# Patient Record
Sex: Female | Born: 1980 | Race: Black or African American | Hispanic: No | Marital: Married | Smoking: Never smoker
Health system: Southern US, Community
[De-identification: ages and names within clinical notes are randomized; demographics above are authoritative.]

## PROBLEM LIST (undated history)

## (undated) DIAGNOSIS — D649 Anemia, unspecified: Secondary | ICD-10-CM

## (undated) DIAGNOSIS — A6000 Herpesviral infection of urogenital system, unspecified: Secondary | ICD-10-CM

## (undated) DIAGNOSIS — E669 Obesity, unspecified: Secondary | ICD-10-CM

## (undated) DIAGNOSIS — O24419 Gestational diabetes mellitus in pregnancy, unspecified control: Secondary | ICD-10-CM

## (undated) DIAGNOSIS — R7303 Prediabetes: Secondary | ICD-10-CM

## (undated) DIAGNOSIS — E079 Disorder of thyroid, unspecified: Secondary | ICD-10-CM

## (undated) HISTORY — DX: Obesity, unspecified: E66.9

## (undated) HISTORY — PX: SALIVARY GLAND SURGERY: SHX768

## (undated) HISTORY — DX: Herpesviral infection of urogenital system, unspecified: A60.00

## (undated) HISTORY — DX: Anemia, unspecified: D64.9

## (undated) HISTORY — DX: Disorder of thyroid, unspecified: E07.9

---
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
## 2000-07-09 ENCOUNTER — Emergency Department (HOSPITAL_COMMUNITY): Admission: EM | Admit: 2000-07-09 | Discharge: 2000-07-09 | Payer: Self-pay | Admitting: Emergency Medicine

## 2001-02-28 ENCOUNTER — Other Ambulatory Visit: Admission: RE | Admit: 2001-02-28 | Discharge: 2001-02-28 | Payer: Self-pay | Admitting: Obstetrics and Gynecology

## 2002-12-13 ENCOUNTER — Emergency Department (HOSPITAL_COMMUNITY): Admission: EM | Admit: 2002-12-13 | Discharge: 2002-12-13 | Payer: Self-pay | Admitting: *Deleted

## 2003-02-05 ENCOUNTER — Other Ambulatory Visit: Admission: RE | Admit: 2003-02-05 | Discharge: 2003-02-05 | Payer: Self-pay | Admitting: Internal Medicine

## 2003-02-05 ENCOUNTER — Other Ambulatory Visit: Admission: RE | Admit: 2003-02-05 | Discharge: 2003-02-05 | Payer: Self-pay | Admitting: Obstetrics and Gynecology

## 2003-02-19 ENCOUNTER — Other Ambulatory Visit: Admission: RE | Admit: 2003-02-19 | Discharge: 2003-02-19 | Payer: Self-pay | Admitting: *Deleted

## 2003-04-24 ENCOUNTER — Ambulatory Visit (HOSPITAL_COMMUNITY): Admission: RE | Admit: 2003-04-24 | Discharge: 2003-04-25 | Payer: Self-pay | Admitting: Otolaryngology

## 2003-04-24 ENCOUNTER — Encounter (INDEPENDENT_AMBULATORY_CARE_PROVIDER_SITE_OTHER): Payer: Self-pay | Admitting: *Deleted

## 2004-10-19 ENCOUNTER — Other Ambulatory Visit: Admission: RE | Admit: 2004-10-19 | Discharge: 2004-10-19 | Payer: Self-pay | Admitting: Obstetrics and Gynecology

## 2005-04-10 ENCOUNTER — Ambulatory Visit: Payer: Self-pay | Admitting: Family Medicine

## 2005-08-02 ENCOUNTER — Ambulatory Visit: Payer: Self-pay | Admitting: Family Medicine

## 2005-08-07 ENCOUNTER — Ambulatory Visit: Payer: Self-pay | Admitting: Family Medicine

## 2005-09-12 ENCOUNTER — Ambulatory Visit: Payer: Self-pay | Admitting: Internal Medicine

## 2005-10-16 ENCOUNTER — Ambulatory Visit: Payer: Self-pay | Admitting: Family Medicine

## 2006-05-01 ENCOUNTER — Ambulatory Visit: Payer: Self-pay | Admitting: Family Medicine

## 2006-05-16 ENCOUNTER — Ambulatory Visit: Payer: Self-pay | Admitting: Family Medicine

## 2006-05-30 ENCOUNTER — Ambulatory Visit: Payer: Self-pay | Admitting: Family Medicine

## 2006-06-13 ENCOUNTER — Ambulatory Visit: Payer: Self-pay | Admitting: Family Medicine

## 2006-07-10 ENCOUNTER — Ambulatory Visit: Payer: Self-pay | Admitting: Family Medicine

## 2006-07-17 ENCOUNTER — Encounter: Admission: RE | Admit: 2006-07-17 | Discharge: 2006-07-17 | Payer: Self-pay | Admitting: Family Medicine

## 2006-08-06 ENCOUNTER — Ambulatory Visit: Payer: Self-pay | Admitting: Family Medicine

## 2006-09-18 HISTORY — PX: KELOID EXCISION: SHX1856

## 2006-11-05 DIAGNOSIS — D509 Iron deficiency anemia, unspecified: Secondary | ICD-10-CM | POA: Insufficient documentation

## 2006-11-05 DIAGNOSIS — J45909 Unspecified asthma, uncomplicated: Secondary | ICD-10-CM | POA: Insufficient documentation

## 2006-11-05 DIAGNOSIS — K119 Disease of salivary gland, unspecified: Secondary | ICD-10-CM | POA: Insufficient documentation

## 2006-11-15 ENCOUNTER — Ambulatory Visit: Payer: Self-pay | Admitting: Family Medicine

## 2007-03-11 ENCOUNTER — Ambulatory Visit: Payer: Self-pay | Admitting: Family Medicine

## 2007-03-11 DIAGNOSIS — Z6841 Body Mass Index (BMI) 40.0 and over, adult: Secondary | ICD-10-CM

## 2007-10-03 ENCOUNTER — Telehealth (INDEPENDENT_AMBULATORY_CARE_PROVIDER_SITE_OTHER): Payer: Self-pay | Admitting: Family Medicine

## 2008-02-24 ENCOUNTER — Ambulatory Visit: Payer: Self-pay | Admitting: Family Medicine

## 2008-02-24 DIAGNOSIS — N390 Urinary tract infection, site not specified: Secondary | ICD-10-CM | POA: Insufficient documentation

## 2008-02-24 LAB — CONVERTED CEMR LAB
Beta hcg, urine, semiquantitative: NEGATIVE
Bilirubin Urine: NEGATIVE
Blood in Urine, dipstick: NEGATIVE
Glucose, Urine, Semiquant: NEGATIVE
Ketones, urine, test strip: NEGATIVE
Nitrite: NEGATIVE
Protein, U semiquant: 30
Specific Gravity, Urine: 1.01
Urobilinogen, UA: NEGATIVE
WBC Urine, dipstick: NEGATIVE
pH: 5

## 2008-02-25 ENCOUNTER — Encounter: Payer: Self-pay | Admitting: Family Medicine

## 2008-02-26 ENCOUNTER — Encounter (INDEPENDENT_AMBULATORY_CARE_PROVIDER_SITE_OTHER): Payer: Self-pay | Admitting: *Deleted

## 2008-02-27 ENCOUNTER — Telehealth: Payer: Self-pay | Admitting: Family Medicine

## 2008-07-02 ENCOUNTER — Other Ambulatory Visit: Admission: RE | Admit: 2008-07-02 | Discharge: 2008-07-02 | Payer: Self-pay | Admitting: Family Medicine

## 2008-07-02 ENCOUNTER — Ambulatory Visit: Payer: Self-pay | Admitting: Family Medicine

## 2008-07-02 ENCOUNTER — Encounter: Payer: Self-pay | Admitting: Family Medicine

## 2008-07-02 LAB — CONVERTED CEMR LAB
Beta hcg, urine, semiquantitative: NEGATIVE
Bilirubin Urine: NEGATIVE
Blood in Urine, dipstick: NEGATIVE
Glucose, Urine, Semiquant: NEGATIVE
Ketones, urine, test strip: NEGATIVE
Nitrite: NEGATIVE
Protein, U semiquant: NEGATIVE
Specific Gravity, Urine: 1.02
Urobilinogen, UA: NEGATIVE
WBC Urine, dipstick: NEGATIVE
pH: 6.5

## 2008-07-03 ENCOUNTER — Encounter (INDEPENDENT_AMBULATORY_CARE_PROVIDER_SITE_OTHER): Payer: Self-pay | Admitting: *Deleted

## 2008-07-03 LAB — CONVERTED CEMR LAB

## 2008-07-06 LAB — CONVERTED CEMR LAB
ALT: 10 units/L (ref 0–35)
AST: 10 units/L (ref 0–37)
Albumin: 3.3 g/dL — ABNORMAL LOW (ref 3.5–5.2)
Alkaline Phosphatase: 122 units/L — ABNORMAL HIGH (ref 39–117)
BUN: 3 mg/dL — ABNORMAL LOW (ref 6–23)
Basophils Absolute: 0.1 10*3/uL (ref 0.0–0.1)
Basophils Relative: 0.6 % (ref 0.0–3.0)
Bilirubin, Direct: 0.1 mg/dL (ref 0.0–0.3)
CO2: 29 meq/L (ref 19–32)
Calcium: 8.9 mg/dL (ref 8.4–10.5)
Chloride: 105 meq/L (ref 96–112)
Cholesterol: 139 mg/dL (ref 0–200)
Creatinine, Ser: 0.7 mg/dL (ref 0.4–1.2)
Eosinophils Absolute: 0.1 10*3/uL (ref 0.0–0.7)
Eosinophils Relative: 0.9 % (ref 0.0–5.0)
GFR calc Af Amer: 129 mL/min
GFR calc non Af Amer: 107 mL/min
Glucose, Bld: 88 mg/dL (ref 70–99)
HCT: 38.3 % (ref 36.0–46.0)
HDL: 54.4 mg/dL (ref >39.0)
Hemoglobin: 12.3 g/dL (ref 12.0–15.0)
Iron: 96 ug/dL (ref 42–145)
LDL Cholesterol: 71 mg/dL (ref 0–99)
Lymphocytes Relative: 18.4 % (ref 12.0–46.0)
MCHC: 32.1 g/dL (ref 30.0–36.0)
MCV: 70.3 fL — ABNORMAL LOW (ref 78.0–100.0)
Monocytes Absolute: 0.7 10*3/uL (ref 0.1–1.0)
Monocytes Relative: 5.7 % (ref 3.0–12.0)
Neutro Abs: 9.6 10*3/uL — ABNORMAL HIGH (ref 1.4–7.7)
Neutrophils Relative %: 74.4 % (ref 43.0–77.0)
Platelets: 268 10*3/uL (ref 150–400)
Potassium: 3.4 meq/L — ABNORMAL LOW (ref 3.5–5.1)
RBC: 5.45 M/uL — ABNORMAL HIGH (ref 3.87–5.11)
RDW: 14.3 % (ref 11.5–14.6)
Saturation Ratios: 24.3 % (ref 20.0–50.0)
Sodium: 141 meq/L (ref 135–145)
TSH: 2.12 microintl units/mL (ref 0.35–5.50)
Total Bilirubin: 0.7 mg/dL (ref 0.3–1.2)
Total CHOL/HDL Ratio: 2.6
Total Protein: 7.4 g/dL (ref 6.0–8.3)
Transferrin: 282.6 mg/dL (ref >=212.0)
Triglycerides: 69 mg/dL (ref 0–149)
VLDL: 14 mg/dL (ref 0–40)
WBC: 12.9 10*3/uL — ABNORMAL HIGH (ref 4.5–10.5)

## 2008-07-07 ENCOUNTER — Telehealth (INDEPENDENT_AMBULATORY_CARE_PROVIDER_SITE_OTHER): Payer: Self-pay | Admitting: *Deleted

## 2008-07-14 ENCOUNTER — Ambulatory Visit: Payer: Self-pay | Admitting: Family Medicine

## 2008-07-17 ENCOUNTER — Telehealth (INDEPENDENT_AMBULATORY_CARE_PROVIDER_SITE_OTHER): Payer: Self-pay | Admitting: *Deleted

## 2008-07-17 LAB — CONVERTED CEMR LAB
Basophils Absolute: 0 10*3/uL (ref 0.0–0.1)
Basophils Relative: 0.1 % (ref 0.0–3.0)
Eosinophils Absolute: 0.1 10*3/uL (ref 0.0–0.7)
Eosinophils Relative: 1 % (ref 0.0–5.0)
HCT: 36.1 % (ref 36.0–46.0)
Hemoglobin: 11.8 g/dL — ABNORMAL LOW (ref 12.0–15.0)
Lymphocytes Relative: 23.1 % (ref 12.0–46.0)
MCHC: 32.7 g/dL (ref 30.0–36.0)
MCV: 69.9 fL — ABNORMAL LOW (ref 78.0–100.0)
Monocytes Absolute: 0.8 10*3/uL (ref 0.1–1.0)
Monocytes Relative: 5.8 % (ref 3.0–12.0)
Neutro Abs: 9.3 10*3/uL — ABNORMAL HIGH (ref 1.4–7.7)
Neutrophils Relative %: 70 % (ref 43.0–77.0)
Platelets: 274 10*3/uL (ref 150–400)
RBC: 5.16 M/uL — ABNORMAL HIGH (ref 3.87–5.11)
RDW: 13.9 % (ref 11.5–14.6)
WBC: 13.3 10*3/uL — ABNORMAL HIGH (ref 4.5–10.5)

## 2008-08-04 ENCOUNTER — Ambulatory Visit: Payer: Self-pay | Admitting: Family Medicine

## 2008-08-27 ENCOUNTER — Telehealth: Payer: Self-pay | Admitting: Family Medicine

## 2008-08-27 DIAGNOSIS — D72829 Elevated white blood cell count, unspecified: Secondary | ICD-10-CM | POA: Insufficient documentation

## 2008-08-27 LAB — CONVERTED CEMR LAB
Basophils Absolute: 0 10*3/uL (ref 0.0–0.1)
Basophils Relative: 0.1 % (ref 0.0–3.0)
Eosinophils Absolute: 0.2 10*3/uL (ref 0.0–0.7)
Eosinophils Relative: 1.3 % (ref 0.0–5.0)
HCT: 38.6 % (ref 36.0–46.0)
Hemoglobin: 12.5 g/dL (ref 12.0–15.0)
Lymphocytes Relative: 22.5 % (ref 12.0–46.0)
MCHC: 32.4 g/dL (ref 30.0–36.0)
MCV: 69.9 fL — ABNORMAL LOW (ref 78.0–100.0)
Monocytes Absolute: 0.1 10*3/uL (ref 0.1–1.0)
Monocytes Relative: 0.9 % — ABNORMAL LOW (ref 3.0–12.0)
Neutro Abs: 10.3 10*3/uL — ABNORMAL HIGH (ref 1.4–7.7)
Neutrophils Relative %: 75.2 % (ref 43.0–77.0)
Platelets: 289 10*3/uL (ref 150–400)
RBC: 5.53 M/uL — ABNORMAL HIGH (ref 3.87–5.11)
RDW: 13.7 % (ref 11.5–14.6)
WBC: 13.7 10*3/uL — ABNORMAL HIGH (ref 4.5–10.5)

## 2008-08-31 ENCOUNTER — Ambulatory Visit: Payer: Self-pay | Admitting: Hematology and Oncology

## 2008-08-31 ENCOUNTER — Encounter: Payer: Self-pay | Admitting: Family Medicine

## 2008-09-04 ENCOUNTER — Encounter: Payer: Self-pay | Admitting: Family Medicine

## 2008-09-21 ENCOUNTER — Ambulatory Visit: Payer: Self-pay | Admitting: Family Medicine

## 2008-09-21 DIAGNOSIS — R030 Elevated blood-pressure reading, without diagnosis of hypertension: Secondary | ICD-10-CM | POA: Insufficient documentation

## 2008-09-24 ENCOUNTER — Ambulatory Visit (HOSPITAL_COMMUNITY): Admission: RE | Admit: 2008-09-24 | Discharge: 2008-09-24 | Payer: Self-pay | Admitting: Hematology and Oncology

## 2008-10-02 ENCOUNTER — Encounter: Payer: Self-pay | Admitting: Family Medicine

## 2008-10-09 ENCOUNTER — Ambulatory Visit (HOSPITAL_COMMUNITY): Admission: RE | Admit: 2008-10-09 | Discharge: 2008-10-09 | Payer: Self-pay | Admitting: Hematology and Oncology

## 2009-06-03 ENCOUNTER — Ambulatory Visit: Payer: Self-pay | Admitting: Hematology & Oncology

## 2009-07-13 ENCOUNTER — Ambulatory Visit: Payer: Self-pay | Admitting: Family Medicine

## 2009-07-13 DIAGNOSIS — I517 Cardiomegaly: Secondary | ICD-10-CM | POA: Insufficient documentation

## 2009-07-22 ENCOUNTER — Ambulatory Visit (HOSPITAL_COMMUNITY): Admission: RE | Admit: 2009-07-22 | Discharge: 2009-07-22 | Payer: Self-pay | Admitting: Family Medicine

## 2009-07-22 ENCOUNTER — Ambulatory Visit: Payer: Self-pay

## 2009-07-22 ENCOUNTER — Ambulatory Visit: Payer: Self-pay | Admitting: Hematology & Oncology

## 2009-07-22 ENCOUNTER — Ambulatory Visit: Payer: Self-pay | Admitting: Internal Medicine

## 2009-07-22 ENCOUNTER — Encounter: Payer: Self-pay | Admitting: Family Medicine

## 2009-07-23 ENCOUNTER — Encounter: Payer: Self-pay | Admitting: Family Medicine

## 2009-07-23 ENCOUNTER — Ambulatory Visit: Payer: Self-pay | Admitting: Cardiovascular Disease

## 2009-07-23 DIAGNOSIS — E049 Nontoxic goiter, unspecified: Secondary | ICD-10-CM | POA: Insufficient documentation

## 2009-07-23 LAB — CBC WITH DIFFERENTIAL (CANCER CENTER ONLY)
BASO#: 0.1 10*3/uL (ref 0.0–0.2)
BASO%: 0.6 % (ref 0.0–2.0)
EOS%: 2.7 % (ref 0.0–7.0)
Eosinophils Absolute: 0.3 10*3/uL (ref 0.0–0.5)
HCT: 34.7 % — ABNORMAL LOW (ref 34.8–46.6)
HGB: 11.8 g/dL (ref 11.6–15.9)
LYMPH#: 3 10*3/uL (ref 0.9–3.3)
LYMPH%: 24.1 % (ref 14.0–48.0)
MCH: 22.9 pg — ABNORMAL LOW (ref 26.0–34.0)
MCHC: 34 g/dL (ref 32.0–36.0)
MCV: 68 fL — ABNORMAL LOW (ref 81–101)
MONO#: 0.6 10*3/uL (ref 0.1–0.9)
MONO%: 4.8 % (ref 0.0–13.0)
NEUT#: 8.4 10*3/uL — ABNORMAL HIGH (ref 1.5–6.5)
NEUT%: 67.8 % (ref 39.6–80.0)
Platelets: 321 10*3/uL (ref 145–400)
RBC: 5.14 10*6/uL (ref 3.70–5.32)
RDW: 15.5 % — ABNORMAL HIGH (ref 10.5–14.6)
WBC: 12.4 10*3/uL — ABNORMAL HIGH (ref 3.9–10.0)

## 2009-07-23 LAB — BASIC METABOLIC PANEL
BUN: 7 mg/dL (ref 6–23)
CO2: 26 mEq/L (ref 19–32)
Calcium: 8.6 mg/dL (ref 8.4–10.5)
Chloride: 107 mEq/L (ref 96–112)
Creatinine, Ser: 0.71 mg/dL (ref 0.40–1.20)
Glucose, Bld: 89 mg/dL (ref 70–99)
Potassium: 3.7 mEq/L (ref 3.5–5.3)
Sodium: 142 mEq/L (ref 135–145)

## 2009-10-12 ENCOUNTER — Ambulatory Visit: Payer: Self-pay | Admitting: Family Medicine

## 2009-10-12 DIAGNOSIS — R300 Dysuria: Secondary | ICD-10-CM | POA: Insufficient documentation

## 2009-10-12 DIAGNOSIS — M545 Low back pain, unspecified: Secondary | ICD-10-CM | POA: Insufficient documentation

## 2009-10-12 LAB — CONVERTED CEMR LAB
Bilirubin Urine: NEGATIVE
Blood in Urine, dipstick: NEGATIVE
Glucose, Urine, Semiquant: NEGATIVE
Ketones, urine, test strip: NEGATIVE
Nitrite: NEGATIVE
Protein, U semiquant: NEGATIVE
Specific Gravity, Urine: 1.015
Urobilinogen, UA: 0.2
WBC Urine, dipstick: NEGATIVE
pH: 6

## 2009-10-13 ENCOUNTER — Encounter: Payer: Self-pay | Admitting: Family Medicine

## 2009-10-14 LAB — CONVERTED CEMR LAB
Bacteria, UA: NONE SEEN
Casts: NONE SEEN /lpf
Crystals: NONE SEEN
RBC / HPF: NONE SEEN (ref <3)
WBC, UA: NONE SEEN cells/hpf (ref <3)

## 2009-12-09 ENCOUNTER — Telehealth (INDEPENDENT_AMBULATORY_CARE_PROVIDER_SITE_OTHER): Payer: Self-pay | Admitting: *Deleted

## 2009-12-14 ENCOUNTER — Ambulatory Visit: Payer: Self-pay | Admitting: Family Medicine

## 2009-12-14 DIAGNOSIS — M25539 Pain in unspecified wrist: Secondary | ICD-10-CM | POA: Insufficient documentation

## 2009-12-17 LAB — CONVERTED CEMR LAB
Free T4: 0.8 ng/dL (ref 0.6–1.6)
T3, Free: 2.8 pg/mL (ref 2.3–4.2)
TSH: 2.19 microintl units/mL (ref 0.35–5.50)

## 2009-12-21 ENCOUNTER — Encounter: Admission: RE | Admit: 2009-12-21 | Discharge: 2009-12-21 | Payer: Self-pay | Admitting: Family Medicine

## 2009-12-21 DIAGNOSIS — E041 Nontoxic single thyroid nodule: Secondary | ICD-10-CM | POA: Insufficient documentation

## 2009-12-28 ENCOUNTER — Ambulatory Visit: Payer: Self-pay | Admitting: Family Medicine

## 2009-12-28 LAB — CONVERTED CEMR LAB
Bilirubin Urine: NEGATIVE
Glucose, Urine, Semiquant: NEGATIVE
Ketones, urine, test strip: NEGATIVE
Nitrite: POSITIVE
Protein, U semiquant: NEGATIVE
Specific Gravity, Urine: 1.02
Urobilinogen, UA: 0.2
WBC Urine, dipstick: NEGATIVE
pH: 6.5

## 2009-12-29 ENCOUNTER — Encounter: Payer: Self-pay | Admitting: Family Medicine

## 2009-12-30 ENCOUNTER — Ambulatory Visit: Payer: Self-pay | Admitting: Hematology & Oncology

## 2009-12-31 ENCOUNTER — Telehealth (INDEPENDENT_AMBULATORY_CARE_PROVIDER_SITE_OTHER): Payer: Self-pay | Admitting: *Deleted

## 2010-01-04 ENCOUNTER — Telehealth: Payer: Self-pay | Admitting: Family Medicine

## 2010-01-10 ENCOUNTER — Encounter: Payer: Self-pay | Admitting: Family Medicine

## 2010-06-27 ENCOUNTER — Encounter: Admission: RE | Admit: 2010-06-27 | Discharge: 2010-06-27 | Payer: Self-pay | Admitting: Otolaryngology

## 2010-10-07 ENCOUNTER — Ambulatory Visit
Admission: RE | Admit: 2010-10-07 | Discharge: 2010-10-07 | Payer: Self-pay | Source: Home / Self Care | Attending: Family Medicine | Admitting: Family Medicine

## 2010-10-07 ENCOUNTER — Encounter: Payer: Self-pay | Admitting: Family Medicine

## 2010-10-09 ENCOUNTER — Encounter: Payer: Self-pay | Admitting: Hematology & Oncology

## 2010-10-18 NOTE — Assessment & Plan Note (Signed)
Summary: Phentermine refill/drb   Vital Signs:  Patient profile:   30 year old female Weight:      264 pounds Pulse rate:   87 / minute Pulse rhythm:   regular BP sitting:   122 / 80  (left arm) Cuff size:   large  Vitals Entered By: Army Fossa CMA (December 14, 2009 3:56 PM) CC: Pt here to start Phentermine again.   History of Present Illness: Pt here to start med for weight loss again.  Pt is exercising again but is not eating right or drinking water.  Pt also c/o L wrist pain after FOOSH last weekend.  Pt went to UC and they told her it was not broken but she still has a lot of pain.  Current Medications (verified): 1)  Diethylpropion Hcl Cr 75 Mg Xr24h-Tab (Diethylpropion Hcl) .[name].. 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 ASTHMA (ICD-493.90) ANEMIA-IRON DEFICIENCY (ICD-280.9)  Past Surgical History: Last updated: 07/02/2008 keloid removed 2008 salivary gland removed secondary to cancer---Dr Molli Barrows  Family History: Last updated: 07/02/2008 Family History of CAD Female 1st degree relative <50 Family History Diabetes 1st degree relative Family History High cholesterol Family History Hypertension Family History of Prostate CA 1st degree relative <50  Social History: Last updated: 07/02/2008 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 Family History of CAD Female 1st degree relative <50 Family History Diabetes 1st degree relative Family History High cholesterol Family History Hypertension Family History of Prostate CA 1st degree relative <50  Social History: Reviewed history from 07/02/2008 and no changes required. 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 Lungs are clear to auscultation, no crackles or wheezes. Heart:  normal rate and no murmur.   Msk:  L wrist swollen medially-- tenderness with palpation Extremities:  No clubbing, cyanosis, edema, or deformity noted with normal full range of motion of all joints.   Psych:  Cognition and judgment appear intact. Alert and cooperative with normal attention span and concentration. No apparent delusions, illusions, hallucinations   Impression & Recommendations:  Problem # 1:  WRIST PAIN, LEFT (ICD-719.43)  Orders: T-Wrist Comp Left Min 3 Views (73110TC) con't to wear splint to ortho if no improvement   Problem # 2:  THYROMEGALY (ICD-240.9)  Orders: Venipuncture (09811) TLB-TSH (Thyroid Stimulating Hormone) (84443-TSH) TLB-T3, Free (Triiodothyronine) (84481-T3FREE) TLB-T4 (Thyrox), Free [phone]) Radiology Referral (Radiology)  Problem # 3:  OBESITY NOS (ICD-278.00) diet and exercise discussed with patient tenuate qam  rto 1 month Orders: Venipuncture (21308) TLB-TSH (Thyroid  Stimulating Hormone) (84443-TSH) TLB-T3, Free (Triiodothyronine) (84481-T3FREE) TLB-T4 (Thyrox), Free (84439-FT4R)  Ht: 66.5 (07/23/2009)  Wt: 264 (12/14/2009)   BMI: 40.77 (07/13/2009)  Complete Medication List: 1)  Diethylpropion Hcl Cr 75 Mg Xr24h-tab (Diethylpropion hcl) .[name].. 1 by mouth qam Prescriptions: DIETHYLPROPION HCL CR 75 MG XR24H-TAB (DIETHYLPROPION HCL) 1 by mouth qam  #30 x 0   Entered and Authorized by:   Loreen Freud DO   Signed by:   Loreen Freud DO on 12/14/2009   Method used:   Print then Give to Patient   RxID:   [id number]

## 2010-10-18 NOTE — Letter (Signed)
[facility]   Imported By: Lanelle Bal 09/15/2009 12:14:59  _____________________________________________________________________  External Attachment:    Type:   Image     Comment:   External Document

## 2010-10-18 NOTE — Progress Notes (Signed)
Summary: heme referral  Phone Note Outgoing Call   Summary of Call: left message to call  office  WBC still elevated-- does pt f/u with Heme/onc anywhere?  If no pt needs referral to Hematology Initial call taken by: [facility] CMA,  August 27, 2008 10:55 AM  Follow-up for Phone Call        pt aware, pt does not have appt with heme. pt would like to have referral done. Follow-up by: Jeremy Johann CMA,  August 28, 2008 8:49 AM  Additional Follow-up for Phone Call Additional follow up Details #1::        dr Laury Axon pls advise on referral Additional Follow-up by: [facility] CMA,  August 28, 2008 8:50 AM  New Problems: LEUKOCYTOSIS UNSPECIFIED (ICD-288.60)   Additional Follow-up for Phone Call Additional follow up Details #2::    done Follow-up by: Loreen Freud DO,  August 28, 2008 10:12 AM  Additional Follow-up for Phone Call Additional follow up Details #3:: Details for Additional Follow-up Action Taken: pt aware  Additional Follow-up by: Jeremy Johann CMA,  August 28, 2008 12:31 PM  New Problems: LEUKOCYTOSIS UNSPECIFIED (ICD-288.60)

## 2010-10-18 NOTE — Progress Notes (Signed)
Summary: rx - dr Laury Axon  Phone Note Call from Patient Call back at [phone]   Caller: Patient Summary of Call: patient was told to try monistat but it isnt helping - wants rx for diflucan- [facility] [address] Initial call taken by: Okey Regal [place],  February 27, 2008 11:41 AM  Follow-up for Phone Call        DR.LOWNE PLEASE ADVISE./Chrae [facility]  February 27, 2008 12:26 PM   Additional Follow-up for Phone Call Additional follow up Details #1::        diflucan 150 mg #2 1 by mouth x1 may repeat in 1 week as needed    Additional Follow-up by: Loreen Freud DO,  February 27, 2008 12:29 PM    Additional Follow-up for Phone Call Additional follow up Details #2::    PATIENT AWARE RX SENT./Chrae Malloy  February 27, 2008 12:50 PM   New/Updated Medications: DIFLUCAN 150 MG  TABS (FLUCONAZOLE) 1 by mouth X ONCE ONLY, MAY REPEAT IN 1 WEEK IF NEEDED   Prescriptions: DIFLUCAN 150 MG  TABS (FLUCONAZOLE) 1 by mouth X ONCE ONLY, MAY REPEAT IN 1 WEEK IF NEEDED  #2 x 0   Entered by:   Shonna Chock   Authorized by:   Loreen Freud DO   Signed by:   Shonna Chock on 02/27/2008   Method used:   Electronically [facility] to ...       [facility] [phone][facility]       [address]       [place], [place]  [address]       Ph: [phone]       Fax: [phone]   RxID:   [id number]

## 2010-10-18 NOTE — Progress Notes (Signed)
Summary: yeast infection   Phone Note Call from Patient Call back at Home Phone [phone]   Caller: Patient Summary of Call: patient taking antibiotic for uti she a has yeast infection - itching & burning - wants rx [facility] diflucan [facility] [address] Initial call taken by: Okey Regal [place],  January 04, 2010 2:45 PM  Follow-up for Phone Call        pls advise............Marland KitchenFelecia Deloach CMA  January 04, 2010 2:46 PM   Additional Follow-up for Phone Call Additional follow up Details #1::        diflucan 150  #2 1 by mouth once daily x1,  may repeat in 3 days as needed  Additional Follow-up by: Loreen Freud DO,  January 04, 2010 4:25 PM    Additional Follow-up for Phone Call Additional follow up Details #2::    pt aware rx sent to pharmacy..................Marland KitchenFelecia Deloach CMA  January 04, 2010 4:46 PM   New/Updated Medications: DIFLUCAN 150 MG TABS (FLUCONAZOLE) Take 1 by mouth once daily x1,  may repeat in 3 days as needed Prescriptions: DIFLUCAN 150 MG TABS (FLUCONAZOLE) Take 1 by mouth once daily x1,  may repeat in 3 days as needed  #2 x 0   Entered by:   Jeremy Johann CMA   Authorized by:   Loreen Freud DO   Signed by:   Jeremy Johann CMA on 01/04/2010   Method used:   Faxed to ...       [facility] [phone]* (retail)       [address]       [place], [place]  [address]       Ph: [phone]       Fax: [phone]   RxID:   [id number]

## 2010-10-18 NOTE — Letter (Signed)
Summary: Results Follow-up Letter  [place] [facility]  [address] [place], [place] [address]   Phone: [phone]  Fax: [phone]    07/03/2008        [name] [address] [place], [place]  [address]  Dear Ms. Todt,   The following are the results of your recent test(s):  Test     Result     Pap Smear    Normal_______  Not Normal_____       Comments: _________________________________________________________ Cholesterol LDL(Bad cholesterol):          Your goal is less than:         HDL (Good cholesterol):        Your goal is more than: _________________________________________________________ Other Tests:   _________________________________________________________  Please call for an appointment Or ___PLEASE SEE ATTACHED LABWORK_____________________________________________________ _________________________________________________________ _________________________________________________________  Sincerely,  Ardyth Man [place] [facility]

## 2010-10-18 NOTE — Letter (Signed)
Summary: Consult Form/[facility]  Consult Form/[facility]   Imported By: Lanelle Bal 09/03/2008 10:00:49  _____________________________________________________________________  External Attachment:    Type:   Image     Comment:   External Document

## 2010-10-18 NOTE — Progress Notes (Signed)
Summary: Urine Results   Phone Note Outgoing Call   Call placed by: Army Fossa CMA,  December 31, 2009 10:16 AM Summary of Call: Regarding urine culture results, LMTCB:  culture negative---- col < 100,000 if still symptomatic--- recheck UA C&S Signed by Loreen Freud DO on 12/31/2009 at 10:14 AM  Follow-up for Phone Call        Pt is aware. she is doing better.Army Fossa CMA  December 31, 2009 11:22 AM

## 2010-10-18 NOTE — Consult Note (Signed)
Summary: [facility]  [facility]   Imported By: Lanelle Bal 01/17/2010 10:41:51  _____________________________________________________________________  External Attachment:    Type:   Image     Comment:   External Document

## 2010-10-18 NOTE — Assessment & Plan Note (Signed)
Summary: np6/cardiomegaly/jml   Visit Type:  np 6mos Primary Provider:  Laury Axon  CC:  no complaits.  History of Present Illness: [name] is seen today at the request of Dr. Laury Axon for cardiomegaly.  This was suggested by CXR.  the patient denies a history of cardiac problems.  Family history is positive for premature CAD but not DCM, HOCM, or congenital heart disease I reviewed the echo she had yesterday.  It was normal with no cardiomegaly, no LVH, no valvular heart disease or effusion.  She is asymptomatic.  I reassured her about her heart and the CXR technque likely magnified her heart size particularly given her body hapitus.  Problems Prior to Update: 1)  Cardiomegaly, Mild  (ICD-429.3) 2)  Preventive Health Care  (ICD-V70.0) 3)  Elevated Bp Reading Without Dx Hypertension  (ICD-796.2) 4)  Leukocytosis Unspecified  (ICD-288.60) 5)  Preventive Health Care  (ICD-V70.0) 6)  Family History Diabetes 1st Degree Relative  (ICD-V18.0) 7)  Family History of Cad Female 1st Degree Relative <50  (ICD-V17.3) 8)  Uti  (ICD-599.0) 9)  Obesity Nos  (ICD-278.00) 10)  Disease, Salivary Gland Nos  (ICD-527.9) 11)  Asthma  (ICD-493.90) 12)  Anemia-iron Deficiency  (ICD-280.9)  Current Problems (verified): 1)  Cardiomegaly, Mild  (ICD-429.3) 2)  Preventive Health Care  (ICD-V70.0) 3)  Elevated Bp Reading Without Dx Hypertension  (ICD-796.2) 4)  Leukocytosis Unspecified  (ICD-288.60) 5)  Preventive Health Care  (ICD-V70.0) 6)  Family History Diabetes 1st Degree Relative  (ICD-V18.0) 7)  Family History of Cad Female 1st Degree Relative <50  (ICD-V17.3) 8)  Uti  (ICD-599.0) 9)  Obesity Nos  (ICD-278.00) 10)  Disease, Salivary Gland Nos  (ICD-527.9) 11)  Asthma  (ICD-493.90) 12)  Anemia-iron Deficiency  (ICD-280.9)  Current Medications (verified): 1)  Kariva 0.15-0.02/0.01 Mg (21/5) Tabs (Desogestrel-Ethinyl Estradiol) .... Hold 2)  Proair Hfa 108 (90 Base) Mcg/act Aers (Albuterol Sulfate) .... 2  Puffs Qid As Needed 3)  Advair Diskus 250-50 Mcg/dose Aepb (Fluticasone-Salmeterol) .[name].. 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 ASTHMA (ICD-493.90) ANEMIA-IRON DEFICIENCY (ICD-280.9)  Past Surgical History: Last updated: 07/02/2008 keloid removed 2008 salivary gland removed secondary to cancer---Dr Molli Barrows  Family History: Last updated: 07/02/2008 Family History of CAD Female 1st degree relative <50 Family History Diabetes 1st degree relative Family History High cholesterol Family History Hypertension Family History of Prostate CA 1st degree relative <50  Social History: Last updated: 07/02/2008 Occupation:  Social research officer, 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 All other systems reviewed and negative  Vital Signs:  Patient profile:   30 year old female Height:      66.5 inches Weight:      250 pounds Pulse rate:   84 / minute BP sitting:   114 / 71  (left arm) Cuff size:   large  Vitals Entered By: Oswald Hillock (July 23, 2009 3:12 PM)  Physical Exam  General:  Affect appropriate Healthy:  appears stated age HEENT: normal Neck supple with no adenopathy JVP normal no bruits no thyromegaly Lungs clear with no wheezing and good diaphragmatic  motion Heart:  S1/S2 no murmur,rub, gallop or click PMI normal Abdomen: benighn, BS positve, no tenderness, no AAA no bruit.  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  ECG shows LVH.  Told patient to monitor BP 2-3x/month No need for further w/u  Problem # 2:  THYROMEGALY (ICD-240.9) F/U Dr. Laury Axon make sure TSH and T4 checked recently   EKG Report  Procedure date:  07/23/2009  Findings:      NSR 84 Voltage criteria for LVH Abnormal ECG

## 2010-10-18 NOTE — Assessment & Plan Note (Signed)
Summary: FOR ASTNMA PROBLEM//PH   Vital Signs:  Patient profile:   30 year old female Height:      66.5 inches Weight:      255.50 pounds BMI:     40.77 Temp:     98.0 degrees F oral Pulse rate:   82 / minute Pulse rhythm:   regular BP sitting:   126 / 88  (left arm) Cuff size:   large  Vitals Entered By: Army Fossa CMA (July 13, 2009 2:10 PM) CC: Has had a cough for 2 weeks. Cough is steady through out the day.    History of Present Illness:  Cough      This is a 30 year old woman who presents with Cough.  The symptoms began 2 weeks ago.  The patient reports non-productive cough and wheezing, but denies productive cough, pleuritic chest pain, shortness of breath, exertional dyspnea, fever, hemoptysis, and malaise.  The patient denies the following symptoms: cold/URI symptoms, sore throat, nasal congestion, chronic rhinitis, weight loss, acid reflux symptoms, and peripheral edema.  The cough is worse with activity.  Ineffective prior treatments have included albuterol inhaler.  Risk factors include history of asthma.    Current Medications (verified): 1)  Kariva 0.15-0.02/0.01 Mg (21/5) Tabs (Desogestrel-Ethinyl Estradiol) .... As Directed 2)  Proair Hfa 108 (90 Base) Mcg/act Aers (Albuterol Sulfate) .... 2 Puffs Qid As Needed 3)  Advair Diskus 250-50 Mcg/dose Aepb (Fluticasone-Salmeterol) .[name].. 1 Inh Two Times A Day  Allergies (verified): No Known Drug Allergies  Past History:  Past medical, surgical, family and social histories (including risk factors) reviewed for relevance to current acute and chronic problems.  Past Medical History: Reviewed history from 07/02/2008 and no changes required. 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  keloid removed 2008 salivary gland removed secondary to cancer---Dr Molli Barrows  Family History: Reviewed history from 07/02/2008 and no changes required. Family History of CAD Female 1st degree relative <50 Family History Diabetes 1st degree relative Family History High cholesterol Family History Hypertension Family History of Prostate CA 1st degree relative <50  Social History: Reviewed history from 07/02/2008 and no changes required. 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  Otoscopic examination reveals clear canals, tympanic membranes are intact bilaterally without bulging, retraction, inflammation or discharge. Hearing is grossly normal bilaterally. Nose:  External nasal examination shows no deformity or inflammation. Nasal mucosa are pink and moist without lesions or exudates. Mouth:  Oral mucosa and oropharynx without lesions or exudates.  Teeth in good repair. Neck:  No deformities, masses, or tenderness noted. Lungs:  R decreased breath sounds and L decreased breath sounds.  Improved BS after neb Heart:  normal rate and no murmur.   Psych:  Oriented X3 and normally interactive.     Impression & Recommendations:  Problem # 1:  ASTHMA (ICD-493.90)  Her updated medication list for this problem includes:    Proair Hfa 108 (90 Base) Mcg/act Aers (Albuterol sulfate) .[name]... 2 puffs qid as needed    Advair Diskus 250-50 Mcg/dose Aepb (Fluticasone-salmeterol) .[name]... 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 As directed 2)  Proair Hfa 108 (90 Base) Mcg/act Aers (Albuterol sulfate) .... 2 puffs qid as needed 3)  Advair Diskus 250-50 Mcg/dose Aepb (Fluticasone-salmeterol) .[name].. 1 inh two times a day  Other Orders: Admin 1st Vaccine (16010) Flu Vaccine 76yrs + (93235) Prescriptions: ADVAIR DISKUS 250-50 MCG/DOSE AEPB (FLUTICASONE-SALMETEROL) 1 inh two times a day  #1 x 5   Entered and Authorized by:   Loreen Freud DO   Signed by:   Loreen Freud DO on 07/13/2009   Method used:   [facility]        [facility] [phone]* (retail)       [address]       [place], [place]  [address]       Ph: [phone]       Fax: [phone]   RxID:   [id number] PROAIR HFA 108 (90 BASE) MCG/ACT AERS (ALBUTEROL SULFATE) 2 puffs qid as needed  #1 x 2   Entered and Authorized by:   Loreen Freud DO   Signed by:   Loreen Freud DO on 07/13/2009   Method used:   Electronically to        [facility] [phone]* (retail)       [address]       [place], [place]  [address]       Ph: [phone]       Fax: [phone]   RxID:   [id number]  Flu Vaccine Consent Questions     Do you have a history of severe allergic reactions to this vaccine? no    Any prior history of allergic reactions to egg and/or gelatin? no    Do you have a sensitivity to the preservative Thimersol? no    Do you have a past history of Guillan-Barre Syndrome? no    Do you currently have an acute febrile illness? no    Have you ever had a severe reaction to latex? no    Vaccine information given and explained to patient? yes    Are you currently pregnant? no    Lot Number:[id number]   Exp Date:03/17/2010   Site Given  Right Deltoid IM   RxID:   [id number]   .lbflu

## 2010-10-18 NOTE — Assessment & Plan Note (Signed)
Summary: f/u//tl   Vital Signs:  Patient Profile:   30 Years Old Female Weight:      244.38 pounds Temp:     98.5 degrees F oral Pulse rate:   74 / minute Resp:     18 per minute BP sitting:   110 / 70  (right arm)  Pt. in pain?   no  Vitals Entered By: Ardyth Man (March 11, 2007 2:04 PM)               Prescriptions: PHENTERMINE HCL 37.5 MG  CAPS (PHENTERMINE HCL) Take one tablet daily  #30 x 0   Entered by:   Ardyth Man   Authorized by:   Loreen Freud DO   Signed by:   Ardyth Man on 03/11/2007   Method used:   Print then Give to Patient   RxID:   [id number] PHENTERMINE HCL 37.5 MG  CAPS (PHENTERMINE HCL) Take one tablet daily  #30 x 0   Entered by:   Ardyth Man   Authorized by:   Loreen Freud DO   Signed by:   Ardyth Man on 03/11/2007   Method used:   Print then Give to Patient   RxID:   [id number]    PCP:  Laury Axon  Chief Complaint:  Last Gardicil and med refill.  History of Present Illness: Pt here for 3rd HPV  and refill on Fastin.  NO complaints.  Pt has been stressed lately.  Working on [employer] for school.     Current Allergies: No known allergies      Review of Systems      See HPI   Physical Exam  General:     Well-developed,well-nourished,in no acute distress; alert,appropriate and cooperative throughout examination Lungs:     Normal respiratory effort, chest expands symmetrically. Lungs are clear to auscultation, no crackles or wheezes. Heart:     normal rate, regular rhythm, and no murmur.  normal rate, regular rhythm, and no murmur.      Impression & Recommendations:  Problem # 1:  OBESITY NOS (ICD-278.00) Assessment: Unchanged refill Fastin 37.5 RTO 1 month d/w pt diet and exercise  Medications Added to Medication List This Visit: 1)  Phentermine Hcl 37.5 Mg Caps (Phentermine hcl) .... Take one tablet daily  Other Orders: HPV Vaccine - 3 sched doses - IM (93235) Admin 1st Vaccine (57322)       HPV # 3    Vaccine Type: Gardasil    Site: left deltoid    Mfr: Merck    Dose: 0.5 ml    Route: IM    Given by: Ardyth Man    Exp. Date: 06/23/2009    Lot #: [id number]     VIS given: 10/20/05 version given March 11, 2007.

## 2010-10-18 NOTE — Letter (Signed)
Summary: Results Follow-up Letter  [place] [facility]  [address] [place], [place] [address]   Phone: [phone]  Fax: [phone]    02/26/2008        Shawnta D. Skellenger [address] [place], [place]  [address]  Dear Ms. Dietrick,   The following are the results of your recent test(s):  Test     Result     Pap Smear    Normal_______  Not Normal_____       Comments: _________________________________________________________ Cholesterol LDL(Bad cholesterol):          Your goal is less than:         HDL (Good cholesterol):        Your goal is more than: _________________________________________________________ Other Tests:   _________________________________________________________  Please call for an appointment Or __Please see attached._______________________________________________________ _________________________________________________________ _________________________________________________________  Sincerely,  Ardyth Man [place] at [facility]

## 2010-10-18 NOTE — Assessment & Plan Note (Signed)
Summary: CPX//PH   Vital Signs:  Patient Profile:   30 Years Old Female Height:     66.5 inches Weight:      245.0 pounds BMI:     39.09 Temp:     98.4 degrees F oral Pulse rate:   80 / minute Resp:     18 per minute BP sitting:   120 / 90  (left arm)  Pt. in pain?   no  Vitals Entered By: Jeremy Johann CMA (September 21, 2008 1:11 PM)                  PCP:  Laury Axon  Chief Complaint:  cpx.  History of Present Illness: Pt here for CPE only.  Labs reviewed.  No complaints.  Pt saw Hem/onc for WBC and is in process of being worked up.      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       ASTHMA (ICD-493.90)       ANEMIA-IRON DEFICIENCY (ICD-280.9)         Past Surgical History:    Reviewed history from 07/02/2008 and no changes required:       keloid removed 2008       salivary gland removed secondary to cancer---Dr Molli Barrows   Family History:    Reviewed history from 07/02/2008 and no changes required:       Family History of CAD Female 1st degree relative <50       Family History Diabetes 1st degree relative       Family History High cholesterol       Family History Hypertension       Family History of Prostate CA 1st degree relative <50  Social History:    Reviewed history from 07/02/2008 and no changes required:       Occupation:  Social research officer, [employer], Environmental Biology       Single       Never Smoked       Alcohol use-yes       Drug use-no       Regular exercise-no   Risk Factors:  HIV high-risk behavior:  yes    Comments:  sexually active 1 person--- occas.  condoms    Has patient --       Felt need to cut down:  no       Been annoyed by complaints:  no       Felt guilty about drinking:   no       Needed eye opener in the morning:  no    Counseled to quit/cut down alcohol use:  no  Family History Risk Factors:    Family History of MI in females < 21 years old:  no    Family History of MI in males < 11 years old:  no   Review of Systems      See HPI  General      Denies chills, fatigue, fever, loss of appetite, malaise, sleep disorder, sweats, weakness, and weight loss.  Eyes      Denies blurring, discharge, double vision, eye irritation, eye pain, halos, itching, light sensitivity,  red eye, vision loss-1 eye, and vision loss-both eyes.      [id number]  ENT      Denies decreased hearing, difficulty swallowing, ear discharge, earache, hoarseness, nasal congestion, nosebleeds, postnasal drainage, ringing in ears, sinus pressure, and sore throat.      dentist--due ent-parotid  CV      Denies bluish discoloration of lips or nails, chest pain or discomfort, difficulty breathing at night, difficulty breathing while lying down, fainting, fatigue, leg cramps with exertion, lightheadness, near fainting, palpitations, shortness of breath with exertion, swelling of feet, swelling of hands, and weight gain.  Resp      Denies chest discomfort, chest pain with inspiration, cough, coughing up blood, excessive snoring, hypersomnolence, morning headaches, pleuritic, shortness of breath, sputum productive, and wheezing.  GI      Denies abdominal pain, bloody stools, change in bowel habits, constipation, dark tarry stools, diarrhea, excessive appetite, gas, hemorrhoids, indigestion, loss of appetite, nausea, vomiting, vomiting blood, and yellowish skin color.  GU      Denies abnormal vaginal bleeding, decreased libido, discharge, dysuria, genital sores, hematuria, incontinence, nocturia, urinary frequency, and urinary hesitancy.  MS      Denies joint pain, joint redness, joint swelling, loss of strength, low back pain, mid back pain, muscle aches, muscle , cramps, muscle weakness,  stiffness, and thoracic pain.  Derm      Denies changes in color of skin, changes in nail beds, dryness, excessive perspiration, flushing, hair loss, insect bite(s), itching, lesion(s), poor wound healing, and rash.  Neuro      Denies brief paralysis, difficulty with concentration, disturbances in coordination, falling down, headaches, inability to speak, memory loss, numbness, poor balance, seizures, sensation of room spinning, tingling, tremors, visual disturbances, and weakness.  Psych      Denies alternate hallucination ( auditory/visual), anxiety, depression, easily angered, easily tearful, irritability, mental problems, panic attacks, sense of great danger, suicidal thoughts/plans, thoughts of violence, unusual visions or sounds, and thoughts /plans of harming others.  Endo      Denies cold intolerance, excessive hunger, excessive thirst, excessive urination, heat intolerance, polyuria, and weight change.  Heme      Heme--elevated WBC  Allergy      Denies hives or rash, itching eyes, persistent infections, seasonal allergies, and sneezing.   Physical Exam  General:     Well-developed,well-nourished,in no acute distress; alert,appropriate and cooperative throughout examination Head:     Normocephalic and atraumatic without obvious abnormalities. No apparent alopecia or balding. Eyes:     vision grossly intact, pupils equal, pupils round, and pupils reactive to light.   Ears:     External ear exam shows no significant lesions or deformities.  Otoscopic examination reveals clear canals, tympanic membranes are intact bilaterally without bulging, retraction, inflammation or discharge. Hearing is grossly normal bilaterally. Nose:     External nasal examination shows no deformity or inflammation. Nasal mucosa are pink and moist without lesions or exudates. Mouth:     Oral mucosa and oropharynx without lesions or exudates.  Teeth in good repair. Neck:     No deformities, masses, or  tenderness noted. Lungs:     Normal respiratory effort, chest expands symmetrically. Lungs are clear to auscultation, no crackles or wheezes. Heart:     normal rate, regular rhythm, and no murmur.   Abdomen:     Bowel sounds positive,abdomen soft and non-tender without masses, organomegaly or hernias noted. Msk:     normal ROM, no joint tenderness, no joint swelling, no  joint warmth, no redness over joints, no joint deformities, no joint instability, and no crepitation.   Pulses:     R posterior tibial normal, R dorsalis pedis normal, R carotid normal, L popliteal normal, L posterior tibial normal, and L dorsalis pedis normal.   Extremities:     No clubbing, cyanosis, edema, or deformity noted with normal full range of motion of all joints.   Neurologic:     No cranial nerve deficits noted. Station and gait are normal. Plantar reflexes are down-going bilaterally. DTRs are symmetrical throughout. Sensory, motor and coordinative functions appear intact. Skin:     Intact without suspicious lesions or rashes Cervical Nodes:     No lymphadenopathy noted Psych:     Cognition and judgment appear intact. Alert and cooperative with normal attention span and concentration. No apparent delusions, illusions, hallucinations    Impression & Recommendations:  Problem # 1:  PREVENTIVE HEALTH CARE (ICD-V70.0) labs reviewed with pt  ghm utd rto 1 year  Problem # 2:  LEUKOCYTOSIS UNSPECIFIED (ICD-288.60) per heme  Problem # 3:  ELEVATED BP READING WITHOUT DX HYPERTENSION (ICD-796.2) pt will have nurse at school check bp and call with results. BP today: 120/90 Prior BP: 130/70 (07/02/2008)  Labs Reviewed: Creat: 0.7 (07/02/2008) Chol: 139 (07/02/2008)   HDL: 54.4 (07/02/2008)   LDL: 71 (07/02/2008)   TG: 69 (07/02/2008)  Instructed in low sodium diet (DASH Handout) and behavior modification.    Complete Medication List: 1)  Kariva 0.15-0.02/0.01 Mg (21/5) Tabs (Desogestrel-ethinyl  estradiol) .... As directed    Prescriptions: KARIVA 0.15-0.02/0.01 MG (21/5) TABS (DESOGESTREL-ETHINYL ESTRADIOL) as directed  #3 x 3   Entered and Authorized by:   Loreen Freud DO   Signed by:   Loreen Freud DO on 09/21/2008   Method used:   Print then Give to Patient   RxID:   [id number]  ]

## 2010-10-18 NOTE — Assessment & Plan Note (Signed)
Summary: weight loss--n/s--tl   Vital Signs:  Patient Profile:   30 Years Old Female Weight:      250.50 pounds Temp:     98.4 degrees F oral Pulse rate:   76 / minute Resp:     18 per minute BP sitting:   140 / 90  (right arm)  Pt. in pain?   no  Vitals Entered By: Ardyth Man (February 24, 2008 12:27 PM)                  PCP:  Laury Axon  Chief Complaint:  Patient would like to try Phentermine.  History of Present Illness: Pt here for wt loss.  She was on phenteramine 1x and she would like to try it again.  Pt not really on a diet now.-- trying to follow low carb high proteing diet. Pt would also like to go on BCP again.  She will schedule her cpe.    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  CV      Denies bluish discoloration of lips or nails, chest pain or discomfort, difficulty breathing at night, difficulty breathing while lying down, fainting, fatigue, leg cramps with exertion, lightheadness, near fainting, palpitations, shortness of breath with exertion, swelling of feet, swelling of hands, and weight gain.  Resp      Denies chest discomfort, chest pain with inspiration, cough, coughing up blood, excessive snoring, hypersomnolence, morning headaches, pleuritic, shortness of breath, sputum productive, and wheezing.  GI      Denies abdominal pain, bloody stools, change in bowel habits, constipation, dark tarry stools, diarrhea, excessive appetite, gas, hemorrhoids, indigestion, loss of appetite, nausea,  vomiting, vomiting blood, and yellowish skin color.  GU      Denies abnormal vaginal bleeding, decreased libido, dysuria, genital sores, hematuria, incontinence, nocturia, urinary frequency, and urinary hesitancy.   Physical Exam  General:     Well-developed,well-nourished,in no acute distress; alert,appropriate and cooperative throughout examination Neck:     No deformities, masses, or tenderness noted.no cervical lymphadenopathy.   Lungs:     Normal respiratory effort, chest expands symmetrically. Lungs are clear to auscultation, no crackles or wheezes. Heart:     normal rate, regular rhythm, and no murmur.   Abdomen:     Bowel sounds positive,abdomen soft and non-tender without masses, organomegaly or hernias noted.  obese    Impression & Recommendations:  Problem # 1:  OBESITY NOS (ICD-278.00)  Orders: Nutrition Referral (Nutrition) discussed diet and exercise with pt  Problem # 2:  UTI (ICD-599.0)  Orders: T-Culture, Urine ([id number])  Encouraged to push clear liquids, get enough rest, and take acetaminophen as needed. To be seen in 10 days if no improvement, sooner if worse.   Complete Medication List: 1)  Adipex-p 37.5 Mg Tabs (Phentermine hcl) .[name].. 1 by  mouth qam 2)  Mircette 0.15-0.02/0.01 Mg (21/5) Tabs (Desogestrel-ethinyl estradiol) .... As directed    Prescriptions: MIRCETTE 0.15-0.02/0.01 MG (21/5)  TABS (DESOGESTREL-ETHINYL ESTRADIOL) as directed  #1 x 5   Entered and Authorized by:   Loreen Freud DO   Signed by:   Loreen Freud DO on 02/24/2008   Method used:   Electron[facility] to ...       [facility] [phone]*       [address]       [place], [place]  [address]       Ph: [phone]       Fax: [phone]   RxID:   [id number] ADIPEX-P 37.5 MG  TABS (PHENTERMINE HCL) 1 by mouth qam  #30 x 0   Entered and Authorized by:   Loreen Freud DO   Signed by:   Loreen Freud DO on 02/24/2008   Method used:    Print then Give to Patient   RxID:   [id number]  ] Laboratory Results   Urine Tests  Date/Time Recieved: Ardyth Man  February 24, 2008 12:50 PM  Date/Time Reported: Ardyth Man  February 24, 2008 12:50 PM   Routine Urinalysis   Color: orange Appearance: Cloudy Glucose: negative   (Normal Range: Negative) Bilirubin: negative   (Normal Range: Negative) Ketone: negative   (Normal Range: Negative) Spec. Gravity: 1.010   (Normal Range: 1.003-1.035) Blood: negative   (Normal Range: Negative) pH: 5.0   (Normal Range: 5.0-8.0) Protein: 30   (Normal Range: Negative) Urobilinogen: negative   (Normal Range: 0-1) Nitrite: negative   (Normal Range: Negative) Leukocyte Esterace: negative   (Normal Range: Negative)    Urine HCG: negative

## 2010-10-18 NOTE — Letter (Signed)
Summary: [facility]  [facility]   Imported By: Lanelle Bal 10/19/2008 12:23:42  _____________________________________________________________________  External Attachment:    Type:   Image     Comment:   External Document

## 2010-10-18 NOTE — Miscellaneous (Signed)
Summary: HIV Testing Consent/[facility]  HIV Testing Consent/[facility]   Imported By: Lanelle Bal 07/06/2008 10:03:55  _____________________________________________________________________  External Attachment:    Type:   Image     Comment:   External Document

## 2010-10-18 NOTE — Progress Notes (Signed)
Summary: [facility] 07/07/08  Phone Note Outgoing Call Call back at [facility] Phone [phone]   Call placed by: Ardyth Man,  July 07, 2008 7:35 AM Call placed to: Patient Summary of Call: need to repeat CBCD 288.8--- WBC elevated __________________________________________________________ + fungus---diflucan 150 #2  1 by mouth once daily x1,  may repeat in 1 week as needed  All HIV testing Negative  **Left message for patient to call office ***  Follow-up for Phone Call        Patient aware and lab scheduled for Tuesday Oct. 27, 2009 at 2pm Ardyth Man  July 07, 2008 9:34 AM  Follow-up by: Ardyth Man,  July 07, 2008 9:34 AM

## 2010-10-18 NOTE — Progress Notes (Signed)
Summary: NEEDS REFILL FOR SENTERAMINE  Phone Note Call from Patient Call back at [facility] Phone [phone]   Caller: Patient Summary of Call: NEEDS REFILL FOR SENTERAMINE (WEIGHT LOSS MEDICINE) ----DOES SHE NEED AN APPOINTMENT OR CAN [facility]?  PLEASE CALL HER WITH ANSWER Initial call taken by: Jerolyn Shin,  December 09, 2009 4:01 PM  Follow-up for Phone Call        Pt has appt Monday. Army Fossa CMA  December 09, 2009 4:06 PM

## 2010-10-18 NOTE — Assessment & Plan Note (Signed)
Summary: lower back problem//ph   Vital Signs:  Patient profile:   30 year old female Weight:      261 pounds Temp:     98.6 degrees F oral Pulse rate:   86 / minute Pulse rhythm:   regular BP sitting:   118 / 80  (left arm) Cuff size:   large  Vitals Entered By: Army Fossa CMA (October 12, 2009 2:11 PM) CC: Pt c/o pulled muscle in back lower to middle said hurts for her to walk since saturday. No urinary issues. , Back Pain   History of Present Illness:       This is a 30 year old woman who presents with Back Pain.  The symptoms began 4 days ago.  Pt picked up cooler full of soda Saturday and about later her low back started to hurt.  The patient denies fever, chills, weakness, loss of sensation, fecal incontinence, urinary incontinence, urinary retention, dysuria, rest pain, inability to work, and inability to care for self.  The pain is located in the right low back, left low back, and mid low back.  The pain began at home, suddenly, and after lifting.  The pain is made worse by standing or walking.  The pain is made better by inactivity.    Current Medications (verified): 1)  Ultram 50 Mg Tabs (Tramadol Hcl) .[name].. 1 -2 Every 6 Hours As Needed 2)  Flexeril 10 Mg Tabs (Cyclobenzaprine Hcl) .[name].. 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 ASTHMA (ICD-493.90) ANEMIA-IRON DEFICIENCY (ICD-280.9)  Past Surgical History: Last updated:  07/02/2008 keloid removed 2008 salivary gland removed secondary to cancer---Dr Molli Barrows  Family History: Last updated: 07/02/2008 Family History of CAD Female 1st degree relative <50 Family History Diabetes 1st degree relative Family History High cholesterol Family History Hypertension Family History of Prostate CA 1st degree relative <50  Social History: Last updated: 07/02/2008 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 Family History of CAD Female 1st degree relative <50 Family History Diabetes 1st degree relative Family History High cholesterol Family History Hypertension Family History of Prostate CA 1st degree relative <50  Social History: Reviewed history from 07/02/2008 and no changes required. 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   Extremities:  No clubbing, cyanosis, edema, or deformity noted with normal full range of motion of all joints.   Neurologic:  strength normal in all extremities, gait normal, and DTRs symmetrical and normal.     Impression & Recommendations:  Problem # 1:  LOW BACK PAIN, ACUTE (ICD-724.2)  Her updated  medication list for this problem includes:    Ultram 50 Mg Tabs (Tramadol hcl) .[name]... 1 -2 every 6 hours as needed    Flexeril 10 Mg Tabs (Cyclobenzaprine hcl) .[name]... 1 by  mouth three times a day  as needed  Discussed use of moist heat or ice, modified activities, medications, and stretching/strengthening exercises. Back care instructions given. To be seen in 2 weeks if no improvement; sooner if worsening of symptoms.   Orders: UA Dipstick w/o Micro (manual) ([id number])  Complete Medication List: 1)  Ultram 50 Mg Tabs (Tramadol hcl) .[name].. 1 -2 every 6 hours as needed 2)  Flexeril 10 Mg Tabs (Cyclobenzaprine hcl) .[name].. 1 by mouth three times a day  as needed  Other Orders: T-Urine Culture (Spectrum Order) [id number]) T-Urine Microscopic ([id number]) Prescriptions: FLEXERIL 10 MG TABS (CYCLOBENZAPRINE HCL) 1 by mouth three times a day  as needed  #30 x 0   Entered and Authorized by:   Loreen Freud DO   Signed by:   Loreen Freud DO on 10/12/2009   Method used:   [facility]        [facility] [phone]* (retail)       [address]       [place], [place]  [address]       Ph: [phone]       Fax: [phone]   RxID:   [id number] ULTRAM 50 MG TABS (TRAMADOL HCL) 1 -2 every 6 hours as needed  #60 x 0   Entered and Authorized by:   Loreen Freud DO   Signed by:   Loreen Freud DO on 10/12/2009   Method used:   [facility]ically to        [facility] [phone]* (retail)       [address]       [place], [place]  [address]       Ph: [phone]       Fax: [phone]   RxID:   [id number]   Laboratory Results   Urine Tests    Routine Urinalysis   Color: yellow Appearance: Clear Glucose: negative   (Normal Range: Negative) Bilirubin: negative   (Normal Range: Negative) Ketone: negative   (Normal Range: Negative) Spec. Gravity: 1.015   (Normal Range: 1.003-1.035) Blood: negative   (Normal Range: Negative) pH: 6.0   (Normal Range: 5.0-8.0) Protein: negative   (Normal Range: Negative) Urobilinogen: 0.2   (Normal Range: 0-1) Nitrite: negative   (Normal Range:  Negative) Leukocyte Esterace: negative   (Normal Range: Negative)    Comments: Army Fossa CMA  October 12, 2009 2:48 PM

## 2010-10-18 NOTE — Assessment & Plan Note (Signed)
Summary: UTI??//lch   Vital Signs:  Patient profile:   30 year old female Weight:      257 pounds Pulse rate:   90 / minute Pulse rhythm:   regular BP sitting:   122 / 88  (left arm) Cuff size:   large  Vitals Entered By: Army Fossa CMA (December 28, 2009 2:04 PM) CC: Pt here c/o burning when urinating x 1 week. Denies any discharge. , Dysuria   History of Present Illness:  Dysuria      This is a 30 year old woman who presents with Dysuria.  The symptoms began 1 week ago.  The patient complains of burning with urination and urinary frequency, but denies urgency, hematuria, vaginal discharge, vaginal itching, and vaginal sores.  The patient denies the following associated symptoms: nausea, vomiting, fever, shaking chills, flank pain, abdominal pain, back pain, pelvic pain, and arthralgias.  The patient denies the following risk factors: diabetes, prior antibiotics, immunosuppression, history of GU anomaly, history of pyelonephritis, pregnancy, history of STD, and analgesic abuse.    Allergies (verified): No Known Drug Allergies  Physical Exam  General:  Well-developed,well-nourished,in no acute distress; alert,appropriate and cooperative throughout examination Psych:  Oriented X3 and normally interactive.     Impression & Recommendations:  Problem # 1:  UTI (ICD-599.0)  30 year old female Her updated medication list for this problem includes:    Cipro 500 Mg Tabs (Ciprofloxacin hcl) .[name]... 1 by mouth two times a day    Pyridium 200 Mg Tabs (Phenazopyridine hcl) .[name]... 1 by mouth three times a day  Orders: UA Dipstick w/o Micro (manual) (30865) T-Culture, Urine ([id number])  Encouraged to push clear liquids, get enough rest, and take acetaminophen as needed. To be seen in 10 days if no improvement, sooner if worse.  Complete Medication List: 1)  Diethylpropion Hcl Cr 75 Mg Xr24h-tab (Diethylpropion hcl) .[name].. 1 by mouth qam 2)  Cipro 500 Mg Tabs (Ciprofloxacin hcl) .[name].. 1 by mouth two times  a day 3)  Pyridium 200 Mg Tabs (Phenazopyridine hcl) .[name].. 1 by mouth three times a day  Other Orders: Orthopedic Surgeon Referral (Ortho Surgeon) Prescriptions: PYRIDIUM 200 MG TABS (PHENAZOPYRIDINE HCL) 1 by mouth three times a day  #6 x 0   Entered and Authorized by:   Loreen Freud DO   Signed by:   Loreen Freud DO on 12/28/2009   Method used:   [facility] to        [facility] [phone]* (retail)       [address]       [place], [place]  [address]       Ph: [phone]       Fax: [phone]   RxID:   [id number] CIPRO 500 MG TABS (CIPROFLOXACIN HCL) 1 by mouth two times a day  #10 x 0   Entered and Authorized by:   Loreen Freud DO   Signed by:   Loreen Freud DO on 12/28/2009   Method used:   [facility] to        [facility] [phone]* (retail)       [address]       [place], [place]  [address]       Ph: [phone]       Fax: [phone]   RxID:   [id number]   Laboratory Results   Urine Tests    Routine Urinalysis   Color: yellow  Appearance: Hazy Glucose: negative   (Normal Range: Negative) Bilirubin: negative   (Normal Range: Negative) Ketone: negative   (Normal Range: Negative) Spec. Gravity: 1.020   (Normal Range: 1.003-1.035) Blood: small   (Normal Range: Negative) pH: 6.5   (Normal Range: 5.0-8.0) Protein: negative   (Normal Range: Negative) Urobilinogen: 0.2   (Normal Range: 0-1) Nitrite: positive   (Normal Range: Negative) Leukocyte Esterace: negative   (Normal Range: Negative)    Comments: Army Fossa CMA  December 28, 2009 2:15 PM

## 2010-10-18 NOTE — Assessment & Plan Note (Signed)
Summary: FOR A PAP AND GET HER BIRTH CONTHROL//PH   Vital Signs:  Patient Profile:   30 Years Old Female Height:     67 inches Weight:      241.6 pounds Temp:     98.5 degrees F oral Pulse rate:   76 / minute Resp:     16 per minute BP sitting:   130 / 70  (left arm)  Pt. in pain?   yes  Vitals Entered By: Ardyth Man (July 02, 2008 8:24 AM)                  PCP:  Laury Axon  Chief Complaint:  pap, bcp, and fasting.  History of Present Illness: Pt here for pap and labs.  No complaints.    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       ASTHMA (ICD-493.90)       ANEMIA-IRON DEFICIENCY (ICD-280.9)         Past Surgical History:    Reviewed history and no changes required:       keloid removed 2008       salivary gland removed secondary to cancer---Dr [employer]   Family History:    Reviewed history and no changes required:       Family History of CAD Female 1st degree relative <50       Family History Diabetes 1st degree relative       Family History High cholesterol       Family History Hypertension       Family History of Prostate CA 1st degree relative <50  Social History:    Reviewed history and no changes required:       Occupation:  Social research officer, [employer], Environmental Biology       Single       Never Smoked       Alcohol use-yes       Drug use-no       Regular exercise-no   Risk Factors:  Tobacco use:  never Passive smoke exposure:  no Drug use:  no HIV high-risk behavior:  yes    Comments:  sexually active with 1 partner--no condoms Caffeine use:  2 drinks per day Alcohol use:  yes    Type:  vodka    Drinks per day:  <1    Has patient --       Felt need to cut down:  no       Been  annoyed by complaints:  no       Felt guilty about drinking:  no       Needed eye opener in the morning:  no    Counseled to quit/cut down alcohol use:  no Exercise:  no Seatbelt use:  0 %  Family History Risk Factors:    Family History of MI in males < 71 years old:  yes   Review of Systems      See HPI  General      Denies chills, fatigue, fever, loss of appetite, malaise, sleep disorder, sweats, weakness, and weight loss.  Eyes      Denies blurring, discharge, double vision,  eye irritation, eye pain, halos, itching, light sensitivity, red eye, vision loss-1 eye, and vision loss-both eyes.      optho q2y-- contacts and glasses  ENT      Denies decreased hearing, difficulty swallowing, ear discharge, earache, hoarseness, nasal congestion, nosebleeds, postnasal drainage, ringing in ears, sinus pressure, and sore throat.      dentist--q1y Salivary gland CA--heme/onc [facility]  CV      Denies bluish discoloration of lips or nails, chest pain or discomfort, difficulty breathing at night, difficulty breathing while lying down, fainting, fatigue, leg cramps with exertion, lightheadness, near fainting, palpitations, shortness of breath with exertion, swelling of feet, swelling of hands, and weight gain.  Resp      Denies chest discomfort, chest pain with inspiration, cough, coughing up blood, excessive snoring, hypersomnolence, morning headaches, pleuritic, shortness of breath, sputum productive, and wheezing.  GI      Denies abdominal pain, bloody stools, change in bowel habits, constipation, dark tarry stools, diarrhea, excessive appetite, gas, hemorrhoids, indigestion, loss of appetite, nausea, vomiting, vomiting blood, and yellowish skin color.  GU      Denies abnormal vaginal bleeding, decreased libido, discharge, dysuria, genital sores, hematuria, incontinence, nocturia, urinary frequency, and urinary hesitancy.  MS      Denies joint pain, joint redness, joint swelling, loss of  strength, low back pain, mid back pain, muscle aches, muscle , cramps, muscle weakness, stiffness, and thoracic pain.  Derm      Denies changes in color of skin, changes in nail beds, dryness, excessive perspiration, flushing, hair loss, insect bite(s), itching, lesion(s), poor wound healing, and rash.      [facility]--ENT--Keloid  Neuro      Denies brief paralysis, difficulty with concentration, disturbances in coordination, falling down, headaches, inability to speak, memory loss, numbness, poor balance, seizures, sensation of room spinning, tingling, tremors, visual disturbances, and weakness.  Psych      Denies alternate hallucination ( auditory/visual), anxiety, depression, easily angered, easily tearful, irritability, mental problems, panic attacks, sense of great danger, suicidal thoughts/plans, thoughts of violence, unusual visions or sounds, and thoughts /plans of harming others.  Endo      Denies cold intolerance, excessive hunger, excessive thirst, excessive urination, heat intolerance, polyuria, and weight change.  Heme      Denies abnormal bruising, bleeding, enlarge lymph nodes, fevers, pallor, and skin discoloration.  Allergy      Denies hives or rash, itching eyes, persistent infections, seasonal allergies, and sneezing.   Physical Exam  General:     Well-developed,well-nourished,in no acute distress; alert,appropriate and cooperative throughout examination Head:     Normocephalic and atraumatic without obvious abnormalities. No apparent alopecia or balding. Eyes:     vision grossly intact, pupils equal, pupils round, pupils reactive to light, and no injection.   Ears:     External ear exam shows no significant lesions or deformities.  Otoscopic examination reveals clear canals, tympanic membranes are intact bilaterally without bulging, retraction, inflammation or discharge. Hearing is grossly normal bilaterally. Nose:     External nasal examination shows no deformity or  inflammation. Nasal mucosa are pink and moist without lesions or exudates. Mouth:     Oral mucosa and oropharynx without lesions or exudates.  Teeth in good repair. Neck:     No deformities, masses, or tenderness noted. Chest Wall:     No deformities, masses, or tenderness noted. Breasts:     No mass, nodules, thickening, tenderness, bulging, retraction, inflamation, nipple discharge or skin changes noted.  Lungs:     Normal respiratory effort, chest expands symmetrically. Lungs are clear to auscultation, no crackles or wheezes. Heart:     normal rate, regular rhythm, and no murmur.   Abdomen:     Bowel sounds positive,abdomen soft and non-tender without masses, organomegaly or hernias noted. Genitalia:     Pelvic Exam:        External: normal female genitalia without lesions or masses        Vagina: normal without lesions or masses        Cervix: normal without lesions or masses        Adnexa: normal bimanual exam without masses+ fullness L adenexa         Uterus: normal by palpation        Pap smear: performed Msk:     normal ROM, no joint tenderness, no joint swelling, no joint warmth, no redness over joints, no joint deformities, no joint instability, and no crepitation.   Pulses:     R posterior tibial normal, R dorsalis pedis normal, R carotid normal, L popliteal normal, L posterior tibial normal, L dorsalis pedis normal, and L carotid normal.   Extremities:     No clubbing, cyanosis, edema, or deformity noted with normal full range of motion of all joints.   Neurologic:     No cranial nerve deficits noted. Station and gait are normal. Plantar reflexes are down-going bilaterally. DTRs are symmetrical throughout. Sensory, motor and coordinative functions appear intact. Skin:     Intact without suspicious lesions or rashes Cervical Nodes:     No lymphadenopathy noted Axillary Nodes:     No palpable lymphadenopathy Psych:     Cognition and judgment appear intact. Alert and  cooperative with normal attention span and concentration. No apparent delusions, illusions, hallucinations    Impression & Recommendations:  Problem # 1:  PREVENTIVE HEALTH CARE (ICD-V70.0) pap done ghm utd flu given bcp written Orders: Venipuncture (26948) TLB-IBC Pnl (Iron/FE;Transferrin) (83550-IBC) TLB-Lipid Panel (80061-LIPID) TLB-BMP (Basic Metabolic Panel-BMET) (80048-METABOL) TLB-CBC Platelet - w/Differential (85025-CBCD) TLB-Hepatic/Liver Function Pnl (80076-HEPATIC) TLB-TSH (Thyroid Stimulating Hormone) (84443-TSH) T-HIV Antibody  (Reflex) ([id number]) T-RPR (Syphilis) (93818-29937) T- * Misc. 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 Laboratory test [phone])   Complete Medication List: 1)  Kariva 0.15-0.02/0.01 Mg (21/5) Tabs (Desogestrel-ethinyl estradiol) .... As directed 2)  Adipex-p 37.5 Mg Caps (Phentermine hcl) .[name].. 1 by mouth once daily  Other Orders: Admin 1st Vaccine (61443) Flu Vaccine 76yrs + (15400)    Prescriptions: ADIPEX-P 37.5 MG CAPS (PHENTERMINE HCL) 1 by mouth once daily  #30 x 0   Entered and Authorized by:   Loreen Freud DO   Signed by:   Loreen Freud DO on 07/02/2008   Method used:   Print then Give to Patient   RxID:   [id number] KARIVA 0.15-0.02/0.01 MG (21/5) TABS (DESOGESTREL-ETHINYL ESTRADIOL) as directed  #1 pack x 11   Entered and Authorized by:   Loreen Freud DO   Signed by:   Loreen Freud DO on 07/02/2008    Method used:   [facility] to  [facility] [phone]* (retail)       [address]       [place], [place]  [address]       Ph: [phone]       Fax: [phone]   RxID:   [id number]  Flu Vaccine Consent Questions     Do you have a history of severe allergic reactions to this vaccine? no    Any prior history of allergic reactions to egg and/or gelatin? no    Do you have a sensitivity to the preservative Thimersol? no    Do you have a past history of Guillan-Barre Syndrome? no    Do you currently have an acute febrile illness? no    Have you ever had a severe reaction to latex? no    Vaccine information given and explained to patient? yes    Are you currently pregnant? no    Lot Number:[id number]   Site Given  Left Deltoid [phone]  ]  .opcflu   Laboratory Results   Urine Tests    Routine Urinalysis   Color: yellow Appearance: Clear Glucose: negative   (Normal Range: Negative) Bilirubin: negative   (Normal Range: Negative) Ketone: negative   (Normal Range: Negative) Spec. Gravity: 1.020   (Normal Range: 1.003-1.035) Blood: negative   (Normal Range: Negative) pH: 6.5   (Normal Range: 5.0-8.0) Protein: negative   (Normal Range: Negative) Urobilinogen: negative   (Normal Range: 0-1) Nitrite: negative   (Normal Range: Negative) Leukocyte Esterace: negative   (Normal Range: Negative)    Urine HCG: negative     Appended Document: FOR A PAP AND GET HER BIRTH CONTHROL//PH need to repeat CBCD---288.8---  wbc elevated.

## 2010-10-18 NOTE — Progress Notes (Signed)
Summary: LOWNE--YEAST INFECTION/fyi need ov  Phone Note Call from Patient Call back at [phone]   Caller: Mom Summary of Call: PT CALLING WANTING ABX  FOR A YEAST INFECTION. LAST OV 03/11/07. [facility] [address]. Initial call taken by: Freddy Jaksch,  October 03, 2007 1:01 PM  Follow-up for Phone Call        s/w pt who said just had surgery keloid from r earlobe was on abx for 10 d now have infection informed pt would need ov not seen since june pt said shw will try some otc med 1st or call doc who prescribed abx...................................................................Marland KitchenKandice Hams  October 03, 2007 2:20 PM

## 2010-10-18 NOTE — Progress Notes (Signed)
  Phone Note Outgoing Call Call back at [facility] Phone [phone]   Call placed by: Ardyth Man,  July 17, 2008 2:54 PM Call placed to: Patient Summary of Call: Is pt sick---fever , chills etc?  Hgb also dropped.  Is she taking Iron?  Follow-up for Phone Call        Pt. has not been sick, will start taking slow fe and have labs rechecked in 3 weeks. Ardyth Man  July 17, 2008 2:56 PM  Follow-up by: Ardyth Man,  July 17, 2008 2:56 PM

## 2010-10-18 NOTE — Letter (Signed)
Summary: [facility]  [facility]   Imported By: Lanelle Bal 09/25/2008 12:36:26  _____________________________________________________________________  External Attachment:    Type:   Image     Comment:   External Document

## 2010-10-20 NOTE — Assessment & Plan Note (Signed)
Summary: FOR MED REFILL//PH   Vital Signs:  Patient profile:   30 year old female Height:      66.5 inches Weight:      249.4 pounds BMI:     39.79 Pulse rate:   76 / minute Pulse rhythm:   regular BP sitting:   138 / 90  (left arm) Cuff size:   large  Vitals Entered By: Almeta Monas CMA Duncan Dull) (October 07, 2010 11:06 AM)  Serial Vital Signs/Assessments:  Time      Position  BP       Pulse  Resp  Temp     By 11:44 AM            122/82                         Loreen Freud DO  CC: med refill   History of Present Illness: Pt is here to get refill of med for weight loss.  Pt denies any chest pain , sob.    Pt is exercising 3x a week- for 30 min and is on a 1200 cal diet.     Current Medications (verified): 1)  Diethylpropion Hcl Cr 75 Mg Xr24h-Tab (Diethylpropion Hcl) .[name].. 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 ASTHMA (ICD-493.90) ANEMIA-IRON DEFICIENCY (ICD-280.9)  Past Surgical History: Last updated: 07/02/2008 keloid removed 2008 salivary gland removed secondary to cancer---Dr Molli Barrows  Family History: Last updated: 07/02/2008 Family History of CAD Female 1st degree relative <50 Family History Diabetes 1st degree relative Family History High cholesterol Family History Hypertension Family History of Prostate CA 1st degree relative <50  Social History: Last updated: 07/02/2008 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 Family History of CAD Female 1st degree relative <50 Family History Diabetes 1st degree relative Family History High cholesterol Family History Hypertension Family History of Prostate CA 1st degree relative <50  Social History: Reviewed history from 07/02/2008 and no changes required. 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 Lungs are clear to auscultation, no crackles or wheezes. Heart:  normal rate and no murmur.   Extremities:  No clubbing, cyanosis, edema, or deformity noted with normal full range of motion of all joints.   Psych:  Oriented X3 and normally interactive.     Impression & Recommendations:  Problem # 1:  OBESITY NOS (ICD-278.00) con't with diet and exercise med refill  rto 1 month Ht: 66.5 (10/07/2010)   Wt: 249.4 (10/07/2010)   BMI: 39.79 (10/07/2010)  Problem # 2:  ELEVATED BP READING WITHOUT DX HYPERTENSION (ICD-796.2)  came down with repeat  BP today: 138/90 Prior BP: 122/88 (12/28/2009)  Labs Reviewed: Creat: 0.7 (07/02/2008) Chol: 139 (07/02/2008)   HDL: 54.4 (07/02/2008)   LDL: 71 (07/02/2008)   TG: 69 (07/02/2008)  Instructed in low sodium diet (DASH Handout) and behavior modification.    Complete Medication List: 1)  Diethylpropion Hcl Cr 75 Mg Xr24h-tab (Diethylpropion hcl) .[name].. 1 by mouth qam  Patient  Instructions: 1)  Please schedule a follow-up appointment in 1 month.  Prescriptions: DIETHYLPROPION HCL CR 75 MG XR24H-TAB (DIETHYLPROPION HCL) 1 by mouth qam  #30 x 0   Entered and Authorized by:   Loreen Freud DO   Signed by:   Loreen Freud DO on 10/07/2010   Method used:   Print then Give to Patient   RxID:   [id number]    Orders Added: 1)  Est. Patient Level III [14782]

## 2010-10-28 ENCOUNTER — Encounter: Payer: Self-pay | Admitting: Family Medicine

## 2010-11-10 ENCOUNTER — Other Ambulatory Visit: Payer: Self-pay | Admitting: Family Medicine

## 2010-11-10 ENCOUNTER — Encounter (INDEPENDENT_AMBULATORY_CARE_PROVIDER_SITE_OTHER): Payer: BC Managed Care – PPO | Admitting: Family Medicine

## 2010-11-10 ENCOUNTER — Other Ambulatory Visit (HOSPITAL_COMMUNITY)
Admission: RE | Admit: 2010-11-10 | Discharge: 2010-11-10 | Disposition: A | Discharge status: Home / Self Care | Payer: BC Managed Care – PPO | Source: Ambulatory Visit | Attending: Family Medicine | Admitting: Family Medicine

## 2010-11-10 ENCOUNTER — Encounter: Payer: Self-pay | Admitting: Family Medicine

## 2010-11-10 DIAGNOSIS — Z0000 Encounter for general adult medical examination without abnormal findings: Secondary | ICD-10-CM

## 2010-11-10 DIAGNOSIS — D509 Iron deficiency anemia, unspecified: Secondary | ICD-10-CM

## 2010-11-10 DIAGNOSIS — E041 Nontoxic single thyroid nodule: Secondary | ICD-10-CM

## 2010-11-10 DIAGNOSIS — Z23 Encounter for immunization: Secondary | ICD-10-CM

## 2010-11-10 DIAGNOSIS — Z113 Encounter for screening for infections with a predominantly sexual mode of transmission: Secondary | ICD-10-CM | POA: Insufficient documentation

## 2010-11-10 DIAGNOSIS — Z01419 Encounter for gynecological examination (general) (routine) without abnormal findings: Secondary | ICD-10-CM | POA: Insufficient documentation

## 2010-11-10 DIAGNOSIS — E669 Obesity, unspecified: Secondary | ICD-10-CM

## 2010-11-10 LAB — CONVERTED CEMR LAB
Bilirubin Urine: NEGATIVE
Blood in Urine, dipstick: NEGATIVE
Glucose, Urine, Semiquant: NEGATIVE
Ketones, urine, test strip: NEGATIVE
Nitrite: NEGATIVE
Protein, U semiquant: NEGATIVE
Specific Gravity, Urine: 1.015
Urobilinogen, UA: NEGATIVE
WBC Urine, dipstick: NEGATIVE
pH: 6

## 2010-11-10 LAB — B12 AND FOLATE PANEL
Folate: 7 ng/mL (ref >5.9)
Vitamin B-12: 301 pg/mL (ref 211–911)

## 2010-11-10 LAB — CBC WITH DIFFERENTIAL/PLATELET
Basophils Absolute: 0 10*3/uL (ref 0.0–0.1)
Basophils Relative: 0.4 % (ref 0.0–3.0)
Eosinophils Absolute: 0.2 10*3/uL (ref 0.0–0.7)
Eosinophils Relative: 1.8 % (ref 0.0–5.0)
HCT: 38.6 % (ref 36.0–46.0)
Hemoglobin: 12.3 g/dL (ref 12.0–15.0)
Lymphocytes Relative: 24.1 % (ref 12.0–46.0)
Lymphs Abs: 2.3 10*3/uL (ref 0.7–4.0)
MCHC: 31.9 g/dL (ref 30.0–36.0)
MCV: 71.6 fl — ABNORMAL LOW (ref 78.0–100.0)
Monocytes Absolute: 0.5 10*3/uL (ref 0.1–1.0)
Monocytes Relative: 5.6 % (ref 3.0–12.0)
Neutro Abs: 6.6 10*3/uL (ref 1.4–7.7)
Neutrophils Relative %: 68.1 % (ref 43.0–77.0)
Platelets: 296 10*3/uL (ref 150.0–400.0)
RBC: 5.39 Mil/uL — ABNORMAL HIGH (ref 3.87–5.11)
RDW: 16 % — ABNORMAL HIGH (ref 11.5–14.6)
WBC: 9.7 10*3/uL (ref 4.5–10.5)

## 2010-11-10 LAB — HEPATIC FUNCTION PANEL
ALT: 22 U/L (ref 0–35)
AST: 17 U/L (ref 0–37)
Albumin: 4 g/dL (ref 3.5–5.2)
Alkaline Phosphatase: 126 U/L — ABNORMAL HIGH (ref 39–117)
Bilirubin, Direct: 0.1 mg/dL (ref 0.0–0.3)
Total Bilirubin: 0.6 mg/dL (ref 0.3–1.2)
Total Protein: 7.6 g/dL (ref 6.0–8.3)

## 2010-11-10 LAB — BASIC METABOLIC PANEL
BUN: 7 mg/dL (ref 6–23)
CO2: 28 mEq/L (ref 19–32)
Calcium: 9.4 mg/dL (ref 8.4–10.5)
Chloride: 104 mEq/L (ref 96–112)
Creatinine, Ser: 0.9 mg/dL (ref 0.4–1.2)
GFR: 101.31 mL/min (ref >60.00)
Glucose, Bld: 89 mg/dL (ref 70–99)
Potassium: 4.3 mEq/L (ref 3.5–5.1)
Sodium: 138 mEq/L (ref 135–145)

## 2010-11-10 LAB — HM PAP SMEAR: HM Pap smear: NEGATIVE

## 2010-11-10 LAB — TSH: TSH: 2.07 u[IU]/mL (ref 0.35–5.50)

## 2010-11-10 LAB — LIPID PANEL
Cholesterol: 134 mg/dL (ref 0–200)
HDL: 38.5 mg/dL — ABNORMAL LOW (ref >39.00)
LDL Cholesterol: 87 mg/dL (ref 0–99)
Total CHOL/HDL Ratio: 3
Triglycerides: 41 mg/dL (ref 0.0–149.0)
VLDL: 8.2 mg/dL (ref 0.0–40.0)

## 2010-11-10 LAB — IBC PANEL
Iron: 42 ug/dL (ref 42–145)
Saturation Ratios: 10.8 % — ABNORMAL LOW (ref 20.0–50.0)
Transferrin: 276.5 mg/dL (ref 212.0–360.0)

## 2010-11-11 LAB — CONVERTED CEMR LAB: HIV: NONREACTIVE

## 2010-11-15 NOTE — Assessment & Plan Note (Signed)
Summary: cpx/fasting/kn   Vital Signs:  Patient profile:   30 year old female Menstrual status:  regular LMP:     10/26/2010 Height:      66.5 inches Weight:      243.8 pounds Pulse rate:   72 / minute Pulse rhythm:   regular BP sitting:   120 / 70  (right arm) Cuff size:   large  Vitals Entered By: Almeta Monas CMA Duncan Dull) (November 10, 2010 10:51 AM) CC: CPX/Fasting with pap LMP (date): 10/26/2010     Menstrual Status regular Enter LMP: 10/26/2010 Last PAP Result NEGATIVE FOR INTRAEPITHELIAL LESIONS OR MALIGNANCY.   History of Present Illness: Pt here for cpe.  She doesn't like the meds because they make her "mean".  She would like to try something different.     Preventive Screening-Counseling & Management  Alcohol-Tobacco     Alcohol drinks/day: <1     Alcohol type: vodka     Smoking Status: never     Passive Smoke Exposure: no  Caffeine-Diet-Exercise     Caffeine use/day: 2     Does Patient Exercise: no     Exercise Counseling: to improve exercise regimen  Hep-HIV-STD-Contraception     HIV Risk: yes  Safety-Violence-Falls     Seat Belt Use: 0      Sexual History:  currently monogamous.    Problems Prior to Update: 1)  Uti  (ICD-599.0) 2)  Thyroid Nodule  (ICD-241.0) 3)  Wrist Pain, Left  (ICD-719.43) 4)  Low Back Pain, Acute  (ICD-724.2) 5)  Dysuria  (ICD-788.1) 6)  Thyromegaly  (ICD-240.9) 7)  Cardiomegaly, Mild  (ICD-429.3) 8)  Preventive Health Care  (ICD-V70.0) 9)  Elevated Bp Reading Without Dx Hypertension  (ICD-796.2) 10)  Leukocytosis Unspecified  (ICD-288.60) 11)  Preventive Health Care  (ICD-V70.0) 12)  Family History Diabetes 1st Degree Relative  (ICD-V18.0) 13)  Family History of Cad Female 1st Degree Relative <50  (ICD-V17.3) 14)  Uti  (ICD-599.0) 15)  Obesity Nos  (ICD-278.00) 16)  Disease, 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 1 By Mouth Qam  Current Medications (verified): 1)  Adipex-P 37.5 Mg Caps (Phentermine Hcl) .[name].. 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 ASTHMA (ICD-493.90) ANEMIA-IRON DEFICIENCY (ICD-280.9)  Family History: Last updated: 07/02/2008 Family History of CAD Female 1st degree relative <50 Family History Diabetes 1st degree relative Family History High cholesterol Family History Hypertension Family History of Prostate CA 1st degree relative <50  Social History: Last updated: 07/02/2008 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 Family History of CAD Female 1st degree relative <50 Family History  Diabetes 1st degree relative Family History High cholesterol Family History Hypertension Family History of Prostate CA 1st degree relative <50  Social History: Reviewed history from 07/02/2008 and no changes required. 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 Eyes:  Denies blurring, discharge, double vision, eye irritation, eye pain, halos, itching, light sensitivity, red eye, vision loss-1 eye, and vision loss-both eyes; optho due. ENT:  Denies decreased hearing, difficulty swallowing, ear discharge, earache, hoarseness, nasal congestion, nosebleeds, postnasal drainage, ringing in ears, sinus pressure, and sore throat. CV:  Denies bluish discoloration of lips or nails, chest pain or discomfort, difficulty breathing at night, difficulty breathing while lying down, fainting, fatigue, leg cramps with exertion, lightheadness, near fainting, palpitations, shortness of breath with exertion, swelling of feet, swelling of hands, and weight gain. Resp:  Denies chest discomfort, chest pain with inspiration, cough, coughing up blood, excessive snoring, hypersomnolence, morning headaches, pleuritic, shortness of breath, sputum productive, and wheezing. GI:  Denies abdominal pain, bloody stools, change in bowel habits, constipation, dark tarry stools, diarrhea, excessive appetite, gas, hemorrhoids, indigestion, loss of appetite, nausea, vomiting, vomiting blood, and yellowish skin color. GU:  Denies abnormal vaginal bleeding, decreased libido, discharge, dysuria, genital sores, hematuria, incontinence, nocturia, urinary frequency, and urinary hesitancy. MS:  Denies joint pain, joint redness, joint swelling, loss of strength, low back pain, mid back pain, muscle aches, muscle , cramps, muscle weakness, stiffness, and thoracic pain. Derm:  Denies changes in color of skin, changes in nail beds, dryness, excessive perspiration, flushing, hair loss, insect bite(s), itching,  lesion(s), poor wound healing, and rash; keloids. Neuro:  Denies brief paralysis, difficulty with concentration, disturbances in coordination, falling down, headaches, inability to speak, memory loss, numbness, poor balance, seizures, sensation of room spinning, tingling, tremors, visual disturbances, and weakness. Psych:  Denies alternate hallucination ( auditory/visual), anxiety, depression, easily angered, easily tearful, irritability, mental problems, panic attacks, sense of great danger, suicidal thoughts/plans, thoughts of violence, unusual visions or sounds, and thoughts /plans of harming others. Endo:  Denies cold intolerance, excessive hunger, excessive thirst, excessive urination, heat intolerance, polyuria, and weight change. Heme:  Denies abnormal bruising, bleeding, enlarge lymph nodes, fevers, pallor, and skin discoloration. Allergy:  Denies hives or rash, itching eyes, persistent infections, seasonal allergies, and sneezing.  Physical Exam  General:  Well-developed,well-nourished,in no acute distress; alert,appropriate and cooperative throughout examinationoverweight-appearing.   Head:  Normocephalic and atraumatic without obvious abnormalities. No apparent alopecia or balding. Eyes:  pupils equal, pupils round, pupils reactive to light, and no injection.   Ears:  External ear exam shows no significant lesions or deformities.  Otoscopic examination reveals clear canals, tympanic membranes are intact bilaterally without bulging, retraction, inflammation or discharge. Hearing is grossly normal bilaterally. Nose:  External nasal examination shows no deformity or inflammation. Nasal mucosa are pink and moist without lesions or exudates. Mouth:  Oral mucosa and oropharynx without lesions or exudates.  Teeth in good repair. Neck:  No deformities, masses, or tenderness noted. Chest Wall:  No deformities, masses, or tenderness noted. Breasts:  No mass, nodules, thickening, tenderness,  bulging, retraction, inflamation, nipple discharge or skin changes noted.   Lungs:  Normal respiratory effort, chest expands symmetrically. Lungs are clear to auscultation, no crackles or wheezes. Heart:  normal rate and no murmur.  Abdomen:  Bowel sounds positive,abdomen soft and non-tender without masses, organomegaly or hernias noted. Genitalia:  Pelvic Exam:        External: normal female genitalia without lesions or masses        Vagina: normal without lesions or masses        Cervix: normal without lesions or masses        Adnexa: normal bimanual exam without masses or fullness        Uterus: normal by palpation        Pap smear: performed Msk:  normal ROM, no joint tenderness, no joint swelling, no joint warmth, no redness over joints, no joint deformities, no joint instability, and no crepitation.   Pulses:  R posterior tibial normal, R dorsalis pedis normal, R carotid normal, L posterior tibial normal, L dorsalis pedis normal, and L carotid normal.   Extremities:  No clubbing, cyanosis, edema, or deformity noted with normal full range of motion of all joints.   Neurologic:  No cranial nerve deficits noted. Station and gait are normal. Plantar reflexes are down-going bilaterally. DTRs are symmetrical throughout. Sensory, motor and coordinative functions appear intact. Skin:  Intact without suspicious lesions or rashes Cervical Nodes:  No lymphadenopathy noted Axillary Nodes:  No palpable lymphadenopathy Inguinal Nodes:  No significant adenopathy Psych:  Cognition and judgment appear intact. Alert and cooperative with normal attention span and concentration. No apparent delusions, illusions, hallucinations   Impression & Recommendations:  Problem # 1:  PREVENTIVE HEALTH CARE (ICD-V70.0)  Orders: Venipuncture (16109) TLB-Lipid Panel (80061-LIPID) TLB-BMP (Basic Metabolic Panel-BMET) (80048-METABOL) TLB-CBC Platelet - w/Differential (85025-CBCD) TLB-Hepatic/Liver Function Pnl  (80076-HEPATIC) TLB-TSH (Thyroid Stimulating Hormone) (84443-TSH) TLB-IBC Pnl (Iron/FE;Transferrin) (83550-IBC) TLB-B12 + Folate Pnl (60454_09811-B14/NWG) T-HIV Antibody  (Reflex) [id number]) T-RPR (Syphilis) (78469-62952) T- * Misc. Laboratory test [id number]) Specimen Handling (44010) UA Dipstick W/ Micro (manual) (81000)  Problem # 2:  THYROID NODULE (ICD-241.0)  Problem # 3:  OBESITY NOS (ICD-278.00)  Ht: 66.5 (11/10/2010)   Wt: 243.8 (11/10/2010)   BMI: 39.79 (10/07/2010)  Complete Medication List: 1)  Adipex-p 37.5 Mg Caps (Phentermine hcl) .[name].. 1 by mouth qam  Other Orders: Tdap => 28yrs IM (27253) Admin 1st Vaccine (66440) Prescriptions: ADIPEX-P 37.5 MG CAPS (PHENTERMINE HCL) 1 by mouth qam  #30 x 0   Entered and Authorized by:   Loreen Freud DO   Signed by:   Loreen Freud DO on 11/10/2010   Method used:   Print then Give to Patient   RxID:   [id number]    Orders Added: 1)  Venipuncture [32951] 2)  TLB-Lipid Panel [80061-LIPID] 3)  TLB-BMP (Basic Metabolic Panel-BMET) [80048-METABOL] 4)  TLB-CBC Platelet - w/Differential [85025-CBCD] 5)  TLB-Hepatic/Liver Function Pnl [80076-HEPATIC] 6)  TLB-TSH (Thyroid Stimulating Hormone) [84443-TSH] 7)  TLB-IBC Pnl (Iron/FE;Transferrin) [83550-IBC] 8)  TLB-B12 + Folate Pnl [82746_82607-B12/FOL] 9)  T-HIV Antibody  (Reflex) [[id number]] 10)  T-RPR (Syphilis) [16010-93235] 11)  T- * Misc. Laboratory test [99999] 12)  Tdap => 110yrs IM [90715] 13)  Admin 1st Vaccine [90471] 14)  Specimen Handling [99000] 15)  UA Dipstick W/ Micro (manual) [81000] 16)  Est. Patient 18-39 years [99395]   Immunizations Administered:  Tetanus Vaccine:    Vaccine Type: Tdap    Site: right deltoid    Mfr: Merck    Dose: 0.5 ml    Route: IM    Given by: Almeta Monas CMA (AAMA)    Exp. Date: 08/11/2012    Lot #: [id number]    VIS given: 08/05/08  version given November 10, 2010.   Immunizations Administered:  Tetanus Vaccine:     Vaccine Type: Tdap    Site: right deltoid    Mfr: Merck    Dose: 0.5 ml    Route: IM    Given by: Almeta Monas CMA (AAMA)    Exp. Date: 08/11/2012    Lot #: [id number]    VIS given: 08/05/08 version given November 10, 2010.  Last Flu Vaccine:  Fluvax 3+ (07/13/2009 2:00:14 PM) Flu Vaccine Next Due:  Refused    Laboratory Results   Urine Tests   Date/Time Reported: November 10, 2010 11:52 AM   Routine Urinalysis   Color: yellow Appearance: Clear Glucose: negative   (Normal Range: Negative) Bilirubin: negative   (Normal Range: Negative) Ketone: negative   (Normal Range: Negative) Spec. Gravity: 1.015   (Normal Range: 1.003-1.035) Blood: negative   (Normal Range: Negative) pH: 6.0   (Normal Range: 5.0-8.0) Protein: negative   (Normal Range: Negative) Urobilinogen: negative   (Normal Range: 0-1) Nitrite: negative   (Normal Range: Negative) Leukocyte Esterace: negative   (Normal Range: Negative)    Comments: Floydene Flock  November 10, 2010 11:52 AM

## 2010-11-24 ENCOUNTER — Encounter (INDEPENDENT_AMBULATORY_CARE_PROVIDER_SITE_OTHER): Payer: Self-pay | Admitting: *Deleted

## 2010-11-24 ENCOUNTER — Encounter: Payer: Self-pay | Admitting: Family Medicine

## 2010-11-24 ENCOUNTER — Telehealth: Payer: Self-pay | Admitting: Family Medicine

## 2010-11-24 ENCOUNTER — Other Ambulatory Visit (INDEPENDENT_AMBULATORY_CARE_PROVIDER_SITE_OTHER): Payer: BC Managed Care – PPO

## 2010-11-24 DIAGNOSIS — R7402 Elevation of levels of lactic acid dehydrogenase (LDH): Secondary | ICD-10-CM

## 2010-11-24 DIAGNOSIS — R7401 Elevation of levels of liver transaminase levels: Secondary | ICD-10-CM

## 2010-11-24 LAB — CONVERTED CEMR LAB: Beta hcg, urine, semiquantitative: NEGATIVE

## 2010-11-29 NOTE — Progress Notes (Signed)
Summary: refill  Phone Note Refill Request Call back at Home Phone [phone] P PH     Refills Requested: Medication #1:  Kariva 0.15-0.02/0.01 Mg (21/5) Tabs (Desogestrel-ethinyl estradiol) .... As directed Pt would like to restart birth control. Pt coming in for lab work today ok to add urine pregnancy. Pt uses [facility]. Pls advise.......Marland KitchenFelecia Deloach CMA  November 24, 2010 12:17 PM    Follow-up for Phone Call        yes add urine preg than can call in bcp for 1 year Follow-up by: Loreen Freud DO,  November 24, 2010 12:56 PM  Additional Follow-up for Phone Call Additional follow up Details #1::        Pt would like to know which birth control would you suggest current one or nuvaring. Pls advise.Marland KitchenMarland KitchenFelecia Deloach CMA  November 24, 2010 1:23 PM     Additional Follow-up for Phone Call Additional follow up Details #2::    i was going to call in the one she had  Follow-up by: Loreen Freud DO,  November 24, 2010 2:27 PM  Additional Follow-up for Phone Call Additional follow up Details #3:: Details for Additional Follow-up Action Taken: Pt aware...........Marland KitchenFelecia Deloach CMA  November 24, 2010 4:13 PM   New/Updated Medications: KARIVA 0.15-0.02/0.01 MG (21/5) TABS (DESOGESTREL-ETHINYL ESTRADIOL) As directed Prescriptions: KARIVA 0.15-0.02/0.01 MG (21/5) TABS (DESOGESTREL-ETHINYL ESTRADIOL) As directed  #1 month x 11   Entered by:   Jeremy Johann CMA   Authorized by:   Loreen Freud DO   Signed by:   Jeremy Johann CMA on 11/24/2010   Method used:   [facility]axed to ...       [phone][facility] (retail)       [address]       [place], [place]  [address]       Ph: [phone]       Fax: [phone]   RxID:   [id number]

## 2011-01-06 ENCOUNTER — Encounter: Payer: Self-pay | Admitting: Family Medicine

## 2011-01-11 ENCOUNTER — Encounter: Payer: Self-pay | Admitting: Family Medicine

## 2011-01-11 ENCOUNTER — Ambulatory Visit (INDEPENDENT_AMBULATORY_CARE_PROVIDER_SITE_OTHER): Payer: BC Managed Care – PPO | Admitting: Family Medicine

## 2011-01-11 VITALS — BP 116/72 | HR 80 | Wt 239.6 lb

## 2011-01-11 DIAGNOSIS — F3289 Other specified depressive episodes: Secondary | ICD-10-CM

## 2011-01-11 DIAGNOSIS — F32A Depression, unspecified: Secondary | ICD-10-CM

## 2011-01-11 DIAGNOSIS — E669 Obesity, unspecified: Secondary | ICD-10-CM

## 2011-01-11 DIAGNOSIS — F329 Major depressive disorder, single episode, unspecified: Secondary | ICD-10-CM

## 2011-01-11 MED ORDER — DIETHYLPROPION HCL ER 75 MG PO TB24: ORAL_TABLET | ORAL | Status: DC

## 2011-01-11 MED ORDER — PHENTERMINE HCL 37.5 MG PO CAPS: 37.5000 mg | ORAL_CAPSULE | ORAL | Status: DC

## 2011-01-11 MED ORDER — ALBUTEROL SULFATE HFA 108 (90 BASE) MCG/ACT IN AERS: 2.0000 | INHALATION_SPRAY | Freq: Four times a day (QID) | RESPIRATORY_TRACT | Status: DC | PRN

## 2011-01-11 MED ORDER — FLUOXETINE HCL 10 MG PO CAPS: 10.0000 mg | ORAL_CAPSULE | Freq: Every day | ORAL | Status: DC

## 2011-01-11 NOTE — Patient Instructions (Signed)
Calorie Counting Diet A calorie counting diet requires you to eat the number of calories that are right for you during a day. Calories are the measurement of how much energy you get from the food you eat. Eating the right amount of calories is important for staying at a healthy weight. If you eat too many calories your body will store them as fat and you may gain weight. If you eat too few calories you may lose weight. Counting the number of calories that you eat during a day will help you to know if you're eating the right amount. A Registered Dietitian can determine how many calories you need in a day. The amount of calories you need varies from person to person. If your goal is to lose weight you will need to eat fewer calories. Losing weight can benefit you if you are overweight or have health problems such as heart disease, high blood pressure or diabetes. If your goal is to gain weight, you will need to eat more calories. Gaining weight may be necessary if you have a certain health problem that causes your body to need more energy. TIPS Whether you are increasing or decreasing the number of calories you eat during a day, it may be hard to get used to changing what you eat and drink. The following are tips to help you keep track of the number of calories you are eating.  Measuring foods at home with measuring cups will help you to know the actual amount of food and number of calories you are eating.   Restaurants serve food in all different portion sizes. It is common that restaurants will serve food in amounts worth 2 or more serving sizes. While eating out, it may be helpful to estimate how many servings of a food you are given. For example, a serving of cooked rice is 1/2 cup and that is the size of half of a fist. Knowing serving sizes will help you have a better idea of how much food you are eating at restaurants.   Ask for smaller portion sizes or child-size portions at restaurants.   Plan to  eat half of a meal at a restaurant and take the rest home or share the other half with a friend   Read food labels for calorie content and serving size   Most packaged food has a Nutrition Facts Panel on its side or back. Here you can find out how many servings are in a package, the size of a serving, and the number of calories each serving has.   The serving size and number of servings per container are listed right below the Nutrition Facts heading. Just below the serving information, the number of calories in each serving is listed.   For example, say that a package has three cookies inside. The Nutrition Facts panel says that one serving is one cookie. Below that, it says that there are three servings in the container. The calories section of the Nutrition Facts says there are 90 calories. That means that there are 90 calories in one cookie. If you eat one cookie you have eaten 90 calories. If you eat all three cookies, you have eaten three times that amount, or 270 calories.  The list below tells you how big or small some common portion sizes are.  1 ounce (oz).................4 stacked dice.   3 oz..............................Deck of cards.   1 teaspoon (tsp)...........Tip of little finger.   1 tablespoon (Tbsp)....Tip of thumb.     2 Tbsp..........................Golf ball.    Cup..........................Half of a fist.   1 Cup...........................A fist.  KEEP A FOOD LOG Write down every food item that you eat, how much of the food you eat, and the number of calories in each food that you eat during the day. At the end of the day or throughout the day you can add up the total number of calories you have eaten.  It may help to set up a list like the one below. Find out the calorie information by reading food labels.  Breakfast   Bran Flakes (1 cup, 110 calories).   Fat free milk ( cup, 45 calories).   Snack   Apple (1 medium, 80 calories).   Lunch   Spinach (1  cup, 20 calories).   Tomato ( medium, 20 calories).   Chicken breast strips (3 oz, 165 calories).   Shredded cheddar cheese ( cup, 110 calories).   Light Italian dressing (2 Tbsp, 60 calories).   Whole wheat bread (1 slice, 80 calories).   Tub margarine (1 tsp, 35 calories).   Vegetable soup (1 cup, 160 calories).   Dinner   Pork chop (3 oz, 190 calories).   Brown rice (1 cup, 215 calories).   Steamed broccoli ( cup, 20 calories).   Strawberries (1  cup, 65 calories).   Whipped cream (1 Tbsp, 50 calories).  Daily Calorie Total: 1425 Information from www.eatright.org, Foodwise Nutritional Analysis Database. Document Released: 09/04/2005 Document Re-Released: 09/26/2009 Exit[employer] Patient Information 2011 [employer]. 

## 2011-01-11 NOTE — Assessment & Plan Note (Signed)
Pt would like to go back on first med she tried. rx given con't diet and exercise. rto 1 month

## 2011-01-11 NOTE — Progress Notes (Signed)
  Subjective:    Patient ID: [name], female    DOB: 05-19-1981, 30 y.o.   MRN: [id number]  HPI Pt here for weight check.   Pt is exercising 1x a week--tennis.  Pt is following a 1200 cal diet.   Pt would also like to start back on prozac.  She has been under a lot of stress.    Review of Systems as above   Objective:   Physical Exam  Constitutional: She appears well-developed and well-nourished.  Cardiovascular: Normal rate, regular rhythm and normal heart sounds.   No murmur heard. Pulmonary/Chest: Effort normal and breath sounds normal. No respiratory distress. She has no wheezes. She has no rales. She exhibits no tenderness.  Skin: Skin is warm and dry.  Psychiatric: She has a normal mood and affect. Her behavior is normal. Judgment and thought content normal.          Assessment & Plan:

## 2011-02-03 NOTE — Op Note (Signed)
NAMEJABREA, [name]                       ACCOUNT NO.:  [id number]   MEDICAL RECORD NO.:  [id number]                   PATIENT TYPE:  OIB   LOCATION:  5732                                 FACILITY:  [facility]   PHYSICIAN:  Kinnie Scales. Annalee Genta, M.D.            DATE OF BIRTH:  29-Jul-1981   DATE OF PROCEDURE:  04/24/2003  DATE OF DISCHARGE:                                 OPERATIVE REPORT   PREOPERATIVE DIAGNOSIS:  Right parotid mass.   POSTOPERATIVE DIAGNOSIS:  Right parotid mass.   OPERATIVE PROCEDURE:  Right superficial parotidectomy with facial nerve  dissection.   ANESTHESIA:  General endotracheal anesthesia.   SURGEON:  Kinnie Scales. Annalee Genta, M.D.   ASSISTANT:  Alfonse Flavors, M.D.   COMPLICATIONS:  None.   ESTIMATED BLOOD LOSS:  Less than 50 mL.   DISPOSITION:  The patient was transferred from the operating room to the  recovery room in stable condition.   BRIEF HISTORY:  [name] is a 30 year old black female who was referred for  evaluation of a gradually enlarging mass involving the right periparotid  region.  She had no significant symptoms and no facial nerve weakness.  An  MRI scan was obtained which showed a diffusely enhancing mass in the mid  aspect of the superficial component of the right parotid gland.  Fine needle  aspiration was performed by Dr. Carlena Sax and pathology was  nondiagnostic.  Given the patient's history examination and MRI findings, I  recommended that we consider her for right parotidectomy with facial nerve  dissection.  The risks, benefits, and possible complications of this  surgical procedure were discussed in detail with the patient and her parents  who understood and concurred with our plan for surgery which was scheduled  for April 24, 2003.   DESCRIPTION OF PROCEDURE:  The patient was brought to the operating room on  April 24, 2003, and placed in supine position on the operating table.  General endotracheal anesthesia was  established without difficulty and the  patient was adequately anesthetized.  She was prepped and draped in a  sterile fashion and injected with 3 mL of 1% lidocaine with 1:100,000  epinephrine injected in a subcutaneous fashion in the proposed incision  site.  The nerve integrity monitor system (Xomed NIMS) was placed at the  orbicularis oculi and orbicularis aureus muscles and the system integrity  was checked.  The NIMS monitor was noted throughout the facial nerve  dissection portion of the surgery in order to reduce the risk of injury to  the facial nerve.   The surgical procedure was begun by creating a skin incision extending from  the right preauricular crease, around the right earlobe, and into a superior  skin crease in the right neck.  A subcutaneous flap was elevated with a #15  scalpel blade and the entire skin and muscle flap was elevated from  posterior to anterior.  The periparotid  fascia was identified and dissection  was carried out along the fascia of the parotid gland, lifting skin and SMAS  muscle anteriorly.  This allowed isolation of the right parotid gland.  The  anterior margin of the sternocleidomastoid muscle was identified and fascial  tissue was elevated anteriorly with the gland.  The great auricular nerve  was identified and transected.  The common facial vein was identified,  divided, and suture ligated.  Dissection was carried along the deep aspect  of the incision and the posterior belly of the digastric muscle was  identified.  With the posterior and inferior aspects of the gland isolated,  attention was turned to the preauricular tragus and dissection was carried  along the tragus from superficial to deep and the preauricular sulcus along  the anterior aspect of the right ear canal.  With gentle forward traction on  the parotid gland, the posterior attachment of the parotid gland and  periparotid fascia was identified and divided.  The common trunk of  the  facial nerve was identified at the stylomastoid foramen and followed  distally to the main bifurcation and to superior and inferior branches.  The  mass within the parotid gland was palpable in the mid and superior aspects  of the gland.  The superior division of the facial nerve was then followed  and each of its branches were identified and preserved.  The parotid tumor  and associated gland were then reflected superiorly and anteriorly to allow  dissection of the nerve which was preserved throughout its course.  Attention was turned to the inferior division of the facial nerve and the  branches were identified and followed peripherally, identifying buccal,  marginal, and cervical branches.  The superficial component of the parotid  gland including the  entire tumor was then resected from the surrounding  fascia and sent to pathology for gross microscopic evaluation.  The  patient's wound was then irrigated with saline solution.  Several areas of  point hemorrhage were identified and suture ligated.  Several additional  areas were cauterized with bipolar cautery.  A 7 mm Blake drain was then  placed at the depths of the wound and carried out through a separate stab  incision in the postauricular crease.  This was sutured into position with a  2-0 silk suture.  The patient's incision was then closed in multiple layers  consisting of 4-0 and 5-0 Vicryl sutures to reapproximate the periparotid  fascia and deep subcutaneous tissues.  Final skin closure was achieved with  5-0 Ethilon running locked suture in the inferior aspect of the incision and  6-0 Ethilon running locked suture in the superior aspect of the incision.  The wound was then cleansed and dressed with Bacitracin ointment.  The  patient was then awakened from anesthetic.  Prior to wound closure, the  facial nerve was stimulated at 0.3 milliamps with the NIMS monitor and all branches of the facial nerve were easily  stimulable.  The patient was  awakened from anesthetic, she was extubated and was then transferred from  the operating room to the recovery room in stable condition.  There were no  complications.  Estimated blood loss less than 50 mL.                                               Kinnie Scales. Annalee Genta, M.D.  DLS/MEDQ  D:  16/06/9603  T:  04/24/2003  Job:  [id number]

## 2011-02-03 NOTE — Discharge Summary (Signed)
[name], [name]                       ACCOUNT NO.:  [id number]   MEDICAL RECORD NO.:  [id number]                   PATIENT TYPE:  OIB   LOCATION:  5732                                 FACILITY:  [facility]   PHYSICIAN:  Kinnie Scales. Annalee Genta, M.D.            DATE OF BIRTH:  1981/06/04   DATE OF ADMISSION:  04/24/2003  DATE OF DISCHARGE:  04/25/2003                                 DISCHARGE SUMMARY   SERVICE:  ENT.   ADMISSION DIAGNOSIS:  Right parotid mass.   DISCHARGE DIAGNOSIS:  Right parotid mass.   SURGICAL PROCEDURE THIS ADMISSION:  Right superficial parotidectomy with  facial nerve dissection on April 24, 2003.   DISPOSITION:  The patient is discharged home in stable condition in the  company of her family.   DISCHARGE MEDICATIONS:  1. Vioxx 50 mg p.o. daily for 10 days.  2. Percocet 5/325 mg one to two p.o. q.4-6h. p.r.n.  3. Augmentin 500 mg one tab p.o. b.i.d. for 10 days.   ACTIVITY:  Activities limited, no lifting or straining.   DIET:  Diet is soft.  It can be advanced as tolerated.   WOUND CARE:  Half-strength hydrogen peroxide followed by Bacitracin ointment  on a twice-daily basis.   FOLLOWUP:  The patient is scheduled to follow up in my office on April 30, 2003 at 4 p.m. for initial postoperative recheck.   BRIEF HISTORY:  [name] is a 30 year old black female who presented for  evaluation of a gradually enlarging right parotid mass.  Examination in the  office revealed a fullness in the right periparotid region and MRI scanning  was obtained which showed diffuse enhancement of a 2-cm mass within the mid  and superior aspect of the right parotid gland.  Fine-needle aspiration was  performed and this was considered nondiagnostic.  Given the patient's  history, examination and findings, I recommended to undertake a right  superficial parotidectomy with facial nerve dissection.  The risks, benefits  and possible complications of this procedure were  discussed in detail with  Ms. Tenenbaum and her family and they understood and concurred with my plan for  surgery prior to admission to [facility].   HOSPITAL COURSE:  The patient was admitted to the ENT service under Dr.  Kinnie Scales. Shoemaker's care on April 24, 2003.  She was taken from preop to  the main operating room at [facility] and under general anesthesia,  a right superficial parotidectomy was performed; this was performed with  facial nerve monitoring; there were no complications with the anesthesia or  surgery and she was awakened from her anesthetic and transferred to from the  operating room to the recovery room in stable condition.  From recovery, she  was moved to unit 5700 for postoperative care.  Postoperative course was  uneventful.   On the first postoperative morning, the patient was stable.  She was  tolerating an  oral diet without difficulty, no nausea or vomiting, pain was  well-controlled with the oral Percocet and Vioxx, the incision was dry and  intact without erythema or swelling and the Blake drain output had fallen to  less than 10 mL per shift.  Drain was removed without difficulty and the  patient was discharged to home in stable condition with the above discharge  instructions.  The patient was comfortable with her discharge planning and  will follow up in my office as scheduled above.                                                Kinnie Scales. Annalee Genta, M.D.    DLS/MEDQ  D:  16/06/9603  T:  04/25/2003  Job:  [id number]

## 2011-09-27 ENCOUNTER — Ambulatory Visit (INDEPENDENT_AMBULATORY_CARE_PROVIDER_SITE_OTHER): Payer: BC Managed Care – PPO | Admitting: Family Medicine

## 2011-09-27 ENCOUNTER — Encounter: Payer: Self-pay | Admitting: Family Medicine

## 2011-09-27 VITALS — BP 124/80 | HR 78 | Temp 99.0°F | Wt 263.0 lb

## 2011-09-27 DIAGNOSIS — E669 Obesity, unspecified: Secondary | ICD-10-CM

## 2011-09-27 DIAGNOSIS — I10 Essential (primary) hypertension: Secondary | ICD-10-CM

## 2011-09-27 DIAGNOSIS — R030 Elevated blood-pressure reading, without diagnosis of hypertension: Secondary | ICD-10-CM

## 2011-09-27 MED ORDER — PHENTERMINE HCL 37.5 MG PO TABS: 37.5000 mg | ORAL_TABLET | Freq: Every day | ORAL | Status: DC

## 2011-09-27 NOTE — Progress Notes (Signed)
  Subjective:    Patient ID: [name], female    DOB: [birth year], 31 y.o.   MRN: [id number]  HPI Pt here to discuss weight loss.  No other complaints.  Pt is not exercising and is following low cal diet.     Review of Systems As above    Objective:   Physical Exam  Constitutional: She is oriented to person, place, and time. She appears well-developed and well-nourished.  Cardiovascular: Normal rate and regular rhythm.   No murmur heard. Pulmonary/Chest: Effort normal and breath sounds normal.  Abdominal: Soft. Bowel sounds are normal.  Neurological: She is alert and oriented to person, place, and time.  Psychiatric: She has a normal mood and affect. Her behavior is normal. Judgment and thought content normal.          Assessment & Plan:  Obesity--- adipex daily rto 1 month

## 2011-09-27 NOTE — Patient Instructions (Signed)
Obesity Obesity is defined as having a body mass index (BMI) of 30 or more. To calculate your BMI divide your weight in pounds by your height in inches squared and multiply that product by 703. Major illnesses resulting from long-term obesity include:  Stroke.   Heart disease.   Diabetes.   Many cancers.   Arthritis.  Obesity also complicates recovery from many other medical problems.  CAUSES   A history of obesity in your parents.   Thyroid hormone imbalance.   Environmental factors such as excess calorie intake and physical inactivity.  TREATMENT  A healthy weight loss program includes:  A calorie restricted diet based on individual calorie needs.   Increased physical activity (exercise).  An exercise program is just as important as the right low-calorie diet.  Weight-loss medicines should be used only under the supervision of your physician. These medicines help, but only if they are used with diet and exercise programs. Medicines can have side effects including nervousness, nausea, abdominal pain, diarrhea, headache, drowsiness, and depression.  An unhealthy weight loss program includes:  Fasting.   Fad diets.   Supplements and drugs.  These choices do not succeed in long-term weight control.  HOME CARE INSTRUCTIONS  To help you make the needed dietary changes:   Exercise and perform physical activity as directed by your caregiver.   Keep a daily record of everything you eat. There are many free websites to help you with this. It may be helpful to measure your foods so you can determine if you are eating the correct portion sizes.   Use low-calorie cookbooks or take special cooking classes.   Avoid alcohol. Drink more water and drinks with no calories.   Take vitamins and supplements only as recommended by your caregiver.   Weight loss support groups, Registered Dieticians, counselors, and stress reduction education can also be very helpful.  Document Released:  10/12/2004 Document Revised: 05/17/2011 Document Reviewed: 08/11/2007 Exit[employer] Patient Information 2012 [employer]. 

## 2011-11-29 ENCOUNTER — Telehealth: Payer: Self-pay | Admitting: *Deleted

## 2011-11-29 MED ORDER — FLUCONAZOLE 150 MG PO TABS: 150.0000 mg | ORAL_TABLET | Freq: Once | ORAL | Status: AC

## 2011-11-29 NOTE — Telephone Encounter (Signed)
Ok for Diflucan 150mg x1. 

## 2011-11-29 NOTE — Telephone Encounter (Signed)
[facility] Triage Call Report Triage Record Num: [id number] Operator: Di Kindle Patient Name: [name] Call Date & Time: 11/29/2011 12:36:46PM Patient Phone: [phone] PCP: Lelon Perla Patient Gender: Female PCP Fax : [phone] Patient DOB: 18-May-1981 Practice Name: [facility] Reason for Call: OFFICE PLEASE Caller: Peaches/Patient is calling with a question about RX. States was given ABX by ENT following surgery to remove keloid, LMP 11/24/11, white vaginal discharge onset 11/26/11, afefbrile, states ENT will not write RX for what she thinks is a yeast infection. Guideline: Vaginal discharge, with itching, care advice offered, appt advised, pt it requesting RX to [facility]. Please call with recommendations. OFFICE PLEASE Protocol(s) Used: Vaginal Discharge or Irritation Recommended Outcome per Protocol: See Provider within 24 hours Override Outcome if Used in Protocol: Call Provider Immediately RN Reason for Override Outcome: Office Is Open. Reason for Outcome: Genital itching, burning or redness Care Advice: ~ Call provider if symptoms worsen or new symptoms develop. ~ SYMPTOM / CONDITION MANAGEMENT

## 2011-11-29 NOTE — Telephone Encounter (Signed)
Rx sent, left Pt message Rx sent

## 2011-12-08 ENCOUNTER — Ambulatory Visit (INDEPENDENT_AMBULATORY_CARE_PROVIDER_SITE_OTHER): Payer: BC Managed Care – PPO | Admitting: Family Medicine

## 2011-12-08 ENCOUNTER — Telehealth: Payer: Self-pay | Admitting: Family Medicine

## 2011-12-08 ENCOUNTER — Encounter: Payer: Self-pay | Admitting: Family Medicine

## 2011-12-08 VITALS — BP 128/80 | HR 105 | Temp 98.7°F | Wt 266.8 lb

## 2011-12-08 DIAGNOSIS — R300 Dysuria: Secondary | ICD-10-CM

## 2011-12-08 DIAGNOSIS — B3731 Acute candidiasis of vulva and vagina: Secondary | ICD-10-CM

## 2011-12-08 DIAGNOSIS — B373 Candidiasis of vulva and vagina: Secondary | ICD-10-CM

## 2011-12-08 LAB — POCT URINALYSIS DIPSTICK
Bilirubin, UA: NEGATIVE
Glucose, UA: NEGATIVE
Ketones, UA: NEGATIVE
Nitrite, UA: NEGATIVE
Protein, UA: NEGATIVE
Spec Grav, UA: 1.025
Urobilinogen, UA: 0.2
pH, UA: 6.5

## 2011-12-08 MED ORDER — CIPROFLOXACIN HCL 500 MG PO TABS: 500.0000 mg | ORAL_TABLET | Freq: Two times a day (BID) | ORAL | Status: AC

## 2011-12-08 MED ORDER — FLUCONAZOLE 150 MG PO TABS: ORAL_TABLET | ORAL | Status: DC

## 2011-12-08 NOTE — Progress Notes (Signed)
  Subjective:    [name] is a 31 y.o. female who complains of abnormal smelling urine, burning with urination, dysuria, foul smelling urine, frequency and suprapubic pressure. She has had symptoms for 3 days. Patient also complains of none. Patient denies back pain, congestion, cough, fever, headache, rhinitis, sorethroat, stomach ache and vaginal discharge. Patient does not have a history of recurrent UTI. Patient does not have a history of pyelonephritis.   The following portions of the patient's history were reviewed and updated as appropriate: allergies, current medications, past family history, past medical history, past social history, past surgical history and problem list.  Review of Systems Pertinent items are noted in HPI.    Objective:    BP 128/80  Pulse 105  Temp(Src) 98.7 F (37.1 C) (Oral)  Wt 266 lb 12.8 oz (121.02 kg)  SpO2 97% General appearance: alert, cooperative, appears stated age and no distress Neurologic: Grossly normal  Laboratory:  Urine dipstick: large for hemoglobin and 2+ for leukocyte esterase.   Micro exam: not done.    Assessment:    dysuria     Plan:    Medications: ciprofloxacin. Maintain adequate hydration. Follow up if symptoms not improving, and as needed. culture done

## 2011-12-08 NOTE — Telephone Encounter (Signed)
Patient was seen today at 59.     KP

## 2011-12-08 NOTE — Telephone Encounter (Signed)
Caller: [place]/Patient; PCP: Lelon Perla.; CB#: [phone]; Call regarding Urinary Pain; onset 12/06/11, denies bleeding, pain in lower abd. Guideline: Urinary, with symptoms, appt advised, no appt in EPIC, call to office: Judeth Cornfield, advised UC or be seen at [facility] office 12/09/11.

## 2011-12-08 NOTE — Patient Instructions (Signed)

## 2011-12-08 NOTE — Telephone Encounter (Signed)
We should be able to see her quickly-----

## 2011-12-11 LAB — URINE CULTURE: Colony Count: >=100000

## 2012-02-20 DIAGNOSIS — Z85818 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, oral cavity, and pharynx: Secondary | ICD-10-CM | POA: Insufficient documentation

## 2012-02-20 DIAGNOSIS — L910 Hypertrophic scar: Secondary | ICD-10-CM | POA: Insufficient documentation

## 2012-03-28 ENCOUNTER — Telehealth: Payer: Self-pay | Admitting: Family Medicine

## 2012-03-28 MED ORDER — DESOGESTREL-ETHINYL ESTRADIOL 0.15-0.02/0.01 MG (21/5) PO TABS: 1.0000 | ORAL_TABLET | ORAL | Status: DC

## 2012-03-28 NOTE — Telephone Encounter (Signed)
Please call back when you would not specify why just said she wanted to speak with you before ov tomorrow Call back [phone]

## 2012-03-28 NOTE — Telephone Encounter (Signed)
msg left to call the office     KP 

## 2012-03-29 ENCOUNTER — Telehealth: Payer: Self-pay | Admitting: Family Medicine

## 2012-03-29 ENCOUNTER — Ambulatory Visit: Payer: BC Managed Care – PPO | Admitting: Family Medicine

## 2012-03-29 NOTE — Telephone Encounter (Signed)
Spoke with patient this morning and confirmed CPE 9.6.13, cancelled appt for today

## 2012-03-29 NOTE — Telephone Encounter (Signed)
Error

## 2012-04-09 ENCOUNTER — Encounter: Payer: Self-pay | Admitting: Family Medicine

## 2012-04-09 ENCOUNTER — Ambulatory Visit (INDEPENDENT_AMBULATORY_CARE_PROVIDER_SITE_OTHER): Payer: BC Managed Care – PPO | Admitting: Family Medicine

## 2012-04-09 VITALS — BP 122/84 | HR 86 | Temp 98.9°F | Wt 265.6 lb

## 2012-04-09 DIAGNOSIS — T753XXA Motion sickness, initial encounter: Secondary | ICD-10-CM

## 2012-04-09 DIAGNOSIS — J029 Acute pharyngitis, unspecified: Secondary | ICD-10-CM

## 2012-04-09 LAB — POCT RAPID STREP A (OFFICE): Rapid Strep A Screen: NEGATIVE

## 2012-04-09 MED ORDER — PENICILLIN G BENZATHINE 1200000 UNIT/2ML IM SUSP
1.2000 10*6.[IU] | Freq: Once | INTRAMUSCULAR | Status: AC
  Administered 2012-04-09: 1.2 10*6.[IU] via INTRAMUSCULAR

## 2012-04-09 MED ORDER — SCOPOLAMINE 1 MG/3DAYS TD PT72: 1.0000 | MEDICATED_PATCH | TRANSDERMAL | Status: DC

## 2012-04-09 MED ORDER — PREDNISONE 10 MG PO TABS: ORAL_TABLET | ORAL | Status: DC

## 2012-04-09 NOTE — Patient Instructions (Signed)
Strep Infections  Streptococcal (strep) infections are caused by streptococcal germs (bacteria). Strep infections are very contagious. Strep infections can occur in:   Ears.   The nose.   The throat.   Sinuses.   Skin.   Blood.   Lungs.   Spinal fluid.   Urine.  Strep throat is the most common bacterial infection in children. The symptoms of a Strep infection usually get better in 2 to 3 days after starting medicine that kills germs (antibiotics). Strep is usually not contagious after 36 to 48 hours of antibiotic treatment. Strep infections that are not treated can cause serious complications. These include gland infections, throat abscess, rheumatic fever and kidney disease.  DIAGNOSIS   The diagnosis of strep is made by:   A culture for the strep germ.  TREATMENT   These infections require oral antibiotics for a full 10 days, an antibiotic shot or antibiotics given into the vein (intravenous, IV).  HOME CARE INSTRUCTIONS    Be sure to finish all antibiotics even if feeling better.   Only take over-the-counter medicines for pain, discomfort and or fever, as directed by your caregiver.   Close contacts that have a fever, sore throat or illness symptoms should see their caregiver right away.   You or your child may return to work, school or daycare if the fever and pain are better in 2 to 3 days after starting antibiotics.  SEEK MEDICAL CARE IF:    You or your child has an oral temperature above 102 F (38.9 C).   Your baby is older than 3 months with a rectal temperature of 100.5 F (38.1 C) or higher for more than 1 day.   You or your child is not better in 3 days.  SEEK IMMEDIATE MEDICAL CARE IF:    You or your child has an oral temperature above 102 F (38.9 C), not controlled by medicine.   Your baby is older than 3 months with a rectal temperature of 102 F (38.9 C) or higher.   Your baby is 3 months old or younger with a rectal temperature of 100.4 F (38 C) or higher.   There is a  spreading rash.   There is difficulty swallowing or breathing.   There is increased pain or swelling.  Document Released: 10/12/2004 Document Revised: 08/24/2011 Document Reviewed: 07/21/2009  Exit[employer] Patient Information 2012 [employer].

## 2012-04-09 NOTE — Progress Notes (Signed)
  Subjective:     [name] is a 31 y.o. female who presents for evaluation of sore throat. Associated symptoms include post nasal drip, sore throat and ear pain. Onset of symptoms was 6 days ago, and have been gradually worsening since that time. She is drinking plenty of fluids. She has not had a recent close exposure to someone with proven streptococcal pharyngitis.  The following portions of the patient's history were reviewed and updated as appropriate: allergies, current medications, past family history, past medical history, past social history, past surgical history and problem list.  Review of Systems Pertinent items are noted in HPI.    Objective:    BP 122/84  Pulse 86  Temp 98.9 F (37.2 C) (Oral)  Wt 265 lb 9.6 oz (120.475 kg)  SpO2 98% General appearance: alert, cooperative, appears stated age and no distress Ears: normal TM's and external ear canals both ears Nose: Nares normal. Septum midline. Mucosa normal. No drainage or sinus tenderness. Throat: abnormal findings: exudates present and marked oropharyngeal erythema Neck: mild anterior cervical adenopathy and thyroid not enlarged, symmetric, no tenderness/mass/nodules Lungs: clear to auscultation bilaterally  Laboratory Strep test done. Results:would not develop--culture done.    Assessment:    Acute pharyngitis, likely  Bacterial tonsillitis R/O strep.    Plan:    Patient placed on antibiotics. Use of OTC analgesics recommended as well as salt water gargles. Patient advised of the risk of peritonsillar abscess formation. Follow up as needed.

## 2012-04-11 ENCOUNTER — Encounter: Payer: Self-pay | Admitting: Family Medicine

## 2012-04-11 LAB — CULTURE, GROUP A STREP: Organism ID, Bacteria: NORMAL

## 2012-04-12 ENCOUNTER — Other Ambulatory Visit: Payer: Self-pay | Admitting: Family Medicine

## 2012-04-12 DIAGNOSIS — B373 Candidiasis of vulva and vagina: Secondary | ICD-10-CM

## 2012-04-12 DIAGNOSIS — B3731 Acute candidiasis of vulva and vagina: Secondary | ICD-10-CM

## 2012-04-12 MED ORDER — FLUCONAZOLE 150 MG PO TABS: ORAL_TABLET | ORAL | Status: DC

## 2012-04-12 NOTE — Telephone Encounter (Signed)
The medication for motion sickness is too expensive, would you like to change Rx . Please advise    KP

## 2012-04-15 ENCOUNTER — Encounter: Payer: Self-pay | Admitting: Family Medicine

## 2012-04-15 DIAGNOSIS — F32A Depression, unspecified: Secondary | ICD-10-CM

## 2012-04-15 DIAGNOSIS — F329 Major depressive disorder, single episode, unspecified: Secondary | ICD-10-CM

## 2012-04-16 ENCOUNTER — Encounter: Payer: Self-pay | Admitting: Family Medicine

## 2012-04-16 MED ORDER — FLUOXETINE HCL 10 MG PO CAPS: 10.0000 mg | ORAL_CAPSULE | Freq: Every day | ORAL | Status: DC

## 2012-05-24 ENCOUNTER — Encounter: Payer: BC Managed Care – PPO | Admitting: Family Medicine

## 2012-07-19 ENCOUNTER — Encounter: Payer: Self-pay | Admitting: Family Medicine

## 2012-07-19 ENCOUNTER — Other Ambulatory Visit (HOSPITAL_COMMUNITY)
Admission: RE | Admit: 2012-07-19 | Discharge: 2012-07-19 | Disposition: A | Discharge status: Home / Self Care | Payer: BC Managed Care – PPO | Source: Ambulatory Visit | Attending: Family Medicine | Admitting: Family Medicine

## 2012-07-19 ENCOUNTER — Ambulatory Visit (INDEPENDENT_AMBULATORY_CARE_PROVIDER_SITE_OTHER): Payer: BC Managed Care – PPO | Admitting: Family Medicine

## 2012-07-19 VITALS — BP 124/82 | HR 77 | Temp 98.3°F | Ht 67.0 in | Wt 251.4 lb

## 2012-07-19 DIAGNOSIS — A6000 Herpesviral infection of urogenital system, unspecified: Secondary | ICD-10-CM

## 2012-07-19 DIAGNOSIS — Z113 Encounter for screening for infections with a predominantly sexual mode of transmission: Secondary | ICD-10-CM | POA: Insufficient documentation

## 2012-07-19 DIAGNOSIS — F32A Depression, unspecified: Secondary | ICD-10-CM

## 2012-07-19 DIAGNOSIS — Z124 Encounter for screening for malignant neoplasm of cervix: Secondary | ICD-10-CM

## 2012-07-19 DIAGNOSIS — Z0000 Encounter for general adult medical examination without abnormal findings: Secondary | ICD-10-CM

## 2012-07-19 DIAGNOSIS — Z01419 Encounter for gynecological examination (general) (routine) without abnormal findings: Secondary | ICD-10-CM | POA: Insufficient documentation

## 2012-07-19 DIAGNOSIS — F419 Anxiety disorder, unspecified: Secondary | ICD-10-CM

## 2012-07-19 DIAGNOSIS — N760 Acute vaginitis: Secondary | ICD-10-CM | POA: Insufficient documentation

## 2012-07-19 DIAGNOSIS — F341 Dysthymic disorder: Secondary | ICD-10-CM

## 2012-07-19 LAB — POCT URINALYSIS DIPSTICK
Bilirubin, UA: NEGATIVE
Blood, UA: NEGATIVE
Glucose, UA: NEGATIVE
Ketones, UA: NEGATIVE
Leukocytes, UA: NEGATIVE
Nitrite, UA: NEGATIVE
Protein, UA: NEGATIVE
Spec Grav, UA: 1.01
Urobilinogen, UA: 0.2
pH, UA: 6

## 2012-07-19 MED ORDER — FLUOXETINE HCL 20 MG PO TABS: 20.0000 mg | ORAL_TABLET | Freq: Every day | ORAL | Status: DC

## 2012-07-19 MED ORDER — VALACYCLOVIR HCL 1 G PO TABS: 1000.0000 mg | ORAL_TABLET | Freq: Every day | ORAL | Status: DC

## 2012-07-19 MED ORDER — ALPRAZOLAM 0.25 MG PO TABS: 0.2500 mg | ORAL_TABLET | Freq: Three times a day (TID) | ORAL | Status: DC | PRN

## 2012-07-19 NOTE — Assessment & Plan Note (Signed)
Refill valtrex 

## 2012-07-19 NOTE — Patient Instructions (Addendum)
Genital Herpes Genital herpes is a sexually transmitted disease. This means that it is a disease passed by having sex with an infected person. There is no cure for genital herpes. The time between attacks can be months to years. The virus may live in a person but produce no problems (symptoms). This infection can be passed to a baby as it travels down the birth canal (vagina). In a newborn, this can cause central nervous system damage, eye damage, or even death. The virus that causes genital herpes is usually HSV-2 virus. The virus that causes oral herpes is usually HSV-1. The diagnosis (learning what is wrong) is made through culture results. SYMPTOMS  Usually symptoms of pain and itching begin a few days to a week after contact. It first appears as small blisters that progress to small painful ulcers which then scab over and heal after several days. It affects the outer genitalia, birth canal, cervix, penis, anal area, buttocks, and thighs. HOME CARE INSTRUCTIONS   Keep ulcerated areas dry and clean.  Take medications as directed. Antiviral medications can speed up healing. They will not prevent recurrences or cure this infection. These medications can also be taken for suppression if there are frequent recurrences.  While the infection is active, it is contagious. Avoid all sexual contact during active infections.  Condoms may help prevent spread of the herpes virus.  Practice safe sex.  Wash your hands thoroughly after touching the genital area.  Avoid touching your eyes after touching your genital area.  Inform your caregiver if you have had genital herpes and become pregnant. It is your responsibility to insure a safe outcome for your baby in this pregnancy.  Only take over-the-counter or prescription medicines for pain, discomfort, or fever as directed by your caregiver. SEEK MEDICAL CARE IF:   You have a recurrence of this infection.  You do not respond to medications and are not  improving.  You have new sources of pain or discharge which have changed from the original infection.  You have an oral temperature above 102 F (38.9 C).  You develop abdominal pain.  You develop eye pain or signs of eye infection. Document Released: 09/01/2000 Document Revised: 11/27/2011 Document Reviewed: 09/22/2009 [facility] Patient Information 2013 [place], [place].  Preventive Care for Adults, Female A healthy lifestyle and preventive care can promote health and wellness. Preventive health guidelines for women include the following key practices.  A routine yearly physical is a good way to check with your caregiver about your health and preventive screening. It is a chance to share any concerns and updates on your health, and to receive a thorough exam.  Visit your dentist for a routine exam and preventive care every 6 months. Brush your teeth twice a day and floss once a day. Good oral hygiene prevents tooth decay and gum disease.  The frequency of eye exams is based on your age, health, family medical history, use of contact lenses, and other factors. Follow your caregiver's recommendations for frequency of eye exams.  Eat a healthy diet. Foods like vegetables, fruits, whole grains, low-fat dairy products, and lean protein foods contain the nutrients you need without too many calories. Decrease your intake of foods high in solid fats, added sugars, and salt. Eat the right amount of calories for you.Get information about a proper diet from your caregiver, if necessary.  Regular physical exercise is one of the most important things you can do for your health. Most adults should get at least 150 minutes  of moderate-intensity exercise (any activity that increases your heart rate and causes you to sweat) each week. In addition, most adults need muscle-strengthening exercises on 2 or more days a week.  Maintain a healthy weight. The body mass index (BMI) is a screening tool to identify  possible weight problems. It provides an estimate of body fat based on height and weight. Your caregiver can help determine your BMI, and can help you achieve or maintain a healthy weight.For adults 20 years and older:  A BMI below 18.5 is considered underweight.  A BMI of 18.5 to 24.9 is normal.  A BMI of 25 to 29.9 is considered overweight.  A BMI of 30 and above is considered obese.  Maintain normal blood lipids and cholesterol levels by exercising and minimizing your intake of saturated fat. Eat a balanced diet with plenty of fruit and vegetables. Blood tests for lipids and cholesterol should begin at age 25 and be repeated every 5 years. If your lipid or cholesterol levels are high, you are over 50, or you are at high risk for heart disease, you may need your cholesterol levels checked more frequently.Ongoing high lipid and cholesterol levels should be treated with medicines if diet and exercise are not effective.  If you smoke, find out from your caregiver how to quit. If you do not use tobacco, do not start.  If you are pregnant, do not drink alcohol. If you are breastfeeding, be very cautious about drinking alcohol. If you are not pregnant and choose to drink alcohol, do not exceed 1 drink per day. One drink is considered to be 12 ounces (355 mL) of beer, 5 ounces (148 mL) of wine, or 1.5 ounces (44 mL) of liquor.  Avoid use of street drugs. Do not share needles with anyone. Ask for help if you need support or instructions about stopping the use of drugs.  High blood pressure causes heart disease and increases the risk of stroke. Your blood pressure should be checked at least every 1 to 2 years. Ongoing high blood pressure should be treated with medicines if weight loss and exercise are not effective.  If you are 85 to 31 years old, ask your caregiver if you should take aspirin to prevent strokes.  Diabetes screening involves taking a blood sample to check your fasting blood sugar  level. This should be done once every 3 years, after age 18, if you are within normal weight and without risk factors for diabetes. Testing should be considered at a younger age or be carried out more frequently if you are overweight and have at least 1 risk factor for diabetes.  Breast cancer screening is essential preventive care for women. You should practice "breast self-awareness." This means understanding the normal appearance and feel of your breasts and may include breast self-examination. Any changes detected, no matter how small, should be reported to a caregiver. Women in their 69s and 30s should have a clinical breast exam (CBE) by a caregiver as part of a regular health exam every 1 to 3 years. After age 88, women should have a CBE every year. Starting at age 39, women should consider having a mammography (breast X-ray test) every year. Women who have a family history of breast cancer should talk to their caregiver about genetic screening. Women at a high risk of breast cancer should talk to their caregivers about having magnetic resonance imaging (MRI) and a mammography every year.  The Pap test is a screening test for cervical cancer.  A Pap test can show cell changes on the cervix that might become cervical cancer if left untreated. A Pap test is a procedure in which cells are obtained and examined from the lower end of the uterus (cervix).  Women should have a Pap test starting at age 29.  Between ages 44 and 39, Pap tests should be repeated every 2 years.  Beginning at age 26, you should have a Pap test every 3 years as long as the past 3 Pap tests have been normal.  Some women have medical problems that increase the chance of getting cervical cancer. Talk to your caregiver about these problems. It is especially important to talk to your caregiver if a new problem develops soon after your last Pap test. In these cases, your caregiver may recommend more frequent screening and Pap  tests.  The above recommendations are the same for women who have or have not gotten the vaccine for human papillomavirus (HPV).  If you had a hysterectomy for a problem that was not cancer or a condition that could lead to cancer, then you no longer need Pap tests. Even if you no longer need a Pap test, a regular exam is a good idea to make sure no other problems are starting.  If you are between ages 24 and 8, and you have had normal Pap tests going back 10 years, you no longer need Pap tests. Even if you no longer need a Pap test, a regular exam is a good idea to make sure no other problems are starting.  If you have had past treatment for cervical cancer or a condition that could lead to cancer, you need Pap tests and screening for cancer for at least 20 years after your treatment.  If Pap tests have been discontinued, risk factors (such as a new sexual partner) need to be reassessed to determine if screening should be resumed.  The HPV test is an additional test that may be used for cervical cancer screening. The HPV test looks for the virus that can cause the cell changes on the cervix. The cells collected during the Pap test can be tested for HPV. The HPV test could be used to screen women aged 28 years and older, and should be used in women of any age who have unclear Pap test results. After the age of 54, women should have HPV testing at the same frequency as a Pap test.  Colorectal cancer can be detected and often prevented. Most routine colorectal cancer screening begins at the age of 54 and continues through age 77. However, your caregiver may recommend screening at an earlier age if you have risk factors for colon cancer. On a yearly basis, your caregiver may provide home test kits to check for hidden blood in the stool. Use of a small camera at the end of a tube, to directly examine the colon (sigmoidoscopy or colonoscopy), can detect the earliest forms of colorectal cancer. Talk to your  caregiver about this at age 42, when routine screening begins. Direct examination of the colon should be repeated every 5 to 10 years through age 10, unless early forms of pre-cancerous polyps or small growths are found.  Hepatitis C blood testing is recommended for all people born from [birth year] through 1965 and any individual with known risks for hepatitis C.  Practice safe sex. Use condoms and avoid high-risk sexual practices to reduce the spread of sexually transmitted infections (STIs). STIs include gonorrhea, chlamydia, syphilis, trichomonas, herpes, HPV,  and human immunodeficiency virus (HIV). Herpes, HIV, and HPV are viral illnesses that have no cure. They can result in disability, cancer, and death. Sexually active women aged 8 and younger should be checked for chlamydia. Older women with new or multiple partners should also be tested for chlamydia. Testing for other STIs is recommended if you are sexually active and at increased risk.  Osteoporosis is a disease in which the bones lose minerals and strength with aging. This can result in serious bone fractures. The risk of osteoporosis can be identified using a bone density scan. Women ages 32 and over and women at risk for fractures or osteoporosis should discuss screening with their caregivers. Ask your caregiver whether you should take a calcium supplement or vitamin D to reduce the rate of osteoporosis.  Menopause can be associated with physical symptoms and risks. Hormone replacement therapy is available to decrease symptoms and risks. You should talk to your caregiver about whether hormone replacement therapy is right for you.  Use sunscreen with sun protection factor (SPF) of 30 or more. Apply sunscreen liberally and repeatedly throughout the day. You should seek shade when your shadow is shorter than you. Protect yourself by wearing long sleeves, pants, a wide-brimmed hat, and sunglasses year round, whenever you are outdoors.  Once a month,  do a whole body skin exam, using a mirror to look at the skin on your back. Notify your caregiver of new moles, moles that have irregular borders, moles that are larger than a pencil eraser, or moles that have changed in shape or color.  Stay current with required immunizations.  Influenza. You need a dose every fall (or winter). The composition of the flu vaccine changes each year, so being vaccinated once is not enough.  Pneumococcal polysaccharide. You need 1 to 2 doses if you smoke cigarettes or if you have certain chronic medical conditions. You need 1 dose at age 58 (or older) if you have never been vaccinated.  Tetanus, diphtheria, pertussis (Tdap, Td). Get 1 dose of Tdap vaccine if you are younger than age 46, are over 59 and have contact with an infant, are a Research scientist (physical sciences), are pregnant, or simply want to be protected from whooping cough. After that, you need a Td booster dose every 10 years. Consult your caregiver if you have not had at least 3 tetanus and diphtheria-containing shots sometime in your life or have a deep or dirty wound.  HPV. You need this vaccine if you are a woman age 26 or younger. The vaccine is given in 3 doses over 6 months.  Measles, mumps, rubella (MMR). You need at least 1 dose of MMR if you were born in 1957 or later. You may also need a second dose.  Meningococcal. If you are age 31 to 48 and a first-year college student living in a residence hall, or have one of several medical conditions, you need to get vaccinated against meningococcal disease. You may also need additional booster doses.  Zoster (shingles). If you are age 50 or older, you should get this vaccine.  Varicella (chickenpox). If you have never had chickenpox or you were vaccinated but received only 1 dose, talk to your caregiver to find out if you need this vaccine.  Hepatitis A. You need this vaccine if you have a specific risk factor for hepatitis A virus infection or you simply wish to be  protected from this disease. The vaccine is usually given as 2 doses, 6 to 18 months apart.  Hepatitis B. You need this vaccine if you have a specific risk factor for hepatitis B virus infection or you simply wish to be protected from this disease. The vaccine is given in 3 doses, usually over 6 months. Preventive Services / Frequency Ages 40 to 7  Blood pressure check.** / Every 1 to 2 years.  Lipid and cholesterol check.** / Every 5 years beginning at age 72.  Clinical breast exam.** / Every 3 years for women in their 86s and 30s.  Pap test.** / Every 2 years from ages 41 through 77. Every 3 years starting at age 38 through age 45 or 8 with a history of 3 consecutive normal Pap tests.  HPV screening.** / Every 3 years from ages 32 through ages 74 to 70 with a history of 3 consecutive normal Pap tests.  Hepatitis C blood test.** / For any individual with known risks for hepatitis C.  Skin self-exam. / Monthly.  Influenza immunization.** / Every year.  Pneumococcal polysaccharide immunization.** / 1 to 2 doses if you smoke cigarettes or if you have certain chronic medical conditions.  Tetanus, diphtheria, pertussis (Tdap, Td) immunization. / A one-time dose of Tdap vaccine. After that, you need a Td booster dose every 10 years.  HPV immunization. / 3 doses over 6 months, if you are 19 and younger.  Measles, mumps, rubella (MMR) immunization. / You need at least 1 dose of MMR if you were born in 1957 or later. You may also need a second dose.  Meningococcal immunization. / 1 dose if you are age 29 to 40 and a first-year college student living in a residence hall, or have one of several medical conditions, you need to get vaccinated against meningococcal disease. You may also need additional booster doses.  Varicella immunization.** / Consult your caregiver.  Hepatitis A immunization.** / Consult your caregiver. 2 doses, 6 to 18 months apart.  Hepatitis B immunization.** / Consult  your caregiver. 3 doses usually over 6 months. Ages 71 to 78  Blood pressure check.** / Every 1 to 2 years.  Lipid and cholesterol check.** / Every 5 years beginning at age 39.  Clinical breast exam.** / Every year after age 26.  Mammogram.** / Every year beginning at age 77 and continuing for as long as you are in good health. Consult with your caregiver.  Pap test.** / Every 3 years starting at age 10 through age 23 or 14 with a history of 3 consecutive normal Pap tests.  HPV screening.** / Every 3 years from ages 78 through ages 15 to 11 with a history of 3 consecutive normal Pap tests.  Fecal occult blood test (FOBT) of stool. / Every year beginning at age 21 and continuing until age 55. You may not need to do this test if you get a colonoscopy every 10 years.  Flexible sigmoidoscopy or colonoscopy.** / Every 5 years for a flexible sigmoidoscopy or every 10 years for a colonoscopy beginning at age 28 and continuing until age 4.  Hepatitis C blood test.** / For all people born from [birth year] through 1965 and any individual with known risks for hepatitis C.  Skin self-exam. / Monthly.  Influenza immunization.** / Every year.  Pneumococcal polysaccharide immunization.** / 1 to 2 doses if you smoke cigarettes or if you have certain chronic medical conditions.  Tetanus, diphtheria, pertussis (Tdap, Td) immunization.** / A one-time dose of Tdap vaccine. After that, you need a Td booster dose every 10 years.  Measles, mumps, rubella (  MMR) immunization. / You need at least 1 dose of MMR if you were born in 1957 or later. You may also need a second dose.  Varicella immunization.** / Consult your caregiver.  Meningococcal immunization.** / Consult your caregiver.  Hepatitis A immunization.** / Consult your caregiver. 2 doses, 6 to 18 months apart.  Hepatitis B immunization.** / Consult your caregiver. 3 doses, usually over 6 months. Ages 75 and over  Blood pressure check.** / Every 1 to  2 years.  Lipid and cholesterol check.** / Every 5 years beginning at age 78.  Clinical breast exam.** / Every year after age 42.  Mammogram.** / Every year beginning at age 55 and continuing for as long as you are in good health. Consult with your caregiver.  Pap test.** / Every 3 years starting at age 33 through age 20 or 64 with a 3 consecutive normal Pap tests. Testing can be stopped between 65 and 70 with 3 consecutive normal Pap tests and no abnormal Pap or HPV tests in the past 10 years.  HPV screening.** / Every 3 years from ages 64 through ages 83 or 28 with a history of 3 consecutive normal Pap tests. Testing can be stopped between 65 and 70 with 3 consecutive normal Pap tests and no abnormal Pap or HPV tests in the past 10 years.  Fecal occult blood test (FOBT) of stool. / Every year beginning at age 47 and continuing until age 17. You may not need to do this test if you get a colonoscopy every 10 years.  Flexible sigmoidoscopy or colonoscopy.** / Every 5 years for a flexible sigmoidoscopy or every 10 years for a colonoscopy beginning at age 30 and continuing until age 38.  Hepatitis C blood test.** / For all people born from [birth year] through 1965 and any individual with known risks for hepatitis C.  Osteoporosis screening.** / A one-time screening for women ages 65 and over and women at risk for fractures or osteoporosis.  Skin self-exam. / Monthly.  Influenza immunization.** / Every year.  Pneumococcal polysaccharide immunization.** / 1 dose at age 54 (or older) if you have never been vaccinated.  Tetanus, diphtheria, pertussis (Tdap, Td) immunization. / A one-time dose of Tdap vaccine if you are over 65 and have contact with an infant, are a Research scientist (physical sciences), or simply want to be protected from whooping cough. After that, you need a Td booster dose every 10 years.  Varicella immunization.** / Consult your caregiver.  Meningococcal immunization.** / Consult your  caregiver.  Hepatitis A immunization.** / Consult your caregiver. 2 doses, 6 to 18 months apart.  Hepatitis B immunization.** / Check with your caregiver. 3 doses, usually over 6 months. ** Family history and personal history of risk and conditions may change your caregiver's recommendations. Document Released: 10/31/2001 Document Revised: 11/27/2011 Document Reviewed: 01/30/2011 [facility] Patient Information 2013 [place], [place].

## 2012-07-19 NOTE — Progress Notes (Signed)
Subjective:     [name] is a 31 y.o. female and is here for a comprehensive physical exam. 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  History   Social History  . Marital Status: Single    Spouse Name: N/A    Number of Children: N/A  . Years of Education: N/A   Occupational History  . [employer]-- teach biology    Social History Main Topics  . Smoking status: Never Smoker   . Smokeless tobacco: Never Used  . Alcohol Use: Yes  . Drug Use: No  . Sexually Active: Not Currently -- Female partner(s)   Other Topics Concern  . Not on file   Social History Narrative  . No narrative on file   Health Maintenance  Topic Date Due  . Influenza Vaccine  05/19/2012  . Pap Smear  11/10/2013  . Tetanus/tdap  11/10/2020    The following portions of the patient's history were reviewed and updated as appropriate:  She  has a past medical history of Anemia; Thyroid disease; Asthma; Obesity; and Genital herpes. She  does not have any pertinent problems on file. She  has past surgical history that includes Keloid excision (2008) and Salivary gland surgery. Her family history includes Cancer in her maternal grandfather and paternal grandfather; Cancer (age of onset:50) in her father; Coronary artery disease in an unspecified family member; Depression in her father; Heart disease (age of onset:50) in her father; Hyperlipidemia in her father and mother; Hypertension in her father and mother; and Prostate cancer in an unspecified family member. She  reports that she has never smoked. She has never used smokeless tobacco. She reports that she drinks alcohol. She reports that she does not use illicit drugs. She has a current medication list which includes the following prescription(s): albuterol, alprazolam, fluoxetine, and valacyclovir. Current Outpatient Prescriptions on File Prior to Visit  Medication Sig Dispense Refill  . albuterol (PROAIR HFA) 108 (90 BASE) MCG/ACT  inhaler Inhale 2 puffs into the lungs every 6 (six) hours as needed.  6.7 g  1   She  has no known allergies..  Review of Systems Review of Systems  Constitutional: Negative for activity change, appetite change and fatigue.  HENT: Negative for hearing loss, congestion, tinnitus and ear discharge.  dentist --last year Eyes: Negative for visual disturbance (see optho q1y -- vision corrected to 20/20 with glasses).  Respiratory: Negative for cough, chest tightness and shortness of breath.   Cardiovascular: Negative for chest pain, palpitations and leg swelling.  Gastrointestinal: Negative for abdominal pain, diarrhea, constipation and abdominal distention.  Genitourinary: Negative for urgency, frequency, decreased urine volume and difficulty urinating.  Musculoskeletal: Negative for back pain, arthralgias and gait problem.  Skin: Negative for color change, pallor and rash.  Neurological: Negative for dizziness, light-headedness, numbness and headaches.  Hematological: Negative for adenopathy. Does not bruise/bleed easily.  Psychiatric/Behavioral: Negative for suicidal ideas, confusion, sleep disturbance, self-injury, dysphoric mood, decreased concentration and agitation.      Objective:    BP 124/82  Pulse 77  Temp 98.3 F (36.8 C) (Oral)  Ht 5\' 7"  (1.702 m)  Wt 251 lb 6.4 oz (114.034 kg)  BMI 39.37 kg/m2  SpO2 96%  LMP 07/08/2012 General appearance: alert, cooperative, appears stated age and no distress Head: Normocephalic, without obvious abnormality, atraumatic Eyes: conjunctivae/corneas clear. PERRL, EOM&#39;s intact. Fundi benign. Ears: normal TM's and external ear canals both ears Nose: Nares normal. Septum midline. Mucosa normal.  No drainage or sinus tenderness. Throat: lips, mucosa, and tongue normal; teeth and gums normal Neck: no adenopathy, no carotid bruit, no JVD, supple, symmetrical, trachea midline and thyroid not enlarged, symmetric, no  tenderness/mass/nodules Back: symmetric, no curvature. ROM normal. No CVA tenderness. Lungs: clear to auscultation bilaterally Breasts: normal appearance, no masses or tenderness Heart: regular rate and rhythm, S1, S2 normal, no murmur, click, rub or gallop Abdomen: soft, non-tender; bowel sounds normal; no masses,  no organomegaly Pelvic: cervix normal in appearance, external genitalia normal, no adnexal masses or tenderness, no cervical motion tenderness, rectovaginal septum normal, uterus normal size, shape, and consistency and vagina normal without discharge Extremities: extremities normal, atraumatic, no cyanosis or edema Pulses: 2+ and symmetric Skin: Skin color, texture, turgor normal. No rashes or lesions Lymph nodes: Cervical, supraclavicular, and axillary nodes normal. Neurologic: Alert and oriented X 3, normal strength and tone. Normal symmetric reflexes. Normal coordination and gait psych-- pt crying because of recent dx HSVII but is not suicidal or homicidal    Assessment:    Healthy female exam.      Plan:    check fasting labs ghm utd See After Visit Summary for Counseling Recommendations

## 2012-07-26 ENCOUNTER — Other Ambulatory Visit (INDEPENDENT_AMBULATORY_CARE_PROVIDER_SITE_OTHER): Payer: BC Managed Care – PPO

## 2012-07-26 DIAGNOSIS — Z0000 Encounter for general adult medical examination without abnormal findings: Secondary | ICD-10-CM

## 2012-07-26 LAB — HEPATIC FUNCTION PANEL
ALT: 12 U/L (ref 0–35)
AST: 15 U/L (ref 0–37)
Albumin: 3.3 g/dL — ABNORMAL LOW (ref 3.5–5.2)
Alkaline Phosphatase: 127 U/L — ABNORMAL HIGH (ref 39–117)
Bilirubin, Direct: 0.1 mg/dL (ref 0.0–0.3)
Total Bilirubin: 0.4 mg/dL (ref 0.3–1.2)
Total Protein: 7.3 g/dL (ref 6.0–8.3)

## 2012-07-26 LAB — BASIC METABOLIC PANEL
BUN: 6 mg/dL (ref 6–23)
CO2: 27 mEq/L (ref 19–32)
Calcium: 8.9 mg/dL (ref 8.4–10.5)
Chloride: 105 mEq/L (ref 96–112)
Creatinine, Ser: 0.8 mg/dL (ref 0.4–1.2)
GFR: 115.73 mL/min (ref >60.00)
Glucose, Bld: 81 mg/dL (ref 70–99)
Potassium: 3.4 mEq/L — ABNORMAL LOW (ref 3.5–5.1)
Sodium: 140 mEq/L (ref 135–145)

## 2012-07-26 LAB — LIPID PANEL
Cholesterol: 142 mg/dL (ref 0–200)
HDL: 44.4 mg/dL (ref >39.00)
LDL Cholesterol: 88 mg/dL (ref 0–99)
Total CHOL/HDL Ratio: 3
Triglycerides: 47 mg/dL (ref 0.0–149.0)
VLDL: 9.4 mg/dL (ref 0.0–40.0)

## 2012-07-26 LAB — TSH: TSH: 1.66 u[IU]/mL (ref 0.35–5.50)

## 2012-09-17 ENCOUNTER — Other Ambulatory Visit: Payer: Self-pay | Admitting: Obstetrics and Gynecology

## 2012-09-17 DIAGNOSIS — N6320 Unspecified lump in the left breast, unspecified quadrant: Secondary | ICD-10-CM

## 2012-09-24 ENCOUNTER — Ambulatory Visit
Admission: RE | Admit: 2012-09-24 | Discharge: 2012-09-24 | Disposition: A | Discharge status: Home / Self Care | Payer: BC Managed Care – PPO | Source: Ambulatory Visit | Attending: Obstetrics and Gynecology | Admitting: Obstetrics and Gynecology

## 2012-09-24 DIAGNOSIS — N6320 Unspecified lump in the left breast, unspecified quadrant: Secondary | ICD-10-CM

## 2012-11-02 ENCOUNTER — Other Ambulatory Visit: Payer: Self-pay

## 2012-11-28 ENCOUNTER — Other Ambulatory Visit: Payer: Self-pay | Admitting: Family Medicine

## 2012-12-16 ENCOUNTER — Ambulatory Visit (INDEPENDENT_AMBULATORY_CARE_PROVIDER_SITE_OTHER): Payer: BC Managed Care – PPO | Admitting: Family Medicine

## 2012-12-16 ENCOUNTER — Encounter: Payer: Self-pay | Admitting: Family Medicine

## 2012-12-16 VITALS — BP 120/74 | HR 84 | Temp 99.1°F | Wt 257.0 lb

## 2012-12-16 DIAGNOSIS — J019 Acute sinusitis, unspecified: Secondary | ICD-10-CM

## 2012-12-16 DIAGNOSIS — M549 Dorsalgia, unspecified: Secondary | ICD-10-CM

## 2012-12-16 MED ORDER — CEFUROXIME AXETIL 500 MG PO TABS: 500.0000 mg | ORAL_TABLET | Freq: Two times a day (BID) | ORAL | Status: AC

## 2012-12-16 MED ORDER — CYCLOBENZAPRINE HCL 10 MG PO TABS: 10.0000 mg | ORAL_TABLET | Freq: Three times a day (TID) | ORAL | Status: DC | PRN

## 2012-12-16 MED ORDER — TRAMADOL HCL 50 MG PO TABS: 50.0000 mg | ORAL_TABLET | Freq: Three times a day (TID) | ORAL | Status: DC | PRN

## 2012-12-16 NOTE — Progress Notes (Signed)
  Subjective:     [name] is a 32 y.o. female who presents for evaluation of sinus pain. Symptoms include: congestion, facial pain, fevers, nasal congestion and sinus pressure. Onset of symptoms was 2 weeks ago. Symptoms have been gradually worsening since that time. Past history is significant for no history of pneumonia or bronchitis. Patient is a non-smoker.  Pt also hurt her back while shoveling snow a few weeks ago.  She thought it was improving but then pain worsened again a few days agao.  No radiation of pain.    The following portions of the patient's history were reviewed and updated as appropriate: allergies, current medications, past family history, past medical history, past social history, past surgical history and problem list.  Review of Systems Pertinent items are noted in HPI.   Objective:    BP 120/74  Pulse 84  Temp(Src) 99.1 F (37.3 C) (Oral)  Wt 257 lb (116.574 kg)  BMI 40.24 kg/m2  SpO2 99% General appearance: alert and cooperative Ears: normal TM's and external ear canals both ears Nose: green discharge, moderate congestion, turbinates red, swollen, sinus tenderness bilateral Throat: lips, mucosa, and tongue normal; teeth and gums normal Neck: moderate anterior cervical adenopathy, supple, symmetrical, trachea midline and thyroid not enlarged, symmetric, no tenderness/mass/nodules Back: symmetric, no curvature. ROM normal. No CVA tenderness. Lungs: clear to auscultation bilaterally Extremities: extremities normal, atraumatic, no cyanosis or edema Neurologic: Alert and oriented X 3, normal strength and tone. Normal symmetric reflexes. Normal coordination and gait    Assessment:    Acute bacterial sinusitis Back pain with no radiation-- flexeril and ultram,  Rest , warm compresses                                                 rto prn.    Plan:    Nasal steroids per medication orders. Antihistamines per medication orders. Ceftin per medication  orders.

## 2012-12-16 NOTE — Patient Instructions (Signed)

## 2013-02-26 ENCOUNTER — Other Ambulatory Visit: Payer: Self-pay | Admitting: General Practice

## 2013-02-26 MED ORDER — FLUOXETINE HCL 20 MG PO CAPS: ORAL_CAPSULE | ORAL | Status: DC

## 2013-04-16 ENCOUNTER — Encounter: Payer: Self-pay | Admitting: Family Medicine

## 2013-04-16 ENCOUNTER — Other Ambulatory Visit: Payer: Self-pay | Admitting: Family Medicine

## 2013-04-16 DIAGNOSIS — F419 Anxiety disorder, unspecified: Secondary | ICD-10-CM

## 2013-04-16 DIAGNOSIS — F32A Depression, unspecified: Secondary | ICD-10-CM

## 2013-04-16 DIAGNOSIS — F329 Major depressive disorder, single episode, unspecified: Secondary | ICD-10-CM

## 2013-04-17 NOTE — Telephone Encounter (Signed)
Please advise 

## 2013-04-21 MED ORDER — ALPRAZOLAM 0.25 MG PO TABS: 0.2500 mg | ORAL_TABLET | Freq: Three times a day (TID) | ORAL | Status: DC | PRN

## 2013-04-21 NOTE — Telephone Encounter (Signed)
Last seen 12/16/12 and filled 07/19/12 #30. Please advise      KP

## 2013-05-12 ENCOUNTER — Encounter: Payer: Self-pay | Admitting: Lab

## 2013-05-13 ENCOUNTER — Ambulatory Visit (INDEPENDENT_AMBULATORY_CARE_PROVIDER_SITE_OTHER): Payer: BC Managed Care – PPO | Admitting: Family Medicine

## 2013-05-13 ENCOUNTER — Encounter: Payer: Self-pay | Admitting: Family Medicine

## 2013-05-13 DIAGNOSIS — E669 Obesity, unspecified: Secondary | ICD-10-CM

## 2013-05-13 MED ORDER — PHENTERMINE HCL 37.5 MG PO TABS: 37.5000 mg | ORAL_TABLET | Freq: Every day | ORAL | Status: DC

## 2013-05-13 NOTE — Assessment & Plan Note (Signed)
adipex for 1 month rto 1 month Discussed diet and exercise

## 2013-05-13 NOTE — Patient Instructions (Signed)
Obesity Obesity is defined as having too much total body fat and a body mass index (BMI) of 30 or more. BMI is an estimate of body fat and is calculated from your height and weight. Obesity happens when you consume more calories than you can burn by exercising or performing daily physical tasks. Prolonged obesity can cause major illnesses or emergencies, such as:   A stroke.  Heart disease.  Diabetes.  Cancer.  Arthritis.  High blood pressure (hypertension).  High cholesterol.  Sleep apnea.  Erectile dysfunction.  Infertility problems. CAUSES   Regularly eating unhealthy foods.  Physical inactivity.  Certain disorders, such as an underactive thyroid (hypothyroidism), Cushing's syndrome, and polycystic ovarian syndrome.  Certain medicines, such as steroids, some depression medicines, and antipsychotics.  Genetics.  Lack of sleep. DIAGNOSIS  A caregiver can diagnose obesity after calculating your BMI. Obesity will be diagnosed if your BMI is 30 or higher.  There are other methods of measuring obesity levels. Some other methods include measuring your skin fold thickness, your waist circumference, and comparing your hip circumference to your waist circumference. TREATMENT  A healthy treatment program includes some or all of the following:  Long-term dietary changes.  Exercise and physical activity.  Behavioral and lifestyle changes.  Medicine only under the supervision of your caregiver. Medicines may help, but only if they are used with diet and exercise programs. An unhealthy treatment program includes:  Fasting.  Fad diets.  Supplements and drugs. These choices do not succeed in long-term weight control.  HOME CARE INSTRUCTIONS   Exercise and perform physical activity as directed by your caregiver. To increase physical activity, try the following:  Use stairs instead of elevators.  Park farther away from store entrances.  Garden, bike, or walk instead of  watching television or using the computer.  Eat healthy, low-calorie foods and drinks on a regular basis. Eat more fruits and vegetables. Use low-calorie cookbooks or take healthy cooking classes.  Limit fast food, sweets, and processed snack foods.  Eat smaller portions.  Keep a daily journal of everything you eat. There are many free websites to help you with this. It may be helpful to measure your foods so you can determine if you are eating the correct portion sizes.  Avoid drinking alcohol. Drink more water and drinks without calories.  Take vitamins and supplements only as recommended by your caregiver.  Weight-loss support groups, Registered Dieticians, counselors, and stress reduction education can also be very helpful. SEEK IMMEDIATE MEDICAL CARE IF:  You have chest pain or tightness.  You have trouble breathing or feel short of breath.  You have weakness or leg numbness.  You feel confused or have trouble talking.  You have sudden changes in your vision. MAKE SURE YOU:  Understand these instructions.  Will watch your condition.  Will get help right away if you are not doing well or get worse. Document Released: 10/12/2004 Document Revised: 03/05/2012 Document Reviewed: 10/11/2011 ExitCare Patient Information 2014 [employer].  

## 2013-05-13 NOTE — Progress Notes (Signed)
  Subjective:    Patient ID: [name], female    DOB: 1981/05/30, 32 y.o.   MRN: [id number]  HPI Pt here to discuss weight loss. Pt has been on adipex before and was successful but then gained it all back.  No other complaints She is exercising but not following any specific diet.  No other complaints  2   Review of Systems As above    Objective:   Physical Exam  BP 118/80  Pulse 72  Temp(Src) 98.6 F (37 C) (Oral)  Wt 261 lb 9.6 oz (118.661 kg)  BMI 40.96 kg/m2  SpO2 99% General appearance: alert, cooperative, appears stated age and no distress Lungs: clear to auscultation bilaterally Heart: S1, S2 normal Abdomen: soft, non-tender; bowel sounds normal; no masses,  no organomegaly Extremities: extremities normal, atraumatic, no cyanosis or edema      Assessment & Plan:

## 2013-05-28 ENCOUNTER — Other Ambulatory Visit: Payer: Self-pay | Admitting: Family Medicine

## 2013-07-17 ENCOUNTER — Other Ambulatory Visit: Payer: Self-pay | Admitting: Family Medicine

## 2013-07-24 ENCOUNTER — Other Ambulatory Visit: Payer: Self-pay

## 2013-08-28 ENCOUNTER — Encounter: Payer: Self-pay | Admitting: Family Medicine

## 2013-08-28 ENCOUNTER — Ambulatory Visit (INDEPENDENT_AMBULATORY_CARE_PROVIDER_SITE_OTHER): Payer: BC Managed Care – PPO | Admitting: Family Medicine

## 2013-08-28 VITALS — BP 139/95 | HR 70 | Temp 98.8°F | Wt 259.0 lb

## 2013-08-28 DIAGNOSIS — N911 Secondary amenorrhea: Secondary | ICD-10-CM | POA: Insufficient documentation

## 2013-08-28 DIAGNOSIS — N912 Amenorrhea, unspecified: Secondary | ICD-10-CM

## 2013-08-28 LAB — BASIC METABOLIC PANEL
BUN: 8 mg/dL (ref 6–23)
CO2: 26 mEq/L (ref 19–32)
Calcium: 8.8 mg/dL (ref 8.4–10.5)
Chloride: 104 mEq/L (ref 96–112)
Creatinine, Ser: 0.8 mg/dL (ref 0.4–1.2)
GFR: 103.68 mL/min (ref >60.00)
Glucose, Bld: 89 mg/dL (ref 70–99)
Potassium: 3.7 mEq/L (ref 3.5–5.1)
Sodium: 137 mEq/L (ref 135–145)

## 2013-08-28 LAB — CBC WITH DIFFERENTIAL/PLATELET
Basophils Absolute: 0.1 10*3/uL (ref 0.0–0.1)
Basophils Relative: 0.5 % (ref 0.0–3.0)
Eosinophils Absolute: 0.3 10*3/uL (ref 0.0–0.7)
Eosinophils Relative: 2.2 % (ref 0.0–5.0)
HCT: 36.3 % (ref 36.0–46.0)
Hemoglobin: 11.4 g/dL — ABNORMAL LOW (ref 12.0–15.0)
Lymphocytes Relative: 28.2 % (ref 12.0–46.0)
Lymphs Abs: 3.8 10*3/uL (ref 0.7–4.0)
MCHC: 31.5 g/dL (ref 30.0–36.0)
MCV: 70.7 fl — ABNORMAL LOW (ref 78.0–100.0)
Monocytes Absolute: 0.8 10*3/uL (ref 0.1–1.0)
Monocytes Relative: 6.1 % (ref 3.0–12.0)
Neutro Abs: 8.4 10*3/uL — ABNORMAL HIGH (ref 1.4–7.7)
Neutrophils Relative %: 63 % (ref 43.0–77.0)
Platelets: 296 10*3/uL (ref 150.0–400.0)
RBC: 5.13 Mil/uL — ABNORMAL HIGH (ref 3.87–5.11)
RDW: 16.1 % — ABNORMAL HIGH (ref 11.5–14.6)
WBC: 13.3 10*3/uL — ABNORMAL HIGH (ref 4.5–10.5)

## 2013-08-28 LAB — POCT URINALYSIS DIPSTICK
Bilirubin, UA: NEGATIVE
Glucose, UA: NEGATIVE
Ketones, UA: NEGATIVE
Leukocytes, UA: NEGATIVE
Nitrite, UA: NEGATIVE
Protein, UA: NEGATIVE
Spec Grav, UA: 1.01
Urobilinogen, UA: 0.2
pH, UA: 6.5

## 2013-08-28 LAB — HCG, QUANTITATIVE, PREGNANCY: hCG, Beta Chain, Quant, S: 0.19 m[IU]/mL

## 2013-08-28 LAB — POCT URINE PREGNANCY: Preg Test, Ur: NEGATIVE

## 2013-08-28 LAB — TSH: TSH: 2.25 u[IU]/mL (ref 0.35–5.50)

## 2013-08-28 NOTE — Progress Notes (Signed)
   Subjective:    Patient ID: [name], female    DOB: [birth year], 32 y.o.   MRN: [id number]  HPI Pt is here c/o being late with period=== almost 2 weeks.  No other symptos   Review of Systems As above    Objective:   Physical Exam BP 139/95  Pulse 70  Temp(Src) 98.8 F (37.1 C) (Oral)  Wt 259 lb (117.482 kg)  SpO2 99%  LMP 07/11/2013 General appearance: alert, cooperative, appears stated age and no distress Heart: regular rate and rhythm, S1, S2 normal, no murmur, click, rub or gallop Abdomen: soft, non-tender; bowel sounds normal; no masses,  no organomegaly Extremities: extremities normal, atraumatic, no cyanosis or edema       Assessment & Plan:

## 2013-08-28 NOTE — Assessment & Plan Note (Signed)
US pelvis Check labs

## 2013-08-28 NOTE — Progress Notes (Signed)
Pre visit review using our clinic review tool, if applicable. No additional management support is needed unless otherwise documented below in the visit note. 

## 2013-08-28 NOTE — Patient Instructions (Signed)
Secondary Amenorrhea   Secondary amenorrhea is the stopping of menstrual flow for 3 6 months in a female who has previously had periods. There are many possible causes. Most of these causes are not serious. Usually, treating the underlying problem causing the loss of menses will return your periods to normal.  CAUSES   Some common and uncommon causes of not menstruating include:   Malnutrition.   Low blood sugar (hypoglycemia).   Polycystic ovary disease.   Stress or fear.   Breastfeeding.   Hormone imbalance.   Ovarian failure.   Medicines.   Extreme obesity.   Cystic fibrosis.   Low body weight or drastic weight reduction from any cause.   Early menopause.   Removal of ovaries or uterus.   Contraceptives.   Illness.   Long-term (chronic) illnesses.   Cushing syndrome.   Thyroid problems.   Birth control pills, patches, or vaginal rings for birth control.  RISK FACTORS  You may be at greater risk of secondary amenorrhea if:   You have a family history of this condition.   You have an eating disorder.   You do athletic training.  DIAGNOSIS   A diagnosis is made by your health care provider taking a medical history and doing a physical exam. This will include a pelvic exam to check for problems with your reproductive organs. Pregnancy must be ruled out. Often, numerous blood tests are done to measure different hormones in the body. Urine testing may be done. Specialized exams (ultrasound, CT scan, MRI, or hysteroscopy) may have to be done as well as measuring the body mass index (BMI).  TREATMENT   Treatment depends on the cause of the amenorrhea. If an eating disorder is present, this can be treated with an adequate diet and therapy. Chronic illnesses may improve with treatment of the illness. Amenorrhea may be corrected with medicines, lifestyle changes, or surgery. If the amenorrhea cannot be corrected, it is sometimes possible to create a false menstruation with medicines.  HOME CARE  INSTRUCTIONS   Maintain a healthy diet.   Manage weight problems.   Exercise regularly but not excessively.   Get adequate sleep.   Manage stress.   Be aware of changes in your menstrual cycle. Keep a record of when your periods occur. Note the date your period starts, how long it lasts, and any problems.  SEEK MEDICAL CARE IF:  Your symptoms do not get better with treatment.  Document Released: 10/16/2006 Document Revised: 05/07/2013 Document Reviewed: 02/20/2013  Exit[employer] Patient Information 2014 [employer].

## 2013-09-02 ENCOUNTER — Ambulatory Visit
Admission: RE | Admit: 2013-09-02 | Discharge: 2013-09-02 | Disposition: A | Discharge status: Home / Self Care | Payer: BC Managed Care – PPO | Source: Ambulatory Visit | Attending: Family Medicine | Admitting: Family Medicine

## 2013-09-02 ENCOUNTER — Other Ambulatory Visit: Payer: Self-pay | Admitting: Family Medicine

## 2013-09-02 DIAGNOSIS — N912 Amenorrhea, unspecified: Secondary | ICD-10-CM

## 2013-09-02 DIAGNOSIS — N83299 Other ovarian cyst, unspecified side: Secondary | ICD-10-CM

## 2013-09-03 ENCOUNTER — Other Ambulatory Visit: Payer: Self-pay

## 2013-09-03 DIAGNOSIS — N83209 Unspecified ovarian cyst, unspecified side: Secondary | ICD-10-CM

## 2013-09-12 ENCOUNTER — Telehealth: Payer: Self-pay | Admitting: *Deleted

## 2013-09-12 MED ORDER — OSELTAMIVIR PHOSPHATE 75 MG PO CAPS: 75.0000 mg | ORAL_CAPSULE | Freq: Every day | ORAL | Status: DC

## 2013-09-12 NOTE — Telephone Encounter (Signed)
Med filled.  

## 2013-09-12 NOTE — Telephone Encounter (Signed)
Patient called and stated that she has been around her aunt who was just diagnosed the flu and pneumonia. Patient would like to know if something can be called into the pharmacy for her she has been exposed. Please advise. SW

## 2013-09-12 NOTE — Telephone Encounter (Signed)
Can have Tamiflu 75mg  daily x10 days for flu exposure

## 2013-10-09 ENCOUNTER — Encounter: Payer: Self-pay | Admitting: Family Medicine

## 2013-10-09 MED ORDER — ALBUTEROL SULFATE HFA 108 (90 BASE) MCG/ACT IN AERS: 2.0000 | INHALATION_SPRAY | Freq: Four times a day (QID) | RESPIRATORY_TRACT | Status: DC | PRN

## 2014-01-19 ENCOUNTER — Encounter: Payer: Self-pay | Admitting: Family Medicine

## 2014-01-19 ENCOUNTER — Other Ambulatory Visit: Payer: Self-pay | Admitting: Family Medicine

## 2014-01-19 DIAGNOSIS — F419 Anxiety disorder, unspecified: Secondary | ICD-10-CM

## 2014-01-19 DIAGNOSIS — F32A Depression, unspecified: Secondary | ICD-10-CM

## 2014-01-19 DIAGNOSIS — F329 Major depressive disorder, single episode, unspecified: Secondary | ICD-10-CM

## 2014-01-19 MED ORDER — ALPRAZOLAM 0.25 MG PO TABS: 0.2500 mg | ORAL_TABLET | Freq: Three times a day (TID) | ORAL | Status: DC | PRN

## 2014-01-19 NOTE — Telephone Encounter (Signed)
Last seen 08/28/13 and filled 04/21/13 #30. Please advise      KP

## 2014-01-20 ENCOUNTER — Ambulatory Visit: Payer: BC Managed Care – PPO | Admitting: Family Medicine

## 2014-01-20 DIAGNOSIS — Z0289 Encounter for other administrative examinations: Secondary | ICD-10-CM

## 2014-01-22 ENCOUNTER — Ambulatory Visit: Payer: BC Managed Care – PPO | Admitting: Family Medicine

## 2014-04-17 ENCOUNTER — Telehealth: Payer: Self-pay | Admitting: Family Medicine

## 2014-04-17 NOTE — Telephone Encounter (Signed)
Caller name: Angeleen Relation to pt: Call back number:[phone] Pharmacy:  Reason for call: pt was charged $50 for 01/20/14 appointment. She states she did not know about the appointment and would like the $50 reversed. Please call pt and advise.

## 2014-04-17 NOTE — Telephone Encounter (Signed)
Disregard. Sending staff message to [facility]

## 2014-07-03 ENCOUNTER — Other Ambulatory Visit: Payer: Self-pay

## 2014-07-12 ENCOUNTER — Other Ambulatory Visit: Payer: Self-pay | Admitting: Family Medicine

## 2014-07-15 ENCOUNTER — Other Ambulatory Visit: Payer: Self-pay | Admitting: Family Medicine

## 2014-07-15 DIAGNOSIS — F419 Anxiety disorder, unspecified: Secondary | ICD-10-CM

## 2014-07-15 DIAGNOSIS — F329 Major depressive disorder, single episode, unspecified: Secondary | ICD-10-CM

## 2014-07-15 DIAGNOSIS — M549 Dorsalgia, unspecified: Secondary | ICD-10-CM

## 2014-07-15 DIAGNOSIS — F32A Depression, unspecified: Secondary | ICD-10-CM

## 2014-07-16 MED ORDER — CYCLOBENZAPRINE HCL 10 MG PO TABS
10.0000 mg | ORAL_TABLET | Freq: Three times a day (TID) | ORAL | Status: DC | PRN
Start: 2014-07-16 — End: 2014-10-30

## 2014-07-16 MED ORDER — TRAMADOL HCL 50 MG PO TABS: 50.0000 mg | ORAL_TABLET | Freq: Three times a day (TID) | ORAL | Status: DC | PRN

## 2014-07-16 MED ORDER — ALPRAZOLAM 0.25 MG PO TABS: 0.2500 mg | ORAL_TABLET | Freq: Three times a day (TID) | ORAL | Status: DC | PRN

## 2014-07-16 MED ORDER — ALBUTEROL SULFATE HFA 108 (90 BASE) MCG/ACT IN AERS: 2.0000 | INHALATION_SPRAY | Freq: Four times a day (QID) | RESPIRATORY_TRACT | Status: DC | PRN

## 2014-07-16 MED ORDER — FLUOXETINE HCL 20 MG PO CAPS: ORAL_CAPSULE | ORAL | Status: DC

## 2014-07-16 NOTE — Telephone Encounter (Signed)
Last seen 08/28/13  Tramadol filled 12/16/12 #30 Xanax 01/19/14 #30  No UDS   Please adivse      KP

## 2014-08-10 ENCOUNTER — Other Ambulatory Visit: Payer: Self-pay | Admitting: Family Medicine

## 2014-08-11 ENCOUNTER — Other Ambulatory Visit: Payer: Self-pay

## 2014-08-11 MED ORDER — VALACYCLOVIR HCL 1 G PO TABS: 1000.0000 mg | ORAL_TABLET | Freq: Every day | ORAL | Status: DC

## 2014-09-02 ENCOUNTER — Encounter: Payer: Self-pay | Admitting: Family Medicine

## 2014-09-03 ENCOUNTER — Ambulatory Visit (INDEPENDENT_AMBULATORY_CARE_PROVIDER_SITE_OTHER): Payer: BC Managed Care – PPO | Admitting: Medical

## 2014-09-03 ENCOUNTER — Encounter: Payer: Self-pay | Admitting: Medical

## 2014-09-03 VITALS — BP 121/85 | HR 96 | Temp 98.0°F | Ht 67.0 in | Wt 270.4 lb

## 2014-09-03 DIAGNOSIS — N390 Urinary tract infection, site not specified: Secondary | ICD-10-CM

## 2014-09-03 DIAGNOSIS — R82998 Other abnormal findings in urine: Secondary | ICD-10-CM

## 2014-09-03 DIAGNOSIS — R350 Frequency of micturition: Secondary | ICD-10-CM

## 2014-09-03 LAB — POCT URINALYSIS DIPSTICK
Bilirubin, UA: NEGATIVE
Glucose, UA: NEGATIVE
Ketones, UA: NEGATIVE
Nitrite, UA: NEGATIVE
Spec Grav, UA: 1.01
Urobilinogen, UA: 0.2
pH, UA: 6.5

## 2014-09-03 MED ORDER — NITROFURANTOIN MONOHYD MACRO 100 MG PO CAPS: 100.0000 mg | ORAL_CAPSULE | Freq: Two times a day (BID) | ORAL | Status: DC

## 2014-09-03 MED ORDER — PHENAZOPYRIDINE HCL 200 MG PO TABS: 200.0000 mg | ORAL_TABLET | Freq: Three times a day (TID) | ORAL | Status: DC | PRN

## 2014-09-03 MED ORDER — FLUCONAZOLE 150 MG PO TABS: 150.0000 mg | ORAL_TABLET | Freq: Once | ORAL | Status: DC

## 2014-09-03 NOTE — Patient Instructions (Signed)
Your appear to have a urinary tract infection. I am prescribing  macrobid antibiotic for the probable infection. And pyridium for urinary pain.  Hydrate well. I am sending out a urine culture. During the interim if your signs and symptoms worsen rather than improving please notify us. We will notify your when the culture results are back.  Rx diflucan for yeast infection if occurs.  Follow up in 7 days or as needed.

## 2014-09-03 NOTE — Progress Notes (Signed)
Pre visit review using our clinic review tool, if applicable. No additional management support is needed unless otherwise documented below in the visit note. 

## 2014-09-03 NOTE — Assessment & Plan Note (Signed)
Your appear to have a urinary tract infection. I am prescribing  macrobid antibiotic for the probable infection. And pyridium for urinary pain.  Hydrate well. I am sending out a urine culture. During the interim if your signs and symptoms worsen rather than improving please notify us. We will notify your when the culture results are back.

## 2014-09-03 NOTE — Progress Notes (Signed)
Subjective:    Patient ID: [name], female    DOB: 24-Jan-1981, 33 y.o.   MRN: [id number]  HPI   Pt in today reporting urinary symptoms. X 5 days. Trying to get rid with cranberry juice. Hx of uti about 5 times a year. No hx of preventative meds. Never seen by urologist.   Dysuria- yes Frequent urination-yes Hesitancy-no Suprapubic pressure-yes Fever-no chills-no Nausea-no Vomiting-no CVA pain-no History of UTI-yes  LMP- Decemeber 5th. Gross hematuria-none.  Past Medical History  Diagnosis Date  . Anemia   . Thyroid disease   . Asthma   . Obesity   . Genital herpes     History   Social History  . Marital Status: Single    Spouse Name: N/A    Number of Children: N/A  . Years of Education: N/A   Occupational History  . [employer]-- teach biology    Social History Main Topics  . Smoking status: Never Smoker   . Smokeless tobacco: Never Used  . Alcohol Use: Yes  . Drug Use: No  . Sexual Activity:    Partners: Male   Other Topics Concern  . Not on file   Social History Narrative    Past Surgical History  Procedure Laterality Date  . Keloid excision  2008    removed @[facility] ----- 10/2011  . Salivary gland surgery      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    Family History  Problem Relation Age of Onset  . Coronary artery disease    . Prostate cancer    . Hyperlipidemia Mother   . Hypertension Mother   . Depression Father   . Hyperlipidemia Father   . Heart disease Father [id number]    MI  . Hypertension Father   . Cancer Father [id number]    prostate  . Cancer Maternal Grandfather     prostate  . Cancer Paternal Grandfather     prostate    No Known Allergies  Current Outpatient Prescriptions on File Prior to Visit  Medication Sig Dispense Refill  . albuterol (PROAIR HFA) 108 (90 BASE) MCG/ACT inhaler Inhale 2 puffs into the lungs every 6 (six) hours as needed. 6.7 g 1  . ALPRAZolam (XANAX) 0.25 MG tablet Take 1 tablet (0.25 mg total) by mouth 3  (three) times daily as needed for sleep. 30 tablet 0  . cyclobenzaprine (FLEXERIL) 10 MG tablet Take 1 tablet (10 mg total) by mouth 3 (three) times daily as needed for muscle spasms. 30 tablet 0  . FLUoxetine (PROZAC) 20 MG capsule Take 1 capsule (20 mg total) by mouth daily. Office visit due now for more refills. 30 capsule 0  . oseltamivir (TAMIFLU) 75 MG capsule Take 1 capsule (75 mg total) by mouth daily. 10 capsule 0  . phentermine (ADIPEX-P) 37.5 MG tablet Take 1 tablet (37.5 mg total) by mouth daily before breakfast. 30 tablet 0  . traMADol (ULTRAM) 50 MG tablet Take 1 tablet (50 mg total) by mouth every 8 (eight) hours as needed. 30 tablet 0  . valACYclovir (VALTREX) 1000 MG tablet Take 1 tablet (1,000 mg total) by mouth daily. 90 tablet 3   No current facility-administered medications on file prior to visit.    BP 121/85 mmHg  Pulse 96  Temp(Src) 98 F (36.7 C) (Oral)  Ht 5\' 7"  (1.702 m)  Wt 270 lb 6.4 oz (122.653 kg)  BMI 42.34 kg/m2  SpO2 99%  LMP 08/19/2014      Review of Systems  Constitutional: Negative for fever, chills and fatigue.  Respiratory: Negative for cough, chest tightness, shortness of breath and wheezing.   Cardiovascular: Negative for chest pain and palpitations.  Gastrointestinal: Negative.   Genitourinary: Positive for dysuria, urgency and frequency. Negative for hematuria, flank pain and difficulty urinating.  Musculoskeletal:       No cva pain. Points to both si areas.  Neurological: Negative for headaches.  Hematological: Negative for adenopathy. Does not bruise/bleed easily.       Objective:   Physical Exam   General  Mental Status- Alert. Orientation- Orientation x 4.   Skin General:- Normal. Moisture- Dry. Temperature- Warm.  HEENT Head- normal.  Neck Neck- Supple.  Heart Ausculation-RRR  Lungs Ausculation- Clear, even, unlabored bilaterlly.    Abdomen Palpation/Percussion: Palpation and Percussion of the abdomen  reveal- mild suprapubic Tender, No Rebound tenderness, No Rigidity(guarding), No Palpable abdominal masses and No jar tenderness. No suprapubic tenderness. Liver:-Normal. Spleen:- Normal. Other Characteristics- No Costovertebral angle tenderness- Left or Costovertebral angle tenderness- Right.  Auscultation: Auscultation of the abdomen reveals- Bowel Sounds normal.        Assessment & Plan:

## 2014-09-06 LAB — URINE CULTURE

## 2014-09-20 ENCOUNTER — Other Ambulatory Visit: Payer: Self-pay | Admitting: Family Medicine

## 2014-10-23 ENCOUNTER — Other Ambulatory Visit: Payer: Self-pay | Admitting: Family Medicine

## 2014-10-29 ENCOUNTER — Telehealth: Payer: Self-pay | Admitting: *Deleted

## 2014-10-29 NOTE — Telephone Encounter (Signed)
Pre-Visit Call:   Pap- 07/19/12 with Dr. Laury Axon, normal  Mmg- 09/24/12 for lump patient felt in breast, Cain Saupe, MD at [facility]- BI-RADS CATEGORY 2: benign (US revealed benign cyst)   Flu-  Td- 11/10/10    Concerns: Patient not available at time of call, message left on voicemail to return call.

## 2014-10-30 ENCOUNTER — Ambulatory Visit (INDEPENDENT_AMBULATORY_CARE_PROVIDER_SITE_OTHER): Payer: BLUE CROSS/BLUE SHIELD | Admitting: Family Medicine

## 2014-10-30 ENCOUNTER — Encounter: Payer: Self-pay | Admitting: Family Medicine

## 2014-10-30 VITALS — BP 138/82 | HR 85 | Temp 97.6°F | Ht 67.0 in | Wt 267.6 lb

## 2014-10-30 DIAGNOSIS — F419 Anxiety disorder, unspecified: Secondary | ICD-10-CM

## 2014-10-30 DIAGNOSIS — Z0000 Encounter for general adult medical examination without abnormal findings: Secondary | ICD-10-CM

## 2014-10-30 DIAGNOSIS — J4521 Mild intermittent asthma with (acute) exacerbation: Secondary | ICD-10-CM

## 2014-10-30 DIAGNOSIS — F32A Depression, unspecified: Secondary | ICD-10-CM

## 2014-10-30 DIAGNOSIS — Z202 Contact with and (suspected) exposure to infections with a predominantly sexual mode of transmission: Secondary | ICD-10-CM

## 2014-10-30 DIAGNOSIS — R748 Abnormal levels of other serum enzymes: Secondary | ICD-10-CM

## 2014-10-30 DIAGNOSIS — F329 Major depressive disorder, single episode, unspecified: Secondary | ICD-10-CM

## 2014-10-30 DIAGNOSIS — M549 Dorsalgia, unspecified: Secondary | ICD-10-CM

## 2014-10-30 DIAGNOSIS — A6000 Herpesviral infection of urogenital system, unspecified: Secondary | ICD-10-CM

## 2014-10-30 DIAGNOSIS — R5383 Other fatigue: Secondary | ICD-10-CM

## 2014-10-30 LAB — CBC WITH DIFFERENTIAL/PLATELET
Basophils Absolute: 0 10*3/uL (ref 0.0–0.1)
Basophils Relative: 0.3 % (ref 0.0–3.0)
Eosinophils Absolute: 0.1 10*3/uL (ref 0.0–0.7)
Eosinophils Relative: 1.1 % (ref 0.0–5.0)
HCT: 38.4 % (ref 36.0–46.0)
Hemoglobin: 12.2 g/dL (ref 12.0–15.0)
Lymphocytes Relative: 21.3 % (ref 12.0–46.0)
Lymphs Abs: 2.2 10*3/uL (ref 0.7–4.0)
MCHC: 31.8 g/dL (ref 30.0–36.0)
MCV: 71.9 fl — ABNORMAL LOW (ref 78.0–100.0)
Monocytes Absolute: 0.8 10*3/uL (ref 0.1–1.0)
Monocytes Relative: 7.6 % (ref 3.0–12.0)
Neutro Abs: 7.3 10*3/uL (ref 1.4–7.7)
Neutrophils Relative %: 69.7 % (ref 43.0–77.0)
Platelets: 328 10*3/uL (ref 150.0–400.0)
RBC: 5.35 Mil/uL — ABNORMAL HIGH (ref 3.87–5.11)
RDW: 16.4 % — ABNORMAL HIGH (ref 11.5–15.5)
WBC: 10.5 10*3/uL (ref 4.0–10.5)

## 2014-10-30 LAB — BASIC METABOLIC PANEL
BUN: 4 mg/dL — ABNORMAL LOW (ref 6–23)
CO2: 30 mEq/L (ref 19–32)
Calcium: 9 mg/dL (ref 8.4–10.5)
Chloride: 103 mEq/L (ref 96–112)
Creatinine, Ser: 0.73 mg/dL (ref 0.40–1.20)
GFR: 117.71 mL/min (ref >60.00)
Glucose, Bld: 89 mg/dL (ref 70–99)
Potassium: 3.4 mEq/L — ABNORMAL LOW (ref 3.5–5.1)
Sodium: 137 mEq/L (ref 135–145)

## 2014-10-30 LAB — LIPID PANEL
Cholesterol: 112 mg/dL (ref 0–200)
HDL: 35.1 mg/dL — ABNORMAL LOW (ref >39.00)
LDL Cholesterol: 65 mg/dL (ref 0–99)
NonHDL: 76.9
Total CHOL/HDL Ratio: 3
Triglycerides: 61 mg/dL (ref 0.0–149.0)
VLDL: 12.2 mg/dL (ref 0.0–40.0)

## 2014-10-30 LAB — VITAMIN B12: Vitamin B-12: 208 pg/mL — ABNORMAL LOW (ref 211–911)

## 2014-10-30 LAB — T4, FREE: Free T4: 0.76 ng/dL (ref 0.60–1.60)

## 2014-10-30 LAB — RPR

## 2014-10-30 LAB — TSH: TSH: 2.69 u[IU]/mL (ref 0.35–4.50)

## 2014-10-30 LAB — HEPATIC FUNCTION PANEL
ALT: 19 U/L (ref 0–35)
AST: 22 U/L (ref 0–37)
Albumin: 3.7 g/dL (ref 3.5–5.2)
Alkaline Phosphatase: 141 U/L — ABNORMAL HIGH (ref 39–117)
Bilirubin, Direct: 0.2 mg/dL (ref 0.0–0.3)
Total Bilirubin: 0.4 mg/dL (ref 0.2–1.2)
Total Protein: 8 g/dL (ref 6.0–8.3)

## 2014-10-30 LAB — T3, FREE: T3, Free: 2.6 pg/mL (ref 2.3–4.2)

## 2014-10-30 LAB — HIV ANTIBODY (ROUTINE TESTING W REFLEX): HIV 1&2 Ab, 4th Generation: NONREACTIVE

## 2014-10-30 MED ORDER — ALBUTEROL SULFATE HFA 108 (90 BASE) MCG/ACT IN AERS: 2.0000 | INHALATION_SPRAY | Freq: Four times a day (QID) | RESPIRATORY_TRACT | Status: DC | PRN

## 2014-10-30 MED ORDER — VALACYCLOVIR HCL 1 G PO TABS: 1000.0000 mg | ORAL_TABLET | Freq: Every day | ORAL | Status: DC

## 2014-10-30 MED ORDER — CYCLOBENZAPRINE HCL 10 MG PO TABS: 10.0000 mg | ORAL_TABLET | Freq: Three times a day (TID) | ORAL | Status: DC | PRN

## 2014-10-30 MED ORDER — TRAMADOL HCL 50 MG PO TABS: 50.0000 mg | ORAL_TABLET | Freq: Three times a day (TID) | ORAL | Status: DC | PRN

## 2014-10-30 MED ORDER — ALPRAZOLAM 0.25 MG PO TABS: 0.2500 mg | ORAL_TABLET | Freq: Three times a day (TID) | ORAL | Status: DC | PRN

## 2014-10-30 NOTE — Progress Notes (Signed)
Subjective:     [name] is a 34 y.o. female and is here for a comprehensive physical exam. The patient reports problems - extreme fatigue for 2-3 months.  no other symptoms.  pt denies feeling depressed or stressed.  things at work have actually gotten better.[name]  History   Social History  . Marital Status: Single    Spouse Name: N/A  . Number of Children: N/A  . Years of Education: N/A   Occupational History  . [employer]-- teach biology    Social History Main Topics  . Smoking status: Never Smoker   . Smokeless tobacco: Never Used  . Alcohol Use: Yes  . Drug Use: No  . Sexual Activity:    Partners: Male   Other Topics Concern  . Not on file   Social History Narrative   Health Maintenance  Topic Date Due  . INFLUENZA VACCINE  10/31/2015 (Originally 04/18/2014)  . PAP SMEAR  07/20/2015  . TETANUS/TDAP  11/10/2020    The following portions of the patient's history were reviewed and updated as appropriate:  She  has a past medical history of Anemia; Thyroid disease; Asthma; Obesity; and Genital herpes. She  does not have any pertinent problems on file. She  has past surgical history that includes Keloid excision (2008) and Salivary gland surgery. Her family history includes Cancer in her maternal grandfather and paternal grandfather; Cancer (age of onset: 14) in her father; Coronary artery disease in an other family member; Depression in her father; Heart disease (age of onset: 36) in her father; Hyperlipidemia in her father and mother; Hypertension in her father and mother; Prostate cancer in an other family member. She  reports that she has never smoked. She has never used smokeless tobacco. She reports that she drinks alcohol. She reports that she does not use illicit drugs. She has a current medication list which includes the following prescription(s): albuterol, alprazolam, cyclobenzaprine, fluoxetine, tramadol, and valacyclovir. Current Outpatient Prescriptions on File  Prior to Visit  Medication Sig Dispense Refill  . albuterol (PROAIR HFA) 108 (90 BASE) MCG/ACT inhaler Inhale 2 puffs into the lungs every 6 (six) hours as needed. 6.7 g 1  . ALPRAZolam (XANAX) 0.25 MG tablet Take 1 tablet (0.25 mg total) by mouth 3 (three) times daily as needed for sleep. 30 tablet 0  . cyclobenzaprine (FLEXERIL) 10 MG tablet Take 1 tablet (10 mg total) by mouth 3 (three) times daily as needed for muscle spasms. 30 tablet 0  . FLUoxetine (PROZAC) 20 MG capsule TAKE 1 CAPSULE (20 MG TOTAL) BY MOUTH DAILY. OFFICE VISIT DUE NOW 30 capsule 0  . traMADol (ULTRAM) 50 MG tablet Take 1 tablet (50 mg total) by mouth every 8 (eight) hours as needed. 30 tablet 0  . valACYclovir (VALTREX) 1000 MG tablet Take 1 tablet (1,000 mg total) by mouth daily. 90 tablet 3   No current facility-administered medications on file prior to visit.   She has No Known Allergies..  Review of Systems Review of Systems  Constitutional: Negative for activity change, appetite change---+ fatigue.  HENT: Negative for hearing loss, congestion, tinnitus and ear discharge.  dentist q22m Eyes: Negative for visual disturbance (see optho q2y -- vision corrected to 20/20 with glasses).  Respiratory: Negative for cough, chest tightness and shortness of breath.   Cardiovascular: Negative for chest pain, palpitations and leg swelling.  Gastrointestinal: Negative for abdominal pain, diarrhea, constipation and abdominal distention.  Genitourinary: Negative for urgency, frequency, decreased urine volume and difficulty urinating.  Musculoskeletal: Negative for back pain, arthralgias and gait problem.  Skin: Negative for color change, pallor and rash.  Neurological: Negative for dizziness, light-headedness, numbness and headaches.  Hematological: Negative for adenopathy. Does not bruise/bleed easily.  Psychiatric/Behavioral: Negative for suicidal ideas, confusion, sleep disturbance, self-injury, dysphoric mood, decreased  concentration and agitation.       Objective:    BP 138/82 mmHg  Pulse 85  Temp(Src) 97.6 F (36.4 C) (Oral)  Ht 5\' 7"  (1.702 m)  Wt 267 lb 9.6 oz (121.383 kg)  BMI 41.90 kg/m2  SpO2 98% General appearance: alert, cooperative, appears stated age and no distress Head: Normocephalic, without obvious abnormality, atraumatic Eyes: conjunctivae/corneas clear. PERRL, EOM's intact. Fundi benign. Ears: normal TM's and external ear canals both ears Nose: Nares normal. Septum midline. Mucosa normal. No drainage or sinus tenderness. Throat: lips, mucosa, and tongue normal; teeth and gums normal Neck: no adenopathy, no carotid bruit, no JVD, supple, symmetrical, trachea midline and thyroid not enlarged, symmetric, no tenderness/mass/nodules Back: symmetric, no curvature. ROM normal. No CVA tenderness. Lungs: clear to auscultation bilaterally Breasts: gyn Heart: regular rate and rhythm, S1, S2 normal, no murmur, click, rub or gallop Abdomen: soft, non-tender; bowel sounds normal; no masses,  no organomegaly Pelvic: deferred ---gyn Extremities: extremities normal, atraumatic, no cyanosis or edema Pulses: 2+ and symmetric Skin: Skin color, texture, turgor normal. No rashes or lesions Lymph nodes: Cervical, supraclavicular, and axillary nodes normal. Neurologic: Alert and oriented X 3, normal strength and tone. Normal symmetric reflexes. Normal coordination and gait Psych-- no depression , no anxiety      Assessment:    Healthy female exam.     Plan:     ghm utd Check labs See After Visit Summary for Counseling Recommendations   1. Preventative health care  - Basic metabolic panel - CBC with Differential/Platelet - Hepatic function panel - Lipid panel - POCT urinalysis dipstick - Vitamin B12  2. Other fatigue Check labs,  Take MVI and b complex - TSH - Vitamin B12 - T3, free - T4, free - Epstein-Barr virus VCA antibody panel

## 2014-10-30 NOTE — Patient Instructions (Signed)
Preventive Care for Adults A healthy lifestyle and preventive care can promote health and wellness. Preventive health guidelines for women include the following key practices.  A routine yearly physical is a good way to check with your health care provider about your health and preventive screening. It is a chance to share any concerns and updates on your health and to receive a thorough exam.  Visit your dentist for a routine exam and preventive care every 6 months. Brush your teeth twice a day and floss once a day. Good oral hygiene prevents tooth decay and gum disease.  The frequency of eye exams is based on your age, health, family medical history, use of contact lenses, and other factors. Follow your health care provider's recommendations for frequency of eye exams.  Eat a healthy diet. Foods like vegetables, fruits, whole grains, low-fat dairy products, and lean protein foods contain the nutrients you need without too many calories. Decrease your intake of foods high in solid fats, added sugars, and salt. Eat the right amount of calories for you.Get information about a proper diet from your health care provider, if necessary.  Regular physical exercise is one of the most important things you can do for your health. Most adults should get at least 150 minutes of moderate-intensity exercise (any activity that increases your heart rate and causes you to sweat) each week. In addition, most adults need muscle-strengthening exercises on 2 or more days a week.  Maintain a healthy weight. The body mass index (BMI) is a screening tool to identify possible weight problems. It provides an estimate of body fat based on height and weight. Your health care provider can find your BMI and can help you achieve or maintain a healthy weight.For adults 20 years and older:  A BMI below 18.5 is considered underweight.  A BMI of 18.5 to 24.9 is normal.  A BMI of 25 to 29.9 is considered overweight.  A BMI of  30 and above is considered obese.  Maintain normal blood lipids and cholesterol levels by exercising and minimizing your intake of saturated fat. Eat a balanced diet with plenty of fruit and vegetables. Blood tests for lipids and cholesterol should begin at age 76 and be repeated every 5 years. If your lipid or cholesterol levels are high, you are over 50, or you are at high risk for heart disease, you may need your cholesterol levels checked more frequently.Ongoing high lipid and cholesterol levels should be treated with medicines if diet and exercise are not working.  If you smoke, find out from your health care provider how to quit. If you do not use tobacco, do not start.  Lung cancer screening is recommended for adults aged 22-80 years who are at high risk for developing lung cancer because of a history of smoking. A yearly low-dose CT scan of the lungs is recommended for people who have at least a 30-pack-year history of smoking and are a current smoker or have quit within the past 15 years. A pack year of smoking is smoking an average of 1 pack of cigarettes a day for 1 year (for example: 1 pack a day for 30 years or 2 packs a day for 15 years). Yearly screening should continue until the smoker has stopped smoking for at least 15 years. Yearly screening should be stopped for people who develop a health problem that would prevent them from having lung cancer treatment.  If you are pregnant, do not drink alcohol. If you are breastfeeding,  be very cautious about drinking alcohol. If you are not pregnant and choose to drink alcohol, do not have more than 1 drink per day. One drink is considered to be 12 ounces (355 mL) of beer, 5 ounces (148 mL) of wine, or 1.5 ounces (44 mL) of liquor.  Avoid use of street drugs. Do not share needles with anyone. Ask for help if you need support or instructions about stopping the use of drugs.  High blood pressure causes heart disease and increases the risk of  stroke. Your blood pressure should be checked at least every 1 to 2 years. Ongoing high blood pressure should be treated with medicines if weight loss and exercise do not work.  If you are 75-52 years old, ask your health care provider if you should take aspirin to prevent strokes.  Diabetes screening involves taking a blood sample to check your fasting blood sugar level. This should be done once every 3 years, after age 15, if you are within normal weight and without risk factors for diabetes. Testing should be considered at a younger age or be carried out more frequently if you are overweight and have at least 1 risk factor for diabetes.  Breast cancer screening is essential preventive care for women. You should practice "breast self-awareness." This means understanding the normal appearance and feel of your breasts and may include breast self-examination. Any changes detected, no matter how small, should be reported to a health care provider. Women in their 58s and 30s should have a clinical breast exam (CBE) by a health care provider as part of a regular health exam every 1 to 3 years. After age 16, women should have a CBE every year. Starting at age 53, women should consider having a mammogram (breast X-ray test) every year. Women who have a family history of breast cancer should talk to their health care provider about genetic screening. Women at a high risk of breast cancer should talk to their health care providers about having an MRI and a mammogram every year.  Breast cancer gene (BRCA)-related cancer risk assessment is recommended for women who have family members with BRCA-related cancers. BRCA-related cancers include breast, ovarian, tubal, and peritoneal cancers. Having family members with these cancers may be associated with an increased risk for harmful changes (mutations) in the breast cancer genes BRCA1 and BRCA2. Results of the assessment will determine the need for genetic counseling and  BRCA1 and BRCA2 testing.  Routine pelvic exams to screen for cancer are no longer recommended for nonpregnant women who are considered low risk for cancer of the pelvic organs (ovaries, uterus, and vagina) and who do not have symptoms. Ask your health care provider if a screening pelvic exam is right for you.  If you have had past treatment for cervical cancer or a condition that could lead to cancer, you need Pap tests and screening for cancer for at least 20 years after your treatment. If Pap tests have been discontinued, your risk factors (such as having a new sexual partner) need to be reassessed to determine if screening should be resumed. Some women have medical problems that increase the chance of getting cervical cancer. In these cases, your health care provider may recommend more frequent screening and Pap tests.  The HPV test is an additional test that may be used for cervical cancer screening. The HPV test looks for the virus that can cause the cell changes on the cervix. The cells collected during the Pap test can be  tested for HPV. The HPV test could be used to screen women aged 30 years and older, and should be used in women of any age who have unclear Pap test results. After the age of 30, women should have HPV testing at the same frequency as a Pap test.  Colorectal cancer can be detected and often prevented. Most routine colorectal cancer screening begins at the age of 50 years and continues through age 75 years. However, your health care provider may recommend screening at an earlier age if you have risk factors for colon cancer. On a yearly basis, your health care provider may provide home test kits to check for hidden blood in the stool. Use of a small camera at the end of a tube, to directly examine the colon (sigmoidoscopy or colonoscopy), can detect the earliest forms of colorectal cancer. Talk to your health care provider about this at age 50, when routine screening begins. Direct  exam of the colon should be repeated every 5-10 years through age 75 years, unless early forms of pre-cancerous polyps or small growths are found.  People who are at an increased risk for hepatitis B should be screened for this virus. You are considered at high risk for hepatitis B if:  You were born in a country where hepatitis B occurs often. Talk with your health care provider about which countries are considered high risk.  Your parents were born in a high-risk country and you have not received a shot to protect against hepatitis B (hepatitis B vaccine).  You have HIV or AIDS.  You use needles to inject street drugs.  You live with, or have sex with, someone who has hepatitis B.  You get hemodialysis treatment.  You take certain medicines for conditions like cancer, organ transplantation, and autoimmune conditions.  Hepatitis C blood testing is recommended for all people born from 1945 through 1965 and any individual with known risks for hepatitis C.  Practice safe sex. Use condoms and avoid high-risk sexual practices to reduce the spread of sexually transmitted infections (STIs). STIs include gonorrhea, chlamydia, syphilis, trichomonas, herpes, HPV, and human immunodeficiency virus (HIV). Herpes, HIV, and HPV are viral illnesses that have no cure. They can result in disability, cancer, and death.  You should be screened for sexually transmitted illnesses (STIs) including gonorrhea and chlamydia if:  You are sexually active and are younger than 24 years.  You are older than 24 years and your health care provider tells you that you are at risk for this type of infection.  Your sexual activity has changed since you were last screened and you are at an increased risk for chlamydia or gonorrhea. Ask your health care provider if you are at risk.  If you are at risk of being infected with HIV, it is recommended that you take a prescription medicine daily to prevent HIV infection. This is  called preexposure prophylaxis (PrEP). You are considered at risk if:  You are a heterosexual woman, are sexually active, and are at increased risk for HIV infection.  You take drugs by injection.  You are sexually active with a partner who has HIV.  Talk with your health care provider about whether you are at high risk of being infected with HIV. If you choose to begin PrEP, you should first be tested for HIV. You should then be tested every 3 months for as long as you are taking PrEP.  Osteoporosis is a disease in which the bones lose minerals and strength   with aging. This can result in serious bone fractures or breaks. The risk of osteoporosis can be identified using a bone density scan. Women ages 65 years and over and women at risk for fractures or osteoporosis should discuss screening with their health care providers. Ask your health care provider whether you should take a calcium supplement or vitamin D to reduce the rate of osteoporosis.  Menopause can be associated with physical symptoms and risks. Hormone replacement therapy is available to decrease symptoms and risks. You should talk to your health care provider about whether hormone replacement therapy is right for you.  Use sunscreen. Apply sunscreen liberally and repeatedly throughout the day. You should seek shade when your shadow is shorter than you. Protect yourself by wearing long sleeves, pants, a wide-brimmed hat, and sunglasses year round, whenever you are outdoors.  Once a month, do a whole body skin exam, using a mirror to look at the skin on your back. Tell your health care provider of new moles, moles that have irregular borders, moles that are larger than a pencil eraser, or moles that have changed in shape or color.  Stay current with required vaccines (immunizations).  Influenza vaccine. All adults should be immunized every year.  Tetanus, diphtheria, and acellular pertussis (Td, Tdap) vaccine. Pregnant women should  receive 1 dose of Tdap vaccine during each pregnancy. The dose should be obtained regardless of the length of time since the last dose. Immunization is preferred during the 27th-36th week of gestation. An adult who has not previously received Tdap or who does not know her vaccine status should receive 1 dose of Tdap. This initial dose should be followed by tetanus and diphtheria toxoids (Td) booster doses every 10 years. Adults with an unknown or incomplete history of completing a 3-dose immunization series with Td-containing vaccines should begin or complete a primary immunization series including a Tdap dose. Adults should receive a Td booster every 10 years.  Varicella vaccine. An adult without evidence of immunity to varicella should receive 2 doses or a second dose if she has previously received 1 dose. Pregnant females who do not have evidence of immunity should receive the first dose after pregnancy. This first dose should be obtained before leaving the health care facility. The second dose should be obtained 4-8 weeks after the first dose.  Human papillomavirus (HPV) vaccine. Females aged 13-26 years who have not received the vaccine previously should obtain the 3-dose series. The vaccine is not recommended for use in pregnant females. However, pregnancy testing is not needed before receiving a dose. If a female is found to be pregnant after receiving a dose, no treatment is needed. In that case, the remaining doses should be delayed until after the pregnancy. Immunization is recommended for any person with an immunocompromised condition through the age of 26 years if she did not get any or all doses earlier. During the 3-dose series, the second dose should be obtained 4-8 weeks after the first dose. The third dose should be obtained 24 weeks after the first dose and 16 weeks after the second dose.  Zoster vaccine. One dose is recommended for adults aged 60 years or older unless certain conditions are  present.  Measles, mumps, and rubella (MMR) vaccine. Adults born before 1957 generally are considered immune to measles and mumps. Adults born in 1957 or later should have 1 or more doses of MMR vaccine unless there is a contraindication to the vaccine or there is laboratory evidence of immunity to   each of the three diseases. A routine second dose of MMR vaccine should be obtained at least 28 days after the first dose for students attending postsecondary schools, health care workers, or international travelers. People who received inactivated measles vaccine or an unknown type of measles vaccine during 1963-1967 should receive 2 doses of MMR vaccine. People who received inactivated mumps vaccine or an unknown type of mumps vaccine before 1979 and are at high risk for mumps infection should consider immunization with 2 doses of MMR vaccine. For females of childbearing age, rubella immunity should be determined. If there is no evidence of immunity, females who are not pregnant should be vaccinated. If there is no evidence of immunity, females who are pregnant should delay immunization until after pregnancy. Unvaccinated health care workers born before 1957 who lack laboratory evidence of measles, mumps, or rubella immunity or laboratory confirmation of disease should consider measles and mumps immunization with 2 doses of MMR vaccine or rubella immunization with 1 dose of MMR vaccine.  Pneumococcal 13-valent conjugate (PCV13) vaccine. When indicated, a person who is uncertain of her immunization history and has no record of immunization should receive the PCV13 vaccine. An adult aged 19 years or older who has certain medical conditions and has not been previously immunized should receive 1 dose of PCV13 vaccine. This PCV13 should be followed with a dose of pneumococcal polysaccharide (PPSV23) vaccine. The PPSV23 vaccine dose should be obtained at least 8 weeks after the dose of PCV13 vaccine. An adult aged 19  years or older who has certain medical conditions and previously received 1 or more doses of PPSV23 vaccine should receive 1 dose of PCV13. The PCV13 vaccine dose should be obtained 1 or more years after the last PPSV23 vaccine dose.  Pneumococcal polysaccharide (PPSV23) vaccine. When PCV13 is also indicated, PCV13 should be obtained first. All adults aged 65 years and older should be immunized. An adult younger than age 65 years who has certain medical conditions should be immunized. Any person who resides in a nursing home or long-term care facility should be immunized. An adult smoker should be immunized. People with an immunocompromised condition and certain other conditions should receive both PCV13 and PPSV23 vaccines. People with human immunodeficiency virus (HIV) infection should be immunized as soon as possible after diagnosis. Immunization during chemotherapy or radiation therapy should be avoided. Routine use of PPSV23 vaccine is not recommended for American Indians, [place] Natives, or people younger than 65 years unless there are medical conditions that require PPSV23 vaccine. When indicated, people who have unknown immunization and have no record of immunization should receive PPSV23 vaccine. One-time revaccination 5 years after the first dose of PPSV23 is recommended for people aged 19-64 years who have chronic kidney failure, nephrotic syndrome, asplenia, or immunocompromised conditions. People who received 1-2 doses of PPSV23 before age 65 years should receive another dose of PPSV23 vaccine at age 65 years or later if at least 5 years have passed since the previous dose. Doses of PPSV23 are not needed for people immunized with PPSV23 at or after age 65 years.  Meningococcal vaccine. Adults with asplenia or persistent complement component deficiencies should receive 2 doses of quadrivalent meningococcal conjugate (MenACWY-D) vaccine. The doses should be obtained at least 2 months apart.  Microbiologists working with certain meningococcal bacteria, military recruits, people at risk during an outbreak, and people who travel to or live in countries with a high rate of meningitis should be immunized. A first-year college student up through age   21 years who is living in a residence hall should receive a dose if she did not receive a dose on or after her 16th birthday. Adults who have certain high-risk conditions should receive one or more doses of vaccine.  Hepatitis A vaccine. Adults who wish to be protected from this disease, have certain high-risk conditions, work with hepatitis A-infected animals, work in hepatitis A research labs, or travel to or work in countries with a high rate of hepatitis A should be immunized. Adults who were previously unvaccinated and who anticipate close contact with an international adoptee during the first 60 days after arrival in the [place] from a country with a high rate of hepatitis A should be immunized.  Hepatitis B vaccine. Adults who wish to be protected from this disease, have certain high-risk conditions, may be exposed to blood or other infectious body fluids, are household contacts or sex partners of hepatitis B positive people, are clients or workers in certain care facilities, or travel to or work in countries with a high rate of hepatitis B should be immunized.  Haemophilus influenzae type b (Hib) vaccine. A previously unvaccinated person with asplenia or sickle cell disease or having a scheduled splenectomy should receive 1 dose of Hib vaccine. Regardless of previous immunization, a recipient of a hematopoietic stem cell transplant should receive a 3-dose series 6-12 months after her successful transplant. Hib vaccine is not recommended for adults with HIV infection. Preventive Services / Frequency Ages 64 to 68 years  Blood pressure check.** / Every 1 to 2 years.  Lipid and cholesterol check.** / Every 5 years beginning at age  22.  Clinical breast exam.** / Every 3 years for women in their 88s and 53s.  BRCA-related cancer risk assessment.** / For women who have family members with a BRCA-related cancer (breast, ovarian, tubal, or peritoneal cancers).  Pap test.** / Every 2 years from ages 90 through 51. Every 3 years starting at age 21 through age 56 or 3 with a history of 3 consecutive normal Pap tests.  HPV screening.** / Every 3 years from ages 24 through ages 1 to 46 with a history of 3 consecutive normal Pap tests.  Hepatitis C blood test.** / For any individual with known risks for hepatitis C.  Skin self-exam. / Monthly.  Influenza vaccine. / Every year.  Tetanus, diphtheria, and acellular pertussis (Tdap, Td) vaccine.** / Consult your health care provider. Pregnant women should receive 1 dose of Tdap vaccine during each pregnancy. 1 dose of Td every 10 years.  Varicella vaccine.** / Consult your health care provider. Pregnant females who do not have evidence of immunity should receive the first dose after pregnancy.  HPV vaccine. / 3 doses over 6 months, if 72 and younger. The vaccine is not recommended for use in pregnant females. However, pregnancy testing is not needed before receiving a dose.  Measles, mumps, rubella (MMR) vaccine.** / You need at least 1 dose of MMR if you were born in 1957 or later. You may also need a 2nd dose. For females of childbearing age, rubella immunity should be determined. If there is no evidence of immunity, females who are not pregnant should be vaccinated. If there is no evidence of immunity, females who are pregnant should delay immunization until after pregnancy.  Pneumococcal 13-valent conjugate (PCV13) vaccine.** / Consult your health care provider.  Pneumococcal polysaccharide (PPSV23) vaccine.** / 1 to 2 doses if you smoke cigarettes or if you have certain conditions.  Meningococcal vaccine.** /  1 dose if you are age 19 to 21 years and a first-year college  student living in a residence hall, or have one of several medical conditions, you need to get vaccinated against meningococcal disease. You may also need additional booster doses.  Hepatitis A vaccine.** / Consult your health care provider.  Hepatitis B vaccine.** / Consult your health care provider.  Haemophilus influenzae type b (Hib) vaccine.** / Consult your health care provider. Ages 40 to 64 years  Blood pressure check.** / Every 1 to 2 years.  Lipid and cholesterol check.** / Every 5 years beginning at age 20 years.  Lung cancer screening. / Every year if you are aged 55-80 years and have a 30-pack-year history of smoking and currently smoke or have quit within the past 15 years. Yearly screening is stopped once you have quit smoking for at least 15 years or develop a health problem that would prevent you from having lung cancer treatment.  Clinical breast exam.** / Every year after age 40 years.  BRCA-related cancer risk assessment.** / For women who have family members with a BRCA-related cancer (breast, ovarian, tubal, or peritoneal cancers).  Mammogram.** / Every year beginning at age 40 years and continuing for as long as you are in good health. Consult with your health care provider.  Pap test.** / Every 3 years starting at age 30 years through age 65 or 70 years with a history of 3 consecutive normal Pap tests.  HPV screening.** / Every 3 years from ages 30 years through ages 65 to 70 years with a history of 3 consecutive normal Pap tests.  Fecal occult blood test (FOBT) of stool. / Every year beginning at age 50 years and continuing until age 75 years. You may not need to do this test if you get a colonoscopy every 10 years.  Flexible sigmoidoscopy or colonoscopy.** / Every 5 years for a flexible sigmoidoscopy or every 10 years for a colonoscopy beginning at age 50 years and continuing until age 75 years.  Hepatitis C blood test.** / For all people born from 1945 through  1965 and any individual with known risks for hepatitis C.  Skin self-exam. / Monthly.  Influenza vaccine. / Every year.  Tetanus, diphtheria, and acellular pertussis (Tdap/Td) vaccine.** / Consult your health care provider. Pregnant women should receive 1 dose of Tdap vaccine during each pregnancy. 1 dose of Td every 10 years.  Varicella vaccine.** / Consult your health care provider. Pregnant females who do not have evidence of immunity should receive the first dose after pregnancy.  Zoster vaccine.** / 1 dose for adults aged 60 years or older.  Measles, mumps, rubella (MMR) vaccine.** / You need at least 1 dose of MMR if you were born in 1957 or later. You may also need a 2nd dose. For females of childbearing age, rubella immunity should be determined. If there is no evidence of immunity, females who are not pregnant should be vaccinated. If there is no evidence of immunity, females who are pregnant should delay immunization until after pregnancy.  Pneumococcal 13-valent conjugate (PCV13) vaccine.** / Consult your health care provider.  Pneumococcal polysaccharide (PPSV23) vaccine.** / 1 to 2 doses if you smoke cigarettes or if you have certain conditions.  Meningococcal vaccine.** / Consult your health care provider.  Hepatitis A vaccine.** / Consult your health care provider.  Hepatitis B vaccine.** / Consult your health care provider.  Haemophilus influenzae type b (Hib) vaccine.** / Consult your health care provider. Ages 65   years and over  Blood pressure check.** / Every 1 to 2 years.  Lipid and cholesterol check.** / Every 5 years beginning at age 22 years.  Lung cancer screening. / Every year if you are aged 73-80 years and have a 30-pack-year history of smoking and currently smoke or have quit within the past 15 years. Yearly screening is stopped once you have quit smoking for at least 15 years or develop a health problem that would prevent you from having lung cancer  treatment.  Clinical breast exam.** / Every year after age 4 years.  BRCA-related cancer risk assessment.** / For women who have family members with a BRCA-related cancer (breast, ovarian, tubal, or peritoneal cancers).  Mammogram.** / Every year beginning at age 40 years and continuing for as long as you are in good health. Consult with your health care provider.  Pap test.** / Every 3 years starting at age 9 years through age 34 or 91 years with 3 consecutive normal Pap tests. Testing can be stopped between 65 and 70 years with 3 consecutive normal Pap tests and no abnormal Pap or HPV tests in the past 10 years.  HPV screening.** / Every 3 years from ages 57 years through ages 64 or 45 years with a history of 3 consecutive normal Pap tests. Testing can be stopped between 65 and 70 years with 3 consecutive normal Pap tests and no abnormal Pap or HPV tests in the past 10 years.  Fecal occult blood test (FOBT) of stool. / Every year beginning at age 15 years and continuing until age 17 years. You may not need to do this test if you get a colonoscopy every 10 years.  Flexible sigmoidoscopy or colonoscopy.** / Every 5 years for a flexible sigmoidoscopy or every 10 years for a colonoscopy beginning at age 86 years and continuing until age 71 years.  Hepatitis C blood test.** / For all people born from [birth year] through 1965 and any individual with known risks for hepatitis C.  Osteoporosis screening.** / A one-time screening for women ages 83 years and over and women at risk for fractures or osteoporosis.  Skin self-exam. / Monthly.  Influenza vaccine. / Every year.  Tetanus, diphtheria, and acellular pertussis (Tdap/Td) vaccine.** / 1 dose of Td every 10 years.  Varicella vaccine.** / Consult your health care provider.  Zoster vaccine.** / 1 dose for adults aged 61 years or older.  Pneumococcal 13-valent conjugate (PCV13) vaccine.** / Consult your health care provider.  Pneumococcal  polysaccharide (PPSV23) vaccine.** / 1 dose for all adults aged 28 years and older.  Meningococcal vaccine.** / Consult your health care provider.  Hepatitis A vaccine.** / Consult your health care provider.  Hepatitis B vaccine.** / Consult your health care provider.  Haemophilus influenzae type b (Hib) vaccine.** / Consult your health care provider. ** Family history and personal history of risk and conditions may change your health care provider's recommendations. Document Released: 10/31/2001 Document Revised: 01/19/2014 Document Reviewed: 01/30/2011 [facility] Patient Information 2015 [place], [place]. This information is not intended to replace advice given to you by your health care provider. Make sure you discuss any questions you have with your health care provider.

## 2014-10-30 NOTE — Progress Notes (Signed)
Pre visit review using our clinic review tool, if applicable. No additional management support is needed unless otherwise documented below in the visit note. 

## 2014-11-02 LAB — EPSTEIN-BARR VIRUS VCA ANTIBODY PANEL
EBV EA IgG: <5 U/mL (ref <9.0)
EBV NA IgG: >600 U/mL — ABNORMAL HIGH (ref <18.0)
EBV VCA IgG: 111 U/mL — ABNORMAL HIGH (ref <18.0)
EBV VCA IgM: 10.1 U/mL (ref <36.0)

## 2014-11-03 ENCOUNTER — Encounter: Payer: Self-pay | Admitting: Family Medicine

## 2014-11-03 LAB — HSV 2 ANTIBODY, IGG: HSV 2 Glycoprotein G Ab, IgG: 7.56 IV — ABNORMAL HIGH

## 2014-11-04 LAB — VITAMIN D 25 HYDROXY (VIT D DEFICIENCY, FRACTURES): VITD: 7.94 ng/mL — ABNORMAL LOW (ref 30.00–100.00)

## 2014-11-04 NOTE — Addendum Note (Signed)
Addended by: Eustace Quail on: 11/04/2014 08:10 AM   Modules accepted: Orders

## 2014-11-09 ENCOUNTER — Other Ambulatory Visit: Payer: Self-pay

## 2014-11-09 MED ORDER — VITAMIN D (ERGOCALCIFEROL) 1.25 MG (50000 UNIT) PO CAPS: 50000.0000 [IU] | ORAL_CAPSULE | ORAL | Status: DC

## 2014-11-25 ENCOUNTER — Other Ambulatory Visit: Payer: Self-pay | Admitting: Family Medicine

## 2014-12-03 NOTE — Telephone Encounter (Signed)
Please review my-chart messages, and advise    KP

## 2014-12-17 MED ORDER — TRAMADOL HCL 50 MG PO TABS: 50.0000 mg | ORAL_TABLET | Freq: Three times a day (TID) | ORAL | Status: DC | PRN

## 2014-12-17 NOTE — Telephone Encounter (Signed)
Rx faxed.    KP 

## 2014-12-17 NOTE — Addendum Note (Signed)
Addended by: Arnette Norris on: 12/17/2014 05:01 PM   Modules accepted: Orders

## 2014-12-17 NOTE — Telephone Encounter (Signed)
Ultram 50 mg 1 po q6h prn #30  --- if it does not improve come in

## 2014-12-22 ENCOUNTER — Ambulatory Visit (INDEPENDENT_AMBULATORY_CARE_PROVIDER_SITE_OTHER): Payer: BLUE CROSS/BLUE SHIELD | Admitting: Family Medicine

## 2014-12-22 ENCOUNTER — Encounter: Payer: Self-pay | Admitting: Family Medicine

## 2014-12-22 VITALS — BP 120/92 | HR 89 | Temp 98.9°F | Resp 18 | Ht 68.0 in | Wt 273.0 lb

## 2014-12-22 DIAGNOSIS — N390 Urinary tract infection, site not specified: Secondary | ICD-10-CM

## 2014-12-22 DIAGNOSIS — N760 Acute vaginitis: Secondary | ICD-10-CM

## 2014-12-22 DIAGNOSIS — E538 Deficiency of other specified B group vitamins: Secondary | ICD-10-CM

## 2014-12-22 DIAGNOSIS — R300 Dysuria: Secondary | ICD-10-CM

## 2014-12-22 LAB — POCT URINALYSIS DIPSTICK
Glucose, UA: NEGATIVE
Ketones, UA: NEGATIVE
Nitrite, UA: POSITIVE
Spec Grav, UA: >=1.03
Urobilinogen, UA: 2
pH, UA: 6

## 2014-12-22 MED ORDER — CIPROFLOXACIN HCL 250 MG PO TABS: 250.0000 mg | ORAL_TABLET | Freq: Two times a day (BID) | ORAL | Status: DC

## 2014-12-22 MED ORDER — CYANOCOBALAMIN 1000 MCG/ML IJ SOLN
1000.0000 ug | Freq: Once | INTRAMUSCULAR | Status: AC
  Administered 2014-12-22: 1000 ug via INTRAMUSCULAR

## 2014-12-22 MED ORDER — NITROFURANTOIN MONOHYD MACRO 100 MG PO CAPS: ORAL_CAPSULE | ORAL | Status: DC

## 2014-12-22 MED ORDER — FLUCONAZOLE 150 MG PO TABS: ORAL_TABLET | ORAL | Status: DC

## 2014-12-22 NOTE — Patient Instructions (Signed)

## 2014-12-22 NOTE — Progress Notes (Signed)
TZG:YFVCBS Lowne, DO Chief Complaint  Patient presents with  . Dysuria    Also having frequency for 3 days    Current Issues:  Presents with few days of dysuria, urinary urgency and urinary frequency Associated symptoms include:  dysuria, urinary frequency and urinary urgency  There is no history of of similar symptoms. Sexually active:  Yes with female.   No concern for STI.  Prior to Admission medications   Medication Sig Start Date End Date Taking? Authorizing Provider  albuterol (PROAIR HFA) 108 (90 BASE) MCG/ACT inhaler Inhale 2 puffs into the lungs every 6 (six) hours as needed. 10/30/14  Yes Lelon Perla, DO  ALPRAZolam (XANAX) 0.25 MG tablet Take 1 tablet (0.25 mg total) by mouth 3 (three) times daily as needed for sleep. 10/30/14  Yes Lelon Perla, DO  cyclobenzaprine (FLEXERIL) 10 MG tablet Take 1 tablet (10 mg total) by mouth 3 (three) times daily as needed for muscle spasms. 10/30/14  Yes Linzy Darling R Lowne, DO  FLUoxetine (PROZAC) 20 MG capsule Take 1 capsule (20 mg total) by mouth daily. 11/25/14  Yes Shaquoia Miers R Lowne, DO  traMADol (ULTRAM) 50 MG tablet Take 1 tablet (50 mg total) by mouth every 8 (eight) hours as needed. 12/17/14  Yes Grayling Congress Lowne, DO  valACYclovir (VALTREX) 1000 MG tablet Take 1 tablet (1,000 mg total) by mouth daily. 10/30/14  Yes Lelon Perla, DO  Vitamin D, Ergocalciferol, (DRISDOL) 50000 UNITS CAPS capsule Take 1 capsule (50,000 Units total) by mouth every 7 (seven) days. 11/09/14  Yes Grayling Congress Lowne, DO  ciprofloxacin (CIPRO) 250 MG tablet Take 1 tablet (250 mg total) by mouth 2 (two) times daily. 12/22/14   Lelon Perla, DO  fluconazole (DIFLUCAN) 150 MG tablet 1 po x1, may repeat in 3 days prn 12/22/14   Lelon Perla, DO  nitrofurantoin, macrocrystal-monohydrate, (MACROBID) 100 MG capsule 1 po x1 after intercourse 12/22/14   Lelon Perla, DO    Review of Systems:as above  PE:  BP 120/92 mmHg  Pulse 89  Temp(Src) 98.9 F (37.2 C) (Oral)  Resp 18  Ht  5\' 8"  (1.727 m)  Wt 273 lb (123.832 kg)  BMI 41.52 kg/m2  SpO2 99%  LMP 12/10/2014 Constitutional--no fever, no chills Heart---s1s2  Lungs== ctab/l  Back--- no flank pain Abdomen-- soft NT  Pelvic---- not done  Results for orders placed or performed in visit on 12/22/14  POCT Urinalysis Dipstick  Result Value Ref Range   Color, UA Orange    Clarity, UA Clear    Glucose, UA Neg    Bilirubin, UA Small    Ketones, UA Neg    Spec Grav, UA >=1.030    Blood, UA Small    pH, UA 6.0    Protein, UA Small    Urobilinogen, UA 2.0    Nitrite, UA Positive    Leukocytes, UA moderate (2+)     Assessment and Plan:  1. Dysuria cipro  Per orders - POCT Urinalysis Dipstick - Urine Culture  2. Urinary tract infection without hematuria, site unspecified  - ciprofloxacin (CIPRO) 250 MG tablet; Take 1 tablet (250 mg total) by mouth 2 (two) times daily.  Dispense: 10 tablet; Refill: 0 - nitrofurantoin, macrocrystal-monohydrate, (MACROBID) 100 MG capsule; 1 po x1 after intercourse  Dispense: 30 capsule; Refill: 1  3. Vaginitis and vulvovaginitis  - fluconazole (DIFLUCAN) 150 MG tablet; 1 po x1, may repeat in 3 days prn  Dispense: 2 tablet; Refill: 3  4. B12 deficiency  - cyanocobalamin ((VITAMIN B-12)) injection 1,000 mcg; Inject 1 mL (1,000 mcg total) into the muscle once.

## 2014-12-22 NOTE — Progress Notes (Signed)
Pre visit review using our clinic review tool, if applicable. No additional management support is needed unless otherwise documented below in the visit note. 

## 2014-12-23 LAB — URINE CULTURE: Colony Count: 30000

## 2014-12-25 ENCOUNTER — Encounter: Payer: Self-pay | Admitting: Family Medicine

## 2014-12-25 ENCOUNTER — Other Ambulatory Visit: Payer: Self-pay | Admitting: Family Medicine

## 2014-12-25 DIAGNOSIS — A6000 Herpesviral infection of urogenital system, unspecified: Secondary | ICD-10-CM

## 2014-12-25 MED ORDER — VALACYCLOVIR HCL 1 G PO TABS: 1000.0000 mg | ORAL_TABLET | Freq: Three times a day (TID) | ORAL | Status: DC

## 2014-12-29 ENCOUNTER — Telehealth: Payer: Self-pay | Admitting: *Deleted

## 2014-12-29 ENCOUNTER — Ambulatory Visit (INDEPENDENT_AMBULATORY_CARE_PROVIDER_SITE_OTHER): Payer: BLUE CROSS/BLUE SHIELD | Admitting: *Deleted

## 2014-12-29 ENCOUNTER — Ambulatory Visit: Payer: BLUE CROSS/BLUE SHIELD

## 2014-12-29 DIAGNOSIS — E538 Deficiency of other specified B group vitamins: Secondary | ICD-10-CM

## 2014-12-29 MED ORDER — CYANOCOBALAMIN 1000 MCG/ML IJ SOLN
1000.0000 ug | Freq: Once | INTRAMUSCULAR | Status: AC
  Administered 2014-12-29: 1000 ug via INTRAMUSCULAR

## 2014-12-29 NOTE — Progress Notes (Signed)
Pre visit review using our clinic review tool, if applicable. No additional management support is needed unless otherwise documented below in the visit note.  Verbal order from Dr. Laury Axon that patient is to get weekly B12 injections x4 then monthly.   Patient tolerated injection well.  Next injection scheduled 01/05/15.

## 2014-12-29 NOTE — Telephone Encounter (Signed)
Patient stating that she has to pay a $30 copay every B12 injection visit, so she is wondering if she can go ahead and be taught to inject them so she can do injections at home.   Please advise.

## 2014-12-29 NOTE — Telephone Encounter (Signed)
Yes-- they would need to be SQ

## 2015-01-04 MED ORDER — "SYRINGE/NEEDLE (DISP) 25G X 5/8"" 3 ML MISC": 1.0000 | Status: DC

## 2015-01-04 MED ORDER — CYANOCOBALAMIN 1000 MCG/ML IJ SOLN: 1000.0000 ug | INTRAMUSCULAR | Status: DC

## 2015-01-04 NOTE — Telephone Encounter (Signed)
Rx sent, future B12 lab ordered, patient notified.

## 2015-01-04 NOTE — Telephone Encounter (Signed)
See labs from 2 months ago---- we were supposed to recheck in 1 month  And yes she was to get weekly shots x 4 then 1x a month

## 2015-01-04 NOTE — Telephone Encounter (Signed)
Please verify because I can't find it written in the notes- patient states she was to get B12 injections x4 weekly and then monthly for 3 months, then recheck b12.

## 2015-01-05 ENCOUNTER — Encounter: Payer: Self-pay | Admitting: Family Medicine

## 2015-01-05 ENCOUNTER — Ambulatory Visit: Payer: BLUE CROSS/BLUE SHIELD

## 2015-01-05 ENCOUNTER — Other Ambulatory Visit: Payer: Self-pay | Admitting: Family Medicine

## 2015-01-05 DIAGNOSIS — F419 Anxiety disorder, unspecified: Secondary | ICD-10-CM

## 2015-01-05 DIAGNOSIS — A6000 Herpesviral infection of urogenital system, unspecified: Secondary | ICD-10-CM

## 2015-01-05 DIAGNOSIS — F329 Major depressive disorder, single episode, unspecified: Secondary | ICD-10-CM

## 2015-01-05 DIAGNOSIS — F32A Depression, unspecified: Secondary | ICD-10-CM

## 2015-01-05 MED ORDER — FLUOXETINE HCL 20 MG PO CAPS: 20.0000 mg | ORAL_CAPSULE | Freq: Every day | ORAL | Status: DC

## 2015-01-05 MED ORDER — VITAMIN D (ERGOCALCIFEROL) 1.25 MG (50000 UNIT) PO CAPS: 50000.0000 [IU] | ORAL_CAPSULE | ORAL | Status: DC

## 2015-01-05 MED ORDER — TRAMADOL HCL 50 MG PO TABS
50.0000 mg | ORAL_TABLET | Freq: Three times a day (TID) | ORAL | Status: DC | PRN
Start: 2015-01-05 — End: 2015-07-08

## 2015-01-05 MED ORDER — ALPRAZOLAM 0.25 MG PO TABS: 0.2500 mg | ORAL_TABLET | Freq: Three times a day (TID) | ORAL | Status: DC | PRN

## 2015-01-05 MED ORDER — VALACYCLOVIR HCL 1 G PO TABS: 1000.0000 mg | ORAL_TABLET | Freq: Three times a day (TID) | ORAL | Status: DC

## 2015-01-05 NOTE — Telephone Encounter (Signed)
Last seen 12/22/14 and filled   Tramadol 12/17/14 #30 Xanax 10/1214 #30   Please advise     KP

## 2015-01-05 NOTE — Addendum Note (Signed)
Addended by: Arnette Norris on: 01/05/2015 10:03 AM   Modules accepted: Orders

## 2015-03-25 ENCOUNTER — Telehealth: Payer: Self-pay

## 2015-03-25 DIAGNOSIS — F32A Depression, unspecified: Secondary | ICD-10-CM

## 2015-03-25 DIAGNOSIS — F419 Anxiety disorder, unspecified: Secondary | ICD-10-CM

## 2015-03-25 DIAGNOSIS — F329 Major depressive disorder, single episode, unspecified: Secondary | ICD-10-CM

## 2015-03-25 MED ORDER — ALPRAZOLAM 0.25 MG PO TABS
0.2500 mg | ORAL_TABLET | Freq: Three times a day (TID) | ORAL | Status: DC | PRN
Start: 2015-03-25 — End: 2015-07-08

## 2015-03-25 NOTE — Telephone Encounter (Signed)
Refill x1 

## 2015-03-25 NOTE — Telephone Encounter (Signed)
  Xanax refill requested Last seen 12/22/14 and filled 01/05/15 #30   Please advise     KP

## 2015-03-25 NOTE — Telephone Encounter (Signed)
Rx faxed.    KP 

## 2015-05-25 ENCOUNTER — Encounter: Payer: Self-pay | Admitting: Family Medicine

## 2015-05-28 ENCOUNTER — Other Ambulatory Visit: Payer: Self-pay

## 2015-05-28 ENCOUNTER — Encounter: Payer: Self-pay | Admitting: Family Medicine

## 2015-05-28 ENCOUNTER — Ambulatory Visit (INDEPENDENT_AMBULATORY_CARE_PROVIDER_SITE_OTHER): Payer: BLUE CROSS/BLUE SHIELD | Admitting: Family Medicine

## 2015-05-28 VITALS — BP 122/80 | HR 95 | Temp 98.5°F | Wt 281.2 lb

## 2015-05-28 DIAGNOSIS — E042 Nontoxic multinodular goiter: Secondary | ICD-10-CM | POA: Diagnosis not present

## 2015-05-28 DIAGNOSIS — E559 Vitamin D deficiency, unspecified: Secondary | ICD-10-CM | POA: Diagnosis not present

## 2015-05-28 DIAGNOSIS — E669 Obesity, unspecified: Secondary | ICD-10-CM

## 2015-05-28 DIAGNOSIS — E049 Nontoxic goiter, unspecified: Secondary | ICD-10-CM | POA: Diagnosis not present

## 2015-05-28 DIAGNOSIS — E538 Deficiency of other specified B group vitamins: Secondary | ICD-10-CM | POA: Diagnosis not present

## 2015-05-28 LAB — THYROID PANEL WITH TSH
Free Thyroxine Index: 1.9 (ref 1.4–3.8)
T3 Uptake: 29 % (ref 22–35)
T4, Total: 6.5 ug/dL (ref 4.5–12.0)
TSH: 1.996 u[IU]/mL (ref 0.350–4.500)

## 2015-05-28 LAB — VITAMIN B12: Vitamin B-12: 325 pg/mL (ref 211–911)

## 2015-05-28 LAB — VITAMIN D 25 HYDROXY (VIT D DEFICIENCY, FRACTURES): VITD: 10.2 ng/mL — ABNORMAL LOW (ref 30.00–100.00)

## 2015-05-28 MED ORDER — VITAMIN D (ERGOCALCIFEROL) 1.25 MG (50000 UNIT) PO CAPS: 50000.0000 [IU] | ORAL_CAPSULE | ORAL | Status: DC

## 2015-05-28 NOTE — Assessment & Plan Note (Signed)
Pt states she has been working out and watching what she eats and she has gained 10 lbs since last ov She is frustrated Will check labs---if all is normal we will bring her back in to discuss diet and options

## 2015-05-28 NOTE — Progress Notes (Signed)
Pre visit review using our clinic review tool, if applicable. No additional management support is needed unless otherwise documented below in the visit note. 

## 2015-05-28 NOTE — Assessment & Plan Note (Signed)
Check labs 

## 2015-05-28 NOTE — Assessment & Plan Note (Signed)
?   If it has inc in size Check US thyroid Check labs today

## 2015-05-28 NOTE — Patient Instructions (Signed)
Goiter Goiter is an enlarged thyroid gland. The thyroid gland sits at the base of the front of the neck. The gland produces hormones that regulate mood, body temperature, pulse rate, and digestion. Most goiters are painless and are not a cause for serious concern. Goiters and conditions that cause goiters can be treated if necessary.  CAUSES  Common causes of goiter include:  Graves disease (causes too much hormone to be produced [hyperthyroidism]).  Hashimoto disease (causes too little hormone to be produced [hypothyroidism]).  Thyroiditis (inflammation of the thyroid sometimes caused by virus or pregnancy).  Nodular goiter (small bumps form; sometimes called toxic nodular goiter).  Pregnancy.  Thyroid cancer (very few goiters with nodules are cancerous).  Certain medications.  Radiation exposure.  Iodine deficiency (more common in developing countries in inland populations). RISK FACTORS Risk factors for goiter include:  A family history of goiter.  Female gender.  Inadequate iodine in the diet.  Age older than 40 years. SYMPTOMS  Many goiters do not cause symptoms. When symptoms do occur, they may include:  Swelling in the lower part of the neck. This swelling can range from a very small bump to a large lump.  A tight feeling in the throat.  A hoarse voice. Less commonly, a goiter may result in:  Coughing.  Wheezing.  Difficulty swallowing.  Difficulty breathing.  Bulging neck veins.  Dizziness. When a goiter is the result of hyperthyroidism, symptoms may include:  Rapid or irregular heartbeat.  Sickness in your stomach (nausea).  Vomiting.  Diarrhea.  Shaking.  Irritable feeling.  Bulging eyes.  Weight loss.  Heat sensitivity.  Anxiety. When a goiter is the result of hypothyroidism, symptoms may include:  Tiredness.  Dry skin.  Constipation.  Weight gain.  Irregular menstrual cycle.  Depressed mood.  Sensitivity to  cold. DIAGNOSIS  Tests used to diagnose goiter include:  A physical exam.  Blood tests, including thyroid hormone levels and antibody testing.  Ultrasonography, computerized X-ray scan (computed tomography, CT) or computerized magnetic scan (magnetic resonance imaging, MRI).  Thyroid scan (imaging along with safe radioactive injection).  Tissue sample taken (biopsy) of nodules. This is sometimes done to confirm that the nodules are not cancerous. TREATMENT  Treatment will depend on the cause of the goiter. Treatment may include:  Monitoring. In some cases, no treatment is necessary, and your doctor will monitor your condition at regular checkups.  Medications and supplements. Thyroid medication (thyroid hormone replacement) is available for hyperthyroidism and hypothyroidism.  If inflammation is the cause, over-the-counter medication or steroid medication may be recommended.  Goiters caused by iodine deficiency can be treated with iodine supplements or changes in diet.  Radioactive iodine treatment. Radioactive iodine is injected into the blood. It travels to the thyroid gland, kills thyroid cells, and reduces the size of the gland. This is only used when the thyroid gland is overactive. Lifelong thyroid hormone medication is often necessary after this treatment.  Surgery. A procedure to remove all or part of the gland may be recommended in severe cases or when cancer is the cause. Hormones can be taken to replace the hormones normally produced by the thyroid. HOME CARE INSTRUCTIONS   Take medications as directed.  Follow your caregiver's recommendations for any dietary changes.  Follow up with your caregiver for further examination and testing, as directed. PREVENTION   If you have a family history of goiter, discuss screening with your doctor.  Make sure you are getting enough iodine in your diet.  Use   of iodized table salt can help prevent iodine deficiency. Document  Released: 02/22/2010 Document Revised: 01/19/2014 Document Reviewed: 02/22/2010 [employer] Patient Information 2015 [employer]. This information is not intended to replace advice given to you by your health care provider. Make sure you discuss any questions you have with your health care provider.  

## 2015-05-28 NOTE — Progress Notes (Signed)
Patient ID: [name], female    DOB: 01/30/1981  Age: 34 y.o. MRN: [id number]    Subjective:  Subjective HPI [name] presents for evaluation for fatigue and recheck b12 and vita D.   She has been gaining weight and feeling more tired.  She is exercising and watching her diet.    Review of Systems  Constitutional: Positive for fatigue. Negative for diaphoresis, appetite change and unexpected weight change.  Eyes: Negative for pain, redness and visual disturbance.  Respiratory: Negative for cough, chest tightness, shortness of breath and wheezing.   Cardiovascular: Negative for chest pain, palpitations and leg swelling.  Endocrine: Negative for cold intolerance, heat intolerance, polydipsia, polyphagia and polyuria.  Genitourinary: Negative for dysuria, frequency and difficulty urinating.  Neurological: Negative for dizziness, light-headedness, numbness and headaches.    History Past Medical History  Diagnosis Date  . Anemia   . Thyroid disease   . Asthma   . Obesity   . Genital herpes     She has past surgical history that includes Keloid excision (2008) and Salivary gland surgery.   Her family history includes Cancer in her maternal grandfather and paternal grandfather; Cancer (age of onset: 63) in her father; Coronary artery disease in an other family member; Depression in her father; Heart disease (age of onset: 30) in her father; Hyperlipidemia in her father and mother; Hypertension in her father and mother; Prostate cancer in an other family member.She reports that she has never smoked. She has never used smokeless tobacco. She reports that she drinks alcohol. She reports that she does not use illicit drugs.  Current Outpatient Prescriptions on File Prior to Visit  Medication Sig Dispense Refill  . albuterol (PROAIR HFA) 108 (90 BASE) MCG/ACT inhaler Inhale 2 puffs into the lungs every 6 (six) hours as needed. 6.7 g 1  . ALPRAZolam (XANAX) 0.25 MG tablet Take  1 tablet (0.25 mg total) by mouth 3 (three) times daily as needed for sleep. 30 tablet 0  . cyanocobalamin (,VITAMIN B-12,) 1000 MCG/ML injection Inject 1 mL (1,000 mcg total) into the skin once a week. Inject weekly for 1 month, then inject monthly for 3 months. 10 mL 0  . cyclobenzaprine (FLEXERIL) 10 MG tablet Take 1 tablet (10 mg total) by mouth 3 (three) times daily as needed for muscle spasms. 30 tablet 0  . FLUoxetine (PROZAC) 20 MG capsule Take 1 capsule (20 mg total) by mouth daily. 30 capsule 5  . SYRINGE-NEEDLE, DISP, 3 ML (B-D SYRINGE/NEEDLE 3CC/25GX5/8) 25G X 5/8" 3 ML MISC Inject 1 Device into the skin once a week. Use weekly for one month and then monthly for 3 months. 5 each 0  . traMADol (ULTRAM) 50 MG tablet Take 1 tablet (50 mg total) by mouth every 8 (eight) hours as needed. 30 tablet 0  . valACYclovir (VALTREX) 1000 MG tablet Take 1 tablet (1,000 mg total) by mouth daily. 90 tablet 3  . Vitamin D, Ergocalciferol, (DRISDOL) 50000 UNITS CAPS capsule Take 1 capsule (50,000 Units total) by mouth every 7 (seven) days. 4 capsule 3   No current facility-administered medications on file prior to visit.     Objective:  Objective Physical Exam  Constitutional: She is oriented to person, place, and time. She appears well-developed and well-nourished.  HENT:  Head: Normocephalic and atraumatic.  Eyes: Conjunctivae and EOM are normal.  Neck: Normal range of motion. Neck supple. No JVD present. Carotid bruit is not present. Thyromegaly present.  Cardiovascular: Normal rate,  regular rhythm and normal heart sounds.   No murmur heard. Pulmonary/Chest: Effort normal and breath sounds normal. No respiratory distress. She has no wheezes. She has no rales. She exhibits no tenderness.  Musculoskeletal: She exhibits no edema.  Neurological: She is alert and oriented to person, place, and time.  Psychiatric: She has a normal mood and affect. Her behavior is normal. Judgment and thought content  normal.  Nursing note and vitals reviewed.  BP 122/80 mmHg  Pulse 95  Temp(Src) 98.5 F (36.9 C) (Oral)  Wt 281 lb 3.2 oz (127.551 kg)  SpO2 98%  LMP 05/03/2015 Wt Readings from Last 3 Encounters:  05/28/15 281 lb 3.2 oz (127.551 kg)  12/22/14 273 lb (123.832 kg)  10/30/14 267 lb 9.6 oz (121.383 kg)     Lab Results  Component Value Date   WBC 10.5 10/30/2014   HGB 12.2 10/30/2014   HCT 38.4 10/30/2014   PLT 328.0 10/30/2014   GLUCOSE 89 10/30/2014   CHOL 112 10/30/2014   TRIG 61.0 10/30/2014   HDL 35.10* 10/30/2014   LDLCALC 65 10/30/2014   ALT 19 10/30/2014   AST 22 10/30/2014   NA 137 10/30/2014   K 3.4* 10/30/2014   CL 103 10/30/2014   CREATININE 0.73 10/30/2014   BUN 4* 10/30/2014   CO2 30 10/30/2014   TSH 2.69 10/30/2014    US Transvaginal Non-ob  09/02/2013   CLINICAL DATA:  Amenorrhea.  EXAM: TRANSVAGINAL ULTRASOUND OF PELVIS  TECHNIQUE: Transvaginal ultrasound examination of the pelvis was performed including evaluation of the uterus, ovaries, adnexal regions, and pelvic cul-de-sac.  COMPARISON:  None.  FINDINGS: Uterus  Measurements: 9.3 x 5.1 x 4.6 cm. No fibroids or other mass visualized.  Endometrium  Thickness: Thickened at 21 mm. Calcifications in the endometrium in the lower uterine segment/cervix region.  Right ovary  Measurements: 5.2 x 2.6 x 3.1 cm small follicles present. There is a 2.8 cm simple appearing cyst. Normal appearance/no adnexal mass.  Left ovary  Measurements: 5.3 x 3.7 x 4.9 cm. 3.5 cm complex cyst with internal echoes, likely hemorrhagic cyst. Multiple follicles.  Other findings:  Trace free fluid.  IMPRESSION: Small bilateral ovarian cysts, with a complex/hemorrhagic cyst on the left ovary. Multiple bilateral ovarian follicles.  Thickened endometrium.  Recommend repeat ultrasound in 2-3 cycles.   Electronically Signed   By: Charlett Nose M.D.   On: 09/02/2013 15:36   US Pelvis Complete  09/02/2013   CLINICAL DATA:  Amenorrhea  EXAM:  TRANSABDOMINAL ULTRASOUND OF PELVIS  TECHNIQUE: Transabdominal ultrasound examination of the pelvis was performed including evaluation of the uterus, ovaries, adnexal regions, and pelvic cul-de-sac.  COMPARISON:  None.  FINDINGS: Uterus  Measurements: 9.5 x 5.1 x 4.6 cm. No fibroids or other mass visualized.  Endometrium  Thickness: 21.2 mm. Area of heterogeneity with small punctate foci of increased echogenicity within the lower uterine segment. These findings likely reflect sequela of mixed chronic and acute blood. The endometrium is otherwise within normal limits for a premenopausal female.  Right ovary  Measurements: 5.2 x 2.6 x 3.1 cm. A 2 x 1.6 x 1.9 cm solid nodules identified within the right ovary. Differential considerations are a complex hemorrhagic cyst versus possibly endometrioma. Surveillance evaluation 6-12 weeks is recommended. A dominant benign-appearing cyst is also identified within the ovary measuring 2.3 x 1.2 x 2 cm. Follicles are also appreciated.  Left ovary  Measurements: 5.3 x 3.7 x 4.9 cm. Follicles and a complex appearing cysts is identified within the  left ovary measuring 3.5 x 2.6 x 3.4 cm. This is likely a hemorrhagic cyst and can be further evaluated in 6-12 weeks upon revaluation of the right ovary.  Other findings:  Trace amount of adnexal fluid.  IMPRESSION: 1. The likely mixed chronic and acute blood products within the lower uterine segment. 2. Solid nodule within the left ovary surveillance evaluation in 6-12 weeks is recommended 3. No further sonographic abnormalities.   Electronically Signed   By: Salome Holmes M.D.   On: 09/02/2013 16:01     Assessment & Plan:  Plan I have discontinued Ms. Abaya's ciprofloxacin, fluconazole, and nitrofurantoin (macrocrystal-monohydrate). I am also having her maintain her valACYclovir, cyclobenzaprine, albuterol, cyanocobalamin, SYRINGE-NEEDLE (DISP) 3 ML, traMADol, Vitamin D (Ergocalciferol), FLUoxetine, and ALPRAZolam.  No orders of  the defined types were placed in this encounter.    Problem List Items Addressed This Visit      Unprioritized   Vitamin D deficiency    Check labs      Obesity    Pt states she has been working out and watching what she eats and she has gained 10 lbs since last ov She is frustrated Will check labs---if all is normal we will bring her back in to discuss diet and options      Goiter    ? If it has inc in size Check US thyroid Check labs today      B12 deficiency    Check labs      Relevant Orders   Thyroid Panel With TSH   B12   Vitamin D (25 hydroxy)    Other Visit Diagnoses    Multinodular goiter    -  Primary    Relevant Orders    US Soft Tissue Head/Neck    Thyroid Panel With TSH       Follow-up: Return if symptoms worsen or fail to improve.  Loreen Freud, DO

## 2015-05-31 ENCOUNTER — Encounter: Payer: Self-pay | Admitting: Family Medicine

## 2015-06-03 ENCOUNTER — Telehealth: Payer: Self-pay | Admitting: Family Medicine

## 2015-06-03 MED ORDER — "SYRINGE/NEEDLE (DISP) 25G X 5/8"" 3 ML MISC": 1.0000 | Status: DC

## 2015-06-03 NOTE — Telephone Encounter (Signed)
Rx faxed.    KP 

## 2015-06-03 NOTE — Telephone Encounter (Signed)
Caller name: [facility]   Reason for call:  Pharmacy requesting a refill SYRINGE-NEEDLE, DISP, 3 ML (B-D SYRINGE/NEEDLE 3CC/25GX5/8) 25G X 5/8" 3 ML MISC

## 2015-06-07 ENCOUNTER — Other Ambulatory Visit (HOSPITAL_BASED_OUTPATIENT_CLINIC_OR_DEPARTMENT_OTHER): Payer: BLUE CROSS/BLUE SHIELD

## 2015-06-14 ENCOUNTER — Ambulatory Visit (HOSPITAL_BASED_OUTPATIENT_CLINIC_OR_DEPARTMENT_OTHER)
Admission: RE | Admit: 2015-06-14 | Discharge: 2015-06-14 | Disposition: A | Discharge status: Home / Self Care | Payer: BLUE CROSS/BLUE SHIELD | Source: Ambulatory Visit | Attending: Family Medicine | Admitting: Family Medicine

## 2015-06-14 DIAGNOSIS — E042 Nontoxic multinodular goiter: Secondary | ICD-10-CM | POA: Diagnosis present

## 2015-07-08 ENCOUNTER — Encounter: Payer: Self-pay | Admitting: Family Medicine

## 2015-07-08 ENCOUNTER — Other Ambulatory Visit: Payer: Self-pay | Admitting: Family Medicine

## 2015-07-08 DIAGNOSIS — F329 Major depressive disorder, single episode, unspecified: Secondary | ICD-10-CM

## 2015-07-08 DIAGNOSIS — F32A Depression, unspecified: Secondary | ICD-10-CM

## 2015-07-08 DIAGNOSIS — M549 Dorsalgia, unspecified: Secondary | ICD-10-CM

## 2015-07-08 DIAGNOSIS — F419 Anxiety disorder, unspecified: Secondary | ICD-10-CM

## 2015-07-08 DIAGNOSIS — A6000 Herpesviral infection of urogenital system, unspecified: Secondary | ICD-10-CM

## 2015-07-08 MED ORDER — ALPRAZOLAM 0.25 MG PO TABS: 0.2500 mg | ORAL_TABLET | Freq: Three times a day (TID) | ORAL | Status: DC | PRN

## 2015-07-08 MED ORDER — CYANOCOBALAMIN 1000 MCG/ML IJ SOLN: 1000.0000 ug | INTRAMUSCULAR | Status: DC

## 2015-07-08 MED ORDER — CYCLOBENZAPRINE HCL 10 MG PO TABS
10.0000 mg | ORAL_TABLET | Freq: Three times a day (TID) | ORAL | Status: DC | PRN
Start: 2015-07-08 — End: 2015-10-11

## 2015-07-08 MED ORDER — TRAMADOL HCL 50 MG PO TABS: 50.0000 mg | ORAL_TABLET | Freq: Three times a day (TID) | ORAL | Status: DC | PRN

## 2015-07-08 MED ORDER — VALACYCLOVIR HCL 1 G PO TABS: 1000.0000 mg | ORAL_TABLET | Freq: Three times a day (TID) | ORAL | Status: DC

## 2015-07-08 NOTE — Telephone Encounter (Signed)
Last seen 05/28/15 and filled   Xanax 03/25/15 #30 Tramadol 01/05/15 #30   Please advise      KP

## 2015-09-16 ENCOUNTER — Telehealth: Payer: Self-pay | Admitting: Family Medicine

## 2015-09-21 ENCOUNTER — Ambulatory Visit: Payer: BLUE CROSS/BLUE SHIELD | Admitting: Family Medicine

## 2015-09-23 ENCOUNTER — Ambulatory Visit: Payer: BLUE CROSS/BLUE SHIELD | Admitting: Family Medicine

## 2015-09-24 ENCOUNTER — Telehealth: Payer: Self-pay | Admitting: Family Medicine

## 2015-09-24 NOTE — Telephone Encounter (Signed)
charge 

## 2015-09-24 NOTE — Telephone Encounter (Signed)
Patient states she forgot but [facility] to 10/11/2015 and states her My Chart was inactive, I activated patient

## 2015-09-24 NOTE — Telephone Encounter (Signed)
Please check why

## 2015-09-24 NOTE — Telephone Encounter (Signed)
Patient no show 09/23/15 charge or no charge ?  °

## 2015-10-08 NOTE — Telephone Encounter (Signed)
Error

## 2015-10-11 ENCOUNTER — Ambulatory Visit (INDEPENDENT_AMBULATORY_CARE_PROVIDER_SITE_OTHER): Payer: BLUE CROSS/BLUE SHIELD | Admitting: Family Medicine

## 2015-10-11 ENCOUNTER — Other Ambulatory Visit: Payer: Self-pay | Admitting: Family Medicine

## 2015-10-11 ENCOUNTER — Encounter: Payer: Self-pay | Admitting: Family Medicine

## 2015-10-11 VITALS — BP 118/80 | HR 93 | Temp 98.3°F | Ht 68.0 in | Wt 280.0 lb

## 2015-10-11 DIAGNOSIS — E669 Obesity, unspecified: Secondary | ICD-10-CM

## 2015-10-11 DIAGNOSIS — F329 Major depressive disorder, single episode, unspecified: Secondary | ICD-10-CM

## 2015-10-11 DIAGNOSIS — A6000 Herpesviral infection of urogenital system, unspecified: Secondary | ICD-10-CM

## 2015-10-11 DIAGNOSIS — F32A Depression, unspecified: Secondary | ICD-10-CM

## 2015-10-11 DIAGNOSIS — M549 Dorsalgia, unspecified: Secondary | ICD-10-CM

## 2015-10-11 DIAGNOSIS — F419 Anxiety disorder, unspecified: Secondary | ICD-10-CM

## 2015-10-11 DIAGNOSIS — F418 Other specified anxiety disorders: Secondary | ICD-10-CM

## 2015-10-11 MED ORDER — TRAMADOL HCL 50 MG PO TABS: 50.0000 mg | ORAL_TABLET | Freq: Three times a day (TID) | ORAL | Status: DC | PRN

## 2015-10-11 MED ORDER — VALACYCLOVIR HCL 1 G PO TABS: 1000.0000 mg | ORAL_TABLET | Freq: Every day | ORAL | Status: DC

## 2015-10-11 MED ORDER — LIRAGLUTIDE -WEIGHT MANAGEMENT 18 MG/3ML ~~LOC~~ SOPN: 3.0000 mL | PEN_INJECTOR | Freq: Every day | SUBCUTANEOUS | Status: DC

## 2015-10-11 MED ORDER — ALPRAZOLAM 0.25 MG PO TABS: 0.2500 mg | ORAL_TABLET | Freq: Three times a day (TID) | ORAL | Status: DC | PRN

## 2015-10-11 MED ORDER — CYCLOBENZAPRINE HCL 10 MG PO TABS: 10.0000 mg | ORAL_TABLET | Freq: Three times a day (TID) | ORAL | Status: DC | PRN

## 2015-10-11 MED FILL — ALPRAZolam 0.25 MG TABS: 0.25 | 10 days supply | Qty: 30 | Fill #0

## 2015-10-11 MED FILL — CYCLOBENZAPRINE 10 MG TAB: 10 | 10 days supply | Qty: 30 | Fill #0

## 2015-10-11 MED FILL — valACYclovir HCL 1 GM TABS: 1 | 90 days supply | Qty: 90 | Fill #0

## 2015-10-11 NOTE — Progress Notes (Signed)
Pre visit review using our clinic review tool, if applicable. No additional management support is needed unless otherwise documented below in the visit note. 

## 2015-10-11 NOTE — Assessment & Plan Note (Signed)
saxenda --- pt instructed to start with 0.6 mg daily for 1 week then 1.2 mg for 1 week and increase by 0.6 mg weekly until 3.0 mg  Discussed diet and exercise

## 2015-10-11 NOTE — Patient Instructions (Signed)
Liraglutide injection (Weight Management) What is this medicine? LIRAGLUTIDE (LIR a GLOO tide) is used with a reduced calorie diet and exercise to help you lose weight. This medicine may be used for other purposes; ask your health care provider or pharmacist if you have questions. What should I tell my health care provider before I take this medicine? They need to know if you have any of these conditions: -endocrine tumors (MEN 2) or if someone in your family had these tumors -gallstones -high cholesterol -history of alcohol abuse problem -history of pancreatitis -kidney disease or if you are on dialysis -liver disease -previous swelling of the tongue, face, or lips with difficulty breathing, difficulty swallowing, hoarseness, or tightening of the throat -stomach problems -suicidal thoughts, plans, or attempt; a previous suicide attempt by you or a family member -thyroid cancer or if someone in your family had thyroid cancer -an unusual or allergic reaction to liraglutide, medicines, foods, dyes, or preservatives -pregnant or trying to get pregnant -breast-feeding How should I use this medicine? This medicine is for injection under the skin of your upper leg, stomach area, or upper arm. You will be taught how to prepare and give this medicine. Use exactly as directed. Take your medicine at regular intervals. Do not take it more often than directed. It is important that you put your used needles and syringes in a special sharps container. Do not put them in a trash can. If you do not have a sharps container, call your pharmacist or healthcare provider to get one. A special MedGuide will be given to you by the pharmacist with each prescription and refill. Be sure to read this information carefully each time. Talk to your pediatrician regarding the use of this medicine in children. Special care may be needed. Overdosage: If you think you have taken too much of this medicine contact a poison  control center or emergency room at once. NOTE: This medicine is only for you. Do not share this medicine with others. What if I miss a dose? If you miss a dose, take it as soon as you can. If it is almost time for your next dose, take only that dose. Do not take double or extra doses. If you miss your dose for 3 days or more, call your doctor or health care professional to talk about how to restart this medicine. What may interact with this medicine? -acetaminophen -atorvastatin -birth control pills -digoxin -griseofulvin -lisinopril This list may not describe all possible interactions. Give your health care provider a list of all the medicines, herbs, non-prescription drugs, or dietary supplements you use. Also tell them if you smoke, drink alcohol, or use illegal drugs. Some items may interact with your medicine. What should I watch for while using this medicine? Visit your doctor or health care professional for regular checks on your progress. This medicine is intended to be used in addition to a healthy diet and appropriate exercise. The best results are achieved this way. Do not increase or in any way change your dose without consulting your doctor or health care professional. This medicine may affect blood sugar levels. If you have diabetes, check with your doctor or health care professional before you change your diet or the dose of your diabetic medicine. Patients and their families should watch out for worsening depression or thoughts of suicide. Also watch out for sudden changes in feelings such as feeling anxious, agitated, panicky, irritable, hostile, aggressive, impulsive, severely restless, overly excited and hyperactive, or not being   able to sleep. If this happens, especially at the beginning of treatment or after a change in dose, call your health care professional. What side effects may I notice from receiving this medicine? Side effects that you should report to your doctor or  health care professional as soon as possible: -allergic reactions like skin rash, itching or hives, swelling of the face, lips, or tongue -breathing problems -fever, chills -loss of appetite -signs and symptoms of low blood sugar such as feeling anxious, confusion, dizziness, increased hunger, unusually weak or tired, sweating, shakiness, cold, irritable, headache, blurred vision, fast heartbeat, loss of consciousness -trouble passing urine or change in the amount of urine -unusual stomach pain or upset -vomiting Side effects that usually do not require medical attention (Report these to your doctor or health care professional if they continue or are bothersome.): -constipation -diarrhea -fatigue -headache -nausea This list may not describe all possible side effects. Call your doctor for medical advice about side effects. You may report side effects to FDA at 1-800-FDA-1088. Where should I keep my medicine? Keep out of the reach of children. Store unopened pen in a refrigerator between 2 and 8 degrees C (36 and 46 degrees F). Do not freeze or use if the medicine has been frozen. Protect from light and excessive heat. After you first use the pen, it can be stored at room temperature between 15 and 30 degrees C (59 and 86 degrees F) or in a refrigerator. Throw away your used pen after 30 days or after the expiration date, whichever comes first. Do not store your pen with the needle attached. If the needle is left on, medicine may leak from the pen. NOTE: This sheet is a summary. It may not cover all possible information. If you have questions about this medicine, talk to your doctor, pharmacist, or health care provider.    2016, Elsevier/Gold Standard. (2013-10-30 12:29:49)  

## 2015-10-11 NOTE — Progress Notes (Signed)
Patient ID: [name], female    DOB: [birth year]  Age: 35 y.o. MRN: [id number]    Subjective:  Subjective HPI [name] presents to discuss weight loss.  She is interested in saxenda specifically.  She had done herbal life and did well but with holidays she gained it back.     Review of Systems  Constitutional: Negative for diaphoresis, appetite change, fatigue and unexpected weight change.  Eyes: Negative for pain, redness and visual disturbance.  Respiratory: Negative for cough, chest tightness, shortness of breath and wheezing.   Cardiovascular: Negative for chest pain, palpitations and leg swelling.  Endocrine: Negative for cold intolerance, heat intolerance, polydipsia, polyphagia and polyuria.  Genitourinary: Negative for dysuria, frequency and difficulty urinating.  Neurological: Negative for dizziness, light-headedness, numbness and headaches.    History Past Medical History  Diagnosis Date  . Anemia   . Thyroid disease   . Asthma   . Obesity   . Genital herpes     She has past surgical history that includes Keloid excision (2008) and Salivary gland surgery.   Her family history includes Cancer in her maternal grandfather and paternal grandfather; Cancer (age of onset: 25) in her father; Depression in her father; Heart disease (age of onset: 18) in her father; Hyperlipidemia in her father and mother; Hypertension in her father and mother.She reports that she has never smoked. She has never used smokeless tobacco. She reports that she drinks alcohol. She reports that she does not use illicit drugs.  Current Outpatient Prescriptions on File Prior to Visit  Medication Sig Dispense Refill  . albuterol (PROAIR HFA) 108 (90 BASE) MCG/ACT inhaler Inhale 2 puffs into the lungs every 6 (six) hours as needed. 6.7 g 1  . cyanocobalamin (,VITAMIN B-12,) 1000 MCG/ML injection Inject 1 mL (1,000 mcg total) into the skin once a week. Inject weekly for 1 month, then inject  monthly for 3 months. 10 mL 0  . FLUoxetine (PROZAC) 20 MG capsule Take 1 capsule (20 mg total) by mouth daily. 30 capsule 5  . SYRINGE-NEEDLE, DISP, 3 ML (B-D SYRINGE/NEEDLE 3CC/25GX5/8) 25G X 5/8" 3 ML MISC Inject 1 Device into the skin once a week. Use weekly for one month and then monthly for 3 months. 5 each 0  . valACYclovir (VALTREX) 1000 MG tablet Take 1 tablet (1,000 mg total) by mouth 3 (three) times daily. 30 tablet 2  . Vitamin D, Ergocalciferol, (DRISDOL) 50000 UNITS CAPS capsule Take 1 capsule (50,000 Units total) by mouth every 7 (seven) days. 4 capsule 3   No current facility-administered medications on file prior to visit.     Objective:  Objective Physical Exam  Constitutional: She is oriented to person, place, and time. She appears well-developed and well-nourished.  HENT:  Head: Normocephalic and atraumatic.  Eyes: Conjunctivae and EOM are normal.  Neck: Normal range of motion. Neck supple. No JVD present. Carotid bruit is not present. No thyromegaly present.  Cardiovascular: Normal rate, regular rhythm and normal heart sounds.   No murmur heard. Pulmonary/Chest: Effort normal and breath sounds normal. No respiratory distress. She has no wheezes. She has no rales. She exhibits no tenderness.  Musculoskeletal: She exhibits no edema.  Neurological: She is alert and oriented to person, place, and time.  Psychiatric: She has a normal mood and affect.  Nursing note and vitals reviewed.  BP 118/80 mmHg  Pulse 93  Temp(Src) 98.3 F (36.8 C) (Oral)  Ht 5\' 8"  (1.727 m)  Wt 280 lb (127.007  kg)  BMI 42.58 kg/m2  SpO2 98%  LMP 09/17/2015 Wt Readings from Last 3 Encounters:  10/11/15 280 lb (127.007 kg)  05/28/15 281 lb 3.2 oz (127.551 kg)  12/22/14 273 lb (123.832 kg)     Lab Results  Component Value Date   WBC 10.5 10/30/2014   HGB 12.2 10/30/2014   HCT 38.4 10/30/2014   PLT 328.0 10/30/2014   GLUCOSE 89 10/30/2014   CHOL 112 10/30/2014   TRIG 61.0 10/30/2014    HDL 35.10* 10/30/2014   LDLCALC 65 10/30/2014   ALT 19 10/30/2014   AST 22 10/30/2014   NA 137 10/30/2014   K 3.4* 10/30/2014   CL 103 10/30/2014   CREATININE 0.73 10/30/2014   BUN 4* 10/30/2014   CO2 30 10/30/2014   TSH 1.996 05/28/2015    US Soft Tissue Head/neck  06/15/2015  CLINICAL DATA:  35 year old female with a history of thyroid nodules EXAM: THYROID ULTRASOUND TECHNIQUE: Ultrasound examination of the thyroid gland and adjacent soft tissues was performed. COMPARISON:  06/27/2010, 12/21/2009 FINDINGS: Right thyroid lobe Measurements: 5.9 cm x 1.7 cm x 2.4 cm. Small hypoechoic nodules within the right thyroid gland. Largest measures no more than 8 mm. Superior nodule measures 3 mm and inferior nodule measures 5 mm. Left thyroid lobe Measurements: 6.1 cm x 1.6 cm x 1.9 cm. Several hypoechoic nodules of the left thyroid. Largest in the mid thyroid measures 1 cm in greatest diameter with no internal calcifications. The remainder of the left-sided nodules measure less than 1 cm with benign features. Isthmus Thickness: 2 mm.  No nodules visualized. Lymphadenopathy None visualized. IMPRESSION: Multinodular thyroid again noted, similar to the comparison ultrasound survey. None of these meet criteria for biopsy. Follow-up by clinical exam is recommended. If patient has known risk factors for thyroid carcinoma, consider follow-up ultrasound in 12 months. If patient is clinically hyperthyroid, consider nuclear medicine thyroid uptake and scan.Reference: Management of Thyroid Nodules Detected at [place]: Society of Radiologists in Ultrasound Consensus Conference Statement. Radiology 2005; [id number]. Signed, Yvone Neu. Loreta Ave, DO [facility] Radiology Electronically Signed   By: Gilmer Mor D.O.   On: 06/15/2015 08:22     Assessment & Plan:  Plan I am having Ms. Muzio start on Liraglutide -Weight Management. I am also having her maintain her albuterol,  FLUoxetine, Vitamin D (Ergocalciferol), SYRINGE-NEEDLE (DISP) 3 ML, cyanocobalamin, valACYclovir, traMADol, ALPRAZolam, and cyclobenzaprine.  Meds ordered this encounter  Medications  . traMADol (ULTRAM) 50 MG tablet    Sig: Take 1 tablet (50 mg total) by mouth every 8 (eight) hours as needed.    Dispense:  30 tablet    Refill:  0  . ALPRAZolam (XANAX) 0.25 MG tablet    Sig: Take 1 tablet (0.25 mg total) by mouth 3 (three) times daily as needed for sleep.    Dispense:  30 tablet    Refill:  0  . cyclobenzaprine (FLEXERIL) 10 MG tablet    Sig: Take 1 tablet (10 mg total) by mouth 3 (three) times daily as needed for muscle spasms.    Dispense:  30 tablet    Refill:  0  . Liraglutide -Weight Management (SAXENDA) 18 MG/3ML SOPN    Sig: Inject 3 mLs into the skin daily.    Dispense:  3 mL    Refill:  3    Problem List Items Addressed This Visit      Unprioritized   Obesity    saxenda --- pt instructed to start  with 0.6 mg daily for 1 week then 1.2 mg for 1 week and increase by 0.6 mg weekly until 3.0 mg  Discussed diet and exercise      Relevant Medications   Liraglutide -Weight Management (SAXENDA) 18 MG/3ML SOPN    Other Visit Diagnoses    Anxiety and depression    -  Primary    Relevant Medications    ALPRAZolam (XANAX) 0.25 MG tablet    Back pain without radiation        Relevant Medications    traMADol (ULTRAM) 50 MG tablet    cyclobenzaprine (FLEXERIL) 10 MG tablet    Morbid obesity, unspecified obesity type (HCC)        Relevant Medications    Liraglutide -Weight Management (SAXENDA) 18 MG/3ML SOPN       Follow-up: Return in about 4 weeks (around 11/08/2015), or if symptoms worsen or fail to improve.  Loreen Freud, DO

## 2015-10-15 ENCOUNTER — Telehealth: Payer: Self-pay | Admitting: Family Medicine

## 2015-10-15 NOTE — Telephone Encounter (Signed)
Received fax from pharmacy requesting PA for Saxenda, Initiated via Cover My Meds; reports "drug is notcovered bt plan"; also, faxed paper PA form to [employer], awaiting decision/SLS 01/27

## 2015-10-15 NOTE — Telephone Encounter (Signed)
[facility] - [place], [place] - [address] [phone] (Phone) [phone] (Fax)        Reason for call:  Pharmacy checking on the status of PA for Liraglutide -Weight Management (SAXENDA) 18 MG/3ML SOPN. As per pharmacy notified PCP thru covermeds and fax.

## 2015-10-26 ENCOUNTER — Encounter: Payer: Self-pay | Admitting: Family Medicine

## 2015-10-26 NOTE — Telephone Encounter (Signed)
Jasmine December have we received a response from the [facility] company on this PA yet?

## 2015-10-27 NOTE — Telephone Encounter (Signed)
Jasmine December do we have an update on this PA?

## 2015-10-27 NOTE — Telephone Encounter (Signed)
Received information regarding request for Saxenda 18mg /56ml pen-injectors, "Drug is not covered by plan"; please have patient call Insurance for formulary alternatives that provider can px/SLS 02/08 Thanks.

## 2015-10-27 NOTE — Telephone Encounter (Signed)
Regis Bill, CMA at 10/27/2015 9:14 AM     Status: Signed       Expand All Collapse All   Received information regarding request for Saxenda 18mg /65ml pen-injectors, "Drug is not covered by plan"; please have patient call Insurance for formulary alternatives that provider can px/SLS 02/08 Thanks.            Regis Bill, CMA at 10/15/2015 1:37 PM     Status: Signed       Expand All Collapse All   Received fax from pharmacy requesting PA for Saxenda, Initiated via Cover My Meds; reports "drug is notcovered bt plan"; also, faxed paper PA form to [employer], awaiting decision/SLS 01/27

## 2015-10-28 NOTE — Telephone Encounter (Signed)
Was the problem prior auth or they just refused?

## 2015-10-29 ENCOUNTER — Other Ambulatory Visit: Payer: Self-pay | Admitting: Family Medicine

## 2015-10-29 MED ORDER — LORCASERIN HCL 10 MG PO TABS: ORAL_TABLET | ORAL | Status: DC

## 2015-10-29 NOTE — Telephone Encounter (Signed)
i someone to talk to her to figure this out-- can't make sense of the message----

## 2015-10-29 NOTE — Telephone Encounter (Signed)
She is requesting a sample of Saxenda, and she is asking for an alternative, he insurance states if she has failed 2 alternatives they would approved the Saxenda and she has already tried the phentermine. Alternatives are:  dronabinol 5 mg capsule  Qsymia 11.25 Mg-69 Mg Capsule  phendimetrazine tartrate 35 mg tablet, 105 mg extended release  phentermine 37.5 mg tablet, 30mg , 15mg   Belviq 10 Mg Tablet  Contrave 8 Mg-90 Mg Tablet, Extended Release  dronabinol 10 mg capsule, 5mg , 2.5mg 

## 2015-11-01 ENCOUNTER — Telehealth: Payer: Self-pay | Admitting: *Deleted

## 2015-11-01 NOTE — Telephone Encounter (Signed)
Received PA for Zomig; CMM key [id number] 02/13

## 2015-11-02 NOTE — Telephone Encounter (Signed)
Received fax request for PA on Belviq, CMM key [id number] 02/14

## 2015-11-03 NOTE — Telephone Encounter (Signed)
Per covermymeds, Belviq is not covered by insurance. Further information given to PCP to complete.

## 2015-11-05 NOTE — Telephone Encounter (Signed)
We may have a sample of saxenda she can have

## 2015-11-09 NOTE — Telephone Encounter (Signed)
Encounter Messages  Expand AllCollapse All     RE: RE: Non-Urgent Medical Question     From   Frayda D Westervelt    To   Lelon Perla, DO    Sent   11/05/2015 2:44 PM       So after digging a little deeper apparently my "wonderful" expensive insurance company no longer covers any antiobesity medication. So I will just have to develop moribidities so they will cover gastric bypass.... I feel so defeated, thank you all for your assistance. I was on the phone with for an hour trying to figure out what they covered to find out nothing is covered.Marland KitchenMarland Kitchen

## 2015-11-15 ENCOUNTER — Ambulatory Visit (INDEPENDENT_AMBULATORY_CARE_PROVIDER_SITE_OTHER): Payer: BLUE CROSS/BLUE SHIELD | Admitting: Family Medicine

## 2015-11-15 ENCOUNTER — Encounter: Payer: Self-pay | Admitting: Family Medicine

## 2015-11-15 DIAGNOSIS — E669 Obesity, unspecified: Secondary | ICD-10-CM | POA: Diagnosis not present

## 2015-11-15 NOTE — Assessment & Plan Note (Signed)
con't diet and exercise Pt was doing well with saxenda.  She ran out and her ins would not pay for it---- she gained most of it back.   D/w pt importance of sticking with diet and exercise. Will see if we can get saxenda from co

## 2015-11-15 NOTE — Progress Notes (Signed)
Patient ID: [name], female    DOB: 22-Sep-1980  Age: 35 y.o. MRN: [id number]    Subjective:  Subjective HPI MAMMIE MERAS presents for f/u weight loss.  She is doing well with saxenda but her ins will not pay for it.    Review of Systems  Constitutional: Negative for diaphoresis, appetite change, fatigue and unexpected weight change.  Eyes: Negative for pain, redness and visual disturbance.  Respiratory: Negative for cough, chest tightness, shortness of breath and wheezing.   Cardiovascular: Negative for chest pain, palpitations and leg swelling.  Endocrine: Negative for cold intolerance, heat intolerance, polydipsia, polyphagia and polyuria.  Genitourinary: Negative for dysuria, frequency and difficulty urinating.  Neurological: Negative for dizziness, light-headedness, numbness and headaches.    History Past Medical History  Diagnosis Date  . Anemia   . Thyroid disease   . Asthma   . Obesity   . Genital herpes     She has past surgical history that includes Keloid excision (2008) and Salivary gland surgery.   Her family history includes Cancer in her maternal grandfather and paternal grandfather; Cancer (age of onset: 16) in her father; Depression in her father; Heart disease (age of onset: 75) in her father; Hyperlipidemia in her father and mother; Hypertension in her father and mother.She reports that she has never smoked. She has never used smokeless tobacco. She reports that she drinks alcohol. She reports that she does not use illicit drugs.  Current Outpatient Prescriptions on File Prior to Visit  Medication Sig Dispense Refill  . albuterol (PROAIR HFA) 108 (90 BASE) MCG/ACT inhaler Inhale 2 puffs into the lungs every 6 (six) hours as needed. 6.7 g 1  . ALPRAZolam (XANAX) 0.25 MG tablet Take 1 tablet (0.25 mg total) by mouth 3 (three) times daily as needed for sleep. 30 tablet 0  . cyanocobalamin (,VITAMIN B-12,) 1000 MCG/ML injection Inject 1 mL (1,000 mcg  total) into the skin once a week. Inject weekly for 1 month, then inject monthly for 3 months. 10 mL 0  . cyclobenzaprine (FLEXERIL) 10 MG tablet Take 1 tablet (10 mg total) by mouth 3 (three) times daily as needed for muscle spasms. 30 tablet 0  . FLUoxetine (PROZAC) 20 MG capsule Take 1 capsule (20 mg total) by mouth daily. 30 capsule 5  . Liraglutide -Weight Management (SAXENDA) 18 MG/3ML SOPN Inject 3 mLs into the skin daily. 3 mL 3  . Lorcaserin HCl (BELVIQ) 10 MG TABS 1 po bid 60 tablet 0  . SYRINGE-NEEDLE, DISP, 3 ML (B-D SYRINGE/NEEDLE 3CC/25GX5/8) 25G X 5/8" 3 ML MISC Inject 1 Device into the skin once a week. Use weekly for one month and then monthly for 3 months. 5 each 0  . traMADol (ULTRAM) 50 MG tablet Take 1 tablet (50 mg total) by mouth every 8 (eight) hours as needed. 30 tablet 0  . valACYclovir (VALTREX) 1000 MG tablet Take 1 tablet (1,000 mg total) by mouth 3 (three) times daily. 30 tablet 2  . valACYclovir (VALTREX) 1000 MG tablet Take 1 tablet (1,000 mg total) by mouth daily. 90 tablet 3  . Vitamin D, Ergocalciferol, (DRISDOL) 50000 UNITS CAPS capsule Take 1 capsule (50,000 Units total) by mouth every 7 (seven) days. 4 capsule 3   No current facility-administered medications on file prior to visit.     Objective:  Objective Physical Exam  Constitutional: She is oriented to person, place, and time. She appears well-developed and well-nourished.  HENT:  Head: Normocephalic and atraumatic.  Eyes: Conjunctivae and EOM are normal.  Neck: Normal range of motion. Neck supple. No JVD present. Carotid bruit is not present. No thyromegaly present.  Cardiovascular: Normal rate, regular rhythm and normal heart sounds.   No murmur heard. Pulmonary/Chest: Effort normal and breath sounds normal. No respiratory distress. She has no wheezes. She has no rales. She exhibits no tenderness.  Musculoskeletal: She exhibits no edema.  Neurological: She is alert and oriented to person, place,  and time.  Psychiatric: She has a normal mood and affect.  Nursing note reviewed.  BP 132/76 mmHg  Pulse 76  Temp(Src) 98.9 F (37.2 C) (Oral)  Ht 5\' 8"  (1.727 m)  Wt 277 lb 3.2 oz (125.737 kg)  BMI 42.16 kg/m2  SpO2 98% Wt Readings from Last 3 Encounters:  11/15/15 277 lb 3.2 oz (125.737 kg)  10/11/15 280 lb (127.007 kg)  05/28/15 281 lb 3.2 oz (127.551 kg)     Lab Results  Component Value Date   WBC 10.5 10/30/2014   HGB 12.2 10/30/2014   HCT 38.4 10/30/2014   PLT 328.0 10/30/2014   GLUCOSE 89 10/30/2014   CHOL 112 10/30/2014   TRIG 61.0 10/30/2014   HDL 35.10* 10/30/2014   LDLCALC 65 10/30/2014   ALT 19 10/30/2014   AST 22 10/30/2014   NA 137 10/30/2014   K 3.4* 10/30/2014   CL 103 10/30/2014   CREATININE 0.73 10/30/2014   BUN 4* 10/30/2014   CO2 30 10/30/2014   TSH 1.996 05/28/2015    US Soft Tissue Head/neck  06/15/2015  CLINICAL DATA:  35 year old female with a history of thyroid nodules EXAM: THYROID ULTRASOUND TECHNIQUE: Ultrasound examination of the thyroid gland and adjacent soft tissues was performed. COMPARISON:  06/27/2010, 12/21/2009 FINDINGS: Right thyroid lobe Measurements: 5.9 cm x 1.7 cm x 2.4 cm. Small hypoechoic nodules within the right thyroid gland. Largest measures no more than 8 mm. Superior nodule measures 3 mm and inferior nodule measures 5 mm. Left thyroid lobe Measurements: 6.1 cm x 1.6 cm x 1.9 cm. Several hypoechoic nodules of the left thyroid. Largest in the mid thyroid measures 1 cm in greatest diameter with no internal calcifications. The remainder of the left-sided nodules measure less than 1 cm with benign features. Isthmus Thickness: 2 mm.  No nodules visualized. Lymphadenopathy None visualized. IMPRESSION: Multinodular thyroid again noted, similar to the comparison ultrasound survey. None of these meet criteria for biopsy. Follow-up by clinical exam is recommended. If patient has known risk factors for thyroid carcinoma, consider  follow-up ultrasound in 12 months. If patient is clinically hyperthyroid, consider nuclear medicine thyroid uptake and scan.Reference: Management of Thyroid Nodules Detected at [place]: Society of Radiologists in Ultrasound Consensus Conference Statement. Radiology 2005; [id number]. Signed, Yvone Neu. Loreta Ave, DO [facility] Electronically Signed   By: Gilmer Mor D.O.   On: 06/15/2015 08:22     Assessment & Plan:  Plan I am having Ms. Claytor maintain her albuterol, FLUoxetine, Vitamin D (Ergocalciferol), SYRINGE-NEEDLE (DISP) 3 ML, cyanocobalamin, valACYclovir, traMADol, ALPRAZolam, cyclobenzaprine, Liraglutide -Weight Management, valACYclovir, and Lorcaserin HCl.  No orders of the defined types were placed in this encounter.    Problem List Items Addressed This Visit      Unprioritized   Obesity    con't diet and exercise Pt was doing well with saxenda.  She ran out and her ins would not pay for it---- she gained most of it back.   D/w pt importance of sticking with diet and  exercise. Will see if we can get saxenda from co       Other Visit Diagnoses    Morbid obesity due to excess calories (HCC)    -  Primary       Follow-up: Return in about 3 months (around 02/12/2016), or if symptoms worsen or fail to improve.  Loreen Freud, DO

## 2015-11-15 NOTE — Progress Notes (Signed)
Pre visit review using our clinic review tool, if applicable. No additional management support is needed unless otherwise documented below in the visit note. 

## 2015-11-15 NOTE — Patient Instructions (Signed)

## 2015-11-22 ENCOUNTER — Encounter: Payer: Self-pay | Admitting: Family Medicine

## 2015-11-22 NOTE — Telephone Encounter (Signed)
[name]-- would you ask the rep if there is any assistance for someone whose ins will not cover it at all?

## 2015-11-29 ENCOUNTER — Encounter: Payer: Self-pay | Admitting: Family Medicine

## 2015-11-29 NOTE — Telephone Encounter (Signed)
The [facility] rep as well as the patient employer said you can write an appeal letter to try to get the insurance company to cover the medication. You would need to include what she has taken previous and why [name] is recommended. Please advise    KP

## 2015-11-30 ENCOUNTER — Encounter: Payer: Self-pay | Admitting: Family Medicine

## 2015-12-01 NOTE — Telephone Encounter (Signed)
At provider's request, called [employer], transferred from Rx Svcs to Member Svcs and finally Provider Svcs, spoke with Terry RE: Administrative Appeal on denial for Saxenda, given fax number [phone]; sent fax cover sheet and provider's letter with successful confirmation/SLS 03/15 

## 2015-12-01 NOTE — Telephone Encounter (Signed)
At provider's request, called [employer], transferred from Rx Svcs to Member Svcs and finally Provider Svcs, spoke with Aurther Loft RE: Administrative Appeal on denial for [place], given fax number [phone]; sent fax cover sheet and provider's letter with successful confirmation/SLS 03/15

## 2015-12-03 NOTE — Telephone Encounter (Signed)
Received Denial again on Appeal letter from [employer]; the only way to appeal now is if Provider chooses to "contact the patient's plan via the insurance company by calling the customer service number provided on the back of the patient's insurance card. [employer] does not handle this appeal." [employer] case ID [id number]/SLS 03/17

## 2015-12-09 NOTE — Telephone Encounter (Signed)
I don't know what else we can do.

## 2015-12-09 NOTE — Telephone Encounter (Addendum)
See Sharon's note. You may have to call.   KP

## 2015-12-22 ENCOUNTER — Encounter: Payer: Self-pay | Admitting: Family Medicine

## 2015-12-23 ENCOUNTER — Other Ambulatory Visit: Payer: Self-pay | Admitting: Family Medicine

## 2015-12-23 DIAGNOSIS — F32A Depression, unspecified: Secondary | ICD-10-CM

## 2015-12-23 DIAGNOSIS — F329 Major depressive disorder, single episode, unspecified: Secondary | ICD-10-CM

## 2015-12-23 DIAGNOSIS — F419 Anxiety disorder, unspecified: Secondary | ICD-10-CM

## 2015-12-23 MED ORDER — ALPRAZOLAM 0.25 MG PO TABS: 0.2500 mg | ORAL_TABLET | Freq: Three times a day (TID) | ORAL | Status: DC | PRN

## 2015-12-24 ENCOUNTER — Encounter: Payer: BLUE CROSS/BLUE SHIELD | Admitting: Family Medicine

## 2016-01-18 MED FILL — valACYclovir HCL 1 GM TABS: 1 | 90 days supply | Qty: 90 | Fill #1

## 2016-01-18 MED FILL — ALPRAZolam 0.25 MG TABS: 0.25 | 10 days supply | Qty: 30 | Fill #0

## 2016-03-02 ENCOUNTER — Ambulatory Visit (INDEPENDENT_AMBULATORY_CARE_PROVIDER_SITE_OTHER): Payer: BLUE CROSS/BLUE SHIELD | Admitting: Family Medicine

## 2016-03-02 ENCOUNTER — Encounter: Payer: Self-pay | Admitting: Family Medicine

## 2016-03-02 VITALS — BP 120/78 | HR 77 | Temp 99.0°F | Ht 68.0 in | Wt 277.2 lb

## 2016-03-02 DIAGNOSIS — Z0000 Encounter for general adult medical examination without abnormal findings: Secondary | ICD-10-CM | POA: Diagnosis not present

## 2016-03-02 DIAGNOSIS — F418 Other specified anxiety disorders: Secondary | ICD-10-CM

## 2016-03-02 DIAGNOSIS — E559 Vitamin D deficiency, unspecified: Secondary | ICD-10-CM

## 2016-03-02 DIAGNOSIS — F329 Major depressive disorder, single episode, unspecified: Secondary | ICD-10-CM

## 2016-03-02 DIAGNOSIS — J4521 Mild intermittent asthma with (acute) exacerbation: Secondary | ICD-10-CM | POA: Diagnosis not present

## 2016-03-02 DIAGNOSIS — Z01419 Encounter for gynecological examination (general) (routine) without abnormal findings: Secondary | ICD-10-CM

## 2016-03-02 DIAGNOSIS — F419 Anxiety disorder, unspecified: Secondary | ICD-10-CM

## 2016-03-02 DIAGNOSIS — F32A Depression, unspecified: Secondary | ICD-10-CM

## 2016-03-02 DIAGNOSIS — A6000 Herpesviral infection of urogenital system, unspecified: Secondary | ICD-10-CM | POA: Diagnosis not present

## 2016-03-02 DIAGNOSIS — M549 Dorsalgia, unspecified: Secondary | ICD-10-CM | POA: Diagnosis not present

## 2016-03-02 MED ORDER — CYCLOBENZAPRINE HCL 10 MG PO TABS
10.0000 mg | ORAL_TABLET | Freq: Three times a day (TID) | ORAL | Status: DC | PRN
Start: 2016-03-02 — End: 2017-04-11

## 2016-03-02 MED ORDER — TRAMADOL HCL 50 MG PO TABS: 50.0000 mg | ORAL_TABLET | Freq: Three times a day (TID) | ORAL | Status: DC | PRN

## 2016-03-02 MED ORDER — FLUOXETINE HCL 20 MG PO CAPS: 20.0000 mg | ORAL_CAPSULE | Freq: Every day | ORAL | Status: DC

## 2016-03-02 MED ORDER — ALBUTEROL SULFATE HFA 108 (90 BASE) MCG/ACT IN AERS: 2.0000 | INHALATION_SPRAY | Freq: Four times a day (QID) | RESPIRATORY_TRACT | Status: AC | PRN

## 2016-03-02 MED ORDER — VALACYCLOVIR HCL 1 G PO TABS
1000.0000 mg | ORAL_TABLET | Freq: Every day | ORAL | Status: DC
Start: 2016-03-02 — End: 2017-04-05

## 2016-03-02 MED ORDER — ALPRAZOLAM 0.25 MG PO TABS: 0.2500 mg | ORAL_TABLET | Freq: Three times a day (TID) | ORAL | Status: DC | PRN

## 2016-03-02 MED ORDER — VITAMIN D (ERGOCALCIFEROL) 1.25 MG (50000 UNIT) PO CAPS
50000.0000 [IU] | ORAL_CAPSULE | ORAL | Status: DC
Start: 2016-03-02 — End: 2017-01-05

## 2016-03-02 NOTE — Progress Notes (Signed)
Subjective:    Patient ID: [name], female    DOB: 06-Oct-1980, 35 y.o.   MRN: [id number]  HPI    Review of Systems     Objective:   Physical Exam        Assessment & Plan:   Subjective:     [name] is a 35 y.o. female and is here for a comprehensive physical exam. The patient reports no problems.  Social History   Social History  . Marital Status: Single    Spouse Name: N/A  . Number of Children: N/A  . Years of Education: N/A   Occupational History  . [employer]-- teach biology    Social History Main Topics  . Smoking status: Never Smoker   . Smokeless tobacco: Never Used  . Alcohol Use: Yes  . Drug Use: No  . Sexual Activity:    Partners: Male   Other Topics Concern  . Not on file   Social History Narrative   Health Maintenance  Topic Date Due  . PAP SMEAR  07/20/2015  . INFLUENZA VACCINE  11/14/2016 (Originally 04/18/2016)  . TETANUS/TDAP  11/10/2020  . HIV Screening  Completed    The following portions of the patient's history were reviewed and updated as appropriate:  She  has a past medical history of Anemia; Thyroid disease; Asthma; Obesity; and Genital herpes. She  does not have any pertinent problems on file. She  has past surgical history that includes Keloid excision (2008) and Salivary gland surgery. Her family history includes Cancer in her maternal grandfather and paternal grandfather; Cancer (age of onset: 25) in her father; Depression in her father; Heart disease (age of onset: 71) in her father; Hyperlipidemia in her father and mother; Hypertension in her father and mother. She  reports that she has never smoked. She has never used smokeless tobacco. She reports that she drinks alcohol. She reports that she does not use illicit drugs. She has a current medication list which includes the following prescription(s): albuterol, alprazolam, cyanocobalamin, cyclobenzaprine, syringe-needle (disp) 3 ml, tramadol, valacyclovir,  vitamin d (ergocalciferol), and fluoxetine. Current Outpatient Prescriptions on File Prior to Visit  Medication Sig Dispense Refill  . cyanocobalamin (,VITAMIN B-12,) 1000 MCG/ML injection Inject 1 mL (1,000 mcg total) into the skin once a week. Inject weekly for 1 month, then inject monthly for 3 months. 10 mL 0  . SYRINGE-NEEDLE, DISP, 3 ML (B-D SYRINGE/NEEDLE 3CC/25GX5/8) 25G X 5/8" 3 ML MISC Inject 1 Device into the skin once a week. Use weekly for one month and then monthly for 3 months. 5 each 0   No current facility-administered medications on file prior to visit.   She has No Known Allergies..  Review of Systems Review of Systems  Constitutional: Negative for activity change, appetite change and fatigue.  HENT: Negative for hearing loss, congestion, tinnitus and ear discharge.  dentist q9m Eyes: Negative for visual disturbance (see optho q1y -- vision corrected to 20/20 with glasses).  Respiratory: Negative for cough, chest tightness and shortness of breath.   Cardiovascular: Negative for chest pain, palpitations and leg swelling.  Gastrointestinal: Negative for abdominal pain, diarrhea, constipation and abdominal distention.  Genitourinary: Negative for urgency, frequency, decreased urine volume and difficulty urinating.  Musculoskeletal: Negative for back pain, arthralgias and gait problem.  Skin: Negative for color change, pallor and rash.  Neurological: Negative for dizziness, light-headedness, numbness and headaches.  Hematological: Negative for adenopathy. Does not bruise/bleed easily.  Psychiatric/Behavioral: Negative for suicidal ideas, confusion,  sleep disturbance, self-injury, dysphoric mood, decreased concentration and agitation.       Objective:    BP 120/78 mmHg  Pulse 77  Temp(Src) 99 F (37.2 C) (Oral)  Ht  (1.727 m)  Wt 277 lb 3.2 oz (125.737 kg)  BMI 42.16 kg/m2  SpO2 98% General appearance: alert, cooperative, appears stated age and no  distress Head: Normocephalic, without obvious abnormality, atraumatic Eyes: conjunctivae/corneas clear. PERRL, EOM's intact. Fundi benign. Ears: normal TM's and external ear canals both ears Nose: Nares normal. Septum midline. Mucosa normal. No drainage or sinus tenderness. Throat: lips, mucosa, and tongue normal; teeth and gums normal Neck: no adenopathy, no carotid bruit, no JVD, supple, symmetrical, trachea midline and thyroid not enlarged, symmetric, no tenderness/mass/nodules Back: symmetric, no curvature. ROM normal. No CVA tenderness. Lungs: clear to auscultation bilaterally Breasts: gyn Heart: regular rate and rhythm, S1, S2 normal, no murmur, click, rub or gallop Abdomen: soft, non-tender; bowel sounds normal; no masses,  no organomegaly Pelvic: deferred--gyn Extremities: extremities normal, atraumatic, no cyanosis or edema Pulses: 2+ and symmetric Skin: Skin color, texture, turgor normal. No rashes or lesions Lymph nodes: Cervical, supraclavicular, and axillary nodes normal. Neurologic: Alert and oriented X 3, normal strength and tone. Normal symmetric reflexes. Normal coordination and gait    Assessment:    Healthy female exam.      Plan:    ghm utd Check labs See After Visit Summary for Counseling Recommendations   .1. Asthma with acute exacerbation, mild intermittent   - albuterol (PROAIR HFA) 108 (90 Base) MCG/ACT inhaler; Inhale 2 puffs into the lungs every 6 (six) hours as needed.  Dispense: 6.7 g; Refill: 1 - TSH - PSA - CBC with Differential/Platelet - POCT urinalysis dipstick - Comprehensive metabolic panel - Lipid panel  2. Anxiety and depression stable - ALPRAZolam (XANAX) 0.25 MG tablet; Take 1 tablet (0.25 mg total) by mouth 3 (three) times daily as needed for sleep.  Dispense: 30 tablet; Refill: 0 - TSH - PSA - CBC with Differential/Platelet - POCT urinalysis dipstick - Comprehensive metabolic panel - Lipid panel  3. Back pain without  radiation  - cyclobenzaprine (FLEXERIL) 10 MG tablet; Take 1 tablet (10 mg total) by mouth 3 (three) times daily as needed for muscle spasms.  Dispense: 30 tablet; Refill: 0 - traMADol (ULTRAM) 50 MG tablet; Take 1 tablet (50 mg total) by mouth every 8 (eight) hours as needed.  Dispense: 30 tablet; Refill: 0  4. Genital herpes   - valACYclovir (VALTREX) 1000 MG tablet; Take 1 tablet (1,000 mg total) by mouth daily.  Dispense: 90 tablet; Refill: 3  5. Visit for routine gyn exam   - Ambulatory referral to Gynecology  6. Vitamin D deficiency   - Vitamin D, Ergocalciferol, (DRISDOL) 50000 units CAPS capsule; Take 1 capsule (50,000 Units total) by mouth every 7 (seven) days.  Dispense: 4 capsule; Refill: 3  7. Preventative health care  See above - TSH - PSA - CBC with Differential/Platelet - POCT urinalysis dipstick - Comprehensive metabolic panel - Lipid panel - TSH; Future - POCT urinalysis dipstick; Future - Lipid panel; Future - CBC with Differential/Platelet; Future - Comprehensive metabolic panel; Future

## 2016-03-02 NOTE — Patient Instructions (Signed)
Preventive Care for Adults, Female A healthy lifestyle and preventive care can promote health and wellness. Preventive health guidelines for women include the following key practices.  A routine yearly physical is a good way to check with your health care provider about your health and preventive screening. It is a chance to share any concerns and updates on your health and to receive a thorough exam.  Visit your dentist for a routine exam and preventive care every 6 months. Brush your teeth twice a day and floss once a day. Good oral hygiene prevents tooth decay and gum disease.  The frequency of eye exams is based on your age, health, family medical history, use of contact lenses, and other factors. Follow your health care provider's recommendations for frequency of eye exams.  Eat a healthy diet. Foods like vegetables, fruits, whole grains, low-fat dairy products, and lean protein foods contain the nutrients you need without too many calories. Decrease your intake of foods high in solid fats, added sugars, and salt. Eat the right amount of calories for you.Get information about a proper diet from your health care provider, if necessary.  Regular physical exercise is one of the most important things you can do for your health. Most adults should get at least 150 minutes of moderate-intensity exercise (any activity that increases your heart rate and causes you to sweat) each week. In addition, most adults need muscle-strengthening exercises on 2 or more days a week.  Maintain a healthy weight. The body mass index (BMI) is a screening tool to identify possible weight problems. It provides an estimate of body fat based on height and weight. Your health care provider can find your BMI and can help you achieve or maintain a healthy weight.For adults 20 years and older:  A BMI below 18.5 is considered underweight.  A BMI of 18.5 to 24.9 is normal.  A BMI of 25 to 29.9 is considered overweight.  A  BMI of 30 and above is considered obese.  Maintain normal blood lipids and cholesterol levels by exercising and minimizing your intake of saturated fat. Eat a balanced diet with plenty of fruit and vegetables. Blood tests for lipids and cholesterol should begin at age 45 and be repeated every 5 years. If your lipid or cholesterol levels are high, you are over 50, or you are at high risk for heart disease, you may need your cholesterol levels checked more frequently.Ongoing high lipid and cholesterol levels should be treated with medicines if diet and exercise are not working.  If you smoke, find out from your health care provider how to quit. If you do not use tobacco, do not start.  Lung cancer screening is recommended for adults aged 45-80 years who are at high risk for developing lung cancer because of a history of smoking. A yearly low-dose CT scan of the lungs is recommended for people who have at least a 30-pack-year history of smoking and are a current smoker or have quit within the past 15 years. A pack year of smoking is smoking an average of 1 pack of cigarettes a day for 1 year (for example: 1 pack a day for 30 years or 2 packs a day for 15 years). Yearly screening should continue until the smoker has stopped smoking for at least 15 years. Yearly screening should be stopped for people who develop a health problem that would prevent them from having lung cancer treatment.  If you are pregnant, do not drink alcohol. If you are  breastfeeding, be very cautious about drinking alcohol. If you are not pregnant and choose to drink alcohol, do not have more than 1 drink per day. One drink is considered to be 12 ounces (355 mL) of beer, 5 ounces (148 mL) of wine, or 1.5 ounces (44 mL) of liquor.  Avoid use of street drugs. Do not share needles with anyone. Ask for help if you need support or instructions about stopping the use of drugs.  High blood pressure causes heart disease and increases the risk  of stroke. Your blood pressure should be checked at least every 1 to 2 years. Ongoing high blood pressure should be treated with medicines if weight loss and exercise do not work.  If you are 55-79 years old, ask your health care provider if you should take aspirin to prevent strokes.  Diabetes screening is done by taking a blood sample to check your blood glucose level after you have not eaten for a certain period of time (fasting). If you are not overweight and you do not have risk factors for diabetes, you should be screened once every 3 years starting at age 45. If you are overweight or obese and you are 40-70 years of age, you should be screened for diabetes every year as part of your cardiovascular risk assessment.  Breast cancer screening is essential preventive care for women. You should practice "breast self-awareness." This means understanding the normal appearance and feel of your breasts and may include breast self-examination. Any changes detected, no matter how small, should be reported to a health care provider. Women in their 20s and 30s should have a clinical breast exam (CBE) by a health care provider as part of a regular health exam every 1 to 3 years. After age 40, women should have a CBE every year. Starting at age 40, women should consider having a mammogram (breast X-ray test) every year. Women who have a family history of breast cancer should talk to their health care provider about genetic screening. Women at a high risk of breast cancer should talk to their health care providers about having an MRI and a mammogram every year.  Breast cancer gene (BRCA)-related cancer risk assessment is recommended for women who have family members with BRCA-related cancers. BRCA-related cancers include breast, ovarian, tubal, and peritoneal cancers. Having family members with these cancers may be associated with an increased risk for harmful changes (mutations) in the breast cancer genes BRCA1 and  BRCA2. Results of the assessment will determine the need for genetic counseling and BRCA1 and BRCA2 testing.  Your health care provider may recommend that you be screened regularly for cancer of the pelvic organs (ovaries, uterus, and vagina). This screening involves a pelvic examination, including checking for microscopic changes to the surface of your cervix (Pap test). You may be encouraged to have this screening done every 3 years, beginning at age 21.  For women ages 30-65, health care providers may recommend pelvic exams and Pap testing every 3 years, or they may recommend the Pap and pelvic exam, combined with testing for human papilloma virus (HPV), every 5 years. Some types of HPV increase your risk of cervical cancer. Testing for HPV may also be done on women of any age with unclear Pap test results.  Other health care providers may not recommend any screening for nonpregnant women who are considered low risk for pelvic cancer and who do not have symptoms. Ask your health care provider if a screening pelvic exam is right for   you.  If you have had past treatment for cervical cancer or a condition that could lead to cancer, you need Pap tests and screening for cancer for at least 20 years after your treatment. If Pap tests have been discontinued, your risk factors (such as having a new sexual partner) need to be reassessed to determine if screening should resume. Some women have medical problems that increase the chance of getting cervical cancer. In these cases, your health care provider may recommend more frequent screening and Pap tests.  Colorectal cancer can be detected and often prevented. Most routine colorectal cancer screening begins at the age of 50 years and continues through age 75 years. However, your health care provider may recommend screening at an earlier age if you have risk factors for colon cancer. On a yearly basis, your health care provider may provide home test kits to check  for hidden blood in the stool. Use of a small camera at the end of a tube, to directly examine the colon (sigmoidoscopy or colonoscopy), can detect the earliest forms of colorectal cancer. Talk to your health care provider about this at age 50, when routine screening begins. Direct exam of the colon should be repeated every 5-10 years through age 75 years, unless early forms of precancerous polyps or small growths are found.  People who are at an increased risk for hepatitis B should be screened for this virus. You are considered at high risk for hepatitis B if:  You were born in a country where hepatitis B occurs often. Talk with your health care provider about which countries are considered high risk.  Your parents were born in a high-risk country and you have not received a shot to protect against hepatitis B (hepatitis B vaccine).  You have HIV or AIDS.  You use needles to inject street drugs.  You live with, or have sex with, someone who has hepatitis B.  You get hemodialysis treatment.  You take certain medicines for conditions like cancer, organ transplantation, and autoimmune conditions.  Hepatitis C blood testing is recommended for all people born from 1945 through 1965 and any individual with known risks for hepatitis C.  Practice safe sex. Use condoms and avoid high-risk sexual practices to reduce the spread of sexually transmitted infections (STIs). STIs include gonorrhea, chlamydia, syphilis, trichomonas, herpes, HPV, and human immunodeficiency virus (HIV). Herpes, HIV, and HPV are viral illnesses that have no cure. They can result in disability, cancer, and death.  You should be screened for sexually transmitted illnesses (STIs) including gonorrhea and chlamydia if:  You are sexually active and are younger than 24 years.  You are older than 24 years and your health care provider tells you that you are at risk for this type of infection.  Your sexual activity has changed  since you were last screened and you are at an increased risk for chlamydia or gonorrhea. Ask your health care provider if you are at risk.  If you are at risk of being infected with HIV, it is recommended that you take a prescription medicine daily to prevent HIV infection. This is called preexposure prophylaxis (PrEP). You are considered at risk if:  You are sexually active and do not regularly use condoms or know the HIV status of your partner(s).  You take drugs by injection.  You are sexually active with a partner who has HIV.  Talk with your health care provider about whether you are at high risk of being infected with HIV. If   you choose to begin PrEP, you should first be tested for HIV. You should then be tested every 3 months for as long as you are taking PrEP.  Osteoporosis is a disease in which the bones lose minerals and strength with aging. This can result in serious bone fractures or breaks. The risk of osteoporosis can be identified using a bone density scan. Women ages 67 years and over and women at risk for fractures or osteoporosis should discuss screening with their health care providers. Ask your health care provider whether you should take a calcium supplement or vitamin D to reduce the rate of osteoporosis.  Menopause can be associated with physical symptoms and risks. Hormone replacement therapy is available to decrease symptoms and risks. You should talk to your health care provider about whether hormone replacement therapy is right for you.  Use sunscreen. Apply sunscreen liberally and repeatedly throughout the day. You should seek shade when your shadow is shorter than you. Protect yourself by wearing long sleeves, pants, a wide-brimmed hat, and sunglasses year round, whenever you are outdoors.  Once a month, do a whole body skin exam, using a mirror to look at the skin on your back. Tell your health care provider of new moles, moles that have irregular borders, moles that  are larger than a pencil eraser, or moles that have changed in shape or color.  Stay current with required vaccines (immunizations).  Influenza vaccine. All adults should be immunized every year.  Tetanus, diphtheria, and acellular pertussis (Td, Tdap) vaccine. Pregnant women should receive 1 dose of Tdap vaccine during each pregnancy. The dose should be obtained regardless of the length of time since the last dose. Immunization is preferred during the 27th-36th week of gestation. An adult who has not previously received Tdap or who does not know her vaccine status should receive 1 dose of Tdap. This initial dose should be followed by tetanus and diphtheria toxoids (Td) booster doses every 10 years. Adults with an unknown or incomplete history of completing a 3-dose immunization series with Td-containing vaccines should begin or complete a primary immunization series including a Tdap dose. Adults should receive a Td booster every 10 years.  Varicella vaccine. An adult without evidence of immunity to varicella should receive 2 doses or a second dose if she has previously received 1 dose. Pregnant females who do not have evidence of immunity should receive the first dose after pregnancy. This first dose should be obtained before leaving the health care facility. The second dose should be obtained 4-8 weeks after the first dose.  Human papillomavirus (HPV) vaccine. Females aged 13-26 years who have not received the vaccine previously should obtain the 3-dose series. The vaccine is not recommended for use in pregnant females. However, pregnancy testing is not needed before receiving a dose. If a female is found to be pregnant after receiving a dose, no treatment is needed. In that case, the remaining doses should be delayed until after the pregnancy. Immunization is recommended for any person with an immunocompromised condition through the age of 61 years if she did not get any or all doses earlier. During the  3-dose series, the second dose should be obtained 4-8 weeks after the first dose. The third dose should be obtained 24 weeks after the first dose and 16 weeks after the second dose.  Zoster vaccine. One dose is recommended for adults aged 30 years or older unless certain conditions are present.  Measles, mumps, and rubella (MMR) vaccine. Adults born  before 1957 generally are considered immune to measles and mumps. Adults born in 1957 or later should have 1 or more doses of MMR vaccine unless there is a contraindication to the vaccine or there is laboratory evidence of immunity to each of the three diseases. A routine second dose of MMR vaccine should be obtained at least 28 days after the first dose for students attending postsecondary schools, health care workers, or international travelers. People who received inactivated measles vaccine or an unknown type of measles vaccine during 1963-1967 should receive 2 doses of MMR vaccine. People who received inactivated mumps vaccine or an unknown type of mumps vaccine before 1979 and are at high risk for mumps infection should consider immunization with 2 doses of MMR vaccine. For females of childbearing age, rubella immunity should be determined. If there is no evidence of immunity, females who are not pregnant should be vaccinated. If there is no evidence of immunity, females who are pregnant should delay immunization until after pregnancy. Unvaccinated health care workers born before 1957 who lack laboratory evidence of measles, mumps, or rubella immunity or laboratory confirmation of disease should consider measles and mumps immunization with 2 doses of MMR vaccine or rubella immunization with 1 dose of MMR vaccine.  Pneumococcal 13-valent conjugate (PCV13) vaccine. When indicated, a person who is uncertain of his immunization history and has no record of immunization should receive the PCV13 vaccine. All adults 65 years of age and older should receive this  vaccine. An adult aged 19 years or older who has certain medical conditions and has not been previously immunized should receive 1 dose of PCV13 vaccine. This PCV13 should be followed with a dose of pneumococcal polysaccharide (PPSV23) vaccine. Adults who are at high risk for pneumococcal disease should obtain the PPSV23 vaccine at least 8 weeks after the dose of PCV13 vaccine. Adults older than 35 years of age who have normal immune system function should obtain the PPSV23 vaccine dose at least 1 year after the dose of PCV13 vaccine.  Pneumococcal polysaccharide (PPSV23) vaccine. When PCV13 is also indicated, PCV13 should be obtained first. All adults aged 65 years and older should be immunized. An adult younger than age 65 years who has certain medical conditions should be immunized. Any person who resides in a nursing home or long-term care facility should be immunized. An adult smoker should be immunized. People with an immunocompromised condition and certain other conditions should receive both PCV13 and PPSV23 vaccines. People with human immunodeficiency virus (HIV) infection should be immunized as soon as possible after diagnosis. Immunization during chemotherapy or radiation therapy should be avoided. Routine use of PPSV23 vaccine is not recommended for American Indians, [place] Natives, or people younger than 65 years unless there are medical conditions that require PPSV23 vaccine. When indicated, people who have unknown immunization and have no record of immunization should receive PPSV23 vaccine. One-time revaccination 5 years after the first dose of PPSV23 is recommended for people aged 19-64 years who have chronic kidney failure, nephrotic syndrome, asplenia, or immunocompromised conditions. People who received 1-2 doses of PPSV23 before age 65 years should receive another dose of PPSV23 vaccine at age 65 years or later if at least 5 years have passed since the previous dose. Doses of PPSV23 are not  needed for people immunized with PPSV23 at or after age 65 years.  Meningococcal vaccine. Adults with asplenia or persistent complement component deficiencies should receive 2 doses of quadrivalent meningococcal conjugate (MenACWY-D) vaccine. The doses should be obtained   at least 2 months apart. Microbiologists working with certain meningococcal bacteria, [place] recruits, people at risk during an outbreak, and people who travel to or live in countries with a high rate of meningitis should be immunized. A first-year college student up through age 34 years who is living in a residence hall should receive a dose if she did not receive a dose on or after her 16th birthday. Adults who have certain high-risk conditions should receive one or more doses of vaccine.  Hepatitis A vaccine. Adults who wish to be protected from this disease, have certain high-risk conditions, work with hepatitis A-infected animals, work in hepatitis A research labs, or travel to or work in countries with a high rate of hepatitis A should be immunized. Adults who were previously unvaccinated and who anticipate close contact with an international adoptee during the first 60 days after arrival in the [place] from a country with a high rate of hepatitis A should be immunized.  Hepatitis B vaccine. Adults who wish to be protected from this disease, have certain high-risk conditions, may be exposed to blood or other infectious body fluids, are household contacts or sex partners of hepatitis B positive people, are clients or workers in certain care facilities, or travel to or work in countries with a high rate of hepatitis B should be immunized.  Haemophilus influenzae type b (Hib) vaccine. A previously unvaccinated person with asplenia or sickle cell disease or having a scheduled splenectomy should receive 1 dose of Hib vaccine. Regardless of previous immunization, a recipient of a hematopoietic stem cell transplant should receive a  3-dose series 6-12 months after her successful transplant. Hib vaccine is not recommended for adults with HIV infection. Preventive Services / Frequency Ages 35 to 4 years  Blood pressure check.** / Every 3-5 years.  Lipid and cholesterol check.** / Every 5 years beginning at age 60.  Clinical breast exam.** / Every 3 years for women in their 71s and 10s.  BRCA-related cancer risk assessment.** / For women who have family members with a BRCA-related cancer (breast, ovarian, tubal, or peritoneal cancers).  Pap test.** / Every 2 years from ages 76 through 26. Every 3 years starting at age 61 through age 76 or 93 with a history of 3 consecutive normal Pap tests.  HPV screening.** / Every 3 years from ages 37 through ages 60 to 51 with a history of 3 consecutive normal Pap tests.  Hepatitis C blood test.** / For any individual with known risks for hepatitis C.  Skin self-exam. / Monthly.  Influenza vaccine. / Every year.  Tetanus, diphtheria, and acellular pertussis (Tdap, Td) vaccine.** / Consult your health care provider. Pregnant women should receive 1 dose of Tdap vaccine during each pregnancy. 1 dose of Td every 10 years.  Varicella vaccine.** / Consult your health care provider. Pregnant females who do not have evidence of immunity should receive the first dose after pregnancy.  HPV vaccine. / 3 doses over 6 months, if 93 and younger. The vaccine is not recommended for use in pregnant females. However, pregnancy testing is not needed before receiving a dose.  Measles, mumps, rubella (MMR) vaccine.** / You need at least 1 dose of MMR if you were born in 1957 or later. You may also need a 2nd dose. For females of childbearing age, rubella immunity should be determined. If there is no evidence of immunity, females who are not pregnant should be vaccinated. If there is no evidence of immunity, females who are  pregnant should delay immunization until after pregnancy.  Pneumococcal  13-valent conjugate (PCV13) vaccine.** / Consult your health care provider.  Pneumococcal polysaccharide (PPSV23) vaccine.** / 1 to 2 doses if you smoke cigarettes or if you have certain conditions.  Meningococcal vaccine.** / 1 dose if you are age 68 to 8 years and a Market researcher living in a residence hall, or have one of several medical conditions, you need to get vaccinated against meningococcal disease. You may also need additional booster doses.  Hepatitis A vaccine.** / Consult your health care provider.  Hepatitis B vaccine.** / Consult your health care provider.  Haemophilus influenzae type b (Hib) vaccine.** / Consult your health care provider. Ages 7 to 53 years  Blood pressure check.** / Every year.  Lipid and cholesterol check.** / Every 5 years beginning at age 25 years.  Lung cancer screening. / Every year if you are aged 11-80 years and have a 30-pack-year history of smoking and currently smoke or have quit within the past 15 years. Yearly screening is stopped once you have quit smoking for at least 15 years or develop a health problem that would prevent you from having lung cancer treatment.  Clinical breast exam.** / Every year after age 48 years.  BRCA-related cancer risk assessment.** / For women who have family members with a BRCA-related cancer (breast, ovarian, tubal, or peritoneal cancers).  Mammogram.** / Every year beginning at age 41 years and continuing for as long as you are in good health. Consult with your health care provider.  Pap test.** / Every 3 years starting at age 65 years through age 37 or 70 years with a history of 3 consecutive normal Pap tests.  HPV screening.** / Every 3 years from ages 72 years through ages 60 to 40 years with a history of 3 consecutive normal Pap tests.  Fecal occult blood test (FOBT) of stool. / Every year beginning at age 21 years and continuing until age 5 years. You may not need to do this test if you get  a colonoscopy every 10 years.  Flexible sigmoidoscopy or colonoscopy.** / Every 5 years for a flexible sigmoidoscopy or every 10 years for a colonoscopy beginning at age 35 years and continuing until age 48 years.  Hepatitis C blood test.** / For all people born from [birth year] through 1965 and any individual with known risks for hepatitis C.  Skin self-exam. / Monthly.  Influenza vaccine. / Every year.  Tetanus, diphtheria, and acellular pertussis (Tdap/Td) vaccine.** / Consult your health care provider. Pregnant women should receive 1 dose of Tdap vaccine during each pregnancy. 1 dose of Td every 10 years.  Varicella vaccine.** / Consult your health care provider. Pregnant females who do not have evidence of immunity should receive the first dose after pregnancy.  Zoster vaccine.** / 1 dose for adults aged 30 years or older.  Measles, mumps, rubella (MMR) vaccine.** / You need at least 1 dose of MMR if you were born in 1957 or later. You may also need a second dose. For females of childbearing age, rubella immunity should be determined. If there is no evidence of immunity, females who are not pregnant should be vaccinated. If there is no evidence of immunity, females who are pregnant should delay immunization until after pregnancy.  Pneumococcal 13-valent conjugate (PCV13) vaccine.** / Consult your health care provider.  Pneumococcal polysaccharide (PPSV23) vaccine.** / 1 to 2 doses if you smoke cigarettes or if you have certain conditions.  Meningococcal vaccine.** /  Consult your health care provider.  Hepatitis A vaccine.** / Consult your health care provider.  Hepatitis B vaccine.** / Consult your health care provider.  Haemophilus influenzae type b (Hib) vaccine.** / Consult your health care provider. Ages 64 years and over  Blood pressure check.** / Every year.  Lipid and cholesterol check.** / Every 5 years beginning at age 23 years.  Lung cancer screening. / Every year if you  are aged 16-80 years and have a 30-pack-year history of smoking and currently smoke or have quit within the past 15 years. Yearly screening is stopped once you have quit smoking for at least 15 years or develop a health problem that would prevent you from having lung cancer treatment.  Clinical breast exam.** / Every year after age 74 years.  BRCA-related cancer risk assessment.** / For women who have family members with a BRCA-related cancer (breast, ovarian, tubal, or peritoneal cancers).  Mammogram.** / Every year beginning at age 44 years and continuing for as long as you are in good health. Consult with your health care provider.  Pap test.** / Every 3 years starting at age 58 years through age 22 or 39 years with 3 consecutive normal Pap tests. Testing can be stopped between 65 and 70 years with 3 consecutive normal Pap tests and no abnormal Pap or HPV tests in the past 10 years.  HPV screening.** / Every 3 years from ages 64 years through ages 70 or 61 years with a history of 3 consecutive normal Pap tests. Testing can be stopped between 65 and 70 years with 3 consecutive normal Pap tests and no abnormal Pap or HPV tests in the past 10 years.  Fecal occult blood test (FOBT) of stool. / Every year beginning at age 40 years and continuing until age 27 years. You may not need to do this test if you get a colonoscopy every 10 years.  Flexible sigmoidoscopy or colonoscopy.** / Every 5 years for a flexible sigmoidoscopy or every 10 years for a colonoscopy beginning at age 7 years and continuing until age 32 years.  Hepatitis C blood test.** / For all people born from [birth year] through 1965 and any individual with known risks for hepatitis C.  Osteoporosis screening.** / A one-time screening for women ages 30 years and over and women at risk for fractures or osteoporosis.  Skin self-exam. / Monthly.  Influenza vaccine. / Every year.  Tetanus, diphtheria, and acellular pertussis (Tdap/Td)  vaccine.** / 1 dose of Td every 10 years.  Varicella vaccine.** / Consult your health care provider.  Zoster vaccine.** / 1 dose for adults aged 35 years or older.  Pneumococcal 13-valent conjugate (PCV13) vaccine.** / Consult your health care provider.  Pneumococcal polysaccharide (PPSV23) vaccine.** / 1 dose for all adults aged 46 years and older.  Meningococcal vaccine.** / Consult your health care provider.  Hepatitis A vaccine.** / Consult your health care provider.  Hepatitis B vaccine.** / Consult your health care provider.  Haemophilus influenzae type b (Hib) vaccine.** / Consult your health care provider. ** Family history and personal history of risk and conditions may change your health care provider's recommendations.   This information is not intended to replace advice given to you by your health care provider. Make sure you discuss any questions you have with your health care provider.   Document Released: 10/31/2001 Document Revised: 09/25/2014 Document Reviewed: 01/30/2011 Elsevier Interactive Patient Education [employer].

## 2016-03-02 NOTE — Progress Notes (Signed)
Pre visit review using our clinic review tool, if applicable. No additional management support is needed unless otherwise documented below in the visit note. 

## 2016-03-03 ENCOUNTER — Other Ambulatory Visit (INDEPENDENT_AMBULATORY_CARE_PROVIDER_SITE_OTHER): Payer: BLUE CROSS/BLUE SHIELD

## 2016-03-03 ENCOUNTER — Other Ambulatory Visit: Payer: Self-pay | Admitting: Family Medicine

## 2016-03-03 ENCOUNTER — Other Ambulatory Visit (HOSPITAL_COMMUNITY)
Admission: RE | Admit: 2016-03-03 | Discharge: 2016-03-03 | Disposition: A | Discharge status: Home / Self Care | Payer: BLUE CROSS/BLUE SHIELD | Source: Ambulatory Visit | Attending: Family Medicine | Admitting: Family Medicine

## 2016-03-03 ENCOUNTER — Telehealth: Payer: Self-pay | Admitting: *Deleted

## 2016-03-03 DIAGNOSIS — Z0000 Encounter for general adult medical examination without abnormal findings: Secondary | ICD-10-CM

## 2016-03-03 DIAGNOSIS — Z113 Encounter for screening for infections with a predominantly sexual mode of transmission: Secondary | ICD-10-CM | POA: Insufficient documentation

## 2016-03-03 DIAGNOSIS — Z202 Contact with and (suspected) exposure to infections with a predominantly sexual mode of transmission: Secondary | ICD-10-CM

## 2016-03-03 LAB — CBC WITH DIFFERENTIAL/PLATELET
Basophils Absolute: 0 10*3/uL (ref 0.0–0.1)
Basophils Relative: 0.3 % (ref 0.0–3.0)
Eosinophils Absolute: 0.1 10*3/uL (ref 0.0–0.7)
Eosinophils Relative: 0.9 % (ref 0.0–5.0)
HCT: 38.9 % (ref 36.0–46.0)
Hemoglobin: 12 g/dL (ref 12.0–15.0)
Lymphocytes Relative: 20 % (ref 12.0–46.0)
Lymphs Abs: 2.9 10*3/uL (ref 0.7–4.0)
MCHC: 30.9 g/dL (ref 30.0–36.0)
MCV: 72.1 fl — ABNORMAL LOW (ref 78.0–100.0)
Monocytes Absolute: 0.8 10*3/uL (ref 0.1–1.0)
Monocytes Relative: 5.3 % (ref 3.0–12.0)
Neutro Abs: 10.8 10*3/uL — ABNORMAL HIGH (ref 1.4–7.7)
Neutrophils Relative %: 73.5 % (ref 43.0–77.0)
Platelets: 383 10*3/uL (ref 150.0–400.0)
RBC: 5.4 Mil/uL — ABNORMAL HIGH (ref 3.87–5.11)
RDW: 16.7 % — ABNORMAL HIGH (ref 11.5–15.5)
WBC: 14.7 10*3/uL — ABNORMAL HIGH (ref 4.0–10.5)

## 2016-03-03 LAB — LIPID PANEL
Cholesterol: 143 mg/dL (ref 0–200)
HDL: 38.8 mg/dL — ABNORMAL LOW (ref >39.00)
LDL Cholesterol: 90 mg/dL (ref 0–99)
NonHDL: 103.84
Total CHOL/HDL Ratio: 4
Triglycerides: 70 mg/dL (ref 0.0–149.0)
VLDL: 14 mg/dL (ref 0.0–40.0)

## 2016-03-03 LAB — COMPREHENSIVE METABOLIC PANEL
ALT: 18 U/L (ref 0–35)
AST: 19 U/L (ref 0–37)
Albumin: 4 g/dL (ref 3.5–5.2)
Alkaline Phosphatase: 129 U/L — ABNORMAL HIGH (ref 39–117)
BUN: 8 mg/dL (ref 6–23)
CO2: 26 mEq/L (ref 19–32)
Calcium: 9.6 mg/dL (ref 8.4–10.5)
Chloride: 104 mEq/L (ref 96–112)
Creatinine, Ser: 0.75 mg/dL (ref 0.40–1.20)
GFR: 113.19 mL/min (ref >60.00)
Glucose, Bld: 81 mg/dL (ref 70–99)
Potassium: 3.7 mEq/L (ref 3.5–5.1)
Sodium: 139 mEq/L (ref 135–145)
Total Bilirubin: 0.6 mg/dL (ref 0.2–1.2)
Total Protein: 8.2 g/dL (ref 6.0–8.3)

## 2016-03-03 LAB — POCT URINALYSIS DIPSTICK
Bilirubin, UA: NEGATIVE
Blood, UA: NEGATIVE
Glucose, UA: NEGATIVE
Leukocytes, UA: NEGATIVE
Nitrite, UA: NEGATIVE
Protein, UA: NEGATIVE
Spec Grav, UA: >=1.03
Urobilinogen, UA: 0.2
pH, UA: 6

## 2016-03-03 LAB — TSH: TSH: 2.97 u[IU]/mL (ref 0.35–4.50)

## 2016-03-03 NOTE — Telephone Encounter (Signed)
Pt requesting a full std check . Please advise.

## 2016-03-03 NOTE — Addendum Note (Signed)
Addended by: Eustace Quail on: 03/03/2016 02:41 PM   Modules accepted: Orders

## 2016-03-03 NOTE — Telephone Encounter (Signed)
I'll put orders iin

## 2016-03-04 LAB — RPR

## 2016-03-04 LAB — HEPATITIS C ANTIBODY: HCV Ab: NEGATIVE

## 2016-03-06 LAB — HIV ANTIBODY (ROUTINE TESTING W REFLEX): HIV 1&2 Ab, 4th Generation: NONREACTIVE

## 2016-03-06 LAB — HSV 2 ANTIBODY, IGG: HSV 2 Glycoprotein G Ab, IgG: 14.7 Index — ABNORMAL HIGH (ref <0.90)

## 2016-03-07 LAB — URINE CYTOLOGY ANCILLARY ONLY
Chlamydia: NEGATIVE
Neisseria Gonorrhea: NEGATIVE
Trichomonas: NEGATIVE

## 2016-03-08 ENCOUNTER — Encounter: Payer: Self-pay | Admitting: Family Medicine

## 2016-03-09 MED FILL — VIT D2 1.25 MG (50,000 UNIT: 1.25 MG | 28 days supply | Qty: 4 | Fill #0

## 2016-03-09 MED FILL — PROAIR HFA 90 MCG INHALER: 108 (90 BAS | 30 days supply | Qty: 9 | Fill #0

## 2016-03-09 MED FILL — FLUoxetine HCL 20 MG CAPS: 20 | 30 days supply | Qty: 30 | Fill #0

## 2016-03-09 MED FILL — CYCLOBENZAPRINE 10 MG TAB: 10 | 10 days supply | Qty: 30 | Fill #0

## 2016-03-10 LAB — URINE CYTOLOGY ANCILLARY ONLY
Bacterial vaginitis: NEGATIVE
Candida vaginitis: NEGATIVE

## 2016-03-10 NOTE — Telephone Encounter (Signed)
Message left to call the office.    KP 

## 2016-03-10 NOTE — Telephone Encounter (Signed)
Labs reviewed with patient and she verbalized understating, I also advised that her partner can be tested as well for confirmation on whether he has been exposed or not.  KP

## 2016-04-11 ENCOUNTER — Encounter: Payer: Self-pay | Admitting: Family Medicine

## 2016-04-19 MED FILL — valACYclovir HCL 1 GM TABS: 1 | 90 days supply | Qty: 90 | Fill #2

## 2016-04-19 MED FILL — ALPRAZolam 0.25 MG TABS: 0.25 | 10 days supply | Qty: 30 | Fill #0

## 2016-04-24 ENCOUNTER — Encounter: Payer: Self-pay | Admitting: Family Medicine

## 2016-04-24 NOTE — Telephone Encounter (Signed)
Please look at this 

## 2016-05-05 ENCOUNTER — Ambulatory Visit (INDEPENDENT_AMBULATORY_CARE_PROVIDER_SITE_OTHER): Payer: BLUE CROSS/BLUE SHIELD | Admitting: Women's Health

## 2016-05-05 ENCOUNTER — Encounter: Payer: Self-pay | Admitting: Women's Health

## 2016-05-05 VITALS — BP 122/83 | Ht 68.0 in | Wt 266.6 lb

## 2016-05-05 DIAGNOSIS — Z01419 Encounter for gynecological examination (general) (routine) without abnormal findings: Secondary | ICD-10-CM

## 2016-05-05 NOTE — Addendum Note (Signed)
Addended by: Aura Camps on: 05/05/2016 10:23 AM   Modules accepted: Orders

## 2016-05-05 NOTE — Progress Notes (Signed)
MURRAY VODA October 12, 1980 [id number]    History:    Presents for annual exam.  Monthly cycle condoms and withdrawal and uses an app avoids intercourse with ovulation.  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 Negative STD screen 03/2016 same partner. Normal Pap history per patient.  Past medical history, past surgical history, family history and social history were all reviewed and documented in the EPIC chart. 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 History of a goiter has had ultrasound follow-up. Parents diabetes and heart disease.  ROS:  A ROS was performed and pertinent positives and negatives are included.  Exam:  Vitals:   05/05/16 0943  BP: 122/83  Weight: 266 lb 9.6 oz (120.9 kg)  Height: 5\' 8"  (1.727 m)   Body mass index is 40.54 kg/m.   General appearance:  Normal Thyroid:  Symmetrical, normal in size, without palpable masses or nodularity. Respiratory  Auscultation:  Clear without wheezing or rhonchi Cardiovascular  Auscultation:  Regular rate, without rubs, murmurs or gallops  Edema/varicosities:  Not grossly evident Abdominal  Soft,nontender, without masses, guarding or rebound.  Liver/spleen:  No organomegaly noted  Hernia:  None appreciated  Skin  Inspection:  Grossly normal   Breasts: Examined lying and sitting.     Right: Without masses, retractions, discharge or axillary adenopathy.     Left: Without masses, retractions, discharge or axillary adenopathy. Gentitourinary   Inguinal/mons:  Normal without inguinal adenopathy  External genitalia:  Normal  BUS/Urethra/Skene's glands:  Normal  Vagina:  Normal  Cervix:  Normal  Uterus:   normal in size, shape and contour.  Midline and mobile  Adnexa/parametria:     Rt: Without masses or tenderness.   Lt: Without masses or tenderness.  Anus and perineum: Normal  Digital rectal exam: Normal sphincter tone without palpated masses or tenderness  Assessment/Plan:  35 y.o. SBF G0  for annual exam with complaint of questionable  vaginal cyst.  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 feeling cervix . SBE's, annual screening mammogram at 40, calcium rich diet, MVI daily encouraged. Reviewed importance of continuing healthy diet and exercise has lost 20 pounds in the past month. Blood sugar, lipid panel okay low HDL. Contraception reviewed and declines. UA, Pap with HR HPV typing, new screening guidelines reviewed.    Harrington Challenger WHNP, 10:12 AM 05/05/2016

## 2016-05-05 NOTE — Patient Instructions (Signed)
Health Maintenance, Female Adopting a healthy lifestyle and getting preventive care can go a long way to promote health and wellness. Talk with your health care provider about what schedule of regular examinations is right for you. This is a good chance for you to check in with your provider about disease prevention and staying healthy. In between checkups, there are plenty of things you can do on your own. Experts have done a lot of research about which lifestyle changes and preventive measures are most likely to keep you healthy. Ask your health care provider for more information. WEIGHT AND DIET  Eat a healthy diet  Be sure to include plenty of vegetables, fruits, low-fat dairy products, and lean protein.  Do not eat a lot of foods high in solid fats, added sugars, or salt.  Get regular exercise. This is one of the most important things you can do for your health.  Most adults should exercise for at least 150 minutes each week. The exercise should increase your heart rate and make you sweat (moderate-intensity exercise).  Most adults should also do strengthening exercises at least twice a week. This is in addition to the moderate-intensity exercise.  Maintain a healthy weight  Body mass index (BMI) is a measurement that can be used to identify possible weight problems. It estimates body fat based on height and weight. Your health care provider can help determine your BMI and help you achieve or maintain a healthy weight.  For females 20 years of age and older:   A BMI below 18.5 is considered underweight.  A BMI of 18.5 to 24.9 is normal.  A BMI of 25 to 29.9 is considered overweight.  A BMI of 30 and above is considered obese.  Watch levels of cholesterol and blood lipids  You should start having your blood tested for lipids and cholesterol at 35 years of age, then have this test every 5 years.  You may need to have your cholesterol levels checked more often if:  Your lipid  or cholesterol levels are high.  You are older than 35 years of age.  You are at high risk for heart disease.  CANCER SCREENING   Lung Cancer  Lung cancer screening is recommended for adults 55-80 years old who are at high risk for lung cancer because of a history of smoking.  A yearly low-dose CT scan of the lungs is recommended for people who:  Currently smoke.  Have quit within the past 15 years.  Have at least a 30-pack-year history of smoking. A pack year is smoking an average of one pack of cigarettes a day for 1 year.  Yearly screening should continue until it has been 15 years since you quit.  Yearly screening should stop if you develop a health problem that would prevent you from having lung cancer treatment.  Breast Cancer  Practice breast self-awareness. This means understanding how your breasts normally appear and feel.  It also means doing regular breast self-exams. Let your health care provider know about any changes, no matter how small.  If you are in your 20s or 30s, you should have a clinical breast exam (CBE) by a health care provider every 1-3 years as part of a regular health exam.  If you are 40 or older, have a CBE every year. Also consider having a breast X-ray (mammogram) every year.  If you have a family history of breast cancer, talk to your health care provider about genetic screening.  If you   are at high risk for breast cancer, talk to your health care provider about having an MRI and a mammogram every year.  Breast cancer gene (BRCA) assessment is recommended for women who have family members with BRCA-related cancers. BRCA-related cancers include:  Breast.  Ovarian.  Tubal.  Peritoneal cancers.  Results of the assessment will determine the need for genetic counseling and BRCA1 and BRCA2 testing. Cervical Cancer Your health care provider may recommend that you be screened regularly for cancer of the pelvic organs (ovaries, uterus, and  vagina). This screening involves a pelvic examination, including checking for microscopic changes to the surface of your cervix (Pap test). You may be encouraged to have this screening done every 3 years, beginning at age 21.  For women ages 30-65, health care providers may recommend pelvic exams and Pap testing every 3 years, or they may recommend the Pap and pelvic exam, combined with testing for human papilloma virus (HPV), every 5 years. Some types of HPV increase your risk of cervical cancer. Testing for HPV may also be done on women of any age with unclear Pap test results.  Other health care providers may not recommend any screening for nonpregnant women who are considered low risk for pelvic cancer and who do not have symptoms. Ask your health care provider if a screening pelvic exam is right for you.  If you have had past treatment for cervical cancer or a condition that could lead to cancer, you need Pap tests and screening for cancer for at least 20 years after your treatment. If Pap tests have been discontinued, your risk factors (such as having a new sexual partner) need to be reassessed to determine if screening should resume. Some women have medical problems that increase the chance of getting cervical cancer. In these cases, your health care provider may recommend more frequent screening and Pap tests. Colorectal Cancer  This type of cancer can be detected and often prevented.  Routine colorectal cancer screening usually begins at 35 years of age and continues through 35 years of age.  Your health care provider may recommend screening at an earlier age if you have risk factors for colon cancer.  Your health care provider may also recommend using home test kits to check for hidden blood in the stool.  A small camera at the end of a tube can be used to examine your colon directly (sigmoidoscopy or colonoscopy). This is done to check for the earliest forms of colorectal  cancer.  Routine screening usually begins at age 50.  Direct examination of the colon should be repeated every 5-10 years through 35 years of age. However, you may need to be screened more often if early forms of precancerous polyps or small growths are found. Skin Cancer  Check your skin from head to toe regularly.  Tell your health care provider about any new moles or changes in moles, especially if there is a change in a mole's shape or color.  Also tell your health care provider if you have a mole that is larger than the size of a pencil eraser.  Always use sunscreen. Apply sunscreen liberally and repeatedly throughout the day.  Protect yourself by wearing long sleeves, pants, a wide-brimmed hat, and sunglasses whenever you are outside. HEART DISEASE, DIABETES, AND HIGH BLOOD PRESSURE   High blood pressure causes heart disease and increases the risk of stroke. High blood pressure is more likely to develop in:  People who have blood pressure in the high end   of the normal range (130-139/85-89 mm Hg).  People who are overweight or obese.  People who are African American.  If you are 38-23 years of age, have your blood pressure checked every 3-5 years. If you are 61 years of age or older, have your blood pressure checked every year. You should have your blood pressure measured twice--once when you are at a hospital or clinic, and once when you are not at a hospital or clinic. Record the average of the two measurements. To check your blood pressure when you are not at a hospital or clinic, you can use:  An automated blood pressure machine at a pharmacy.  A home blood pressure monitor.  If you are between 45 years and 39 years old, ask your health care provider if you should take aspirin to prevent strokes.  Have regular diabetes screenings. This involves taking a blood sample to check your fasting blood sugar level.  If you are at a normal weight and have a low risk for diabetes,  have this test once every three years after 35 years of age.  If you are overweight and have a high risk for diabetes, consider being tested at a younger age or more often. PREVENTING INFECTION  Hepatitis B  If you have a higher risk for hepatitis B, you should be screened for this virus. You are considered at high risk for hepatitis B if:  You were born in a country where hepatitis B is common. Ask your health care provider which countries are considered high risk.  Your parents were born in a high-risk country, and you have not been immunized against hepatitis B (hepatitis B vaccine).  You have HIV or AIDS.  You use needles to inject street drugs.  You live with someone who has hepatitis B.  You have had sex with someone who has hepatitis B.  You get hemodialysis treatment.  You take certain medicines for conditions, including cancer, organ transplantation, and autoimmune conditions. Hepatitis C  Blood testing is recommended for:  Everyone born from [birth year] through 1965.  Anyone with known risk factors for hepatitis C. Sexually transmitted infections (STIs)  You should be screened for sexually transmitted infections (STIs) including gonorrhea and chlamydia if:  You are sexually active and are younger than 35 years of age.  You are older than 35 years of age and your health care provider tells you that you are at risk for this type of infection.  Your sexual activity has changed since you were last screened and you are at an increased risk for chlamydia or gonorrhea. Ask your health care provider if you are at risk.  If you do not have HIV, but are at risk, it may be recommended that you take a prescription medicine daily to prevent HIV infection. This is called pre-exposure prophylaxis (PrEP). You are considered at risk if:  You are sexually active and do not regularly use condoms or know the HIV status of your partner(s).  You take drugs by injection.  You are sexually  active with a partner who has HIV. Talk with your health care provider about whether you are at high risk of being infected with HIV. If you choose to begin PrEP, you should first be tested for HIV. You should then be tested every 3 months for as long as you are taking PrEP.  PREGNANCY   If you are premenopausal and you may become pregnant, ask your health care provider about preconception counseling.  If you may  become pregnant, take 400 to 800 micrograms (mcg) of folic acid every day.  If you want to prevent pregnancy, talk to your health care provider about birth control (contraception). OSTEOPOROSIS AND MENOPAUSE   Osteoporosis is a disease in which the bones lose minerals and strength with aging. This can result in serious bone fractures. Your risk for osteoporosis can be identified using a bone density scan.  If you are 61 years of age or older, or if you are at risk for osteoporosis and fractures, ask your health care provider if you should be screened.  Ask your health care provider whether you should take a calcium or vitamin D supplement to lower your risk for osteoporosis.  Menopause may have certain physical symptoms and risks.  Hormone replacement therapy may reduce some of these symptoms and risks. Talk to your health care provider about whether hormone replacement therapy is right for you.  HOME CARE INSTRUCTIONS   Schedule regular health, dental, and eye exams.  Stay current with your immunizations.   Do not use any tobacco products including cigarettes, chewing tobacco, or electronic cigarettes.  If you are pregnant, do not drink alcohol.  If you are breastfeeding, limit how much and how often you drink alcohol.  Limit alcohol intake to no more than 1 drink per day for nonpregnant women. One drink equals 12 ounces of beer, 5 ounces of wine, or 1 ounces of hard liquor.  Do not use street drugs.  Do not share needles.  Ask your health care provider for help if  you need support or information about quitting drugs.  Tell your health care provider if you often feel depressed.  Tell your health care provider if you have ever been abused or do not feel safe at home.   This information is not intended to replace advice given to you by your health care provider. Make sure you discuss any questions you have with your health care provider.   Document Released: 03/20/2011 Document Revised: 09/25/2014 Document Reviewed: 08/06/2013 Elsevier Interactive Patient Education [employer].

## 2016-05-08 LAB — PAP, TP IMAGING W/ HPV RNA, RFLX HPV TYPE 16,18/45: HPV mRNA, High Risk: NOT DETECTED

## 2016-06-02 ENCOUNTER — Encounter: Payer: Self-pay | Admitting: Family Medicine

## 2016-07-14 MED FILL — valACYclovir HCL 1 GM TABS: 1 | 90 days supply | Qty: 90 | Fill #3

## 2016-07-18 DIAGNOSIS — M222X2 Patellofemoral disorders, left knee: Secondary | ICD-10-CM | POA: Diagnosis not present

## 2016-07-18 DIAGNOSIS — M25562 Pain in left knee: Secondary | ICD-10-CM | POA: Diagnosis not present

## 2016-07-25 DIAGNOSIS — M222X2 Patellofemoral disorders, left knee: Secondary | ICD-10-CM | POA: Diagnosis not present

## 2016-07-28 DIAGNOSIS — M222X2 Patellofemoral disorders, left knee: Secondary | ICD-10-CM | POA: Diagnosis not present

## 2016-08-01 DIAGNOSIS — M222X2 Patellofemoral disorders, left knee: Secondary | ICD-10-CM | POA: Diagnosis not present

## 2016-08-08 DIAGNOSIS — M222X2 Patellofemoral disorders, left knee: Secondary | ICD-10-CM | POA: Diagnosis not present

## 2016-08-15 DIAGNOSIS — M222X2 Patellofemoral disorders, left knee: Secondary | ICD-10-CM | POA: Diagnosis not present

## 2016-08-17 DIAGNOSIS — M222X2 Patellofemoral disorders, left knee: Secondary | ICD-10-CM | POA: Diagnosis not present

## 2016-09-01 DIAGNOSIS — M25562 Pain in left knee: Secondary | ICD-10-CM | POA: Diagnosis not present

## 2016-09-01 DIAGNOSIS — M222X2 Patellofemoral disorders, left knee: Secondary | ICD-10-CM | POA: Diagnosis not present

## 2016-10-10 MED FILL — valACYclovir HCL 1 GM TABS: 1 | 90 days supply | Qty: 90 | Fill #0

## 2017-01-05 ENCOUNTER — Encounter: Payer: Self-pay | Admitting: Obstetrics & Gynecology

## 2017-01-05 ENCOUNTER — Ambulatory Visit (INDEPENDENT_AMBULATORY_CARE_PROVIDER_SITE_OTHER): Payer: BLUE CROSS/BLUE SHIELD | Admitting: Obstetrics & Gynecology

## 2017-01-05 VITALS — BP 134/86

## 2017-01-05 DIAGNOSIS — N812 Incomplete uterovaginal prolapse: Secondary | ICD-10-CM | POA: Diagnosis not present

## 2017-01-05 DIAGNOSIS — Z3169 Encounter for other general counseling and advice on procreation: Secondary | ICD-10-CM

## 2017-01-05 NOTE — Progress Notes (Signed)
    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  Established patient presenting because she feels her uterus bulging lower in the vagina.  Up to date with Annual/Gyn exam with Harriett Sine 04/2016.   Very mild symptoms with the bulging sensation mainly when sitting for prolonged periods.  No pain.  No abnormal d/c.  No SUI.  No problem with stools.  Planning to attempt conception next year and is worried it will affect her ability to become pregnant and carry her pregnancy to term.  Past medical history,surgical history, problem list, medications, allergies, family history and social history were all reviewed and documented in the EPIC chart.  Directed ROS with pertinent positives and negatives documented in the history of present illness/assessment and plan.  Exam:  Vitals:   01/05/17 0943  BP: 134/86   General appearance:  Normal  Gyn exam:  Vulva normal                     Vagina normal, no CystoRectocele                     Uterus normal volume AV, long cervix.  Uterine prolapse 1/3.  But with long Cervix, it comes to lower 1/3 of vagina in                       laying down position and just above the vulva in standing position with valsalva.                     No adnexal mass felt   Assessment/Plan:  36 y.o.   1. First degree uterine prolapse Reassured.  Avoid pressure on Pelvic floor.  2. Cervix prolapsed into vagina  Long cervix.  Reassure that Fertility and Pregnancy should not be negatively affected by that issue.  A pessary may be used in the future as needed.    3.  Preconception counseling Menstrual cycle, timed IC and PNVs discussed.  Counseling >50% x 25 min on above issues.   [name] MD, 10:07 AM 01/05/2017

## 2017-01-05 NOTE — Patient Instructions (Signed)
Your Gynecologic exam revealed a long cervix prolapsing in the vagina with just a first degree Uterine prolapse.  Given that your symptoms are mild, I recommend simply observation.  I can reassure you that your fertility should not be affected by this issue and if the low cervix in the vagina bothers you during pregnancy, a pessary could be put in place.  We also discussed the menstrual cycle and timed intercourse.  PNVs are recommended when you are ready to attempt conception.   It was a pleasure to meet you today!

## 2017-01-15 MED FILL — valACYclovir HCL 1 GM TABS: 1 | 90 days supply | Qty: 90 | Fill #1

## 2017-01-31 ENCOUNTER — Encounter: Payer: Self-pay | Admitting: Gynecology

## 2017-04-04 ENCOUNTER — Encounter: Payer: Self-pay | Admitting: Family Medicine

## 2017-04-04 NOTE — Telephone Encounter (Signed)
I have never rx that.  Side effect profile is no better or worse than adipex.   Give her info for Dr Dalbert Garnet

## 2017-04-05 ENCOUNTER — Encounter: Payer: Self-pay | Admitting: Family Medicine

## 2017-04-05 ENCOUNTER — Other Ambulatory Visit: Payer: Self-pay | Admitting: Family Medicine

## 2017-04-05 ENCOUNTER — Ambulatory Visit (INDEPENDENT_AMBULATORY_CARE_PROVIDER_SITE_OTHER): Payer: BLUE CROSS/BLUE SHIELD | Admitting: Family Medicine

## 2017-04-05 VITALS — BP 118/84 | HR 91 | Temp 98.0°F | Ht 67.0 in | Wt 276.1 lb

## 2017-04-05 DIAGNOSIS — A6000 Herpesviral infection of urogenital system, unspecified: Secondary | ICD-10-CM

## 2017-04-05 DIAGNOSIS — Z6841 Body Mass Index (BMI) 40.0 and over, adult: Secondary | ICD-10-CM

## 2017-04-05 DIAGNOSIS — R0683 Snoring: Secondary | ICD-10-CM

## 2017-04-05 NOTE — Patient Instructions (Signed)

## 2017-04-05 NOTE — Progress Notes (Signed)
Patient ID: [name], female    DOB: June 07, 1981  Age: 36 y.o. MRN: [id number]    Subjective:  Subjective  HPI [name] presents for c/o brain fog.  She snores at night and wakes up feeling not rested.  She is requesting weight loss meds--- phendimetrazine---  Explained to patient I did not prescribe adipex or phendimetrazine --- her ins will not pay for any weight loss meds.   Review of Systems  Constitutional: Negative for activity change, appetite change and unexpected weight change.  Respiratory: Negative for cough and shortness of breath.   Cardiovascular: Negative for chest pain and palpitations.  Psychiatric/Behavioral: Negative for behavioral problems and dysphoric mood. The patient is not nervous/anxious.     History Past Medical History:  Diagnosis Date  . Anemia   . Asthma   . Genital herpes   . Obesity   . Thyroid disease     She has a past surgical history that includes Keloid excision (2008) and Salivary gland surgery.   Her family history includes Cancer in her maternal grandfather and paternal grandfather; Cancer (age of onset: 60) in her father; Coronary artery disease in her unknown relative; Depression in her father; Heart disease (age of onset: 67) in her father; Hyperlipidemia in her father and mother; Hypertension in her father and mother; Prostate cancer in her unknown relative.She reports that she has never smoked. She has never used smokeless tobacco. She reports that she drinks alcohol. She reports that she does not use drugs.  Current Outpatient Prescriptions on File Prior to Visit  Medication Sig Dispense Refill  . albuterol (PROAIR HFA) 108 (90 Base) MCG/ACT inhaler Inhale 2 puffs into the lungs every 6 (six) hours as needed. 6.7 g 1  . ALPRAZolam (XANAX) 0.25 MG tablet Take 1 tablet (0.25 mg total) by mouth 3 (three) times daily as needed for sleep. 30 tablet 0  . cyclobenzaprine (FLEXERIL) 10 MG tablet Take 1 tablet (10 mg total) by mouth  3 (three) times daily as needed for muscle spasms. 30 tablet 0  . traMADol (ULTRAM) 50 MG tablet Take 1 tablet (50 mg total) by mouth every 8 (eight) hours as needed. 30 tablet 0   No current facility-administered medications on file prior to visit.      Objective:  Objective  Physical Exam  Constitutional: She is oriented to person, place, and time. She appears well-developed and well-nourished.  HENT:  Head: Normocephalic and atraumatic.  Eyes: Conjunctivae and EOM are normal.  Neck: Normal range of motion. Neck supple. No JVD present. Carotid bruit is not present. No thyromegaly present.  Cardiovascular: Normal rate, regular rhythm and normal heart sounds.   No murmur heard. Pulmonary/Chest: Effort normal and breath sounds normal. No respiratory distress. She has no wheezes. She has no rales. She exhibits no tenderness.  Musculoskeletal: She exhibits no edema.  Neurological: She is alert and oriented to person, place, and time.  Psychiatric: She has a normal mood and affect. Her behavior is normal. Judgment and thought content normal.  Nursing note and vitals reviewed.  BP 118/84 (BP Location: Left Arm, Patient Position: Sitting, Cuff Size: Large)   Pulse 91   Temp 98 F (36.7 C) (Oral)   Ht 5\' 7"  (1.702 m)   Wt 276 lb 2 oz (125.2 kg)   SpO2 97%   BMI 43.25 kg/m  Wt Readings from Last 3 Encounters:  04/05/17 276 lb 2 oz (125.2 kg)  05/05/16 266 lb 9.6 oz (120.9 kg)  03/02/16 277 lb 3.2 oz (125.7 kg)     Lab Results  Component Value Date   WBC 14.7 (H) 03/03/2016   HGB 12.0 03/03/2016   HCT 38.9 03/03/2016   PLT 383.0 03/03/2016   GLUCOSE 81 03/03/2016   CHOL 143 03/03/2016   TRIG 70.0 03/03/2016   HDL 38.80 (L) 03/03/2016   LDLCALC 90 03/03/2016   ALT 18 03/03/2016   AST 19 03/03/2016   NA 139 03/03/2016   K 3.7 03/03/2016   CL 104 03/03/2016   CREATININE 0.75 03/03/2016   BUN 8 03/03/2016   CO2 26 03/03/2016   TSH 2.97 03/03/2016    No results found.    Assessment & Plan:  Plan  I am having Ms. Zellman maintain her albuterol, ALPRAZolam, cyclobenzaprine, and traMADol.  No orders of the defined types were placed in this encounter.   Problem List Items Addressed This Visit      Unprioritized   Class 3 severe obesity due to excess calories without serious comorbidity with body mass index (BMI) of 40.0 to 44.9 in adult [facility])    Refer to healthy weight and wellness        Other Visit Diagnoses    Snoring    -  Primary   Relevant Orders   Ambulatory referral to Neurology      Follow-up: Return if symptoms worsen or fail to improve.  Donato Schultz, DO    Pre visit review using our clinic review tool, if applicable. No additional management support is needed unless otherwise documented below in the visit note.

## 2017-04-05 NOTE — Assessment & Plan Note (Signed)
-   Refer to healthy weight and wellness 

## 2017-04-06 MED FILL — valACYclovir HCL 1 GM TABS: 1 | 90 days supply | Qty: 90 | Fill #0

## 2017-04-11 ENCOUNTER — Telehealth: Payer: Self-pay | Admitting: Family Medicine

## 2017-04-11 DIAGNOSIS — F419 Anxiety disorder, unspecified: Secondary | ICD-10-CM

## 2017-04-11 DIAGNOSIS — F32A Depression, unspecified: Secondary | ICD-10-CM

## 2017-04-11 DIAGNOSIS — F329 Major depressive disorder, single episode, unspecified: Secondary | ICD-10-CM

## 2017-04-11 DIAGNOSIS — M549 Dorsalgia, unspecified: Secondary | ICD-10-CM

## 2017-04-13 ENCOUNTER — Encounter: Payer: Self-pay | Admitting: Family Medicine

## 2017-04-13 MED ORDER — TRAMADOL HCL 50 MG PO TABS: 50.0000 mg | ORAL_TABLET | Freq: Three times a day (TID) | ORAL | 0 refills | Status: DC | PRN

## 2017-04-13 MED ORDER — CYCLOBENZAPRINE HCL 10 MG PO TABS
10.0000 mg | ORAL_TABLET | Freq: Three times a day (TID) | ORAL | 0 refills | Status: DC | PRN
Start: 2017-04-13 — End: 2018-04-09

## 2017-04-13 MED ORDER — ALPRAZOLAM 0.25 MG PO TABS: 0.2500 mg | ORAL_TABLET | Freq: Three times a day (TID) | ORAL | 0 refills | Status: DC | PRN

## 2017-04-13 MED FILL — CYCLOBENZAPRINE 10 MG TABLE: 10 | 10 days supply | Qty: 30 | Fill #0

## 2017-04-13 NOTE — Telephone Encounter (Addendum)
Called patient and explained to her the urine drug screen process. She was pleasant and stated understanding. She agreed to come in on Monday, July 30th at 3:30pm.

## 2017-04-16 ENCOUNTER — Other Ambulatory Visit: Payer: BLUE CROSS/BLUE SHIELD

## 2017-04-16 ENCOUNTER — Encounter: Payer: Self-pay | Admitting: Family Medicine

## 2017-04-16 DIAGNOSIS — Z79891 Long term (current) use of opiate analgesic: Secondary | ICD-10-CM | POA: Diagnosis not present

## 2017-04-16 DIAGNOSIS — Z79899 Other long term (current) drug therapy: Secondary | ICD-10-CM | POA: Diagnosis not present

## 2017-04-16 MED FILL — ALPRAZolam 0.25 MG TABS: 0.25 | 10 days supply | Qty: 30 | Fill #0

## 2017-04-16 MED FILL — traMADol HCL 50 MG TABS: 50 | 7 days supply | Qty: 21 | Fill #0

## 2017-05-07 ENCOUNTER — Ambulatory Visit: Payer: Self-pay | Admitting: Family Medicine

## 2017-05-11 ENCOUNTER — Encounter: Payer: BLUE CROSS/BLUE SHIELD | Admitting: Women's Health

## 2017-05-14 ENCOUNTER — Encounter: Payer: BLUE CROSS/BLUE SHIELD | Admitting: Women's Health

## 2017-06-12 DIAGNOSIS — I10 Essential (primary) hypertension: Secondary | ICD-10-CM | POA: Diagnosis not present

## 2017-06-12 DIAGNOSIS — F321 Major depressive disorder, single episode, moderate: Secondary | ICD-10-CM | POA: Diagnosis not present

## 2017-06-28 ENCOUNTER — Encounter: Payer: Self-pay | Admitting: Obstetrics & Gynecology

## 2017-06-28 ENCOUNTER — Ambulatory Visit (INDEPENDENT_AMBULATORY_CARE_PROVIDER_SITE_OTHER): Payer: BLUE CROSS/BLUE SHIELD | Admitting: Obstetrics & Gynecology

## 2017-06-28 VITALS — BP 130/86 | Ht 67.0 in | Wt 274.0 lb

## 2017-06-28 DIAGNOSIS — Z6841 Body Mass Index (BMI) 40.0 and over, adult: Secondary | ICD-10-CM | POA: Diagnosis not present

## 2017-06-28 DIAGNOSIS — Z3169 Encounter for other general counseling and advice on procreation: Secondary | ICD-10-CM

## 2017-06-28 DIAGNOSIS — Z01419 Encounter for gynecological examination (general) (routine) without abnormal findings: Secondary | ICD-10-CM | POA: Diagnosis not present

## 2017-06-28 NOTE — Addendum Note (Signed)
Addended by: Berna Spare A on: 06/28/2017 09:47 AM   Modules accepted: Orders

## 2017-06-28 NOTE — Patient Instructions (Signed)
1. Encounter for routine gynecological examination with Papanicolaou smear of cervix Normal gyn exam.  Pap reflex done.  Breasts wnl.  2. Encounter for preconception consultation Recommend PNVs.  Fitness/Healthy Nutrition and weight loss encouraged.  3. Class 3 severe obesity due to excess calories without serious comorbidity with body mass index (BMI) of 40.0 to 44.9 in adult [facility]) Low Carb/Calorie diet like [employer] recommended.  Physical activity 5 times a week.  [name], it was a pleasure to see you again today!  I will inform you of your results as soon as available.  Health Maintenance, Female Adopting a healthy lifestyle and getting preventive care can go a long way to promote health and wellness. Talk with your health care provider about what schedule of regular examinations is right for you. This is a good chance for you to check in with your provider about disease prevention and staying healthy. In between checkups, there are plenty of things you can do on your own. Experts have done a lot of research about which lifestyle changes and preventive measures are most likely to keep you healthy. Ask your health care provider for more information. Weight and diet Eat a healthy diet  Be sure to include plenty of vegetables, fruits, low-fat dairy products, and lean protein.  Do not eat a lot of foods high in solid fats, added sugars, or salt.  Get regular exercise. This is one of the most important things you can do for your health. ? Most adults should exercise for at least 150 minutes each week. The exercise should increase your heart rate and make you sweat (moderate-intensity exercise). ? Most adults should also do strengthening exercises at least twice a week. This is in addition to the moderate-intensity exercise.  Maintain a healthy weight  Body mass index (BMI) is a measurement that can be used to identify possible weight problems. It estimates body fat based on height  and weight. Your health care provider can help determine your BMI and help you achieve or maintain a healthy weight.  For females 33 years of age and older: ? A BMI below 18.5 is considered underweight. ? A BMI of 18.5 to 24.9 is normal. ? A BMI of 25 to 29.9 is considered overweight. ? A BMI of 30 and above is considered obese.  Watch levels of cholesterol and blood lipids  You should start having your blood tested for lipids and cholesterol at 36 years of age, then have this test every 5 years.  You may need to have your cholesterol levels checked more often if: ? Your lipid or cholesterol levels are high. ? You are older than 36 years of age. ? You are at high risk for heart disease.  Cancer screening Lung Cancer  Lung cancer screening is recommended for adults 45-23 years old who are at high risk for lung cancer because of a history of smoking.  A yearly low-dose CT scan of the lungs is recommended for people who: ? Currently smoke. ? Have quit within the past 15 years. ? Have at least a 30-pack-year history of smoking. A pack year is smoking an average of one pack of cigarettes a day for 1 year.  Yearly screening should continue until it has been 15 years since you quit.  Yearly screening should stop if you develop a health problem that would prevent you from having lung cancer treatment.  Breast Cancer  Practice breast self-awareness. This means understanding how your breasts normally appear and feel.  It also means doing regular breast self-exams. Let your health care provider know about any changes, no matter how small.  If you are in your 20s or 30s, you should have a clinical breast exam (CBE) by a health care provider every 1-3 years as part of a regular health exam.  If you are 31 or older, have a CBE every year. Also consider having a breast X-ray (mammogram) every year.  If you have a family history of breast cancer, talk to your health care provider about  genetic screening.  If you are at high risk for breast cancer, talk to your health care provider about having an MRI and a mammogram every year.  Breast cancer gene (BRCA) assessment is recommended for women who have family members with BRCA-related cancers. BRCA-related cancers include: ? Breast. ? Ovarian. ? Tubal. ? Peritoneal cancers.  Results of the assessment will determine the need for genetic counseling and BRCA1 and BRCA2 testing.  Cervical Cancer Your health care provider may recommend that you be screened regularly for cancer of the pelvic organs (ovaries, uterus, and vagina). This screening involves a pelvic examination, including checking for microscopic changes to the surface of your cervix (Pap test). You may be encouraged to have this screening done every 3 years, beginning at age 11.  For women ages 21-65, health care providers may recommend pelvic exams and Pap testing every 3 years, or they may recommend the Pap and pelvic exam, combined with testing for human papilloma virus (HPV), every 5 years. Some types of HPV increase your risk of cervical cancer. Testing for HPV may also be done on women of any age with unclear Pap test results.  Other health care providers may not recommend any screening for nonpregnant women who are considered low risk for pelvic cancer and who do not have symptoms. Ask your health care provider if a screening pelvic exam is right for you.  If you have had past treatment for cervical cancer or a condition that could lead to cancer, you need Pap tests and screening for cancer for at least 20 years after your treatment. If Pap tests have been discontinued, your risk factors (such as having a new sexual partner) need to be reassessed to determine if screening should resume. Some women have medical problems that increase the chance of getting cervical cancer. In these cases, your health care provider may recommend more frequent screening and Pap  tests.  Colorectal Cancer  This type of cancer can be detected and often prevented.  Routine colorectal cancer screening usually begins at 36 years of age and continues through 36 years of age.  Your health care provider may recommend screening at an earlier age if you have risk factors for colon cancer.  Your health care provider may also recommend using home test kits to check for hidden blood in the stool.  A small camera at the end of a tube can be used to examine your colon directly (sigmoidoscopy or colonoscopy). This is done to check for the earliest forms of colorectal cancer.  Routine screening usually begins at age 10.  Direct examination of the colon should be repeated every 5-10 years through 36 years of age. However, you may need to be screened more often if early forms of precancerous polyps or small growths are found.  Skin Cancer  Check your skin from head to toe regularly.  Tell your health care provider about any new moles or changes in moles, especially if there is a  change in a mole's shape or color.  Also tell your health care provider if you have a mole that is larger than the size of a pencil eraser.  Always use sunscreen. Apply sunscreen liberally and repeatedly throughout the day.  Protect yourself by wearing long sleeves, pants, a wide-brimmed hat, and sunglasses whenever you are outside.  Heart disease, diabetes, and high blood pressure  High blood pressure causes heart disease and increases the risk of stroke. High blood pressure is more likely to develop in: ? People who have blood pressure in the high end of the normal range (130-139/85-89 mm Hg). ? People who are overweight or obese. ? People who are African American.  If you are 18-39 years of age, have your blood pressure checked every 3-5 years. If you are 40 years of age or older, have your blood pressure checked every year. You should have your blood pressure measured twice-once when you are at  a hospital or clinic, and once when you are not at a hospital or clinic. Record the average of the two measurements. To check your blood pressure when you are not at a hospital or clinic, you can use: ? An automated blood pressure machine at a pharmacy. ? A home blood pressure monitor.  If you are between 55 years and 79 years old, ask your health care provider if you should take aspirin to prevent strokes.  Have regular diabetes screenings. This involves taking a blood sample to check your fasting blood sugar level. ? If you are at a normal weight and have a low risk for diabetes, have this test once every three years after 36 years of age. ? If you are overweight and have a high risk for diabetes, consider being tested at a younger age or more often. Preventing infection Hepatitis B  If you have a higher risk for hepatitis B, you should be screened for this virus. You are considered at high risk for hepatitis B if: ? You were born in a country where hepatitis B is common. Ask your health care provider which countries are considered high risk. ? Your parents were born in a high-risk country, and you have not been immunized against hepatitis B (hepatitis B vaccine). ? You have HIV or AIDS. ? You use needles to inject street drugs. ? You live with someone who has hepatitis B. ? You have had sex with someone who has hepatitis B. ? You get hemodialysis treatment. ? You take certain medicines for conditions, including cancer, organ transplantation, and autoimmune conditions.  Hepatitis C  Blood testing is recommended for: ? Everyone born from 1945 through 1965. ? Anyone with known risk factors for hepatitis C.  Sexually transmitted infections (STIs)  You should be screened for sexually transmitted infections (STIs) including gonorrhea and chlamydia if: ? You are sexually active and are younger than 36 years of age. ? You are older than 36 years of age and your health care provider tells  you that you are at risk for this type of infection. ? Your sexual activity has changed since you were last screened and you are at an increased risk for chlamydia or gonorrhea. Ask your health care provider if you are at risk.  If you do not have HIV, but are at risk, it may be recommended that you take a prescription medicine daily to prevent HIV infection. This is called pre-exposure prophylaxis (PrEP). You are considered at risk if: ? You are sexually active and do not regularly   use condoms or know the HIV status of your partner(s). ? You take drugs by injection. ? You are sexually active with a partner who has HIV.  Talk with your health care provider about whether you are at high risk of being infected with HIV. If you choose to begin PrEP, you should first be tested for HIV. You should then be tested every 3 months for as long as you are taking PrEP. Pregnancy  If you are premenopausal and you may become pregnant, ask your health care provider about preconception counseling.  If you may become pregnant, take 400 to 800 micrograms (mcg) of folic acid every day.  If you want to prevent pregnancy, talk to your health care provider about birth control (contraception). Osteoporosis and menopause  Osteoporosis is a disease in which the bones lose minerals and strength with aging. This can result in serious bone fractures. Your risk for osteoporosis can be identified using a bone density scan.  If you are 65 years of age or older, or if you are at risk for osteoporosis and fractures, ask your health care provider if you should be screened.  Ask your health care provider whether you should take a calcium or vitamin D supplement to lower your risk for osteoporosis.  Menopause may have certain physical symptoms and risks.  Hormone replacement therapy may reduce some of these symptoms and risks. Talk to your health care provider about whether hormone replacement therapy is right for  you. Follow these instructions at home:  Schedule regular health, dental, and eye exams.  Stay current with your immunizations.  Do not use any tobacco products including cigarettes, chewing tobacco, or electronic cigarettes.  If you are pregnant, do not drink alcohol.  If you are breastfeeding, limit how much and how often you drink alcohol.  Limit alcohol intake to no more than 1 drink per day for nonpregnant women. One drink equals 12 ounces of beer, 5 ounces of wine, or 1 ounces of hard liquor.  Do not use street drugs.  Do not share needles.  Ask your health care provider for help if you need support or information about quitting drugs.  Tell your health care provider if you often feel depressed.  Tell your health care provider if you have ever been abused or do not feel safe at home. This information is not intended to replace advice given to you by your health care provider. Make sure you discuss any questions you have with your health care provider. Document Released: 03/20/2011 Document Revised: 02/10/2016 Document Reviewed: 06/08/2015 Elsevier Interactive Patient Education  2018 [employer] Inc[employer]  

## 2017-06-28 NOTE — Progress Notes (Signed)
[name] 1981/08/22 [id number]   History:    36 y.o. Married.  Professor of Biology.  RP:  Established patient presenting for annual gyn exam   HPI:  Menses regular normal every month.  Irregular pelvic cramping, but no BTB.  Normal vaginal secretions.  No contraception, ok if conceives.  Breasts wnl.  BMI 42.91.  Past medical history,surgical history, family history and social history were all reviewed and documented in the EPIC chart.  Gynecologic History Patient's last menstrual period was 06/08/2017. Contraception: none Last Pap: 04/2016. Results were: Neg/HPV HR neg Last mammogram: Never  Obstetric History OB History  Gravida Para Term Preterm AB Living  0 0 0 0 0 0  SAB TAB Ectopic Multiple Live Births  0 0 0 0 0         ROS: A ROS was performed and pertinent positives and negatives are included in the history.  GENERAL: No fevers or chills. HEENT: No change in vision, no earache, sore throat or sinus congestion. NECK: No pain or stiffness. CARDIOVASCULAR: No chest pain or pressure. No palpitations. PULMONARY: No shortness of breath, cough or wheeze. GASTROINTESTINAL: No abdominal pain, nausea, vomiting or diarrhea, melena or bright red blood per rectum. GENITOURINARY: No urinary frequency, urgency, hesitancy or dysuria. MUSCULOSKELETAL: No joint or muscle pain, no back pain, no recent trauma. DERMATOLOGIC: No rash, no itching, no lesions. ENDOCRINE: No polyuria, polydipsia, no heat or cold intolerance. No recent change in weight. HEMATOLOGICAL: No anemia or easy bruising or bleeding. NEUROLOGIC: No headache, seizures, numbness, tingling or weakness. PSYCHIATRIC: No depression, no loss of interest in normal activity or change in sleep pattern.     Exam:   BP 130/86   Ht 5\' 7"  (1.702 m)   Wt 274 lb (124.3 kg)   LMP 06/08/2017 Comment: no birth control   BMI 42.91 kg/m   Body mass index is 42.91 kg/m.  General appearance : Well developed well nourished  female. No acute distress HEENT: Eyes: no retinal hemorrhage or exudates,  Neck supple, trachea midline, no carotid bruits, no thyroidmegaly Lungs: Clear to auscultation, no rhonchi or wheezes, or rib retractions  Heart: Regular rate and rhythm, no murmurs or gallops Breast:Examined in sitting and supine position were symmetrical in appearance, no palpable masses or tenderness,  no skin retraction, no nipple inversion, no nipple discharge, no skin discoloration, no axillary or supraclavicular lymphadenopathy Abdomen: no palpable masses or tenderness, no rebound or guarding Extremities: no edema or skin discoloration or tenderness  Pelvic: Vulva normal  Bartholin, Urethra, Skene Glands: Within normal limits             Vagina: No gross lesions or discharge  Cervix: No gross lesions or discharge.  Pap reflex done.  Uterus  AV, normal size, shape and consistency, non-tender and mobile  Adnexa  Without masses or tenderness  Anus and perineum  normal    Assessment/Plan:  36 y.o. female for annual exam   1. Encounter for routine gynecological examination with Papanicolaou smear of cervix Normal gyn exam.  Pap reflex done.  Breasts wnl.  2. Encounter for preconception consultation Recommend PNVs.  Fitness/Healthy Nutrition and weight loss encouraged.  3. Class 3 severe obesity due to excess calories without serious comorbidity with body mass index (BMI) of 40.0 to 44.9 in adult [facility]) Low Carb/Calorie diet like [employer] recommended.  Physical activity 5 times a week.  Counseling on above issues >50% x 10 minutes.  [name] MD, 9:01 AM  06/28/2017   

## 2017-06-29 LAB — PAP IG W/ RFLX HPV ASCU

## 2017-07-12 ENCOUNTER — Other Ambulatory Visit: Payer: Self-pay | Admitting: Family Medicine

## 2017-07-12 DIAGNOSIS — A6000 Herpesviral infection of urogenital system, unspecified: Secondary | ICD-10-CM

## 2017-07-12 MED ORDER — VALACYCLOVIR HCL 1 G PO TABS: 1000.0000 mg | ORAL_TABLET | Freq: Every day | ORAL | 1 refills | Status: DC

## 2017-07-12 MED FILL — valACYclovir HCL 1 GM TABS: 1 | 90 days supply | Qty: 90 | Fill #0

## 2017-10-15 MED FILL — valACYclovir HCL 1 GM TABS: 1 | 90 days supply | Qty: 90 | Fill #0

## 2017-11-26 ENCOUNTER — Encounter: Payer: Self-pay | Admitting: Obstetrics & Gynecology

## 2018-01-11 ENCOUNTER — Other Ambulatory Visit: Payer: Self-pay | Admitting: Family Medicine

## 2018-01-11 DIAGNOSIS — A6000 Herpesviral infection of urogenital system, unspecified: Secondary | ICD-10-CM

## 2018-01-11 DIAGNOSIS — F32A Depression, unspecified: Secondary | ICD-10-CM

## 2018-01-11 DIAGNOSIS — M549 Dorsalgia, unspecified: Secondary | ICD-10-CM

## 2018-01-11 DIAGNOSIS — F329 Major depressive disorder, single episode, unspecified: Secondary | ICD-10-CM

## 2018-01-11 DIAGNOSIS — F419 Anxiety disorder, unspecified: Secondary | ICD-10-CM

## 2018-01-11 MED FILL — valACYclovir HCL 1 GM TABS: 1 | 90 days supply | Qty: 90 | Fill #1

## 2018-01-22 ENCOUNTER — Encounter: Payer: Self-pay | Admitting: Family Medicine

## 2018-01-22 NOTE — Telephone Encounter (Signed)
Requesting:Alprazolam and Tramadol Contract:04/13/17 UDS:04/16/17 low risk-needs updated Last Visit:04/05/17 Next Visit:none with pcp Last Refill:04/13/17  Database ran and on desk for review   Please Advise

## 2018-03-04 ENCOUNTER — Encounter: Payer: Self-pay | Admitting: Obstetrics & Gynecology

## 2018-03-06 ENCOUNTER — Encounter: Payer: Self-pay | Admitting: Obstetrics & Gynecology

## 2018-03-25 DIAGNOSIS — R51 Headache: Secondary | ICD-10-CM | POA: Diagnosis not present

## 2018-03-25 DIAGNOSIS — H4423 Degenerative myopia, bilateral: Secondary | ICD-10-CM | POA: Diagnosis not present

## 2018-03-26 DIAGNOSIS — H5213 Myopia, bilateral: Secondary | ICD-10-CM | POA: Diagnosis not present

## 2018-04-09 ENCOUNTER — Encounter: Payer: Self-pay | Admitting: Family Medicine

## 2018-04-09 ENCOUNTER — Encounter: Payer: BLUE CROSS/BLUE SHIELD | Admitting: Family Medicine

## 2018-04-09 ENCOUNTER — Telehealth: Payer: Self-pay

## 2018-04-09 ENCOUNTER — Ambulatory Visit (INDEPENDENT_AMBULATORY_CARE_PROVIDER_SITE_OTHER): Payer: BLUE CROSS/BLUE SHIELD | Admitting: Family Medicine

## 2018-04-09 ENCOUNTER — Encounter: Payer: Self-pay | Admitting: Obstetrics & Gynecology

## 2018-04-09 VITALS — BP 122/79 | HR 84 | Temp 98.7°F | Resp 16 | Ht 67.0 in | Wt 284.8 lb

## 2018-04-09 DIAGNOSIS — F329 Major depressive disorder, single episode, unspecified: Secondary | ICD-10-CM

## 2018-04-09 DIAGNOSIS — F419 Anxiety disorder, unspecified: Secondary | ICD-10-CM | POA: Diagnosis not present

## 2018-04-09 DIAGNOSIS — Z79899 Other long term (current) drug therapy: Secondary | ICD-10-CM | POA: Diagnosis not present

## 2018-04-09 DIAGNOSIS — Z0000 Encounter for general adult medical examination without abnormal findings: Secondary | ICD-10-CM | POA: Diagnosis not present

## 2018-04-09 DIAGNOSIS — M549 Dorsalgia, unspecified: Secondary | ICD-10-CM | POA: Diagnosis not present

## 2018-04-09 DIAGNOSIS — F32A Depression, unspecified: Secondary | ICD-10-CM

## 2018-04-09 DIAGNOSIS — A6000 Herpesviral infection of urogenital system, unspecified: Secondary | ICD-10-CM

## 2018-04-09 MED ORDER — ALPRAZOLAM 0.25 MG PO TABS: 0.2500 mg | ORAL_TABLET | Freq: Three times a day (TID) | ORAL | 0 refills | Status: DC | PRN

## 2018-04-09 MED ORDER — CYCLOBENZAPRINE HCL 10 MG PO TABS: 10.0000 mg | ORAL_TABLET | Freq: Three times a day (TID) | ORAL | 0 refills | Status: DC | PRN

## 2018-04-09 MED ORDER — VALACYCLOVIR HCL 1 G PO TABS: 1000.0000 mg | ORAL_TABLET | Freq: Every day | ORAL | 1 refills | Status: DC

## 2018-04-09 MED ORDER — BUPROPION HCL ER (XL) 150 MG PO TB24: 150.0000 mg | ORAL_TABLET | Freq: Every day | ORAL | 0 refills | Status: DC

## 2018-04-09 MED FILL — valACYclovir HCL 1 GM TABS: 1 | 90 days supply | Qty: 90 | Fill #0

## 2018-04-09 MED FILL — CYCLOBENZAPRINE HCL 10 MG T: 10 | 10 days supply | Qty: 30 | Fill #0

## 2018-04-09 MED FILL — ALPRAZolam 0.25 MG TABS: 0.25 | 10 days supply | Qty: 30 | Fill #0

## 2018-04-09 MED FILL — buPROPion HCL ER (XL) 150 M: 150 | 34 days supply | Qty: 60 | Fill #0

## 2018-04-09 NOTE — Patient Instructions (Signed)
Preventive Care 18-39 Years, Female Preventive care refers to lifestyle choices and visits with your health care provider that can promote health and wellness. What does preventive care include?  A yearly physical exam. This is also called an annual well check.  Dental exams once or twice a year.  Routine eye exams. Ask your health care provider how often you should have your eyes checked.  Personal lifestyle choices, including: ? Daily care of your teeth and gums. ? Regular physical activity. ? Eating a healthy diet. ? Avoiding tobacco and drug use. ? Limiting alcohol use. ? Practicing safe sex. ? Taking vitamin and mineral supplements as recommended by your health care provider. What happens during an annual well check? The services and screenings done by your health care provider during your annual well check will depend on your age, overall health, lifestyle risk factors, and family history of disease. Counseling Your health care provider may ask you questions about your:  Alcohol use.  Tobacco use.  Drug use.  Emotional well-being.  Home and relationship well-being.  Sexual activity.  Eating habits.  Work and work Statistician.  Method of birth control.  Menstrual cycle.  Pregnancy history.  Screening You may have the following tests or measurements:  Height, weight, and BMI.  Diabetes screening. This is done by checking your blood sugar (glucose) after you have not eaten for a while (fasting).  Blood pressure.  Lipid and cholesterol levels. These may be checked every 5 years starting at age 66.  Skin check.  Hepatitis C blood test.  Hepatitis B blood test.  Sexually transmitted disease (STD) testing.  BRCA-related cancer screening. This may be done if you have a family history of breast, ovarian, tubal, or peritoneal cancers.  Pelvic exam and Pap test. This may be done every 3 years starting at age 40. Starting at age 59, this may be done every 5  years if you have a Pap test in combination with an HPV test.  Discuss your test results, treatment options, and if necessary, the need for more tests with your health care provider. Vaccines Your health care provider may recommend certain vaccines, such as:  Influenza vaccine. This is recommended every year.  Tetanus, diphtheria, and acellular pertussis (Tdap, Td) vaccine. You may need a Td booster every 10 years.  Varicella vaccine. You may need this if you have not been vaccinated.  HPV vaccine. If you are 69 or younger, you may need three doses over 6 months.  Measles, mumps, and rubella (MMR) vaccine. You may need at least one dose of MMR. You may also need a second dose.  Pneumococcal 13-valent conjugate (PCV13) vaccine. You may need this if you have certain conditions and were not previously vaccinated.  Pneumococcal polysaccharide (PPSV23) vaccine. You may need one or two doses if you smoke cigarettes or if you have certain conditions.  Meningococcal vaccine. One dose is recommended if you are age 27-21 years and a first-year college student living in a residence hall, or if you have one of several medical conditions. You may also need additional booster doses.  Hepatitis A vaccine. You may need this if you have certain conditions or if you travel or work in places where you may be exposed to hepatitis A.  Hepatitis B vaccine. You may need this if you have certain conditions or if you travel or work in places where you may be exposed to hepatitis B.  Haemophilus influenzae type b (Hib) vaccine. You may need this if  you have certain risk factors.  Talk to your health care provider about which screenings and vaccines you need and how often you need them. This information is not intended to replace advice given to you by your health care provider. Make sure you discuss any questions you have with your health care provider. Document Released: 10/31/2001 Document Revised: 05/24/2016  Document Reviewed: 07/06/2015 Elsevier Interactive Patient Education  [employer].

## 2018-04-09 NOTE — Progress Notes (Signed)
Subjective:     [name] is a 37 y.o. female and is here for a comprehensive physical exam. The patient reports no problems.  Social History   Socioeconomic History  . Marital status: Single    Spouse name: Not on file  . Number of children: Not on file  . Years of education: Not on file  . Highest education level: Not on file  Occupational History  . 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 Financial resource strain: Not on file  . Food insecurity:    Worry: Not on file    Inability: Not on file  . Transportation needs:    Medical: Not on file    Non-medical: Not on file  Tobacco Use  . Smoking status: Never Smoker  . Smokeless tobacco: Never Used  Substance and Sexual Activity  . Alcohol use: Yes    Comment: SOCIAL   . Drug use: No  . Sexual activity: Yes    Partners: Male    Birth control/protection: None    Comment: 1st intercourse- 46, partners- 10, current partner- 4 yrs   Lifestyle  . Physical activity:    Days per week: Not on file    Minutes per session: Not on file  . Stress: Not on file  Relationships  . Social connections:    Talks on phone: Not on file    Gets together: Not on file    Attends religious service: Not on file    Active member of club or organization: Not on file    Attends meetings of clubs or organizations: Not on file    Relationship status: Not on file  . Intimate partner violence:    Fear of current or ex partner: Not on file    Emotionally abused: Not on file    Physically abused: Not on file    Forced sexual activity: Not on file  Other Topics Concern  . Not on file  Social History Narrative  . Not on file   Health Maintenance  Topic Date Due  . INFLUENZA VACCINE  04/18/2018  . PAP SMEAR  06/28/2020  . TETANUS/TDAP  11/10/2020  . HIV Screening  Completed    The following portions of the patient's history were reviewed and updated as appropriate:  She  has a past medical history of  Anemia, Asthma, Genital herpes, Obesity, and Thyroid disease. She does not have any pertinent problems on file. She  has a past surgical history that includes Keloid excision (2008) and Salivary gland surgery. Her family history includes Cancer in her maternal grandfather and paternal grandfather; Cancer (age of onset: 51) in her father; Coronary artery disease in her unknown relative; Depression in her father; Heart disease (age of onset: 75) in her father; Hyperlipidemia in her father and mother; Hypertension in her father and mother; Prostate cancer in her unknown relative. She  reports that she has never smoked. She has never used smokeless tobacco. She reports that she drinks alcohol. She reports that she does not use drugs. She has a current medication list which includes the following prescription(s): albuterol, alprazolam, cyclobenzaprine, tramadol, valacyclovir, and bupropion. Current Outpatient Medications on File Prior to Visit  Medication Sig Dispense Refill  . albuterol (PROAIR HFA) 108 (90 Base) MCG/ACT inhaler Inhale 2 puffs into the lungs every 6 (six) hours as needed. 6.7 g 1  . traMADol (ULTRAM) 50 MG tablet Take 1 tablet (50 mg total) by mouth every 8 (  eight) hours as needed. 30 tablet 0   No current facility-administered medications on file prior to visit.    She has No Known Allergies..  Review of Systems Review of Systems  Constitutional: Negative for activity change, appetite change and fatigue.  HENT: Negative for hearing loss, congestion, tinnitus and ear discharge.  dentist q89m Eyes: Negative for visual disturbance (see optho q1y -- vision corrected to 20/20 with glasses).  Respiratory: Negative for cough, chest tightness and shortness of breath.   Cardiovascular: Negative for chest pain, palpitations and leg swelling.  Gastrointestinal: Negative for abdominal pain, diarrhea, constipation and abdominal distention.  Genitourinary: Negative for urgency, frequency,  decreased urine volume and difficulty urinating.  Musculoskeletal: Negative for back pain, arthralgias and gait problem.  Skin: Negative for color change, pallor and rash.  Neurological: Negative for dizziness, light-headedness, numbness and headaches.  Hematological: Negative for adenopathy. Does not bruise/bleed easily.  Psychiatric/Behavioral: Negative for suicidal ideas, confusion, sleep disturbance, self-injury, dysphoric mood, decreased concentration and agitation.       Objective:    BP 122/79   Pulse 84   Temp 98.7 F (37.1 C) (Oral)   Resp 16   Ht 5\' 7"  (1.702 m)   Wt 284 lb 12.8 oz (129.2 kg)   LMP 04/02/2018   SpO2 100%   BMI 44.61 kg/m  General appearance: alert, cooperative, appears stated age and no distress Head: Normocephalic, without obvious abnormality, atraumatic Eyes: negative findings: lids and lashes normal and pupils equal, round, reactive to light and accomodation Ears: normal TM's and external ear canals both ears Nose: Nares normal. Septum midline. Mucosa normal. No drainage or sinus tenderness. Throat: lips, mucosa, and tongue normal; teeth and gums normal Neck: no adenopathy, no carotid bruit, no JVD, supple, symmetrical, trachea midline and thyroid not enlarged, symmetric, no tenderness/mass/nodules Back: symmetric, no curvature. ROM normal. No CVA tenderness. Lungs: clear to auscultation bilaterally Breasts: normal appearance, no masses or tenderness Heart: regular rate and rhythm, S1, S2 normal, no murmur, click, rub or gallop Abdomen: soft, non-tender; bowel sounds normal; no masses,  no organomegaly Pelvic: deferred Extremities: extremities normal, atraumatic, no cyanosis or edema Pulses: 2+ and symmetric Skin: Skin color, texture, turgor normal. No rashes or lesions Lymph nodes: Cervical, supraclavicular, and axillary nodes normal. Neurologic: Alert and oriented X 3, normal strength and tone. Normal symmetric reflexes. Normal coordination and  gait    Assessment:    Healthy female exam.      Plan:    ghm utd Check labs  See After Visit Summary for Counseling Recommendations    1. Anxiety and depression stable - Pain Mgmt, Profile 8 w/Conf, U; Future - Pain Mgmt, Tramadol w/medMATCH, U; Future - buPROPion (WELLBUTRIN XL) 150 MG 24 hr tablet; Take 1 tablet (150 mg total) by mouth daily. After 1 week increase to 2 a day  Dispense: 60 tablet; Refill: 0 - ALPRAZolam (XANAX) 0.25 MG tablet; Take 1 tablet (0.25 mg total) by mouth 3 (three) times daily as needed for sleep.  Dispense: 30 tablet; Refill: 0  2. Back pain without radiation stable - Pain Mgmt, Profile 8 w/Conf, U; Future - Pain Mgmt, Tramadol w/medMATCH, U; Future - cyclobenzaprine (FLEXERIL) 10 MG tablet; Take 1 tablet (10 mg total) by mouth 3 (three) times daily as needed for muscle spasms.  Dispense: 30 tablet; Refill: 0  3. High risk medication use   - Pain Mgmt, Profile 8 w/Conf, U; Future - Pain Mgmt, Tramadol w/medMATCH, U; Future  4. Preventative health care  See above ssee AVS - CBC with Differential/Platelet; Future - Comprehensive metabolic panel; Future - Lipid panel; Future - TSH; Future  5. Genital herpes   - valACYclovir (VALTREX) 1000 MG tablet; Take 1 tablet (1,000 mg total) by mouth daily.  Dispense: 90 tablet; Refill: 1  6. Morbid obesity (HCC)   - Amb Ref to Medical Weight Management

## 2018-04-09 NOTE — Telephone Encounter (Signed)
PA approved.  Questionnaire submitted. PA Case [id number] Status: Approved. Authorization and Notifications Completed.

## 2018-04-09 NOTE — Telephone Encounter (Signed)
PA initiated via Covermymeds; KEY: [id number]. Awaiting determination.

## 2018-04-11 ENCOUNTER — Other Ambulatory Visit (HOSPITAL_COMMUNITY)
Admission: RE | Admit: 2018-04-11 | Discharge: 2018-04-11 | Disposition: A | Discharge status: Home / Self Care | Payer: BLUE CROSS/BLUE SHIELD | Source: Ambulatory Visit | Attending: Family Medicine | Admitting: Family Medicine

## 2018-04-11 ENCOUNTER — Other Ambulatory Visit (INDEPENDENT_AMBULATORY_CARE_PROVIDER_SITE_OTHER): Payer: BLUE CROSS/BLUE SHIELD

## 2018-04-11 DIAGNOSIS — F329 Major depressive disorder, single episode, unspecified: Secondary | ICD-10-CM

## 2018-04-11 DIAGNOSIS — Z0000 Encounter for general adult medical examination without abnormal findings: Secondary | ICD-10-CM

## 2018-04-11 DIAGNOSIS — Z113 Encounter for screening for infections with a predominantly sexual mode of transmission: Secondary | ICD-10-CM

## 2018-04-11 DIAGNOSIS — Z79899 Other long term (current) drug therapy: Secondary | ICD-10-CM

## 2018-04-11 DIAGNOSIS — F419 Anxiety disorder, unspecified: Secondary | ICD-10-CM | POA: Insufficient documentation

## 2018-04-11 DIAGNOSIS — M549 Dorsalgia, unspecified: Secondary | ICD-10-CM | POA: Insufficient documentation

## 2018-04-11 DIAGNOSIS — F32A Depression, unspecified: Secondary | ICD-10-CM

## 2018-04-11 LAB — LIPID PANEL
Cholesterol: 149 mg/dL (ref 0–200)
HDL: 37.7 mg/dL — ABNORMAL LOW (ref >39.00)
LDL Cholesterol: 96 mg/dL (ref 0–99)
NonHDL: 110.81
Total CHOL/HDL Ratio: 4
Triglycerides: 76 mg/dL (ref 0.0–149.0)
VLDL: 15.2 mg/dL (ref 0.0–40.0)

## 2018-04-11 LAB — COMPREHENSIVE METABOLIC PANEL
ALT: 12 U/L (ref 0–35)
AST: 11 U/L (ref 0–37)
Albumin: 3.6 g/dL (ref 3.5–5.2)
Alkaline Phosphatase: 143 U/L — ABNORMAL HIGH (ref 39–117)
BUN: 8 mg/dL (ref 6–23)
CO2: 29 mEq/L (ref 19–32)
Calcium: 9.2 mg/dL (ref 8.4–10.5)
Chloride: 102 mEq/L (ref 96–112)
Creatinine, Ser: 0.71 mg/dL (ref 0.40–1.20)
GFR: 119.16 mL/min (ref >60.00)
Glucose, Bld: 126 mg/dL — ABNORMAL HIGH (ref 70–99)
Potassium: 3.8 mEq/L (ref 3.5–5.1)
Sodium: 138 mEq/L (ref 135–145)
Total Bilirubin: 0.7 mg/dL (ref 0.2–1.2)
Total Protein: 7.3 g/dL (ref 6.0–8.3)

## 2018-04-11 LAB — CBC WITH DIFFERENTIAL/PLATELET
Basophils Absolute: 0.1 10*3/uL (ref 0.0–0.1)
Basophils Relative: 0.4 % (ref 0.0–3.0)
Eosinophils Absolute: 0.2 10*3/uL (ref 0.0–0.7)
Eosinophils Relative: 1.6 % (ref 0.0–5.0)
HCT: 36.8 % (ref 36.0–46.0)
Hemoglobin: 11.4 g/dL — ABNORMAL LOW (ref 12.0–15.0)
Lymphocytes Relative: 21.8 % (ref 12.0–46.0)
Lymphs Abs: 3 10*3/uL (ref 0.7–4.0)
MCHC: 31.1 g/dL (ref 30.0–36.0)
MCV: 72.1 fl — ABNORMAL LOW (ref 78.0–100.0)
Monocytes Absolute: 0.7 10*3/uL (ref 0.1–1.0)
Monocytes Relative: 5 % (ref 3.0–12.0)
Neutro Abs: 9.8 10*3/uL — ABNORMAL HIGH (ref 1.4–7.7)
Neutrophils Relative %: 71.2 % (ref 43.0–77.0)
Platelets: 332 10*3/uL (ref 150.0–400.0)
RBC: 5.1 Mil/uL (ref 3.87–5.11)
RDW: 16.1 % — ABNORMAL HIGH (ref 11.5–15.5)
WBC: 13.8 10*3/uL — ABNORMAL HIGH (ref 4.0–10.5)

## 2018-04-11 LAB — TSH: TSH: 1.5 u[IU]/mL (ref 0.35–4.50)

## 2018-04-11 NOTE — Addendum Note (Signed)
Addended by: Thelma Barge D on: 04/11/2018 12:10 PM   Modules accepted: Orders

## 2018-04-12 LAB — URINE CYTOLOGY ANCILLARY ONLY
Chlamydia: NEGATIVE
Neisseria Gonorrhea: NEGATIVE
Trichomonas: NEGATIVE

## 2018-04-12 LAB — RPR: RPR Ser Ql: NONREACTIVE

## 2018-04-12 LAB — HIV ANTIBODY (ROUTINE TESTING W REFLEX): HIV 1&2 Ab, 4th Generation: NONREACTIVE

## 2018-04-14 LAB — PAIN MGMT, PROFILE 8 W/CONF, U
6 Acetylmorphine: NEGATIVE ng/mL (ref <10)
Alcohol Metabolites: NEGATIVE ng/mL (ref <500)
Amphetamines: NEGATIVE ng/mL (ref <500)
Benzodiazepines: NEGATIVE ng/mL (ref <100)
Buprenorphine, Urine: NEGATIVE ng/mL (ref <5)
Cocaine Metabolite: NEGATIVE ng/mL (ref <150)
Creatinine: 131.7 mg/dL
MDMA: NEGATIVE ng/mL (ref <500)
Marijuana Metabolite: NEGATIVE ng/mL (ref <20)
Opiates: NEGATIVE ng/mL (ref <100)
Oxidant: NEGATIVE ug/mL (ref <200)
Oxycodone: NEGATIVE ng/mL (ref <100)
pH: 7.12 (ref 4.5–9.0)

## 2018-04-14 LAB — PAIN MGMT, TRAMADOL W/MEDMATCH, U
Desmethyltramadol: NEGATIVE ng/mL (ref <100)
Tramadol: NEGATIVE ng/mL (ref <100)

## 2018-04-15 ENCOUNTER — Other Ambulatory Visit: Payer: Self-pay | Admitting: Family Medicine

## 2018-04-15 ENCOUNTER — Encounter: Payer: Self-pay | Admitting: Family Medicine

## 2018-04-15 DIAGNOSIS — N760 Acute vaginitis: Secondary | ICD-10-CM

## 2018-04-15 DIAGNOSIS — B9689 Other specified bacterial agents as the cause of diseases classified elsewhere: Secondary | ICD-10-CM

## 2018-04-15 LAB — URINE CYTOLOGY ANCILLARY ONLY
Bacterial vaginitis: POSITIVE — AB
Candida vaginitis: NEGATIVE

## 2018-04-15 MED ORDER — METRONIDAZOLE 500 MG PO TABS: 500.0000 mg | ORAL_TABLET | Freq: Two times a day (BID) | ORAL | 0 refills | Status: DC

## 2018-04-16 MED FILL — metroNIDAZOLE 500 MG TABS: 500 | 7 days supply | Qty: 14 | Fill #0

## 2018-04-16 NOTE — Telephone Encounter (Signed)
As stated in the lab note --- I will repeat the cmp with hgba1c after the flagyl

## 2018-04-18 ENCOUNTER — Encounter: Payer: Self-pay | Admitting: Family Medicine

## 2018-04-19 NOTE — Telephone Encounter (Signed)
Is she talking about the flagyl-?  I can change it to cream to be inserted if she would like

## 2018-05-02 DIAGNOSIS — Z3169 Encounter for other general counseling and advice on procreation: Secondary | ICD-10-CM

## 2018-05-03 NOTE — Telephone Encounter (Signed)
Author phoned pt. per Dr. Ernst Spell request to clarify that while pt. does not have a diagnosis of diabetes as of right now, we will continue to monitor and encouraged pt. to continue cutting back on sugar in diet. Hgba1c already ordered by Dr. Seymour Bars, GYN, today. Pt. Stated she will get that labwork done on the 21st day of her cycle, which she did not know when that would be. Dr. Laury Axon made aware.

## 2018-05-22 ENCOUNTER — Other Ambulatory Visit: Payer: BLUE CROSS/BLUE SHIELD

## 2018-05-22 DIAGNOSIS — Z3169 Encounter for other general counseling and advice on procreation: Secondary | ICD-10-CM

## 2018-05-23 LAB — PROGESTERONE: Progesterone: 8.1 ng/mL

## 2018-05-23 LAB — HEMOGLOBIN A1C
Hgb A1c MFr Bld: 6.4 % of total Hgb — ABNORMAL HIGH (ref <5.7)
Mean Plasma Glucose: 137 (calc)
eAG (mmol/L): 7.6 (calc)

## 2018-05-23 LAB — PROLACTIN: Prolactin: 8.3 ng/mL

## 2018-05-23 LAB — TSH: TSH: 1.71 mIU/L

## 2018-05-29 ENCOUNTER — Encounter: Payer: Self-pay | Admitting: Family Medicine

## 2018-06-05 ENCOUNTER — Encounter: Payer: Self-pay | Admitting: Obstetrics & Gynecology

## 2018-06-05 ENCOUNTER — Ambulatory Visit: Payer: BLUE CROSS/BLUE SHIELD | Admitting: Obstetrics & Gynecology

## 2018-06-05 VITALS — BP 124/80

## 2018-06-05 DIAGNOSIS — R7303 Prediabetes: Secondary | ICD-10-CM

## 2018-06-05 DIAGNOSIS — N979 Female infertility, unspecified: Secondary | ICD-10-CM

## 2018-06-05 MED ORDER — METFORMIN HCL ER 500 MG PO TB24: 500.0000 mg | ORAL_TABLET | Freq: Every day | ORAL | 3 refills | Status: DC

## 2018-06-05 MED FILL — METFORMIN HCL ER 500 MG TAB: 500 | 90 days supply | Qty: 90 | Fill #0

## 2018-06-05 NOTE — Progress Notes (Signed)
    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  LH positive and timed IC every month.  No pelvic pain.  Husband with no paternity, healthy, no previous investigation.   OB History  Gravida Para Term Preterm AB Living  0 0 0 0 0 0  SAB TAB Ectopic Multiple Live Births  0 0 0 0 0    Past medical history,surgical history, problem list, medications, allergies, family history and social history were all reviewed and documented in the EPIC chart.   Directed ROS with pertinent positives and negatives documented in the history of present illness/assessment and plan.  Exam:  Vitals:   06/05/18 1224  BP: 124/80   General appearance:  Normal  Deferred  Labs:  HBA1C 6.4            Fasting Glucose 137            Progesterone day # 21 at 8.1            Prolactin 8.3            TSH 1.71   Assessment/Plan:  37 y.o. G0  1. Primary female infertility Will proceed with Sperm Analysis and HSG under Doxy.  Start on Metformin XR 500 mg daily.  If the sperm analysis is good and the tubes are open, will add Clomid to stimulate ovulation.  While on Clomid, could try to conceive naturally for up to 3 months or add Intrauterine Insemination with Sperm wash.  2. Prediabetes Needs to start on a low sugar/low carb DM diet.  Regular physical activity strongly recommended.  Starting on Metformin XR 500 mg daily.  Usage reviewed with patient and prescription sent to pharmacy.  Other orders - metFORMIN (GLUCOPHAGE-XR) 500 MG 24 hr tablet; Take 1 tablet (500 mg total) by mouth daily with breakfast.  Counseling on above issues and coordination of care more than 50% for 25 minutes.  [name] MD, 12:42 PM 06/05/2018

## 2018-06-06 ENCOUNTER — Encounter: Payer: Self-pay | Admitting: *Deleted

## 2018-06-06 ENCOUNTER — Telehealth: Payer: Self-pay | Admitting: *Deleted

## 2018-06-06 DIAGNOSIS — N979 Female infertility, unspecified: Secondary | ICD-10-CM

## 2018-06-06 NOTE — Telephone Encounter (Signed)
Order faxed to Dr. April Manson office for semen analysis, patient will call on day 1 of cycle to schedule HSG. Sent my chart message relaying the information to patient.

## 2018-06-06 NOTE — Telephone Encounter (Signed)
-----   Message from [name], MD sent at 06/05/2018 12:55 PM EDT ----- Regarding: Fertility Please schedule a Sperm Analysis and Hysterosalpingography under Doxy day before/of/after.

## 2018-06-08 ENCOUNTER — Encounter: Payer: Self-pay | Admitting: Obstetrics & Gynecology

## 2018-06-08 NOTE — Patient Instructions (Signed)
1. Primary female infertility Will proceed with Sperm Analysis and HSG under Doxy.  Start on Metformin XR 500 mg daily.  If the sperm analysis is good and the tubes are open, will add Clomid to stimulate ovulation.  While on Clomid, could try to conceive naturally for up to 3 months or add Intrauterine Insemination with Sperm wash.  2. Prediabetes Needs to start on a low sugar/low carb DM diet.  Regular physical activity strongly recommended.  Starting on Metformin XR 500 mg daily.  Usage reviewed with patient and prescription sent to pharmacy.  Other orders - metFORMIN (GLUCOPHAGE-XR) 500 MG 24 hr tablet; Take 1 tablet (500 mg total) by mouth daily with breakfast.  [name], it was a pleasure seeing you today!  I will inform you of the results as soon as they are available.

## 2018-06-10 NOTE — Telephone Encounter (Signed)
Preventative health exam 7/23. Pt. Questioning why she was billed. Routed to [place] and [place], Dispensing optician to address.

## 2018-06-27 ENCOUNTER — Encounter: Payer: Self-pay | Admitting: *Deleted

## 2018-06-27 MED ORDER — DOXYCYCLINE HYCLATE 100 MG PO CAPS: ORAL_CAPSULE | ORAL | 0 refills | Status: DC

## 2018-06-27 MED FILL — DOXYCYCLINE HYCLATE 100 MG: 100 | 3 days supply | Qty: 6 | Fill #0

## 2018-06-27 NOTE — Telephone Encounter (Signed)
Patient called LMP:06/25/18, scheduled on 07/01/18 @ 1:45pm at [facility] hospital. Rx sent for doxycyline 100 mg BID, # 6 starting day before procedure. Patient asked what the out of pocket cost would be and I told her $56  Sent my chart message

## 2018-06-28 ENCOUNTER — Encounter: Payer: Self-pay | Admitting: Anesthesiology

## 2018-06-30 ENCOUNTER — Other Ambulatory Visit: Payer: Self-pay | Admitting: Obstetrics & Gynecology

## 2018-06-30 DIAGNOSIS — N979 Female infertility, unspecified: Secondary | ICD-10-CM

## 2018-07-01 ENCOUNTER — Ambulatory Visit (HOSPITAL_COMMUNITY)
Admission: RE | Admit: 2018-07-01 | Discharge: 2018-07-01 | Disposition: A | Discharge status: Home / Self Care | Payer: BLUE CROSS/BLUE SHIELD | Source: Ambulatory Visit | Attending: Obstetrics & Gynecology | Admitting: Obstetrics & Gynecology

## 2018-07-01 DIAGNOSIS — N979 Female infertility, unspecified: Secondary | ICD-10-CM | POA: Diagnosis not present

## 2018-07-01 MED ORDER — IOPAMIDOL (ISOVUE-300) INJECTION 61%
50.0000 mL | Freq: Once | INTRAVENOUS | Status: AC | PRN
  Administered 2018-07-01: 6 mL

## 2018-07-02 ENCOUNTER — Other Ambulatory Visit: Payer: Self-pay | Admitting: Obstetrics & Gynecology

## 2018-07-02 DIAGNOSIS — N840 Polyp of corpus uteri: Secondary | ICD-10-CM

## 2018-07-12 ENCOUNTER — Encounter: Payer: BLUE CROSS/BLUE SHIELD | Admitting: Obstetrics & Gynecology

## 2018-07-12 MED FILL — valACYclovir HCL 1 GM TABS: 1 | 90 days supply | Qty: 90 | Fill #1

## 2018-07-19 ENCOUNTER — Encounter: Payer: Self-pay | Admitting: Obstetrics & Gynecology

## 2018-07-19 ENCOUNTER — Ambulatory Visit (INDEPENDENT_AMBULATORY_CARE_PROVIDER_SITE_OTHER): Payer: BLUE CROSS/BLUE SHIELD | Admitting: Obstetrics & Gynecology

## 2018-07-19 VITALS — BP 124/78 | Ht 67.0 in | Wt 283.0 lb

## 2018-07-19 DIAGNOSIS — N979 Female infertility, unspecified: Secondary | ICD-10-CM | POA: Diagnosis not present

## 2018-07-19 DIAGNOSIS — Z6841 Body Mass Index (BMI) 40.0 and over, adult: Secondary | ICD-10-CM

## 2018-07-19 DIAGNOSIS — Z01419 Encounter for gynecological examination (general) (routine) without abnormal findings: Secondary | ICD-10-CM | POA: Diagnosis not present

## 2018-07-19 DIAGNOSIS — N9489 Other specified conditions associated with female genital organs and menstrual cycle: Secondary | ICD-10-CM | POA: Diagnosis not present

## 2018-07-19 NOTE — Progress Notes (Signed)
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  No breakthrough bleeding.  No pelvic pain.  No contraception as patient has primary infertility and is currently attempting conception.  Primary infertility under investigation with recent evaluation of ovulation with labs showing a progesterone day 21 at 8.1, hysterosalpingography showing patent tubes, but 2 intrauterine lesions and a sperm analysis that was abnormal.  Breasts normal.  Body mass index 44.32.  Health labs with family physician.  Past medical history,surgical history, family history and social history were all reviewed and documented in the EPIC chart.  Gynecologic History Patient's last menstrual period was 06/25/2018. Contraception: none Last Pap: 06/2017. Results were: Negative Last mammogram: 09/2012. Results were: Benign Bone Density: Never Colonoscopy: Never  Obstetric History OB History  Gravida Para Term Preterm AB Living  0 0 0 0 0 0  SAB TAB Ectopic Multiple Live Births  0 0 0 0 0     ROS: A ROS was performed and pertinent positives and negatives are included in the history.  GENERAL: No fevers or chills. HEENT: No change in vision, no earache, sore throat or sinus congestion. NECK: No pain or stiffness. CARDIOVASCULAR: No chest pain or pressure. No palpitations. PULMONARY: No shortness of breath, cough or wheeze. GASTROINTESTINAL: No abdominal pain, nausea, vomiting or diarrhea, melena or bright red blood per rectum. GENITOURINARY: No urinary frequency, urgency, hesitancy or dysuria. MUSCULOSKELETAL: No joint or muscle pain, no back pain, no recent trauma. DERMATOLOGIC: No rash, no itching, no lesions. ENDOCRINE: No polyuria, polydipsia, no heat or cold intolerance. No recent change in weight. HEMATOLOGICAL: No anemia or easy bruising or bleeding.  NEUROLOGIC: No headache, seizures, numbness, tingling or weakness. PSYCHIATRIC: No depression, no loss of interest in normal activity or change in sleep pattern.     Exam:   BP 124/78 (BP Location: Right Arm, Patient Position: Sitting, Cuff Size: Large)   Ht 5\' 7"  (1.702 m)   Wt 283 lb (128.4 kg)   LMP 06/25/2018   BMI 44.32 kg/m   Body mass index is 44.32 kg/m.  General appearance : Well developed well nourished female. No acute distress HEENT: Eyes: no retinal hemorrhage or exudates,  Neck supple, trachea midline, no carotid bruits, no thyroidmegaly Lungs: Clear to auscultation, no rhonchi or wheezes, or rib retractions  Heart: Regular rate and rhythm, no murmurs or gallops Breast:Examined in sitting and supine position were symmetrical in appearance, no palpable masses or tenderness,  no skin retraction, no nipple inversion, no nipple discharge, no skin discoloration, no axillary or supraclavicular lymphadenopathy Abdomen: no palpable masses or tenderness, no rebound or guarding Extremities: no edema or skin discoloration or tenderness  Pelvic: Vulva: Normal             Vagina: No gross lesions or discharge  Cervix: No gross lesions or discharge  Uterus  AV, normal size, shape and consistency, non-tender and mobile  Adnexa  Without masses or tenderness  Anus: Normal   Assessment/Plan:  37 y.o. female for annual exam   1. Well female exam with routine gynecological exam Normal gynecologic exam.  Pap test October 2018 was negative.  Will repeat at 2 to 3 years.  Breast exam normal.  Will start screening mammograms at age 29.  Health labs with family physician.  2. Primary female infertility 37 year old G0.  Primary infertility with regular menstrual cycles.  TSH and prolactin normal September 2019.  Hemoglobin A1c was increased at 6.4.  Day 21 progesterone was borderline at 8.1.  Abnormal sperm analysis with decreased numbers, decreased efficient motility.  Refer husband to  Urologist.  Recent hysterosalpingography demonstrated bilateral patent tubes, but to intrauterine lesions were present compatible with either polyps or fibroids.  Will proceed with a sonohysterogram to investigate.  3. Endometrial mass To intra-uterine lesion seen on hysterosalpingography compatible with polyps or fibroids.  Will investigate further with a sonohysterogram. - [place] Sonohysterogram; Future  4. Class 3 severe obesity due to excess calories without serious comorbidity with body mass index (BMI) of 40.0 to 44.9 in adult [facility] Lower calorie/carb diet such as [employer] recommended.  Given a hemoglobin A1c at 6.4, recommend a diabetic mellitus diet, that is a low sugar diet.  Aerobic physical activities 5 times a week and weightlifting every 2 days discussed.  Counseling on above issues and coordination of care more than 50% for 10 minutes.  [name] MD, 12:52 PM 07/19/2018

## 2018-07-21 ENCOUNTER — Encounter: Payer: Self-pay | Admitting: Obstetrics & Gynecology

## 2018-07-21 NOTE — Patient Instructions (Signed)
1. Well female exam with routine gynecological exam Normal gynecologic exam.  Pap test October 2018 was negative.  Will repeat at 2 to 3 years.  Breast exam normal.  Will start screening mammograms at age 37.  Health labs with family physician.  2. Primary female infertility 37 year old G0.  Primary infertility with regular menstrual cycles.  TSH and prolactin normal September 2019.  Hemoglobin A1c was increased at 6.4.  Day 21 progesterone was borderline at 8.1.  Abnormal sperm analysis with decreased numbers, decreased efficient motility.  Refer husband to Urologist.  Recent hysterosalpingography demonstrated bilateral patent tubes, but to intrauterine lesions were present compatible with either polyps or fibroids.  Will proceed with a sonohysterogram to investigate.  3. Endometrial mass To intra-uterine lesion seen on hysterosalpingography compatible with polyps or fibroids.  Will investigate further with a sonohysterogram. - [place] Sonohysterogram; Future  4. Class 3 severe obesity due to excess calories without serious comorbidity with body mass index (BMI) of 40.0 to 44.9 in adult [facility]) Lower calorie/carb diet such as [employer] recommended.  Given a hemoglobin A1c at 6.4, recommend a diabetic mellitus diet, that is a low sugar diet.  Aerobic physical activities 5 times a week and weightlifting every 2 days discussed.  [name], it was a pleasure seeing you today!

## 2018-07-23 ENCOUNTER — Other Ambulatory Visit: Payer: Self-pay | Admitting: Obstetrics & Gynecology

## 2018-07-23 DIAGNOSIS — N9489 Other specified conditions associated with female genital organs and menstrual cycle: Secondary | ICD-10-CM

## 2018-07-30 ENCOUNTER — Ambulatory Visit (INDEPENDENT_AMBULATORY_CARE_PROVIDER_SITE_OTHER): Payer: BLUE CROSS/BLUE SHIELD

## 2018-07-30 ENCOUNTER — Ambulatory Visit (INDEPENDENT_AMBULATORY_CARE_PROVIDER_SITE_OTHER): Payer: BLUE CROSS/BLUE SHIELD | Admitting: Obstetrics & Gynecology

## 2018-07-30 DIAGNOSIS — N840 Polyp of corpus uteri: Secondary | ICD-10-CM

## 2018-07-30 DIAGNOSIS — N979 Female infertility, unspecified: Secondary | ICD-10-CM

## 2018-07-30 DIAGNOSIS — N9489 Other specified conditions associated with female genital organs and menstrual cycle: Secondary | ICD-10-CM

## 2018-07-30 NOTE — Progress Notes (Signed)
    [name] 03/03/1981 [id number]        37 y.o.  G0 Married.  Professor of Biology  RP: 2 IU lesions per HSG for Sonohysterogram  HPI: Primary infertility.  HSG 07/01/2018 Patent tubes bilaterally.  2 small defects seen in the IU cavity.   OB History  Gravida Para Term Preterm AB Living  0 0 0 0 0 0  SAB TAB Ectopic Multiple Live Births  0 0 0 0 0    Past medical history,surgical history, problem list, medications, allergies, family history and social history were all reviewed and documented in the EPIC chart.   Directed ROS with pertinent positives and negatives documented in the history of present illness/assessment and plan.  Exam:  There were no vitals filed for this visit. General appearance:  Normal                                                                    Sono Infusion Hysterogram ( procedure note)   The initial transvaginal ultrasound demonstrated the following: T/V and T/A images.  Retroverted uterus measuring 7.77 x 6.4 x 4.55 cm.  Prominent endometrium measuring 14.1 mm with positive color flow Doppler but no defect seen.  Right ovary normal.  Left ovary with a thin-walled cyst measuring 3.2 x 3.1 x 3.3 cm containing internal low-level echoes with a reticular echo pattern and negative color flow Doppler.  Bilateral increase in the ovarian volume.  Small amount of free fluid in the posterior cul-de-sac measuring 4.9 x 2.9 cm.  The speculum  was inserted and the cervix cleansed with Betadine solution after confirming that patient has no allergies.A small sonohysterography catheterwas utilized.  Insertion was facilitated with ring forceps, using a spear-like motion the catheter was inserted to the fundus of the uterus. The speculum is then removed carefully to avoid dislodging the catheter. The catheter was flushed with sterile saline delete prior to insertion to rid it of small amounts of air.the sterile saline solution was infused into the uterine  cavity as a vaginal ultrasound probe was then placed in the vagina for full visualization of the uterine cavity from a transvaginal approach. The following was noted: Following injection of saline the endometrium is filled with 2 defects seen measuring 1.1 x 0.9 cm and 1 cm x 1 cm.  The catheter was then removed after retrieving some of the saline from the intrauterine cavity. An endometrial biopsy was not done. Patient tolerated procedure well. She had received a tablet of Aleve for discomfort.    Assessment/Plan:  37 y.o. G0P0000   1. 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  Will proceed with excision.  2. Endometrial polyp Sonohysterogram confirming 2 Intra-uterine lesions c/w Endometrial Polyps.  Scheduled Hysteroscopy with Myosure Excision and D+C.  Information on procedure discussed.  Pamphlet given.  F/U Preop.  Counseling on above issues and coordination of care more than 50% for 15 minutes.  [name] MD, 3:50 PM 07/30/2018

## 2018-07-31 ENCOUNTER — Telehealth: Payer: Self-pay

## 2018-07-31 NOTE — Telephone Encounter (Signed)
Spoke with patient about scheduling surgery. We discussed her insurance benefits and the fact that she has a calendar year plan and deductible/co-ins. Will start over with the new year.  We discussed her surgery prepymt amount that will be due by one week prior to surgery.  We discussed next available date is 12/20.  She wants to consider and call me by Monday morning and let me know. I told her if I do not hear from her by Monday morning I will open the time back up. She agreed.

## 2018-08-01 NOTE — Telephone Encounter (Signed)
Patient called and has decided she would like to schedule for 09/06/18 at 10:00am at [facility]. Surgery scheduled. I will mail her pamphlet and financial letter. Reminded her about surgery prepymt and that is just for Dr. Sharol Roussel fees not anyone else. She understood.

## 2018-08-01 NOTE — Telephone Encounter (Signed)
I called patient back to let her know that upon typing her financial letter I realized that I misquoted the allowable amount her insurance charge. The [employer] allowable is more. I told her the amounts and told her, per Scherry Ran., that we will only ask her for what I quoted in advance and that she may owe a little more to [employer] once the claims are filed but we will collect that post surgery. She understood and said that was fine.

## 2018-08-04 ENCOUNTER — Encounter: Payer: Self-pay | Admitting: Obstetrics & Gynecology

## 2018-08-04 NOTE — Patient Instructions (Signed)
1. 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  Will proceed with excision.  2. Endometrial polyp Sonohysterogram confirming 2 Intra-uterine lesions c/w Endometrial Polyps.  Scheduled Hysteroscopy with Myosure Excision and D+C.  Information on procedure discussed.  Pamphlet given.  F/U Preop.  [name], good seeing you today!

## 2018-08-05 ENCOUNTER — Encounter: Payer: Self-pay | Admitting: Anesthesiology

## 2018-08-22 DIAGNOSIS — L910 Hypertrophic scar: Secondary | ICD-10-CM | POA: Diagnosis not present

## 2018-09-04 ENCOUNTER — Telehealth: Payer: Self-pay

## 2018-09-04 ENCOUNTER — Encounter (HOSPITAL_COMMUNITY): Payer: Self-pay | Admitting: *Deleted

## 2018-09-04 NOTE — Telephone Encounter (Signed)
I called patient to let her know that her surgery on 09/24/18 has been moved from [facility] to [facility] OR due to construction.

## 2018-09-17 NOTE — Pre-Procedure Instructions (Signed)
[name]  09/17/2018      [facility] - [place], [place] - [address] [place] [address] Phone: [phone] Fax: [phone]    Your procedure is scheduled on Tues., Jan. 7, 2020 from 9:20AM-10:20AM  Report to [facility] Entrance at 7:50AM  Call this number if you have problems the morning of surgery:  [phone]   Remember:  Do not eat or drink after midnight on Jan. 6th    Take these medicines the morning of surgery with A SIP OF WATER: ValACYclovir (VALTREX)   If needed: Cyclobenzaprine (FLEXERIL) and Albuterol Inhaler- bring with you the day of surgery  As of today, stop taking all Other Aspirin Products, Vitamins, Fish oils, and Herbal medications. Also stop all NSAIDS i.e. Advil, Ibuprofen, Motrin, Aleve, Anaprox, Naproxen, BC, Goody Powders, and all Supplements.   WHAT DO I DO ABOUT MY DIABETES MEDICATION?  [name] Do not take MetFORMIN (GLUCOPHAGE-XR) the morning of surgery.  How to Manage Your Diabetes Before and After Surgery  Why is it important to control my blood sugar before and after surgery? . Improving blood sugar levels before and after surgery helps healing and can limit problems. . A way of improving blood sugar control is eating a healthy diet by: o  Eating less sugar and carbohydrates o  Increasing activity/exercise o  Talking with your doctor about reaching your blood sugar goals . High blood sugars (greater than 180 mg/dL) can raise your risk of infections and slow your recovery, so you will need to focus on controlling your diabetes during the weeks before surgery. . Make sure that the doctor who takes care of your diabetes knows about your planned surgery including the date and location.  How do I manage my blood sugar before surgery? . Check your blood sugar at least 4 times a day, starting 2 days before surgery, to make sure that the level is not too high or  low. o Check your blood sugar the morning of your surgery when you wake up and every 2 hours until you get to the Short Stay unit. . If your blood sugar is less than 70 mg/dL, you will need to treat for low blood sugar: o Do not take insulin. o Treat a low blood sugar (less than 70 mg/dL) with  cup of clear juice (cranberry or apple), 4 glucose tablets, OR glucose gel. Recheck blood sugar in 15 minutes after treatment (to make sure it is greater than 70 mg/dL). If your blood sugar is not greater than 70 mg/dL on recheck, call [phone] o  for further instructions. .  If your CBG is greater than 220 mg/dL, inform the staff upon arrival to [facility]  . If you are admitted to the hospital after surgery: o Your blood sugar will be checked by the staff and you will probably be given insulin after surgery (instead of oral diabetes medicines) to make sure you have good blood sugar levels. o The goal for blood sugar control after surgery is 80-180 mg/dL  Reviewed and Endorsed by [name] Patient Education Committee, August 2015    Do not wear jewelry, make-up or nail polish.  Do not wear lotions, powders, or perfumes. You may wear deodorant.  Do not shave 48 hours prior to surgery.    Do not bring valuables to the hospital.  [facility] is not responsible for any belongings or valuables.  Contacts, dentures  or bridgework may not be worn into surgery.  Leave your suitcase in the car.  After surgery it may be brought to your room.  For patients admitted to the hospital, discharge time will be at 11:00AM on the day of discharge.  Patients discharged the day of surgery will not be allowed to drive home.   Special instructions:  [facility]- Preparing For Surgery  Before surgery, you can play an important role. Because skin is not sterile, your skin needs to be as free of germs as possible. You can reduce the number of germs on your skin by washing with CHG (chlorahexidine gluconate)  Soap before surgery.  CHG is an antiseptic cleaner which kills germs and bonds with the skin to continue killing germs even after washing.    Oral Hygiene is also important to reduce your risk of infection.  Remember - BRUSH YOUR TEETH THE MORNING OF SURGERY WITH YOUR REGULAR TOOTHPASTE  Please do not use if you have an allergy to CHG or antibacterial soaps. If your skin becomes reddened/irritated stop using the CHG.  Do not shave (including legs and underarms) for at least 48 hours prior to first CHG shower. It is OK to shave your face.  Please follow these instructions carefully.   1. Shower the NIGHT BEFORE SURGERY and the MORNING OF SURGERY with CHG.   2. If you chose to wash your hair, wash your hair first as usual with your normal shampoo.  3. After you shampoo, rinse your hair and body thoroughly to remove the shampoo.  4. Use CHG as you would any other liquid soap. You can apply CHG directly to the skin and wash gently with a scrungie or a clean washcloth.   5. Apply the CHG Soap to your body ONLY FROM THE NECK DOWN.  Do not use on open wounds or open sores. Avoid contact with your eyes, ears, mouth and genitals (private parts). Wash Face and genitals (private parts)  with your normal soap.  6. Wash thoroughly, paying special attention to the area where your surgery will be performed.  7. Thoroughly rinse your body with warm water from the neck down.  8. DO NOT shower/wash with your normal soap after using and rinsing off the CHG Soap.  9. Pat yourself dry with a CLEAN TOWEL.  10. Wear CLEAN PAJAMAS to bed the night before surgery, wear comfortable clothes the morning of surgery  11. Place CLEAN SHEETS on your bed the night of your first shower and DO NOT SLEEP WITH PETS.  Day of Surgery:  Do not apply any lotions.  Please wear clean clothes to the hospital/surgery center.   Remember to brush your teeth WITH YOUR REGULAR TOOTHPASTE.  Please read over the following fact  sheets that you were given. Pain Booklet, Coughing and Deep Breathing and Surgical Site Infection Prevention

## 2018-09-19 ENCOUNTER — Encounter (HOSPITAL_COMMUNITY)
Admission: RE | Admit: 2018-09-19 | Discharge: 2018-09-19 | Disposition: A | Discharge status: Home / Self Care | Payer: BLUE CROSS/BLUE SHIELD | Source: Ambulatory Visit | Attending: Obstetrics & Gynecology | Admitting: Obstetrics & Gynecology

## 2018-09-19 ENCOUNTER — Encounter (HOSPITAL_COMMUNITY): Payer: Self-pay

## 2018-09-19 ENCOUNTER — Other Ambulatory Visit: Payer: Self-pay

## 2018-09-19 DIAGNOSIS — Z01818 Encounter for other preprocedural examination: Secondary | ICD-10-CM | POA: Diagnosis not present

## 2018-09-19 LAB — BASIC METABOLIC PANEL
Anion gap: 7 (ref 5–15)
BUN: 5 mg/dL — ABNORMAL LOW (ref 6–20)
CO2: 25 mmol/L (ref 22–32)
Calcium: 9.1 mg/dL (ref 8.9–10.3)
Chloride: 107 mmol/L (ref 98–111)
Creatinine, Ser: 0.87 mg/dL (ref 0.44–1.00)
GFR calc Af Amer: >60 mL/min (ref >60)
GFR calc non Af Amer: >60 mL/min (ref >60)
Glucose, Bld: 110 mg/dL — ABNORMAL HIGH (ref 70–99)
Potassium: 3.5 mmol/L (ref 3.5–5.1)
Sodium: 139 mmol/L (ref 135–145)

## 2018-09-19 LAB — CBC
HCT: 39.8 % (ref 36.0–46.0)
Hemoglobin: 12 g/dL (ref 12.0–15.0)
MCH: 22.8 pg — ABNORMAL LOW (ref 26.0–34.0)
MCHC: 30.2 g/dL (ref 30.0–36.0)
MCV: 75.5 fL — ABNORMAL LOW (ref 80.0–100.0)
Platelets: 358 10*3/uL (ref 150–400)
RBC: 5.27 MIL/uL — ABNORMAL HIGH (ref 3.87–5.11)
RDW: 16.8 % — ABNORMAL HIGH (ref 11.5–15.5)
WBC: 14.4 10*3/uL — ABNORMAL HIGH (ref 4.0–10.5)
nRBC: 0 % (ref 0.0–0.2)

## 2018-09-19 LAB — HEMOGLOBIN A1C
Hgb A1c MFr Bld: 5.8 % — ABNORMAL HIGH (ref 4.8–5.6)
Mean Plasma Glucose: 119.76 mg/dL

## 2018-09-19 NOTE — Progress Notes (Signed)
PCP - Florina Ou MD  Chest x-ray - N/A EKG - 09/19/18 ECHO - 2010   Fasting Blood Sugar - Doesn't check  Blood Thinner Instructions: N/A Aspirin Instructions: N/A  Anesthesia review: none  Patient denies shortness of breath, fever, cough and chest pain at PAT appointment   Patient verbalized understanding of instructions that were given to them at the PAT appointment. Patient was also instructed that they will need to review over the PAT instructions again at home before surgery.

## 2018-09-20 ENCOUNTER — Telehealth: Payer: Self-pay

## 2018-09-20 DIAGNOSIS — Z7984 Long term (current) use of oral hypoglycemic drugs: Secondary | ICD-10-CM | POA: Diagnosis not present

## 2018-09-20 DIAGNOSIS — L910 Hypertrophic scar: Secondary | ICD-10-CM | POA: Diagnosis not present

## 2018-09-20 DIAGNOSIS — Z85818 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, oral cavity, and pharynx: Secondary | ICD-10-CM | POA: Diagnosis not present

## 2018-09-20 DIAGNOSIS — Y838 Other surgical procedures as the cause of abnormal reaction of the patient, or of later complication, without mention of misadventure at the time of the procedure: Secondary | ICD-10-CM | POA: Diagnosis not present

## 2018-09-20 DIAGNOSIS — Z8589 Personal history of malignant neoplasm of other organs and systems: Secondary | ICD-10-CM | POA: Diagnosis not present

## 2018-09-20 DIAGNOSIS — S0501XA Injury of conjunctiva and corneal abrasion without foreign body, right eye, initial encounter: Secondary | ICD-10-CM | POA: Diagnosis not present

## 2018-09-20 HISTORY — PX: KELOID EXCISION: SHX1856

## 2018-09-20 MED FILL — ERYTHROMYCIN EYE OINTMENT: 5 | 21 days supply | Qty: 4 | Fill #0

## 2018-09-20 MED FILL — CIPROFLOXACIN 0.3% EYE DRO: 0.3 | 16 days supply | Qty: 10 | Fill #0

## 2018-09-20 NOTE — Telephone Encounter (Signed)
Nurse called to make sure you were aware patient's WBC was elevated on pre operative labs. No SOB, cough or fever.  Having procedure at [facility] today for keloid resection/steroid inj.

## 2018-09-20 NOTE — Progress Notes (Signed)
Anesthesia Chart Review:  Case:  [id number] Date/Time:  09/24/18 0905   Procedure:  DILATATION & CURETTAGE/HYSTEROSCOPY WITH MYOSURE (N/A ) - request to follow in [facility] block time requests one hour   Anesthesia type:  General   Pre-op diagnosis:  endometrial polyps   Location:  [facility] OR ROOM 7 / [facility] ORS   Surgeon:  [name], MD      DISCUSSION: Patient is a 38 year old female scheduled for the above procedure.   History includes never smoker, anemia, asthma, pre-diabetes. 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 BMI is consistent with morbid obesity.  WBC mildly elevated (14.4) per preoperative labs. She denied SOB, cough, fever, and chest pain at PAT. I have routed labs and notified of keloid resection with steroid/5-FU injection scheduled today to Dr. Seymour Bars and left a voice message with her scheduler Olegario Messier. Would defer additional recommendations, if any, to surgeon. From an anesthesia standpoint, I would anticipate that she can proceed as planned if no acute changes.   VS: BP (!) 161/74   Pulse 87   Temp 36.8 C   Resp 20   Ht 5\' 8"  (1.727 m)   Wt 129.3 kg   LMP 09/14/2018   SpO2 100%   BMI 43.35 kg/m   PROVIDERS: Donato Schultz, DO is PCP    LABS: Preoperative labs noted. See Discussion.  (all labs ordered are listed, but only abnormal results are displayed)  Labs Reviewed  CBC - Abnormal; Notable for the following components:      Result Value   WBC 14.4 (*)    RBC 5.27 (*)    MCV 75.5 (*)    MCH 22.8 (*)    RDW 16.8 (*)    All other components within normal limits  BASIC METABOLIC PANEL - Abnormal; Notable for the following components:   Glucose, Bld 110 (*)    BUN 5 (*)    All other components within normal limits  HEMOGLOBIN A1C -  Abnormal; Notable for the following components:   Hgb A1c MFr Bld 5.8 (*)    All other components within normal limits    EKG: 09/19/18: SR with occasional PVC. LVH.   CV: Echo 07/22/09: Study Conclusions - Left ventricle: The cavity size was normal. Wall thickness was  normal. Systolic function was normal. The estimated ejection  fraction was in the range of 55% to 60%. - Mitral valve: Mild regurgitation.   Past Medical History:  Diagnosis Date  . Anemia   . Asthma   . Genital herpes   . Obesity   . Pre-diabetes   . Thyroid disease     Past Surgical History:  Procedure Laterality Date  . KELOID EXCISION  2008   removed @[facility] ----- 10/2011  . SALIVARY GLAND SURGERY     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    MEDICATIONS: . albuterol (PROAIR HFA) 108 (90 Base) MCG/ACT inhaler  . ALPRAZolam (XANAX) 0.25 MG tablet  . buPROPion (WELLBUTRIN XL) 150 MG 24 hr tablet  . cyclobenzaprine (FLEXERIL) 10 MG tablet  . doxycycline (VIBRAMYCIN) 100 MG capsule  . metFORMIN (GLUCOPHAGE-XR) 500 MG 24 hr tablet  . Prenatal Vit-Fe Fumarate-FA (PRENATAL MULTIVITAMIN) TABS tablet  . traMADol (ULTRAM) 50 MG tablet  . valACYclovir (VALTREX) 1000 MG tablet   No current facility-administered medications for this  encounter.     Shonna Chock, PA-C Surgical Short Stay/Anesthesiology [facility] Phone [phone] [facility] Phone [phone] 09/20/2018 10:20 AM

## 2018-09-20 NOTE — Telephone Encounter (Signed)
Yes, thanks

## 2018-09-24 ENCOUNTER — Ambulatory Visit (HOSPITAL_COMMUNITY): Payer: BLUE CROSS/BLUE SHIELD | Admitting: Anesthesiology

## 2018-09-24 ENCOUNTER — Ambulatory Visit (HOSPITAL_COMMUNITY): Payer: BLUE CROSS/BLUE SHIELD | Admitting: Vascular Surgery

## 2018-09-24 ENCOUNTER — Encounter (HOSPITAL_COMMUNITY)
Admission: RE | Disposition: A | Discharge status: Home / Self Care | Payer: Self-pay | Source: Home / Self Care | Attending: Obstetrics & Gynecology

## 2018-09-24 ENCOUNTER — Ambulatory Visit (HOSPITAL_COMMUNITY)
Admission: RE | Admit: 2018-09-24 | Discharge: 2018-09-24 | Disposition: A | Discharge status: Home / Self Care | Payer: BLUE CROSS/BLUE SHIELD | Attending: Obstetrics & Gynecology | Admitting: Obstetrics & Gynecology

## 2018-09-24 ENCOUNTER — Encounter (HOSPITAL_COMMUNITY): Payer: Self-pay | Admitting: Anesthesiology

## 2018-09-24 ENCOUNTER — Other Ambulatory Visit: Payer: Self-pay

## 2018-09-24 DIAGNOSIS — N978 Female infertility of other origin: Secondary | ICD-10-CM | POA: Insufficient documentation

## 2018-09-24 DIAGNOSIS — Z79899 Other long term (current) drug therapy: Secondary | ICD-10-CM | POA: Diagnosis not present

## 2018-09-24 DIAGNOSIS — Z7984 Long term (current) use of oral hypoglycemic drugs: Secondary | ICD-10-CM | POA: Insufficient documentation

## 2018-09-24 DIAGNOSIS — Z6841 Body Mass Index (BMI) 40.0 and over, adult: Secondary | ICD-10-CM | POA: Insufficient documentation

## 2018-09-24 DIAGNOSIS — N840 Polyp of corpus uteri: Secondary | ICD-10-CM | POA: Diagnosis not present

## 2018-09-24 DIAGNOSIS — J45909 Unspecified asthma, uncomplicated: Secondary | ICD-10-CM | POA: Diagnosis not present

## 2018-09-24 HISTORY — DX: Prediabetes: R73.03

## 2018-09-24 HISTORY — PX: DILATATION & CURETTAGE/HYSTEROSCOPY WITH MYOSURE: SHX6511

## 2018-09-24 LAB — GLUCOSE, CAPILLARY: Glucose-Capillary: 117 mg/dL — ABNORMAL HIGH (ref 70–99)

## 2018-09-24 LAB — PREGNANCY, URINE: Preg Test, Ur: NEGATIVE

## 2018-09-24 SURGERY — DILATATION & CURETTAGE/HYSTEROSCOPY WITH MYOSURE
Anesthesia: General

## 2018-09-24 MED ORDER — PROMETHAZINE HCL 25 MG/ML IJ SOLN: 6.2500 mg | INTRAMUSCULAR | Status: DC | PRN

## 2018-09-24 MED ORDER — ONDANSETRON HCL 4 MG/2ML IJ SOLN
INTRAMUSCULAR | Status: DC | PRN
  Administered 2018-09-24: 4 mg via INTRAVENOUS

## 2018-09-24 MED ORDER — KETOROLAC TROMETHAMINE 30 MG/ML IJ SOLN
INTRAMUSCULAR | Status: DC | PRN
  Administered 2018-09-24: 30 mg via INTRAVENOUS

## 2018-09-24 MED ORDER — FENTANYL CITRATE (PF) 100 MCG/2ML IJ SOLN
25.0000 ug | INTRAMUSCULAR | Status: DC | PRN
  Administered 2018-09-24: 50 ug via INTRAVENOUS

## 2018-09-24 MED ORDER — MIDAZOLAM HCL 2 MG/2ML IJ SOLN
INTRAMUSCULAR | Status: DC | PRN
  Administered 2018-09-24: 1 mg via INTRAVENOUS

## 2018-09-24 MED ORDER — OXYCODONE-ACETAMINOPHEN 7.5-325 MG PO TABS: 1.0000 | ORAL_TABLET | Freq: Four times a day (QID) | ORAL | 0 refills | Status: DC | PRN

## 2018-09-24 MED ORDER — SCOPOLAMINE 1 MG/3DAYS TD PT72
MEDICATED_PATCH | TRANSDERMAL | Status: AC
  Filled 2018-09-24: qty 1

## 2018-09-24 MED ORDER — CHLOROPROCAINE HCL 1 % IJ SOLN
INTRAMUSCULAR | Status: AC
  Filled 2018-09-24: qty 30

## 2018-09-24 MED ORDER — SCOPOLAMINE 1 MG/3DAYS TD PT72
1.0000 | MEDICATED_PATCH | Freq: Once | TRANSDERMAL | Status: DC
  Administered 2018-09-24: 1.5 mg via TRANSDERMAL

## 2018-09-24 MED ORDER — FENTANYL CITRATE (PF) 100 MCG/2ML IJ SOLN
INTRAMUSCULAR | Status: AC
  Filled 2018-09-24: qty 2

## 2018-09-24 MED ORDER — FENTANYL CITRATE (PF) 250 MCG/5ML IJ SOLN
INTRAMUSCULAR | Status: AC
  Filled 2018-09-24: qty 5

## 2018-09-24 MED ORDER — ACETAMINOPHEN 500 MG PO TABS
1000.0000 mg | ORAL_TABLET | Freq: Once | ORAL | Status: AC
  Administered 2018-09-24: 1000 mg via ORAL

## 2018-09-24 MED ORDER — MIDAZOLAM HCL 2 MG/2ML IJ SOLN
INTRAMUSCULAR | Status: AC
  Filled 2018-09-24: qty 2

## 2018-09-24 MED ORDER — ACETAMINOPHEN 500 MG PO TABS
ORAL_TABLET | ORAL | Status: AC
  Filled 2018-09-24: qty 2

## 2018-09-24 MED ORDER — DEXTROSE 5 % IV SOLN
3.0000 g | INTRAVENOUS | Status: AC
  Administered 2018-09-24: 3 g via INTRAVENOUS
  Filled 2018-09-24: qty 3000

## 2018-09-24 MED ORDER — PROPOFOL 10 MG/ML IV BOLUS
INTRAVENOUS | Status: DC | PRN
  Administered 2018-09-24: 50 mg via INTRAVENOUS
  Administered 2018-09-24: 200 mg via INTRAVENOUS
  Administered 2018-09-24: 20 mg via INTRAVENOUS

## 2018-09-24 MED ORDER — GLYCOPYRROLATE 0.2 MG/ML IJ SOLN
INTRAMUSCULAR | Status: DC | PRN
  Administered 2018-09-24 (×2): 0.1 mg via INTRAVENOUS

## 2018-09-24 MED ORDER — FENTANYL CITRATE (PF) 100 MCG/2ML IJ SOLN
INTRAMUSCULAR | Status: DC | PRN
  Administered 2018-09-24 (×2): 50 ug via INTRAVENOUS

## 2018-09-24 MED ORDER — LACTATED RINGERS IV SOLN
INTRAVENOUS | Status: DC
  Administered 2018-09-24: 125 mL/h via INTRAVENOUS
  Administered 2018-09-24: 10:00:00 via INTRAVENOUS

## 2018-09-24 MED ORDER — SODIUM CHLORIDE 0.9 % IR SOLN
Status: DC | PRN
  Administered 2018-09-24: 3000 mL

## 2018-09-24 MED ORDER — CHLOROPROCAINE HCL 1 % IJ SOLN
INTRAMUSCULAR | Status: DC | PRN
  Administered 2018-09-24: 20 mL

## 2018-09-24 MED ORDER — DEXTROSE 5 % IV SOLN
INTRAVENOUS | Status: AC
  Filled 2018-09-24: qty 3000

## 2018-09-24 MED ORDER — LIDOCAINE HCL (CARDIAC) PF 100 MG/5ML IV SOSY
PREFILLED_SYRINGE | INTRAVENOUS | Status: DC | PRN
  Administered 2018-09-24: 100 mg via INTRAVENOUS

## 2018-09-24 MED FILL — OXYCODONE/APAP 7.5/325MG: 7.5-325 | 1 days supply | Qty: 4 | Fill #0

## 2018-09-24 SURGICAL SUPPLY — 14 items
CATH ROBINSON RED A/P 16FR (CATHETERS) ×3 IMPLANT
DEVICE MYOSURE LITE (MISCELLANEOUS) ×2 IMPLANT
DEVICE MYOSURE REACH (MISCELLANEOUS) IMPLANT
GLOVE BIO SURGEON STRL SZ 6.5 (GLOVE) ×2 IMPLANT
GLOVE BIO SURGEONS STRL SZ 6.5 (GLOVE) ×1
GLOVE BIOGEL PI IND STRL 7.0 (GLOVE) ×2 IMPLANT
GLOVE BIOGEL PI INDICATOR 7.0 (GLOVE) ×4
GOWN STRL REUS W/TWL LRG LVL3 (GOWN DISPOSABLE) ×6 IMPLANT
HIBICLENS CHG 4% 4OZ BTL (MISCELLANEOUS) ×3 IMPLANT
KIT PROCEDURE FLUENT (KITS) ×3 IMPLANT
PACK VAGINAL MINOR WOMEN LF (CUSTOM PROCEDURE TRAY) ×3 IMPLANT
PAD OB MATERNITY 4.3X12.25 (PERSONAL CARE ITEMS) ×3 IMPLANT
SEAL ROD LENS SCOPE MYOSURE (ABLATOR) ×3 IMPLANT
TOWEL OR 17X24 6PK STRL BLUE (TOWEL DISPOSABLE) ×6 IMPLANT

## 2018-09-24 NOTE — Discharge Instructions (Addendum)
DISCHARGE INSTRUCTIONS: D&C / D&E The following instructions have been prepared to help you care for yourself upon your return home.   Personal hygiene:  Use sanitary pads for vaginal drainage, not tampons.  Shower the day after your procedure.  NO tub baths, pools or Jacuzzis for 2-3 weeks.  Wipe front to back after using the bathroom.  Activity and limitations:  Do NOT drive or operate any equipment for 24 hours. The effects of anesthesia are still present and drowsiness may result.  Do NOT rest in bed all day.  Walking is encouraged.  Walk up and down stairs slowly.  You may resume your normal activity in one to two days or as indicated by your physician.  Sexual activity: NO intercourse for at least 2 weeks after the procedure, or as indicated by your physician.  Diet: Eat a light meal as desired this evening. You may resume your usual diet tomorrow.  Return to work: You may resume your work activities in one to two days or as indicated by your doctor.  What to expect after your surgery: Expect to have vaginal bleeding/discharge for 2-3 days and spotting for up to 10 days. It is not unusual to have soreness for up to 1-2 weeks. You may have a slight burning sensation when you urinate for the first day. Mild cramps may continue for a couple of days. You may have a regular period in 2-6 weeks.  Call your doctor for any of the following:  Excessive vaginal bleeding, saturating and changing one pad every hour.  Inability to urinate 6 hours after discharge from hospital.  Pain not relieved by pain medication.  Fever of 100.4 F or greater.  Unusual vaginal discharge or odor.   Call for an appointment:    Patients signature: ______________________  Nurses signature ________________________  Support person's signature_______________________   Hysteroscopy, Care After This sheet gives you information about how to care for yourself after your procedure. Your  health care provider may also give you more specific instructions. If you have problems or questions, contact your health care provider. What can I expect after the procedure? After the procedure, it is common to have:  Cramping.  Bleeding. This can vary from light spotting to menstrual-like bleeding. Follow these instructions at home: Activity  Rest for 1-2 days after the procedure.  Do not douche, use tampons, or have sex for 2 weeks after the procedure, or until your health care provider approves.  Do not drive for 24 hours after the procedure, or for as long as told by your health care provider.  Do not drive, use heavy machinery, or drink alcohol while taking prescription pain medicines. Medicines   Take over-the-counter and prescription medicines only as told by your health care provider.  Do not take aspirin during recovery. It can increase the risk of bleeding. General instructions  Do not take baths, swim, or use a hot tub until your health care provider approves. Take showers instead of baths for 2 weeks, or for as long as told by your health care provider.  To prevent or treat constipation while you are taking prescription pain medicine, your health care provider may recommend that you: ? Drink enough fluid to keep your urine clear or pale yellow. ? Take over-the-counter or prescription medicines. ? Eat foods that are high in fiber, such as fresh fruits and vegetables, whole grains, and beans. ? Limit foods that are high in fat and processed sugars, such as fried and sweet foods.  Keep all follow-up visits as told by your health care provider. This is important. Contact a health care provider if:  You feel dizzy or lightheaded.  You feel nauseous.  You have abnormal vaginal discharge.  You have a rash.  You have pain that does not get better with medicine.  You have chills. Get help right away if:  You have bleeding that is heavier than a normal menstrual  period.  You have a fever.  You have pain or cramps that get worse.  You develop new abdominal pain.  You faint.  You have pain in your shoulders.  You have shortness of breath. Summary  After the procedure, you may have cramping and some vaginal bleeding.  Do not douche, use tampons, or have sex for 2 weeks after the procedure, or until your health care provider approves.  Do not take baths, swim, or use a hot tub until your health care provider approves. Take showers instead of baths for 2 weeks, or for as long as told by your health care provider.  Report any unusual symptoms to your health care provider.  Keep all follow-up visits as told by your health care provider. This is important. This information is not intended to replace advice given to you by your health care provider. Make sure you discuss any questions you have with your health care provider. Document Released: 06/25/2013 Document Revised: 10/03/2016 Document Reviewed: 10/03/2016 Elsevier Interactive Patient Education  2019 [employer].

## 2018-09-24 NOTE — Transfer of Care (Signed)
Immediate Anesthesia Transfer of Care Note  Patient: [name]  Procedure(s) Performed: DILATATION & CURETTAGE/HYSTEROSCOPY WITH MYOSURE POLYPECTOMY (N/A )  Patient Location: PACU  Anesthesia Type:General  Level of Consciousness: awake, oriented and patient cooperative  Airway & Oxygen Therapy: Patient Spontanous Breathing and Patient connected to nasal cannula oxygen  Post-op Assessment: Report given to RN and Post -op Vital signs reviewed and stable  Post vital signs: Reviewed and stable  Last Vitals:  Vitals Value Taken Time  BP 118/72 09/24/2018 10:39 AM  Temp    Pulse 87 09/24/2018 10:40 AM  Resp 17 09/24/2018 10:40 AM  SpO2 96 % 09/24/2018 10:40 AM  Vitals shown include unvalidated device data.  Last Pain:  Vitals:   09/24/18 0823  TempSrc: Oral  PainSc: 0-No pain      Patients Stated Pain Goal: 5 (09/24/18 1194)  Complications: No apparent anesthesia complications

## 2018-09-24 NOTE — Anesthesia Preprocedure Evaluation (Signed)
Anesthesia Evaluation  Patient identified by MRN, date of birth, ID band Patient awake    Reviewed: Allergy & Precautions, NPO status , Patient's Chart, lab work & pertinent test results  Airway Mallampati: II  TM Distance: >3 FB Neck ROM: Full    Dental  (+) Teeth Intact, Dental Advisory Given   Pulmonary asthma ,    Pulmonary exam normal breath sounds clear to auscultation       Cardiovascular Exercise Tolerance: Good negative cardio ROS Normal cardiovascular exam Rhythm:Regular Rate:Normal     Neuro/Psych negative neurological ROS  negative psych ROS   GI/Hepatic negative GI ROS, Neg liver ROS,   Endo/Other  Morbid obesityPre-diabetes  Renal/GU negative Renal ROS     Musculoskeletal negative musculoskeletal ROS (+)   Abdominal   Peds  Hematology negative hematology ROS (+)   Anesthesia Other Findings Day of surgery medications reviewed with the patient.  Reproductive/Obstetrics                             Anesthesia Physical Anesthesia Plan  ASA: II  Anesthesia Plan: General   Post-op Pain Management:    Induction: Intravenous  PONV Risk Score and Plan: 4 or greater and Diphenhydramine, Scopolamine patch - Pre-op, Midazolam, Ondansetron and Dexamethasone  Airway Management Planned: LMA  Additional Equipment:   Intra-op Plan:   Post-operative Plan: Extubation in OR  Informed Consent: I have reviewed the patients History and Physical, chart, labs and discussed the procedure including the risks, benefits and alternatives for the proposed anesthesia with the patient or authorized representative who has indicated his/her understanding and acceptance.   Dental advisory given  Plan Discussed with: CRNA  Anesthesia Plan Comments:         Anesthesia Quick Evaluation

## 2018-09-24 NOTE — H&P (Signed)
[name] is an 38 y.o. female. G0 Married.  Professor of Biology  RP: 2 IU lesions per HSG for Hysteroscopy/Myosure excision/D+C  HPI: Primary infertility.  HSG 07/01/2018 Patent tubes bilaterally.  2 small defects seen in the IU cavity.  Pertinent Gynecological History: Menses: flow is moderate Contraception: None Blood transfusions: none Sexually transmitted diseases: HSV Last pap: normal  OB History: G0   Menstrual History: [name]'s last menstrual period was 09/14/2018.    Past Medical History:  Diagnosis Date  . Anemia   . Asthma   . Genital herpes   . Obesity   . Pre-diabetes   . Thyroid disease     Past Surgical History:  Procedure Laterality Date  . KELOID EXCISION  2008   removed @[facility] ----- 10/2011  . KELOID EXCISION  09/20/2018   behind right ear  . 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 Hyperlipidemia Mother   . Hypertension Mother   . Depression Father   . Hyperlipidemia Father   . Heart disease Father 21       MI  . Hypertension Father   . Cancer Father 53       prostate  . Cancer Paternal Grandfather        prostate  . Coronary artery disease Other   . Prostate cancer Other   . Cancer Maternal Grandfather        prostate    Social History:  reports that she has never smoked. She has never used smokeless tobacco. She reports current alcohol use. She reports that she does not use drugs.  Allergies: No Known Allergies  Medications Prior to Admission  Medication Sig Dispense Refill Last Dose  . metFORMIN (GLUCOPHAGE-XR) 500 MG 24 hr tablet Take 1 tablet (500 mg total) by mouth daily with breakfast. 90 tablet 3 09/23/2018 at Unknown time  . Prenatal Vit-Fe Fumarate-FA (PRENATAL MULTIVITAMIN) TABS tablet Take 1 tablet by mouth daily at 12 noon.   09/23/2018 at Unknown time  . valACYclovir (VALTREX) 1000 MG tablet Take 1 tablet (1,000 mg total) by mouth  daily. 90 tablet 1 09/23/2018 at Unknown time  . albuterol (PROAIR HFA) 108 (90 Base) MCG/ACT inhaler Inhale 2 puffs into the lungs every 6 (six) hours as needed. 6.7 g 1 More than a month at Unknown time  . ALPRAZolam (XANAX) 0.25 MG tablet Take 1 tablet (0.25 mg total) by mouth 3 (three) times daily as needed for sleep. 30 tablet 0 More than a month at Unknown time  . buPROPion (WELLBUTRIN XL) 150 MG 24 hr tablet Take 1 tablet (150 mg total) by mouth daily. After 1 week increase to 2 a day ([name] not taking: Reported on 09/12/2018) 60 tablet 0 Not Taking at Unknown time  . cyclobenzaprine (FLEXERIL) 10 MG tablet Take 1 tablet (10 mg total) by mouth 3 (three) times daily as needed for muscle spasms. 30 tablet 0 More than a month at Unknown time  . doxycycline (VIBRAMYCIN) 100 MG capsule Take one tablet by mouth twice daily starting day before procedure. ([name] not taking: Reported on 09/12/2018) 6 capsule 0 Not Taking at Unknown time  . traMADol (ULTRAM) 50 MG tablet Take 1 tablet (50 mg total) by mouth every 8 (eight) hours as needed. ([name] not taking: Reported on 09/12/2018) 30 tablet 0 Not Taking at Unknown time    REVIEW OF SYSTEMS: A ROS was performed and pertinent  positives and negatives are included in the history.  GENERAL: No fevers or chills. HEENT: No change in vision, no earache, sore throat or sinus congestion. NECK: No pain or stiffness. CARDIOVASCULAR: No chest pain or pressure. No palpitations. PULMONARY: No shortness of breath, cough or wheeze. GASTROINTESTINAL: No abdominal pain, nausea, vomiting or diarrhea, melena or bright red blood per rectum. GENITOURINARY: No urinary frequency, urgency, hesitancy or dysuria. MUSCULOSKELETAL: No joint or muscle pain, no back pain, no recent trauma. DERMATOLOGIC: No rash, no itching, no lesions. ENDOCRINE: No polyuria, polydipsia, no heat or cold intolerance. No recent change in weight. HEMATOLOGICAL: No anemia or easy bruising or bleeding.  NEUROLOGIC: No headache, seizures, numbness, tingling or weakness. PSYCHIATRIC: No depression, no loss of interest in normal activity or change in sleep pattern.     Blood pressure (!) 146/94, pulse 85, temperature 98.3 F (36.8 C), temperature source Oral, last menstrual period 09/14/2018, SpO2 99 %.  Physical Exam:  See office notes   Results for orders placed or performed during the hospital encounter of 09/24/18 (from the past 24 hour(s))  Pregnancy, urine     Status: None   Collection Time: 09/24/18  7:45 AM  Result Value Ref Range   Preg Test, Ur NEGATIVE NEGATIVE   [facility] Infusion Hysterogram ( procedure note) on 07/30/2018   The initial transvaginal ultrasound demonstrated the following: T/V and T/A images.  Retroverted uterus measuring 7.77 x 6.4 x 4.55 cm.  Prominent endometrium measuring 14.1 mm with positive color flow Doppler but no defect seen.  Right ovary normal.  Left ovary with a thin-walled cyst measuring 3.2 x 3.1 x 3.3 cm containing internal low-level echoes with a reticular echo pattern and negative color flow Doppler.  Bilateral increase in the ovarian volume.  Small amount of free fluid in the posterior cul-de-sac measuring 4.9 x 2.9 cm.  The speculum  was inserted and the cervix cleansed with Betadine solution after confirming that [name] has no allergies.A small sonohysterography catheterwas utilized.  Insertion was facilitated with ring forceps, using a spear-like motion the catheter was inserted to the fundus of the uterus. The speculum is then removed carefully to avoid dislodging the catheter. The catheter was flushed with sterile saline delete prior to insertion to rid it of small amounts of air.the sterile saline solution was infused into the uterine cavity as a vaginal ultrasound probe was then placed in the vagina for full visualization of the uterine cavity from a transvaginal approach. The following was noted: Following injection of saline the  endometrium is filled with 2 defects seen measuring 1.1 x 0.9 cm and 1 cm x 1 cm.  The catheter was then removed after retrieving some of the saline from the intrauterine cavity. An endometrial biopsy was not done. [name] tolerated procedure well. She had received a tablet of Aleve for discomfort.    Assessment/Plan:  38 y.o. G0  1. 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  Will proceed with excision.  2. Endometrial polyp Sonohysterogram confirming 2 Intra-uterine lesions c/w Endometrial Polyps.  Scheduled Hysteroscopy with Myosure Excision and D+C.  Information on procedure discussed.  Pamphlet given.                          [name] was counseled as to the risk of surgery to include the following:  1. Infection (prohylactic antibiotics will be administered)  2. DVT/Pulmonary Embolism (prophylactic pneumo compression stockings will be used)  3.Trauma to internal organs  requiring additional surgical procedure to repair any injury to internal organs requiring perhaps additional hospitalization days.  4.Hemmorhage requiring transfusion and blood products which carry risks such as anaphylactic reaction, hepatitis and AIDS  [name] had received literature information on the procedure scheduled and all her questions were answered and fully accepts all risk.   Marie-Lyne Etoile Looman 09/24/2018, 8:54 AM

## 2018-09-24 NOTE — Op Note (Signed)
Operative Note  09/24/2018  11:16 AM  PATIENT:  [name]  38 y.o. female  PRE-OPERATIVE DIAGNOSIS:  Endometrial polyps  POST-OPERATIVE DIAGNOSIS:  Endometrial polyps  PROCEDURE:  Procedure(s): DILATATION & CURETTAGE/HYSTEROSCOPY WITH MYOSURE POLYPECTOMIES  SURGEON:  Surgeon(s): [name], MD  ANESTHESIA:   general  FINDINGS: 2 Endometrial Polyps present.  Normal intra-uterine cavity otherwise, with bilateral ostia visualized.  DESCRIPTION OF OPERATION: Under general anesthesia with laryngeal mask, the patient is in lithotomy position.  She is prepped with Betadine on the suprapubic, vulvar and vaginal areas.  The bladder is catheterized.  The patient is draped as usual.  Timeout is done.  The vaginal exam reveals a normal-sized uterus, retroverted, mobile.  No adnexal mass.  The speculum is inserted in the vagina and the anterior lip of the cervix is grasped with a tenaculum.  A paracervical block is done with Nesacaine 1% a total of 20 cc at 4 and 8:00.  The hysterometry is at 8 cm.  Dilation of the cervix with Pratt dilators up to #21.  Insertion of the hysteroscope in the intra-uterine cavity.  Good visualization of both ostia.  2 polyps are present each measuring about 1 to 1.5 cm in diameter, at the right anterior wall and at the mid posterior wall.  No other lesion is present.  Pictures are taken.  The light MyoSure is inserted through the hysteroscope.  Complete excision of both polyps.  Good hemostasis.  Pictures taken after excision.  The hysteroscope and MyoSure are removed.  Systematic curettage of the intrauterine cavity on all surfaces with a sharp curette.  The endometrial curettings are sent together with the excision specimen to pathology.  The tenaculum was removed from the cervix.  Good hemostasis.  The speculum is removed.  The patient is brought to recovery room in good and stable status.  ESTIMATED BLOOD LOSS: 5 mL FLUID DEFICIT: 135 cc   Intake/Output  Summary (Last 24 hours) at 09/24/2018 1116 Last data filed at 09/24/2018 1042 Gross per 24 hour  Intake 1300 ml  Output 30 ml  Net 1270 ml     BLOOD ADMINISTERED:none   LOCAL MEDICATIONS USED:  Nesacaine 1%, Amount: 20 ml for Paracervical block  SPECIMEN:  Source of Specimen:  Excision specimen of Polyps and Endometrial curettings  DISPOSITION OF SPECIMEN:  PATHOLOGY  COUNTS:  YES  PLAN OF CARE: Transfer to PACU  Marie-Lyne LavoieMD11:16 AM

## 2018-09-24 NOTE — Anesthesia Procedure Notes (Signed)
Procedure Name: LMA Insertion Performed by: Earmon Phoenix, CRNA Pre-anesthesia Checklist: Patient identified, Emergency Drugs available, Suction available, Patient being monitored and Timeout performed Patient Re-evaluated:Patient Re-evaluated prior to induction Oxygen Delivery Method: Circle system utilized Preoxygenation: Pre-oxygenation with 100% oxygen Induction Type: IV induction Ventilation: Mask ventilation without difficulty LMA: LMA inserted LMA Size: 4.0 Number of attempts: 1 Placement Confirmation: positive ETCO2,  CO2 detector and breath sounds checked- equal and bilateral Tube secured with: Tape Dental Injury: Teeth and Oropharynx as per pre-operative assessment

## 2018-09-25 LAB — GLUCOSE, CAPILLARY: Glucose-Capillary: 99 mg/dL (ref 70–99)

## 2018-09-25 NOTE — Anesthesia Postprocedure Evaluation (Signed)
Anesthesia Post Note  Patient: [name]  Procedure(s) Performed: DILATATION & CURETTAGE/HYSTEROSCOPY WITH MYOSURE POLYPECTOMY (N/A )     Patient location during evaluation: PACU Anesthesia Type: General Level of consciousness: awake and alert Pain management: pain level controlled Vital Signs Assessment: post-procedure vital signs reviewed and stable Respiratory status: spontaneous breathing, nonlabored ventilation, respiratory function stable and patient connected to nasal cannula oxygen Cardiovascular status: blood pressure returned to baseline and stable Postop Assessment: no apparent nausea or vomiting Anesthetic complications: no    Last Vitals:  Vitals:   09/24/18 1154 09/24/18 1241  BP:  (!) 156/82  Pulse: 86 72  Resp: 17 20  Temp: 36.8 C   SpO2: 93% 99%    Last Pain:  Vitals:   09/24/18 1154  TempSrc: Oral  PainSc:                  Cecile Hearing

## 2018-09-26 ENCOUNTER — Encounter (HOSPITAL_COMMUNITY): Payer: Self-pay | Admitting: Obstetrics & Gynecology

## 2018-09-26 DIAGNOSIS — L910 Hypertrophic scar: Secondary | ICD-10-CM | POA: Diagnosis not present

## 2018-10-14 ENCOUNTER — Other Ambulatory Visit: Payer: Self-pay | Admitting: Family Medicine

## 2018-10-14 DIAGNOSIS — A6000 Herpesviral infection of urogenital system, unspecified: Secondary | ICD-10-CM

## 2018-10-14 MED FILL — valACYclovir HCL 1 GM TABS: 1 | 90 days supply | Qty: 90 | Fill #0

## 2018-10-14 MED FILL — metFORMIN HCL ER 500 MG TB2: 500 | 90 days supply | Qty: 90 | Fill #1

## 2018-10-16 ENCOUNTER — Encounter: Payer: Self-pay | Admitting: Family Medicine

## 2018-10-22 ENCOUNTER — Encounter: Payer: Self-pay | Admitting: Obstetrics & Gynecology

## 2018-10-22 ENCOUNTER — Encounter: Payer: Self-pay | Admitting: *Deleted

## 2018-10-22 ENCOUNTER — Ambulatory Visit: Payer: BLUE CROSS/BLUE SHIELD | Admitting: Obstetrics & Gynecology

## 2018-10-22 ENCOUNTER — Ambulatory Visit (INDEPENDENT_AMBULATORY_CARE_PROVIDER_SITE_OTHER): Payer: BLUE CROSS/BLUE SHIELD | Admitting: Obstetrics & Gynecology

## 2018-10-22 VITALS — BP 142/90 | Wt 275.4 lb

## 2018-10-22 DIAGNOSIS — Z6841 Body Mass Index (BMI) 40.0 and over, adult: Secondary | ICD-10-CM | POA: Diagnosis not present

## 2018-10-22 DIAGNOSIS — N979 Female infertility, unspecified: Secondary | ICD-10-CM

## 2018-10-22 DIAGNOSIS — Z09 Encounter for follow-up examination after completed treatment for conditions other than malignant neoplasm: Secondary | ICD-10-CM

## 2018-10-22 MED ORDER — LORCASERIN HCL 10 MG PO TABS: 10.0000 mg | ORAL_TABLET | Freq: Two times a day (BID) | ORAL | 5 refills | Status: DC

## 2018-10-22 NOTE — Patient Instructions (Signed)
1. Status post gynecological surgery, follow-up exam Good postop evolution with no complication, no sign of infection.  Pathology benign polyp and endometrium.  Patient reassured.  2. Class 3 severe obesity due to excess calories without serious comorbidity with body mass index (BMI) of 40.0 to 44.9 in adult [facility]) Body mass index at 41.87.  Would like to lose weight before attempting conception.  Recommend low calorie/carb diet such as [employer] and regular physical activity with aerobic activities 5 times a week and weightlifting every 2 days.  Patient has already started a lower calorie/carb diet but has difficulty with her appetite.  Decision to prescribe Belviq.  Usage reviewed and prescription sent to pharmacy.  3. Primary female infertility Advanced maternal age at 38 year old.  Will attempt losing weigh before conceiving but recommend no longer than 6 months delay given her age.  Will stop Belviq before attempting conception.  When ready to conceive, will probably attempt Clomid with intrauterine insemination with washed sperm.  Other orders - ibuprofen (ADVIL,MOTRIN) 200 MG tablet; Take by mouth. - traMADol (ULTRAM) 50 MG tablet; Take by mouth. - cyclobenzaprine (FLEXERIL) 10 MG tablet; 10 mg 2 (two) times daily as needed - Lorcaserin HCl 10 MG TABS; Take 10 mg by mouth 2 (two) times daily.  Karrisa, it was a pleasure seeing you today!

## 2018-10-22 NOTE — Progress Notes (Signed)
    [name] Mar 19, 1981 [id number]        38 y.o.  G0P0000   RP: Post op HSC Excision of Polyps/D+C, Obesity for wt loss and primary infertility  HPI: Good postop evolution, no pelvic pain, no vaginal discharge and no bleeding.  No fever. BMI 41.87, would like to loose wt before conceiving.  Primary female infertility with AMA at 38 yo.   OB History  Gravida Para Term Preterm AB Living  0 0 0 0 0 0  SAB TAB Ectopic Multiple Live Births  0 0 0 0 0    Past medical history,surgical history, problem list, medications, allergies, family history and social history were all reviewed and documented in the EPIC chart.   Directed ROS with pertinent positives and negatives documented in the history of present illness/assessment and plan.  Exam:  Vitals:   10/22/18 1134  Weight: 275 lb 6.4 oz (124.9 kg)   General appearance:  Normal  Gynecologic exam: Vulva normal.  Bimanual exam revealing an anteverted uterus, mobile, nontender, normal volume.  No adnexal mass, nontender bilaterally.   Assessment/Plan:  38 y.o. G0   1. Status post gynecological surgery, follow-up exam Good postop evolution with no complication, no sign of infection.  Pathology benign polyp and endometrium.  Patient reassured.  2. Class 3 severe obesity due to excess calories without serious comorbidity with body mass index (BMI) of 40.0 to 44.9 in adult [facility]) Body mass index at 41.87.  Would like to lose weight before attempting conception.  Recommend low calorie/carb diet such as [employer] and regular physical activity with aerobic activities 5 times a week and weightlifting every 2 days.  Patient has already started a lower calorie/carb diet but has difficulty with her appetite.  Decision to prescribe Belviq.  Usage reviewed and prescription sent to pharmacy.  3. Primary female infertility Advanced maternal age at 38 year old.  Will attempt losing weigh before conceiving but recommend no longer than 6  months delay given her age.  Will stop Belviq before attempting conception.  When ready to conceive, will probably attempt Clomid with intrauterine insemination with washed sperm.  Other orders - ibuprofen (ADVIL,MOTRIN) 200 MG tablet; Take by mouth. - traMADol (ULTRAM) 50 MG tablet; Take by mouth. - cyclobenzaprine (FLEXERIL) 10 MG tablet; 10 mg 2 (two) times daily as needed - Lorcaserin HCl 10 MG TABS; Take 10 mg by mouth 2 (two) times daily.  Counseling on above issues and coordination of care more than 50% for 25 minutes.  [name] MD, 11:36 AM 10/22/2018

## 2018-10-23 MED ORDER — LORCASERIN HCL 10 MG PO TABS: 10.0000 mg | ORAL_TABLET | Freq: Two times a day (BID) | ORAL | 5 refills | Status: DC

## 2018-11-28 DIAGNOSIS — L910 Hypertrophic scar: Secondary | ICD-10-CM | POA: Diagnosis not present

## 2018-12-16 MED ORDER — PHENTERMINE-TOPIRAMATE ER 3.75-23 MG PO CP24: 3.7500 mg | ORAL_CAPSULE | Freq: Every day | ORAL | 0 refills | Status: DC

## 2018-12-16 MED ORDER — PHENTERMINE-TOPIRAMATE ER 7.5-46 MG PO CP24: 7.5000 mg | ORAL_CAPSULE | Freq: Every day | ORAL | 0 refills | Status: DC

## 2018-12-16 NOTE — Telephone Encounter (Signed)
Per Dr.Lavoie message response "Agree with qysmia 3.75/23 ER daily x 2 weeks, then 7.5/46 ER daily x 3 months."

## 2018-12-16 NOTE — Telephone Encounter (Signed)
Rx called in 

## 2018-12-18 MED ORDER — PHENTERMINE-TOPIRAMATE ER 7.5-46 MG PO CP24: 7.5000 mg | ORAL_CAPSULE | Freq: Every day | ORAL | 0 refills | Status: DC

## 2018-12-18 MED ORDER — PHENTERMINE-TOPIRAMATE ER 3.75-23 MG PO CP24: 3.7500 mg | ORAL_CAPSULE | Freq: Every day | ORAL | 0 refills | Status: DC

## 2018-12-23 ENCOUNTER — Encounter: Payer: Self-pay | Admitting: Family Medicine

## 2018-12-23 DIAGNOSIS — A6000 Herpesviral infection of urogenital system, unspecified: Secondary | ICD-10-CM

## 2018-12-24 MED ORDER — VALACYCLOVIR HCL 1 G PO TABS: 1000.0000 mg | ORAL_TABLET | Freq: Every day | ORAL | 1 refills | Status: DC

## 2018-12-24 NOTE — Addendum Note (Signed)
Addended byConrad Jonesborough D on: 12/24/2018 10:55 AM   Modules accepted: Orders

## 2019-01-09 MED FILL — valACYclovir HCL 1 GM TABS: 1 | 90 days supply | Qty: 90 | Fill #1

## 2019-01-09 MED FILL — metFORMIN HCL ER 500 MG TB2: 500 | 90 days supply | Qty: 90 | Fill #2

## 2019-02-06 DIAGNOSIS — L910 Hypertrophic scar: Secondary | ICD-10-CM | POA: Diagnosis not present

## 2019-03-25 ENCOUNTER — Ambulatory Visit (INDEPENDENT_AMBULATORY_CARE_PROVIDER_SITE_OTHER): Payer: BC Managed Care – PPO | Admitting: Women's Health

## 2019-03-25 ENCOUNTER — Other Ambulatory Visit: Payer: Self-pay

## 2019-03-25 ENCOUNTER — Encounter: Payer: Self-pay | Admitting: Women's Health

## 2019-03-25 VITALS — BP 132/72 | Ht 68.0 in | Wt 247.0 lb

## 2019-03-25 DIAGNOSIS — N3000 Acute cystitis without hematuria: Secondary | ICD-10-CM

## 2019-03-25 DIAGNOSIS — R350 Frequency of micturition: Secondary | ICD-10-CM | POA: Diagnosis not present

## 2019-03-25 MED ORDER — NITROFURANTOIN MACROCRYSTAL 50 MG PO CAPS: ORAL_CAPSULE | ORAL | 1 refills | Status: DC

## 2019-03-25 MED ORDER — SULFAMETHOXAZOLE-TRIMETHOPRIM 800-160 MG PO TABS: 1.0000 | ORAL_TABLET | Freq: Two times a day (BID) | ORAL | 0 refills | Status: DC

## 2019-03-25 MED ORDER — FLUCONAZOLE 150 MG PO TABS: 150.0000 mg | ORAL_TABLET | Freq: Once | ORAL | 0 refills | Status: AC

## 2019-03-25 MED FILL — SULFAMETHOXAZOLE-TMP DS TAB: 800-160 | 3 days supply | Qty: 6 | Fill #0

## 2019-03-25 MED FILL — FLUCONAZOLE 150 MG TABS: 150 | 1 days supply | Qty: 1 | Fill #0

## 2019-03-25 MED FILL — NITROFURANTOIN MACROCRYSTAL: 50 | 30 days supply | Qty: 30 | Fill #0

## 2019-03-25 NOTE — Progress Notes (Signed)
History: 38 year old MBF G0 presents with 5 days of dysuria and urinary frequency. Has history of multiple UTI's, usually after intercourse. Same partner. Took 2 doses of Nitrofurantoin since yesterday, which improved dysuria. Started experiencing right-sided back pain and fatigue yesterday. Denies any vaginal discharge, burning, itching, or odor. Regular monthly cycles. LMP was 2 weeks ago, using no contraception. Has lost 40 lbs taking Qysmia and wants to continue losing weight with goal to conceive.  Medical problems  include diabetes and asthma.  Exam: Appears well, obese. No CVA tenderness.  UA- trace blood, leukocyte 2+, protein 1+, WBC 10-20, RBC 0-2, moderate bacteria.  Acute Cystitis  Type 2 diabetes/obesity  Plan: Bactrim DS 800-160 MG 1 tablet BID for 3 days.  Urine culture pending, requested Diflucan 150 mg 1 tablet for one dose. Nitrofurantoin 50 mg take with intercourse as needed. Recommended increasing water intake and OTC cranberry supplements. Congratulated on 40 lbs weight loss and encouraged continued exercise and healthy eating habits.

## 2019-03-25 NOTE — Patient Instructions (Signed)
Urinary Tract Infection, Adult A urinary tract infection (UTI) is an infection of any part of the urinary tract. The urinary tract includes:  The kidneys.  The ureters.  The bladder.  The urethra. These organs make, store, and get rid of pee (urine) in the body. What are the causes? This is caused by germs (bacteria) in your genital area. These germs grow and cause swelling (inflammation) of your urinary tract. What increases the risk? You are more likely to develop this condition if:  You have a small, thin tube (catheter) to drain pee.  You cannot control when you pee or poop (incontinence).  You are female, and: ? You use these methods to prevent pregnancy: ? A medicine that kills sperm (spermicide). ? A device that blocks sperm (diaphragm). ? You have low levels of a female hormone (estrogen). ? You are pregnant.  You have genes that add to your risk.  You are sexually active.  You take antibiotic medicines.  You have trouble peeing because of: ? A prostate that is bigger than normal, if you are female. ? A blockage in the part of your body that drains pee from the bladder (urethra). ? A kidney stone. ? A nerve condition that affects your bladder (neurogenic bladder). ? Not getting enough to drink. ? Not peeing often enough.  You have other conditions, such as: ? Diabetes. ? A weak disease-fighting system (immune system). ? Sickle cell disease. ? Gout. ? Injury of the spine. What are the signs or symptoms? Symptoms of this condition include:  Needing to pee right away (urgently).  Peeing often.  Peeing small amounts often.  Pain or burning when peeing.  Blood in the pee.  Pee that smells bad or not like normal.  Trouble peeing.  Pee that is cloudy.  Fluid coming from the vagina, if you are female.  Pain in the belly or lower back. Other symptoms include:  Throwing up (vomiting).  No urge to eat.  Feeling mixed up (confused).  Being tired  and grouchy (irritable).  A fever.  Watery poop (diarrhea). How is this treated? This condition may be treated with:  Antibiotic medicine.  Other medicines.  Drinking enough water. Follow these instructions at home:  Medicines  Take over-the-counter and prescription medicines only as told by your doctor.  If you were prescribed an antibiotic medicine, take it as told by your doctor. Do not stop taking it even if you start to feel better. General instructions  Make sure you: ? Pee until your bladder is empty. ? Do not hold pee for a long time. ? Empty your bladder after sex. ? Wipe from front to back after pooping if you are a female. Use each tissue one time when you wipe.  Drink enough fluid to keep your pee pale yellow.  Keep all follow-up visits as told by your doctor. This is important. Contact a doctor if:  You do not get better after 1-2 days.  Your symptoms go away and then come back. Get help right away if:  You have very bad back pain.  You have very bad pain in your lower belly.  You have a fever.  You are sick to your stomach (nauseous).  You are throwing up. Summary  A urinary tract infection (UTI) is an infection of any part of the urinary tract.  This condition is caused by germs in your genital area.  There are many risk factors for a UTI. These include having a small, thin   tube to drain pee and not being able to control when you pee or poop.  Treatment includes antibiotic medicines for germs.  Drink enough fluid to keep your pee pale yellow. This information is not intended to replace advice given to you by your health care provider. Make sure you discuss any questions you have with your health care provider. Document Released: 02/21/2008 Document Revised: 08/22/2018 Document Reviewed: 03/14/2018 Elsevier Patient Education  2020 [employer]  

## 2019-03-26 ENCOUNTER — Other Ambulatory Visit: Payer: Self-pay | Admitting: Women's Health

## 2019-03-28 ENCOUNTER — Encounter: Payer: Self-pay | Admitting: Women's Health

## 2019-03-28 LAB — URINALYSIS, COMPLETE W/RFL CULTURE
Bilirubin Urine: NEGATIVE
Glucose, UA: NEGATIVE
Hyaline Cast: NONE SEEN /LPF
Ketones, ur: NEGATIVE
Nitrites, Initial: NEGATIVE
Specific Gravity, Urine: 1.01 (ref 1.001–1.03)
pH: 6 (ref 5.0–8.0)

## 2019-03-28 LAB — URINE CULTURE
MICRO NUMBER:: 645553
SPECIMEN QUALITY:: ADEQUATE

## 2019-03-28 LAB — CULTURE INDICATED

## 2019-03-31 ENCOUNTER — Other Ambulatory Visit: Payer: Self-pay | Admitting: Obstetrics & Gynecology

## 2019-03-31 MED ORDER — PHENTERMINE-TOPIRAMATE ER 7.5-46 MG PO CP24: ORAL_CAPSULE | ORAL | 0 refills | Status: DC

## 2019-04-03 DIAGNOSIS — L910 Hypertrophic scar: Secondary | ICD-10-CM | POA: Diagnosis not present

## 2019-04-15 MED FILL — valACYclovir HCL 1 GM TABS: 1 | 30 days supply | Qty: 30 | Fill #0

## 2019-04-15 MED FILL — METFORMIN HCL ER 500 MG TB2: 500 | 90 days supply | Qty: 90 | Fill #3

## 2019-04-17 DIAGNOSIS — L910 Hypertrophic scar: Secondary | ICD-10-CM | POA: Diagnosis not present

## 2019-05-01 DIAGNOSIS — Z85818 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, oral cavity, and pharynx: Secondary | ICD-10-CM | POA: Diagnosis not present

## 2019-05-01 DIAGNOSIS — L910 Hypertrophic scar: Secondary | ICD-10-CM | POA: Diagnosis not present

## 2019-05-15 ENCOUNTER — Telehealth: Payer: Self-pay

## 2019-05-15 NOTE — Telephone Encounter (Signed)
PA initiated via Covermymeds; KEY: [id number]. PA approved.  PA Case: [id number], Status: Approved, Coverage Starts on: 05/15/2019 12:00:00 AM, Coverage Ends on: 05/14/2020 12:00:00 AM

## 2019-05-16 MED FILL — valACYclovir HCL 1 GM TABS: 1 | 90 days supply | Qty: 90 | Fill #1

## 2019-05-22 DIAGNOSIS — L910 Hypertrophic scar: Secondary | ICD-10-CM | POA: Diagnosis not present

## 2019-05-30 DIAGNOSIS — M222X2 Patellofemoral disorders, left knee: Secondary | ICD-10-CM | POA: Diagnosis not present

## 2019-05-30 DIAGNOSIS — M25562 Pain in left knee: Secondary | ICD-10-CM | POA: Diagnosis not present

## 2019-05-30 DIAGNOSIS — M222X1 Patellofemoral disorders, right knee: Secondary | ICD-10-CM | POA: Diagnosis not present

## 2019-05-30 DIAGNOSIS — M25561 Pain in right knee: Secondary | ICD-10-CM | POA: Diagnosis not present

## 2019-06-05 DIAGNOSIS — L910 Hypertrophic scar: Secondary | ICD-10-CM | POA: Diagnosis not present

## 2019-06-19 DIAGNOSIS — L910 Hypertrophic scar: Secondary | ICD-10-CM | POA: Diagnosis not present

## 2019-06-24 DIAGNOSIS — N970 Female infertility associated with anovulation: Secondary | ICD-10-CM

## 2019-06-25 MED ORDER — CLOMIPHENE CITRATE 50 MG PO TABS: ORAL_TABLET | ORAL | 0 refills | Status: DC

## 2019-06-25 MED FILL — CLOMIPHENE CITRATE 50 MG TA: 50 | 5 days supply | Qty: 5 | Fill #0

## 2019-07-03 DIAGNOSIS — L910 Hypertrophic scar: Secondary | ICD-10-CM | POA: Diagnosis not present

## 2019-07-14 ENCOUNTER — Other Ambulatory Visit: Payer: BC Managed Care – PPO

## 2019-07-14 ENCOUNTER — Other Ambulatory Visit: Payer: Self-pay

## 2019-07-14 ENCOUNTER — Other Ambulatory Visit: Payer: Self-pay | Admitting: Anesthesiology

## 2019-07-14 DIAGNOSIS — N970 Female infertility associated with anovulation: Secondary | ICD-10-CM | POA: Diagnosis not present

## 2019-07-15 LAB — PROGESTERONE: Progesterone: 1.1 ng/mL

## 2019-07-16 ENCOUNTER — Other Ambulatory Visit: Payer: Self-pay

## 2019-07-16 DIAGNOSIS — N970 Female infertility associated with anovulation: Secondary | ICD-10-CM

## 2019-07-16 MED ORDER — CLOMIPHENE CITRATE 50 MG PO TABS: ORAL_TABLET | ORAL | 0 refills | Status: DC

## 2019-07-16 MED FILL — CLOMIPHENE CITRATE 50 MG TA: 50 | 5 days supply | Qty: 10 | Fill #0

## 2019-07-17 ENCOUNTER — Other Ambulatory Visit: Payer: Self-pay | Admitting: Obstetrics & Gynecology

## 2019-07-17 NOTE — Telephone Encounter (Signed)
Annual exam is 08/28/2019.

## 2019-07-18 MED FILL — metFORMIN HCL ER 500 MG TB2: 500 | 90 days supply | Qty: 90 | Fill #0

## 2019-07-24 DIAGNOSIS — L910 Hypertrophic scar: Secondary | ICD-10-CM | POA: Diagnosis not present

## 2019-07-28 NOTE — Telephone Encounter (Signed)
Pt's LMP was 06/24/19.  Took Clomid 50mg , progesteron 1.1 (no ovulation) and today Day 35 no period. UPT at home negative.  Call in prescription for Provera to bring on a period? Please advise. You already advised to increase to 100mg  of Clomid with next cycle.  Thanks Sharrie Rothman CMA

## 2019-07-29 MED ORDER — MEDROXYPROGESTERONE ACETATE 5 MG PO TABS: 5.0000 mg | ORAL_TABLET | Freq: Every day | ORAL | 0 refills | Status: DC

## 2019-07-29 MED FILL — MEDROXYPROGESTERONE 5 MG TA: 5 | 7 days supply | Qty: 7 | Fill #0

## 2019-08-18 MED FILL — valACYclovir HCL 1 GM TABS: 1 | 60 days supply | Qty: 60 | Fill #2

## 2019-08-27 ENCOUNTER — Other Ambulatory Visit: Payer: Self-pay

## 2019-08-27 DIAGNOSIS — B349 Viral infection, unspecified: Secondary | ICD-10-CM | POA: Diagnosis not present

## 2019-08-27 DIAGNOSIS — H60391 Other infective otitis externa, right ear: Secondary | ICD-10-CM | POA: Diagnosis not present

## 2019-08-27 DIAGNOSIS — Z20828 Contact with and (suspected) exposure to other viral communicable diseases: Secondary | ICD-10-CM | POA: Diagnosis not present

## 2019-08-28 ENCOUNTER — Ambulatory Visit (INDEPENDENT_AMBULATORY_CARE_PROVIDER_SITE_OTHER): Payer: BC Managed Care – PPO | Admitting: Obstetrics & Gynecology

## 2019-08-28 ENCOUNTER — Encounter: Payer: Self-pay | Admitting: Obstetrics & Gynecology

## 2019-08-28 VITALS — BP 132/86 | Ht 67.0 in | Wt 251.0 lb

## 2019-08-28 DIAGNOSIS — N970 Female infertility associated with anovulation: Secondary | ICD-10-CM | POA: Diagnosis not present

## 2019-08-28 DIAGNOSIS — Z6839 Body mass index (BMI) 39.0-39.9, adult: Secondary | ICD-10-CM

## 2019-08-28 DIAGNOSIS — E6609 Other obesity due to excess calories: Secondary | ICD-10-CM | POA: Diagnosis not present

## 2019-08-28 DIAGNOSIS — Z01419 Encounter for gynecological examination (general) (routine) without abnormal findings: Secondary | ICD-10-CM

## 2019-08-28 MED FILL — OFLOXACIN 0.3% EAR DROPS: 0.3 | 10 days supply | Qty: 5 | Fill #0

## 2019-08-28 NOTE — Progress Notes (Signed)
[name] 02-22-1981 [id number]   History:    38 y.o.  G0 Married.  Professor of Biology  RP:  Established patient presenting for annual gyn exam   HPI: Menstrual periods regular and normal every month.  No breakthrough bleeding.  No pelvic pain.  No contraception as patient has primary infertility and is currently attempting conception.  Primary infertility with hysterosalpingography showing patent tubes. Sperm analysis that was abnormal.  Probable allergic reaction to Clomid last cycle with swelling of the lips and a skin rash.  Spade Benign Endometrial Polyp excised 09/24/2018.  Breasts normal.  Body mass index improved to 39.31.  Health labs with family physician.  Past medical history,surgical history, family history and social history were all reviewed and documented in the EPIC chart.  Gynecologic History Patient's last menstrual period was 08/10/2019.  Obstetric History OB History  Gravida Para Term Preterm AB Living  0 0 0 0 0 0  SAB TAB Ectopic Multiple Live Births  0 0 0 0 0     ROS: A ROS was performed and pertinent positives and negatives are included in the history.  GENERAL: No fevers or chills. HEENT: No change in vision, no earache, sore throat or sinus congestion. NECK: No pain or stiffness. CARDIOVASCULAR: No chest pain or pressure. No palpitations. PULMONARY: No shortness of breath, cough or wheeze. GASTROINTESTINAL: No abdominal pain, nausea, vomiting or diarrhea, melena or bright red blood per rectum. GENITOURINARY: No urinary frequency, urgency, hesitancy or dysuria. MUSCULOSKELETAL: No joint or muscle pain, no back pain, no recent trauma. DERMATOLOGIC: No rash, no itching, no lesions. ENDOCRINE: No polyuria, polydipsia, no heat or cold intolerance. No recent change in weight. HEMATOLOGICAL: No anemia or easy bruising or bleeding. NEUROLOGIC: No headache, seizures, numbness, tingling or weakness. PSYCHIATRIC: No depression, no loss of interest in normal  activity or change in sleep pattern.     Exam:   BP 132/86   Ht 5' 7"  (1.702 m)   Wt 251 lb (113.9 kg)   LMP 08/10/2019   BMI 39.31 kg/m   Body mass index is 39.31 kg/m.  General appearance : Well developed well nourished female. No acute distress HEENT: Eyes: no retinal hemorrhage or exudates,  Neck supple, trachea midline, no carotid bruits, no thyroidmegaly Lungs: Clear to auscultation, no rhonchi or wheezes, or rib retractions  Heart: Regular rate and rhythm, no murmurs or gallops Breast:Examined in sitting and supine position were symmetrical in appearance, no palpable masses or tenderness,  no skin retraction, no nipple inversion, no nipple discharge, no skin discoloration, no axillary or supraclavicular lymphadenopathy Abdomen: no palpable masses or tenderness, no rebound or guarding Extremities: no edema or skin discoloration or tenderness  Pelvic: Vulva: Normal             Vagina: No gross lesions or discharge  Cervix: No gross lesions or discharge  Uterus  AV, normal size, shape and consistency, non-tender and mobile  Adnexa  Without masses or tenderness  Anus: Normal   Assessment/Plan:  38 y.o. female for annual exam   1. Well female exam with routine gynecological exam Normal gynecologic exam.  Pap test in October 2018 was negative, will repeat at 3 years.  Breast exam normal.  We will follow-up here for fasting health labs. - CBC; Future - Comp Met (CMET); Future - TSH; Future - Lipid panel; Future - VITAMIN D 25 Hydroxy (Vit-D Deficiency, Fractures); Future  2. Anovulation Allergic reaction to Clomid.  Ovulated on Clomid 100 mg  day 3-7.  Will change to Femara next cycle if not pregnant.  3. Class 2 obesity due to excess calories without serious comorbidity with body mass index (BMI) of 39.0 to 39.9 in adult Congratulations on your weight loss since last year.  Recommend continued lower calorie/carb diet such as [employer].  Aerobic physical activities  5 times a week and weightlifting every 2 days.  Princess Bruins MD, 11:50 AM 08/28/2019

## 2019-08-28 NOTE — Patient Instructions (Signed)
1. Well female exam with routine gynecological exam Normal gynecologic exam.  Pap test in October 2018 was negative, will repeat at 3 years.  Breast exam normal.  We will follow-up here for fasting health labs. - CBC; Future - Comp Met (CMET); Future - TSH; Future - Lipid panel; Future - VITAMIN D 25 Hydroxy (Vit-D Deficiency, Fractures); Future  2. Anovulation Allergic reaction to Clomid.  Ovulated on Clomid 100 mg day 3-7.  Will change to Femara next cycle if not pregnant.  3. Class 2 obesity due to excess calories without serious comorbidity with body mass index (BMI) of 39.0 to 39.9 in adult Congratulations on your weight loss since last year.  Recommend continued lower calorie/carb diet such as [employer].  Aerobic physical activities 5 times a week and weightlifting every 2 days.  Grethel, it was a pleasure seeing you today!  I will inform you of your results as soon as they are available.

## 2019-09-02 ENCOUNTER — Other Ambulatory Visit: Payer: BC Managed Care – PPO

## 2019-09-02 ENCOUNTER — Other Ambulatory Visit: Payer: Self-pay

## 2019-09-02 DIAGNOSIS — Z01419 Encounter for gynecological examination (general) (routine) without abnormal findings: Secondary | ICD-10-CM | POA: Diagnosis not present

## 2019-09-02 DIAGNOSIS — E673 Hypervitaminosis D: Secondary | ICD-10-CM | POA: Diagnosis not present

## 2019-09-02 DIAGNOSIS — N970 Female infertility associated with anovulation: Secondary | ICD-10-CM

## 2019-09-02 DIAGNOSIS — E786 Lipoprotein deficiency: Secondary | ICD-10-CM | POA: Diagnosis not present

## 2019-09-03 DIAGNOSIS — N970 Female infertility associated with anovulation: Secondary | ICD-10-CM

## 2019-09-03 DIAGNOSIS — E559 Vitamin D deficiency, unspecified: Secondary | ICD-10-CM

## 2019-09-03 LAB — LIPID PANEL
Cholesterol: 175 mg/dL (ref <200)
HDL: 57 mg/dL (ref >=50)
LDL Cholesterol (Calc): 104 mg/dL (calc) — ABNORMAL HIGH
Non-HDL Cholesterol (Calc): 118 mg/dL (calc) (ref <130)
Total CHOL/HDL Ratio: 3.1 (calc) (ref <5.0)
Triglycerides: 58 mg/dL (ref <150)

## 2019-09-03 LAB — COMPREHENSIVE METABOLIC PANEL
AG Ratio: 1.2 (calc) (ref 1.0–2.5)
ALT: 7 U/L (ref 6–29)
AST: 10 U/L (ref 10–30)
Albumin: 3.8 g/dL (ref 3.6–5.1)
Alkaline phosphatase (APISO): 98 U/L (ref 31–125)
BUN: 11 mg/dL (ref 7–25)
CO2: 23 mmol/L (ref 20–32)
Calcium: 9.2 mg/dL (ref 8.6–10.2)
Chloride: 107 mmol/L (ref 98–110)
Creat: 0.8 mg/dL (ref 0.50–1.10)
Globulin: 3.3 g/dL (calc) (ref 1.9–3.7)
Glucose, Bld: 90 mg/dL (ref 65–99)
Potassium: 4 mmol/L (ref 3.5–5.3)
Sodium: 141 mmol/L (ref 135–146)
Total Bilirubin: 0.4 mg/dL (ref 0.2–1.2)
Total Protein: 7.1 g/dL (ref 6.1–8.1)

## 2019-09-03 LAB — CBC
HCT: 39.4 % (ref 35.0–45.0)
Hemoglobin: 12.3 g/dL (ref 11.7–15.5)
MCH: 22.6 pg — ABNORMAL LOW (ref 27.0–33.0)
MCHC: 31.2 g/dL — ABNORMAL LOW (ref 32.0–36.0)
MCV: 72.3 fL — ABNORMAL LOW (ref 80.0–100.0)
MPV: 10.8 fL (ref 7.5–12.5)
Platelets: 257 10*3/uL (ref 140–400)
RBC: 5.45 10*6/uL — ABNORMAL HIGH (ref 3.80–5.10)
RDW: 14.8 % (ref 11.0–15.0)
WBC: 11.3 10*3/uL — ABNORMAL HIGH (ref 3.8–10.8)

## 2019-09-03 LAB — VITAMIN D 25 HYDROXY (VIT D DEFICIENCY, FRACTURES): Vit D, 25-Hydroxy: 17 ng/mL — ABNORMAL LOW (ref 30–100)

## 2019-09-03 LAB — PROGESTERONE: Progesterone: 13.4 ng/mL

## 2019-09-03 LAB — TSH: TSH: 1.98 mIU/L

## 2019-09-03 MED ORDER — VITAMIN D (ERGOCALCIFEROL) 1.25 MG (50000 UNIT) PO CAPS: 50000.0000 [IU] | ORAL_CAPSULE | ORAL | 0 refills | Status: DC

## 2019-09-03 MED FILL — VIT D2 1.25 MG (50,000 UNIT: 1.25 MG | 84 days supply | Qty: 12 | Fill #0

## 2019-09-04 ENCOUNTER — Other Ambulatory Visit: Payer: Self-pay

## 2019-09-04 MED ORDER — CLOMIPHENE CITRATE 50 MG PO TABS: ORAL_TABLET | ORAL | 0 refills | Status: DC

## 2019-09-04 MED ORDER — LETROZOLE 2.5 MG PO TABS: ORAL_TABLET | ORAL | 0 refills | Status: DC

## 2019-09-04 NOTE — Addendum Note (Signed)
Addended by: Ramond Craver on: 09/04/2019 09:51 AM   Modules accepted: Orders

## 2019-09-15 MED FILL — LETROZOLE 2.5 MG TABLET: 2.5 | 5 days supply | Qty: 10 | Fill #0

## 2019-09-15 MED FILL — VIT D2 1.25 MG (50,000 UNIT: 1.25 MG | 84 days supply | Qty: 12 | Fill #0

## 2019-09-15 MED FILL — OFLOXACIN 0.3 % SOLN: 0.3 | 10 days supply | Qty: 5 | Fill #0

## 2019-09-17 ENCOUNTER — Telehealth: Payer: Self-pay | Admitting: *Deleted

## 2019-09-17 NOTE — Telephone Encounter (Signed)
I discussed the patient's current infertility cycle.  Pt's LMP 09/12/19 Began Femara on Day 4 not D3.  Will begin ovulation predictor kits on D13.  We discussed potential for IUI this cycle and the scenarios of scheduling and availability of it.  We discussed confirming ovulation with a serum progesterone 8 days after a positive surge.  We discussed reasonings of having infertility treated by a reproductive endocrinologist vs our office due to scheduling and test performing capabilities.  She understood. I will get costs for patient for [facility] to have potential IUI this cycle if dating of positive surge falls when it can be done.  With this conversation, the patient understood all.  We will talk next week when she starts her [facility] kits. KW CMA

## 2019-09-17 NOTE — Telephone Encounter (Signed)
Prior authorization for Femara 2.5 mg tablet done via cover my meds. Rx approved by [employer] effective 09/16/19 until 09/15/20. Pharmacy informed.

## 2019-09-23 ENCOUNTER — Encounter: Payer: Self-pay | Admitting: Family Medicine

## 2019-09-23 DIAGNOSIS — A6000 Herpesviral infection of urogenital system, unspecified: Secondary | ICD-10-CM

## 2019-09-23 MED ORDER — VALACYCLOVIR HCL 1 G PO TABS: 1000.0000 mg | ORAL_TABLET | Freq: Every day | ORAL | 3 refills | Status: DC

## 2019-09-30 ENCOUNTER — Telehealth: Payer: Self-pay | Admitting: *Deleted

## 2019-09-30 NOTE — Telephone Encounter (Signed)
Referral faxed to [facility] they will call patient to schedule.

## 2019-09-30 NOTE — Telephone Encounter (Signed)
-----   Message from Richardson Chiquito, CMA sent at 09/29/2019  4:06 PM EST ----- Regarding: schedule an apt for referral of care to [facility],   Can you schedule a consult apt for this patient? I have finally convinced her that it would be best for them to run her cycle as she surged Friday night with no contact to get appt scheduled for the weekend. Consult for Femara/sperm washing & IUI cycles. They did the semen analysis there on her spouse. She would really love an apt within the next 2 weeks if possible.   Thanks Sherrilyn Rist

## 2019-10-01 NOTE — Telephone Encounter (Signed)
Patient scheduled on 10/09/19 for the below.

## 2019-10-03 ENCOUNTER — Other Ambulatory Visit: Payer: BC Managed Care – PPO

## 2019-10-03 ENCOUNTER — Other Ambulatory Visit: Payer: Self-pay | Admitting: *Deleted

## 2019-10-03 ENCOUNTER — Other Ambulatory Visit: Payer: Self-pay

## 2019-10-03 DIAGNOSIS — N970 Female infertility associated with anovulation: Secondary | ICD-10-CM

## 2019-10-03 LAB — PROGESTERONE: Progesterone: 28.8 ng/mL

## 2019-10-09 DIAGNOSIS — Z3141 Encounter for fertility testing: Secondary | ICD-10-CM | POA: Diagnosis not present

## 2019-10-09 DIAGNOSIS — Z113 Encounter for screening for infections with a predominantly sexual mode of transmission: Secondary | ICD-10-CM | POA: Diagnosis not present

## 2019-10-09 DIAGNOSIS — Z319 Encounter for procreative management, unspecified: Secondary | ICD-10-CM | POA: Diagnosis not present

## 2019-10-09 DIAGNOSIS — Z0183 Encounter for blood typing: Secondary | ICD-10-CM | POA: Diagnosis not present

## 2019-10-17 ENCOUNTER — Encounter: Payer: Self-pay | Admitting: Family Medicine

## 2019-10-17 ENCOUNTER — Telehealth: Payer: Self-pay

## 2019-10-17 ENCOUNTER — Other Ambulatory Visit: Payer: Self-pay | Admitting: Obstetrics & Gynecology

## 2019-10-17 MED FILL — metFORMIN HCL ER 500 MG TB2: 500 | 90 days supply | Qty: 90 | Fill #0

## 2019-10-17 MED FILL — valACYclovir HCL 1 GM TABS: 1 | 6 days supply | Qty: 6 | Fill #0

## 2019-10-17 NOTE — Telephone Encounter (Signed)
PA initiated via Covermymeds; KEY: [id number]. PA approved.   PA Case: [id number], Status: Approved, Coverage Starts on: 10/17/2019 12:00:00 AM, Coverage Ends on: 10/16/2020 12:00:00 AM.

## 2019-10-23 DIAGNOSIS — Z113 Encounter for screening for infections with a predominantly sexual mode of transmission: Secondary | ICD-10-CM | POA: Diagnosis not present

## 2019-10-23 DIAGNOSIS — N8500 Endometrial hyperplasia, unspecified: Secondary | ICD-10-CM | POA: Diagnosis not present

## 2019-10-23 DIAGNOSIS — Z3141 Encounter for fertility testing: Secondary | ICD-10-CM | POA: Diagnosis not present

## 2019-10-24 MED FILL — DOXYCYCLINE HYCLATE 100 MG: 100 | 5 days supply | Qty: 10 | Fill #0

## 2019-10-24 MED FILL — valACYclovir HCL 1 GM TABS: 1 | 90 days supply | Qty: 90 | Fill #1

## 2019-11-06 DIAGNOSIS — E2839 Other primary ovarian failure: Secondary | ICD-10-CM | POA: Diagnosis not present

## 2019-11-06 DIAGNOSIS — Z113 Encounter for screening for infections with a predominantly sexual mode of transmission: Secondary | ICD-10-CM | POA: Diagnosis not present

## 2019-11-06 DIAGNOSIS — N979 Female infertility, unspecified: Secondary | ICD-10-CM | POA: Diagnosis not present

## 2019-11-08 DIAGNOSIS — N83292 Other ovarian cyst, left side: Secondary | ICD-10-CM | POA: Diagnosis not present

## 2019-11-08 DIAGNOSIS — N83202 Unspecified ovarian cyst, left side: Secondary | ICD-10-CM | POA: Diagnosis not present

## 2019-11-08 DIAGNOSIS — N83291 Other ovarian cyst, right side: Secondary | ICD-10-CM | POA: Diagnosis not present

## 2019-11-08 DIAGNOSIS — N801 Endometriosis of ovary: Secondary | ICD-10-CM | POA: Diagnosis not present

## 2019-11-10 ENCOUNTER — Ambulatory Visit: Payer: BLUE CROSS/BLUE SHIELD | Attending: Family

## 2019-11-10 DIAGNOSIS — Z23 Encounter for immunization: Secondary | ICD-10-CM | POA: Insufficient documentation

## 2019-11-19 DIAGNOSIS — Z113 Encounter for screening for infections with a predominantly sexual mode of transmission: Secondary | ICD-10-CM | POA: Diagnosis not present

## 2019-11-19 DIAGNOSIS — E2839 Other primary ovarian failure: Secondary | ICD-10-CM | POA: Diagnosis not present

## 2019-11-19 DIAGNOSIS — N979 Female infertility, unspecified: Secondary | ICD-10-CM | POA: Diagnosis not present

## 2019-12-09 ENCOUNTER — Ambulatory Visit: Payer: Self-pay | Attending: Internal Medicine

## 2019-12-09 DIAGNOSIS — Z23 Encounter for immunization: Secondary | ICD-10-CM

## 2019-12-09 NOTE — Progress Notes (Signed)
   Covid-19 Vaccination Clinic  Name:  YOSHIYE KRAFT    MRN: [id number] DOB: 1980/10/07  12/09/2019  Ms. Arps was observed post Covid-19 immunization for 15 minutes without incident. She was provided with Vaccine Information Sheet and instruction to access the V-Safe system.   Ms. Pizano was instructed to call 911 with any severe reactions post vaccine: [name] Difficulty breathing  . Swelling of face and throat  . A fast heartbeat  . A bad rash all over body  . Dizziness and weakness   Immunizations Administered    Name Date Dose VIS Date Route   Moderna COVID-19 Vaccine 12/09/2019  9:05 AM 0.5 mL 08/19/2019 Intramuscular   Manufacturer: Moderna   Lot: [id number]   NDC: 03403-524-81

## 2019-12-22 DIAGNOSIS — Z3141 Encounter for fertility testing: Secondary | ICD-10-CM | POA: Diagnosis not present

## 2019-12-22 DIAGNOSIS — N8500 Endometrial hyperplasia, unspecified: Secondary | ICD-10-CM | POA: Diagnosis not present

## 2019-12-23 ENCOUNTER — Ambulatory Visit: Payer: Self-pay

## 2019-12-26 DIAGNOSIS — Z113 Encounter for screening for infections with a predominantly sexual mode of transmission: Secondary | ICD-10-CM | POA: Diagnosis not present

## 2020-01-16 MED FILL — ESTRADIOL 0.1 MG PATCH: 0.1 | 28 days supply | Qty: 8 | Fill #0

## 2020-01-16 MED FILL — METFORMIN HCL ER 500 MG TB2: 500 | 90 days supply | Qty: 90 | Fill #0

## 2020-01-16 MED FILL — valACYclovir HCL 1 GM TABS: 1 | 90 days supply | Qty: 90 | Fill #0

## 2020-01-16 MED FILL — ESTRADIOL 2 MG TABS: 2 | 30 days supply | Qty: 60 | Fill #0

## 2020-02-02 DIAGNOSIS — Z113 Encounter for screening for infections with a predominantly sexual mode of transmission: Secondary | ICD-10-CM | POA: Diagnosis not present

## 2020-02-12 MED FILL — METHYLPREDNISOLONE 4 MG TAB: 4 | 2 days supply | Qty: 6 | Fill #0

## 2020-02-12 MED FILL — ESTRADIOL 0.1 MG PATCH: 0.1 | 28 days supply | Qty: 8 | Fill #1

## 2020-02-17 MED FILL — BD 3 ML SYRINGE 18GX1-1/2: 18G X 1-1/2 | 30 days supply | Qty: 30 | Fill #0

## 2020-02-20 DIAGNOSIS — Z3200 Encounter for pregnancy test, result unknown: Secondary | ICD-10-CM | POA: Diagnosis not present

## 2020-02-23 DIAGNOSIS — Z3200 Encounter for pregnancy test, result unknown: Secondary | ICD-10-CM | POA: Diagnosis not present

## 2020-03-05 MED FILL — ESTRADIOL 2 MG TABS: 2 | 30 days supply | Qty: 60 | Fill #1

## 2020-03-08 DIAGNOSIS — Z3200 Encounter for pregnancy test, result unknown: Secondary | ICD-10-CM | POA: Diagnosis not present

## 2020-03-23 DIAGNOSIS — O0900 Supervision of pregnancy with history of infertility, unspecified trimester: Secondary | ICD-10-CM | POA: Diagnosis not present

## 2020-03-29 MED FILL — PROGESTERONE MICRONIZED 200: 200 | 14 days supply | Qty: 14 | Fill #0

## 2020-04-02 DIAGNOSIS — R3911 Hesitancy of micturition: Secondary | ICD-10-CM | POA: Diagnosis not present

## 2020-04-02 DIAGNOSIS — Z6841 Body Mass Index (BMI) 40.0 and over, adult: Secondary | ICD-10-CM | POA: Diagnosis not present

## 2020-04-09 DIAGNOSIS — Z3400 Encounter for supervision of normal first pregnancy, unspecified trimester: Secondary | ICD-10-CM | POA: Diagnosis not present

## 2020-04-09 DIAGNOSIS — O26891 Other specified pregnancy related conditions, first trimester: Secondary | ICD-10-CM | POA: Diagnosis not present

## 2020-04-09 DIAGNOSIS — O09511 Supervision of elderly primigravida, first trimester: Secondary | ICD-10-CM | POA: Diagnosis not present

## 2020-04-09 DIAGNOSIS — Z3689 Encounter for other specified antenatal screening: Secondary | ICD-10-CM | POA: Diagnosis not present

## 2020-04-09 DIAGNOSIS — O10011 Pre-existing essential hypertension complicating pregnancy, first trimester: Secondary | ICD-10-CM | POA: Diagnosis not present

## 2020-04-09 DIAGNOSIS — O99212 Obesity complicating pregnancy, second trimester: Secondary | ICD-10-CM | POA: Diagnosis not present

## 2020-04-09 DIAGNOSIS — O99210 Obesity complicating pregnancy, unspecified trimester: Secondary | ICD-10-CM | POA: Diagnosis not present

## 2020-04-09 DIAGNOSIS — R030 Elevated blood-pressure reading, without diagnosis of hypertension: Secondary | ICD-10-CM | POA: Diagnosis not present

## 2020-04-09 DIAGNOSIS — O09811 Supervision of pregnancy resulting from assisted reproductive technology, first trimester: Secondary | ICD-10-CM | POA: Diagnosis not present

## 2020-04-14 ENCOUNTER — Other Ambulatory Visit (HOSPITAL_COMMUNITY): Payer: Self-pay | Admitting: Obstetrics

## 2020-04-14 ENCOUNTER — Telehealth: Payer: Self-pay

## 2020-04-14 MED FILL — METFORMIN HCL ER 500 MG TB2: 500 | 90 days supply | Qty: 90 | Fill #0

## 2020-04-14 MED FILL — valACYclovir HCL 1 GM TABS: 1 | 90 days supply | Qty: 90 | Fill #1

## 2020-04-14 NOTE — Telephone Encounter (Signed)
Received PA for medication via Covermymeds; KEY: [id number]. PA cancelled by plan, medication already available w/o PA.

## 2020-04-20 ENCOUNTER — Other Ambulatory Visit (HOSPITAL_COMMUNITY): Payer: Self-pay | Admitting: Obstetrics

## 2020-04-20 DIAGNOSIS — O10011 Pre-existing essential hypertension complicating pregnancy, first trimester: Secondary | ICD-10-CM | POA: Diagnosis not present

## 2020-04-20 DIAGNOSIS — O99212 Obesity complicating pregnancy, second trimester: Secondary | ICD-10-CM | POA: Diagnosis not present

## 2020-04-20 DIAGNOSIS — O09511 Supervision of elderly primigravida, first trimester: Secondary | ICD-10-CM | POA: Diagnosis not present

## 2020-04-20 DIAGNOSIS — O09811 Supervision of pregnancy resulting from assisted reproductive technology, first trimester: Secondary | ICD-10-CM | POA: Diagnosis not present

## 2020-04-20 MED FILL — LABETALOL HCL 100 MG TABS: 100 | 30 days supply | Qty: 90 | Fill #0

## 2020-05-19 ENCOUNTER — Other Ambulatory Visit: Payer: Self-pay

## 2020-05-19 ENCOUNTER — Other Ambulatory Visit: Payer: BC Managed Care – PPO

## 2020-05-19 DIAGNOSIS — Z20822 Contact with and (suspected) exposure to covid-19: Secondary | ICD-10-CM

## 2020-05-20 ENCOUNTER — Other Ambulatory Visit (HOSPITAL_COMMUNITY): Payer: Self-pay | Admitting: Obstetrics

## 2020-05-20 DIAGNOSIS — Z361 Encounter for antenatal screening for raised alphafetoprotein level: Secondary | ICD-10-CM | POA: Diagnosis not present

## 2020-05-20 DIAGNOSIS — O09812 Supervision of pregnancy resulting from assisted reproductive technology, second trimester: Secondary | ICD-10-CM | POA: Diagnosis not present

## 2020-05-20 DIAGNOSIS — O10012 Pre-existing essential hypertension complicating pregnancy, second trimester: Secondary | ICD-10-CM | POA: Diagnosis not present

## 2020-05-20 DIAGNOSIS — O09512 Supervision of elderly primigravida, second trimester: Secondary | ICD-10-CM | POA: Diagnosis not present

## 2020-05-20 DIAGNOSIS — Z3689 Encounter for other specified antenatal screening: Secondary | ICD-10-CM | POA: Diagnosis not present

## 2020-05-20 DIAGNOSIS — O99212 Obesity complicating pregnancy, second trimester: Secondary | ICD-10-CM | POA: Diagnosis not present

## 2020-05-21 LAB — NOVEL CORONAVIRUS, NAA: SARS-CoV-2, NAA: NOT DETECTED

## 2020-05-21 MED FILL — ACCU-CHEK SOFTCLIX LANCETS: 25 days supply | Qty: 100 | Fill #0

## 2020-05-21 MED FILL — ACCU-CHEK GUIDE W/DEVICE KI: W/DEVICE | 1 days supply | Qty: 1 | Fill #0

## 2020-05-21 MED FILL — ACCU-CHEK GUIDE STRP: 25 days supply | Qty: 100 | Fill #0

## 2020-05-21 MED FILL — FERREX 150 CAPSULE: 150 | 90 days supply | Qty: 90 | Fill #0

## 2020-05-26 DIAGNOSIS — O280 Abnormal hematological finding on antenatal screening of mother: Secondary | ICD-10-CM | POA: Diagnosis not present

## 2020-05-26 DIAGNOSIS — Z3A17 17 weeks gestation of pregnancy: Secondary | ICD-10-CM | POA: Diagnosis not present

## 2020-05-28 ENCOUNTER — Other Ambulatory Visit (HOSPITAL_COMMUNITY): Payer: Self-pay | Admitting: Obstetrics

## 2020-05-28 MED FILL — LANTUS SOLOSTAR 100 UNITS/M: 100 | 30 days supply | Qty: 3 | Fill #0

## 2020-05-28 MED FILL — UNIFINE PENTIPS 32GX5/32: 32G X 4 MM | 90 days supply | Qty: 100 | Fill #0

## 2020-05-28 MED FILL — LABETALOL HCL 100 MG TABS: 100 | 30 days supply | Qty: 90 | Fill #1

## 2020-06-02 ENCOUNTER — Other Ambulatory Visit: Payer: Self-pay

## 2020-06-02 ENCOUNTER — Encounter: Payer: BC Managed Care – PPO | Attending: Obstetrics | Admitting: Registered"

## 2020-06-02 DIAGNOSIS — O24419 Gestational diabetes mellitus in pregnancy, unspecified control: Secondary | ICD-10-CM | POA: Insufficient documentation

## 2020-06-04 ENCOUNTER — Encounter: Payer: Self-pay | Admitting: Registered"

## 2020-06-04 DIAGNOSIS — O24414 Gestational diabetes mellitus in pregnancy, insulin controlled: Secondary | ICD-10-CM | POA: Insufficient documentation

## 2020-06-04 DIAGNOSIS — O24419 Gestational diabetes mellitus in pregnancy, unspecified control: Secondary | ICD-10-CM | POA: Insufficient documentation

## 2020-06-04 NOTE — Progress Notes (Signed)
Patient was seen on 06/02/20 for Gestational Diabetes self-management class at [facility]. The following learning objectives were met by the patient during this course:   States the definition of Gestational Diabetes  States why dietary management is important in controlling blood glucose  Describes the effects each nutrient has on blood glucose levels  Demonstrates ability to create a balanced meal plan  Demonstrates carbohydrate counting   States when to check blood glucose levels  Demonstrates proper blood glucose monitoring techniques  States the effect of stress and exercise on blood glucose levels  States the importance of limiting caffeine and abstaining from alcohol and smoking  Blood glucose monitor given: None Patient obtained glucometer prior to class.  Patient instructed to monitor glucose levels: FBS: 60 - <95; 1 hour: <140; 2 hour: <120  Patient received handouts:  Nutrition Diabetes and Pregnancy, including carb counting list  Patient will be seen for follow-up as needed. 

## 2020-06-11 MED FILL — ACCU-CHEK GUIDE STRP: 25 days supply | Qty: 100 | Fill #1

## 2020-06-15 DIAGNOSIS — O24419 Gestational diabetes mellitus in pregnancy, unspecified control: Secondary | ICD-10-CM | POA: Diagnosis not present

## 2020-06-15 DIAGNOSIS — O09512 Supervision of elderly primigravida, second trimester: Secondary | ICD-10-CM | POA: Diagnosis not present

## 2020-06-15 DIAGNOSIS — Z3A20 20 weeks gestation of pregnancy: Secondary | ICD-10-CM | POA: Diagnosis not present

## 2020-06-15 DIAGNOSIS — O10012 Pre-existing essential hypertension complicating pregnancy, second trimester: Secondary | ICD-10-CM | POA: Diagnosis not present

## 2020-06-25 DIAGNOSIS — I517 Cardiomegaly: Secondary | ICD-10-CM | POA: Diagnosis not present

## 2020-06-25 DIAGNOSIS — R0600 Dyspnea, unspecified: Secondary | ICD-10-CM | POA: Diagnosis not present

## 2020-06-25 DIAGNOSIS — R000 Tachycardia, unspecified: Secondary | ICD-10-CM | POA: Diagnosis not present

## 2020-06-27 DIAGNOSIS — I517 Cardiomegaly: Secondary | ICD-10-CM | POA: Diagnosis not present

## 2020-06-27 DIAGNOSIS — R000 Tachycardia, unspecified: Secondary | ICD-10-CM | POA: Diagnosis not present

## 2020-06-29 DIAGNOSIS — O24419 Gestational diabetes mellitus in pregnancy, unspecified control: Secondary | ICD-10-CM | POA: Diagnosis not present

## 2020-06-29 DIAGNOSIS — O09512 Supervision of elderly primigravida, second trimester: Secondary | ICD-10-CM | POA: Diagnosis not present

## 2020-06-29 DIAGNOSIS — O10012 Pre-existing essential hypertension complicating pregnancy, second trimester: Secondary | ICD-10-CM | POA: Diagnosis not present

## 2020-06-29 DIAGNOSIS — Z3A22 22 weeks gestation of pregnancy: Secondary | ICD-10-CM | POA: Diagnosis not present

## 2020-07-01 DIAGNOSIS — Z3A22 22 weeks gestation of pregnancy: Secondary | ICD-10-CM | POA: Diagnosis not present

## 2020-07-01 DIAGNOSIS — O24419 Gestational diabetes mellitus in pregnancy, unspecified control: Secondary | ICD-10-CM | POA: Diagnosis not present

## 2020-07-07 ENCOUNTER — Ambulatory Visit (INDEPENDENT_AMBULATORY_CARE_PROVIDER_SITE_OTHER): Payer: BC Managed Care – PPO | Admitting: Pulmonary Disease

## 2020-07-07 ENCOUNTER — Other Ambulatory Visit: Payer: Self-pay

## 2020-07-07 ENCOUNTER — Encounter: Payer: Self-pay | Admitting: Pulmonary Disease

## 2020-07-07 ENCOUNTER — Institutional Professional Consult (permissible substitution): Payer: BC Managed Care – PPO | Admitting: Neurology

## 2020-07-07 ENCOUNTER — Other Ambulatory Visit: Payer: Self-pay | Admitting: Pulmonary Disease

## 2020-07-07 VITALS — BP 140/100 | HR 126 | Temp 98.3°F | Ht 68.0 in | Wt 285.4 lb

## 2020-07-07 DIAGNOSIS — G4733 Obstructive sleep apnea (adult) (pediatric): Secondary | ICD-10-CM

## 2020-07-07 DIAGNOSIS — Z23 Encounter for immunization: Secondary | ICD-10-CM | POA: Diagnosis not present

## 2020-07-07 MED ORDER — ALBUTEROL SULFATE HFA 108 (90 BASE) MCG/ACT IN AERS: 2.0000 | INHALATION_SPRAY | Freq: Four times a day (QID) | RESPIRATORY_TRACT | 6 refills | Status: DC | PRN

## 2020-07-07 MED ORDER — PULMICORT FLEXHALER 180 MCG/ACT IN AEPB: 2.0000 | INHALATION_SPRAY | Freq: Two times a day (BID) | RESPIRATORY_TRACT | 3 refills | Status: DC

## 2020-07-07 MED FILL — PULMICORT 180 MCG FLEXHALER: 180 | 30 days supply | Qty: 1 | Fill #0 | Status: TO

## 2020-07-07 NOTE — Patient Instructions (Signed)
Asthma exacerbation with pregnancy -Continue albuterol use up to 4 times a day as needed -I did call in Pulmicort to be used 2 puffs twice a day -This should help the wheezing -Should help the shortness of breath  Possible obstructive sleep apnea -We will schedule you for a home sleep test to diagnose sleep apnea -We cannot initiate treatment until we have a definitive diagnosis  -Sleeping the best position to get your comfortable at night  -Unfortunately with the baby's growth comes fluid retention, increased pressure on your diaphragm making it feel like you cannot breathe as well as you did previously  Call with significant concerns  We will see her in 6 weeks

## 2020-07-07 NOTE — Progress Notes (Signed)
[name]    [id number]    Jun 12, 1981  Primary Care Physician:Lowne Almeta Monas Grayling Congress, DO  Referring Physician: Zola Button, Grayling Congress, DO [address] [place],  [place] [address]  Chief complaint:   Worsening shortness of breath  HPI:  Patient with a history of childhood asthma Has been stable Over the last few weeks has been having more difficulty with breathing at night, wheezing  New history of snoring No history of apneas  Usually goes to bed about 9 PM, takes about 30 minutes to fall asleep About 3 awakenings Final wake up time 7 AM  Stated she has lost weight during this pregnancy  That with obstructive sleep apnea  Admits to dryness of the mouth in the mornings No headaches  Never smoker  Outpatient Encounter Medications as of 07/07/2020  Medication Sig  . albuterol (PROAIR HFA) 108 (90 Base) MCG/ACT inhaler Inhale 2 puffs into the lungs every 6 (six) hours as needed.  . ALPRAZolam (XANAX) 0.25 MG tablet Take 1 tablet (0.25 mg total) by mouth 3 (three) times daily as needed for sleep.  . clomiPHENE (CLOMID) 50 MG tablet Take two tabs po daily Day 3-7 of cycle.  . cyclobenzaprine (FLEXERIL) 10 MG tablet Take 1 tablet (10 mg total) by mouth 3 (three) times daily as needed for muscle spasms.  [name] ibuprofen (ADVIL,MOTRIN) 200 MG tablet Take by mouth.  . labetalol (NORMODYNE) 100 MG tablet Take 1 tablet by mouth every 8 (eight) hours.  [name] LANTUS SOLOSTAR 100 UNIT/ML Solostar Pen Inject into the skin.  [name] letrozole (FEMARA) 2.5 MG tablet Take 2 tabs po daily Day 3-7 of cycle.  . metFORMIN (GLUCOPHAGE-XR) 500 MG 24 hr tablet TAKE 1 TABLET (500 MG TOTAL) BY MOUTH DAILY WITH BREAKFAST.  [name] nitrofurantoin (MACRODANTIN) 50 MG capsule Take with intercourse as needed  . Prenatal Vit-Fe Fumarate-FA (PRENATAL MULTIVITAMIN) TABS tablet Take 1 tablet by mouth daily at 12 noon.  . traMADol (ULTRAM) 50 MG tablet Take by mouth.  . valACYclovir (VALTREX)  1000 MG tablet Take 1 tablet (1,000 mg total) by mouth daily.  . Vitamin D, Ergocalciferol, (DRISDOL) 1.25 MG (50000 UT) CAPS capsule Take 1 capsule (50,000 Units total) by mouth every 7 (seven) days.  [name] albuterol (VENTOLIN HFA) 108 (90 Base) MCG/ACT inhaler Inhale 2 puffs into the lungs every 6 (six) hours as needed for wheezing or shortness of breath.  . budesonide (PULMICORT FLEXHALER) 180 MCG/ACT inhaler Inhale 2 puffs into the lungs every 12 (twelve) hours.   No facility-administered encounter medications on file as of 07/07/2020.    Allergies as of 07/07/2020 - Review Complete 07/07/2020  Allergen Reaction Noted  . Clomiphene Hives 09/04/2019    Past Medical History:  Diagnosis Date  . Anemia   . Asthma   . Genital herpes   . Obesity   . Pre-diabetes   . Thyroid disease     Past Surgical History:  Procedure Laterality Date  . DILATATION & CURETTAGE/HYSTEROSCOPY WITH MYOSURE N/A 09/24/2018   Procedure: DILATATION & CURETTAGE/HYSTEROSCOPY WITH MYOSURE POLYPECTOMY;  Surgeon: [name], MD;  Location: [facility];  Service: Gynecology;  Laterality: N/A;  . KELOID EXCISION  2008   removed @[facility] ----- 10/2011  . KELOID EXCISION  09/20/2018   behind right ear  . 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 Hyperlipidemia  Mother   . Hypertension Mother   . Depression Father   . Hyperlipidemia Father   . Heart disease Father 39       MI  . Hypertension Father   . Cancer Father 23       prostate  . Cancer Paternal Grandfather        prostate  . Coronary artery disease Other   . Prostate cancer Other   . Cancer Maternal Grandfather        prostate    Social History   Socioeconomic History  . Marital status: Single    Spouse name: Not on file  . Number of children: Not on file  . Years of education: Not on file  . Highest education level: Not on file  Occupational History  . 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 Smoking status: Never Smoker  . Smokeless tobacco: Never Used  Vaping Use  . Vaping Use: Never used  Substance and Sexual Activity  . Alcohol use: Yes    Comment: SOCIAL   . Drug use: No  . Sexual activity: Yes    Partners: Male    Birth control/protection: None    Comment: 1st intercourse- 41, partners- 10, current partner- 4 yrs   Other Topics Concern  . Not on file  Social History Narrative  . Not on file   Social Determinants of Health   Financial Resource Strain:   . Difficulty of Paying Living Expenses: Not on file  Food Insecurity: No Food Insecurity  . Worried About Programme researcher, broadcasting/film/video in the Last Year: Never true  . Ran Out of Food in the Last Year: Never true  Transportation Needs:   . Lack of Transportation (Medical): Not on file  . Lack of Transportation (Non-Medical): Not on file  Physical Activity:   . Days of Exercise per Week: Not on file  . Minutes of Exercise per Session: Not on file  Stress:   . Feeling of Stress : Not on file  Social Connections:   . Frequency of Communication with Friends and Family: Not on file  . Frequency of Social Gatherings with Friends and Family: Not on file  . Attends Religious Services: Not on file  . Active Member of Clubs or Organizations: Not on file  . Attends Banker Meetings: Not on file  . Marital Status: Not on file  Intimate Partner Violence:   . Fear of Current or Ex-Partner: Not on file  . Emotionally Abused: Not on file  . Physically Abused: Not on file  . Sexually Abused: Not on file    Review of Systems  Respiratory: Positive for shortness of breath and wheezing.   Psychiatric/Behavioral: Positive for sleep disturbance.    Vitals:   07/07/20 1137  BP: (!) 140/100  Pulse: (!) 126  Temp: 98.3 F (36.8 C)  SpO2: 96%     Physical Exam Constitutional:      Appearance: She is obese.  HENT:     Head: Normocephalic.      Mouth/Throat:     Mouth: Mucous membranes are moist.     Comments: Crowded oropharynx, Mallampati 4, macroglossia Eyes:     General:        Right eye: No discharge.        Left eye: No discharge.  Cardiovascular:     Rate and Rhythm: Normal rate and regular rhythm.     Pulses: Normal pulses.  Heart sounds: Normal heart sounds. No murmur heard.  No friction rub.  Pulmonary:     Effort: No respiratory distress.     Breath sounds: No stridor. No wheezing or rhonchi.  Musculoskeletal:     Cervical back: No rigidity or tenderness.  Neurological:     Mental Status: She is alert.  Psychiatric:        Mood and Affect: Mood normal.    Results of the Epworth flowsheet 07/07/2020  Sitting and reading 0  Watching TV 1  Sitting, inactive in a public place (e.g. a theatre or a meeting) 0  As a passenger in a car for an hour without a break 1  Lying down to rest in the afternoon when circumstances permit 3  Sitting and talking to someone 0  Sitting quietly after a lunch without alcohol 0  In a car, while stopped for a few minutes in traffic 0  Total score 5    Assessment:  Exacerbation of asthma -Continue albuterol as needed -Add Pulmicort 2 puffs twice daily  Moderate probability of significant obstructive sleep apnea -We will order a home sleep study  Plan/Recommendations: Home sleep study  Bronchodilator treatment  Behavioral modifications-sleep position modification to accommodate possible restrictive respiratory physiology in third trimester pregnancy  Administration of a flu shot today  Tentatively we will follow-up in 6 weeks  Did not seem very happy with our interaction today as she feels we should be able to do more to help -I did try to explain the reasoning why she may be feeling more short of breath at present -Management of asthma as optimally as we can -Attempt to diagnose sleep disordered breathing and treat aggressively as best as we can   Virl Diamond MD [facility] 07/07/2020, 12:07 PM  CC: Donato Schultz, *

## 2020-07-09 ENCOUNTER — Telehealth: Payer: Self-pay | Admitting: Pulmonary Disease

## 2020-07-09 MED FILL — PULMICORT 180 MCG FLEXHALER: 180 | 30 days supply | Qty: 1 | Fill #0

## 2020-07-09 NOTE — Telephone Encounter (Signed)
PA request was received from (pharmacy): Yes Phone: [phone] Fax: [phone] Medication name and strength: Pulmicort Ordering Provider: AO  Was PA started with CMM?: Yes If yes, please enter KEY: [id number] Medication tried and failed: N/A Covered Alternatives: budesonide, fluticasone-salmeterol, Flovent HFA, Flovent Diskus, Asmanex 60 and 120 and Dulera  PA was instantly approved until 07/09/2021. Will let the pharmacy and patient know about the approval.

## 2020-07-12 ENCOUNTER — Telehealth: Payer: Self-pay

## 2020-07-12 NOTE — Telephone Encounter (Signed)
Pt pulmicort 180 mcg flexhaler has been approveved effective from 10 /22/2021- 07/09/2021

## 2020-07-13 ENCOUNTER — Other Ambulatory Visit (HOSPITAL_COMMUNITY): Payer: Self-pay | Admitting: Obstetrics

## 2020-07-13 DIAGNOSIS — O24419 Gestational diabetes mellitus in pregnancy, unspecified control: Secondary | ICD-10-CM | POA: Diagnosis not present

## 2020-07-13 DIAGNOSIS — Z3A24 24 weeks gestation of pregnancy: Secondary | ICD-10-CM | POA: Diagnosis not present

## 2020-07-13 DIAGNOSIS — O09512 Supervision of elderly primigravida, second trimester: Secondary | ICD-10-CM | POA: Diagnosis not present

## 2020-07-13 DIAGNOSIS — O10012 Pre-existing essential hypertension complicating pregnancy, second trimester: Secondary | ICD-10-CM | POA: Diagnosis not present

## 2020-07-13 MED FILL — valACYclovir HCL 1 GM TABS: 1 | 90 days supply | Qty: 90 | Fill #0

## 2020-07-13 MED FILL — LABETALOL HCL 100 MG TABS: 100 | 30 days supply | Qty: 90 | Fill #2

## 2020-07-13 MED FILL — NIFEdipine ER OSMOTIC RELEA: 30 | 90 days supply | Qty: 90 | Fill #0

## 2020-07-16 ENCOUNTER — Other Ambulatory Visit (HOSPITAL_BASED_OUTPATIENT_CLINIC_OR_DEPARTMENT_OTHER): Payer: Self-pay | Admitting: Internal Medicine

## 2020-07-16 ENCOUNTER — Ambulatory Visit: Payer: BC Managed Care – PPO | Attending: Internal Medicine

## 2020-07-16 DIAGNOSIS — Z23 Encounter for immunization: Secondary | ICD-10-CM

## 2020-07-19 NOTE — Progress Notes (Signed)
   Covid-19 Vaccination Clinic  Name:  [name]    MRN: [id number] DOB: [birth year]  07/19/2020  [name] was observed post Covid-19 immunization for 15 minutes without incident. She was provided with Vaccine Information Sheet and instruction to access the V-Safe system.   Ms. Pair was instructed to call 911 with any severe reactions post vaccine: [name] Difficulty breathing  . Swelling of face and throat  . A fast heartbeat  . A bad rash all over body  . Dizziness and weakness

## 2020-07-22 NOTE — Telephone Encounter (Signed)
Dr. Wynona Neat, Please see patient comment regarding inhaler and pregnancy and advise.  Thank you.

## 2020-07-23 ENCOUNTER — Other Ambulatory Visit: Payer: Self-pay | Admitting: Pulmonary Disease

## 2020-07-23 MED ORDER — NYSTATIN 100000 UNIT/ML MT SUSP: 5.0000 mL | Freq: Four times a day (QID) | OROMUCOSAL | 1 refills | Status: DC

## 2020-07-23 MED FILL — NYSTATIN 100,000 UNITS/ML S: 100000 | 3 days supply | Qty: 60 | Fill #0

## 2020-07-23 MED FILL — MODERNA COVID-19 VACCINE 10: 100 | 1 days supply | Qty: 0 | Fill #0

## 2020-07-23 NOTE — Telephone Encounter (Signed)
Spoke with pt and informed that medication was called in for her. Pt stated understanding. Nothing further needed at this time.

## 2020-07-23 NOTE — Telephone Encounter (Signed)
[facility]

## 2020-07-23 NOTE — Telephone Encounter (Signed)
Dr. Wynona Neat please advise on the following My Chart message:  I have thrush on my tongue, roof of mouth and back of my throat can you write me a script for something without me having to come in?  Thank you

## 2020-07-27 DIAGNOSIS — O24419 Gestational diabetes mellitus in pregnancy, unspecified control: Secondary | ICD-10-CM | POA: Diagnosis not present

## 2020-07-27 DIAGNOSIS — O10012 Pre-existing essential hypertension complicating pregnancy, second trimester: Secondary | ICD-10-CM | POA: Diagnosis not present

## 2020-07-27 DIAGNOSIS — Z3A26 26 weeks gestation of pregnancy: Secondary | ICD-10-CM | POA: Diagnosis not present

## 2020-08-02 ENCOUNTER — Other Ambulatory Visit: Payer: Self-pay | Admitting: Obstetrics

## 2020-08-02 DIAGNOSIS — O09812 Supervision of pregnancy resulting from assisted reproductive technology, second trimester: Secondary | ICD-10-CM

## 2020-08-06 ENCOUNTER — Other Ambulatory Visit: Payer: Self-pay

## 2020-08-06 ENCOUNTER — Ambulatory Visit: Payer: BC Managed Care – PPO

## 2020-08-06 DIAGNOSIS — G4733 Obstructive sleep apnea (adult) (pediatric): Secondary | ICD-10-CM

## 2020-08-06 MED FILL — ACCU-CHEK GUIDE STRP: 25 days supply | Qty: 100 | Fill #2

## 2020-08-10 DIAGNOSIS — G4733 Obstructive sleep apnea (adult) (pediatric): Secondary | ICD-10-CM | POA: Diagnosis not present

## 2020-08-11 ENCOUNTER — Telehealth: Payer: Self-pay | Admitting: Pulmonary Disease

## 2020-08-11 DIAGNOSIS — O10013 Pre-existing essential hypertension complicating pregnancy, third trimester: Secondary | ICD-10-CM | POA: Diagnosis not present

## 2020-08-11 DIAGNOSIS — Z3A28 28 weeks gestation of pregnancy: Secondary | ICD-10-CM | POA: Diagnosis not present

## 2020-08-11 DIAGNOSIS — Z23 Encounter for immunization: Secondary | ICD-10-CM | POA: Diagnosis not present

## 2020-08-11 DIAGNOSIS — Z3689 Encounter for other specified antenatal screening: Secondary | ICD-10-CM | POA: Diagnosis not present

## 2020-08-11 DIAGNOSIS — O09512 Supervision of elderly primigravida, second trimester: Secondary | ICD-10-CM | POA: Diagnosis not present

## 2020-08-11 DIAGNOSIS — O99210 Obesity complicating pregnancy, unspecified trimester: Secondary | ICD-10-CM | POA: Diagnosis not present

## 2020-08-11 DIAGNOSIS — G4733 Obstructive sleep apnea (adult) (pediatric): Secondary | ICD-10-CM

## 2020-08-11 DIAGNOSIS — O24414 Gestational diabetes mellitus in pregnancy, insulin controlled: Secondary | ICD-10-CM | POA: Diagnosis not present

## 2020-08-11 DIAGNOSIS — O99013 Anemia complicating pregnancy, third trimester: Secondary | ICD-10-CM | POA: Diagnosis not present

## 2020-08-11 MED FILL — LABETALOL HCL 100 MG TABS: 100 | 30 days supply | Qty: 90 | Fill #0

## 2020-08-11 NOTE — Telephone Encounter (Signed)
HST results printed and put it mail for pt. Nothing further needed.

## 2020-08-11 NOTE — Telephone Encounter (Signed)
Called and spoke to pt. Informed her of the results and recs per AO. Order placed. Appt made for 09/28/20 with Buelah Manis, NP. Pt verbalized understanding and states she would also like a copy of the study. Pt requesting referral be placed as urgent as she is 7 months pregnant.    Will leave in triage for sleep study to be printed and mailed to pt's home.   Will forward to PCC's to send order to DME asap for pt to get her CPAP asap as she has severe OSA and is pregnant.

## 2020-08-11 NOTE — Telephone Encounter (Signed)
I have sent order for cpap to [facility] as high priority due to pregnancy.  Nothing further needed.

## 2020-08-11 NOTE — Telephone Encounter (Signed)
Called and left detailed message for pt that Jan appt has been kept and December appt has been cancelled.   Will keep message open to have triage print and mail pt the HST. Can close message once complete.

## 2020-08-11 NOTE — Telephone Encounter (Addendum)
While giving pt the results she questioned if she can cancel her 08/19/20 appt and just keep her 09/28/20 cpap f/u appt. Pt states her breathing is doing well and would feel ok waiting until her Jan appt to f/u with her breathing.   Dr. Wynona Neat, please advise. Thanks.

## 2020-08-11 NOTE — Telephone Encounter (Signed)
That should be fine to keep January appt

## 2020-08-11 NOTE — Telephone Encounter (Signed)
Call patient  Sleep study result  Date of study: 08/08/2020  Impression: Severe obstructive sleep apnea  mild oxygen desaturations   Recommendation: DME referral  Recommend CPAP therapy for severe obstructive sleep apnea  Auto titrating CPAP with pressure settings of 5- 20 will be appropriate  Encourage weight loss measures  Follow-up in the office 4 to 6 weeks following initiation of treatment

## 2020-08-16 NOTE — Telephone Encounter (Signed)
Nasal steroid should help  They are all over-the-counter  Flonase or Nasonex or any other nasal steroid should help It does take a few days of regular use to see effect

## 2020-08-16 NOTE — Telephone Encounter (Signed)
Dr. Wynona Neat,  This message came for you this morning.   Hello I have a thick phlegm that develops in the back of throat and nasal cavity only at night when laying down. It causes me to feel like I am choking  or suffocating until it is cleared. I have to clear it all night. Is there a nasal spray or something you can write a prescription  for to help break this phlegm down to give me relief so I can sleep at night?

## 2020-08-17 ENCOUNTER — Encounter: Payer: Self-pay | Admitting: *Deleted

## 2020-08-19 ENCOUNTER — Encounter: Payer: Self-pay | Admitting: *Deleted

## 2020-08-19 ENCOUNTER — Other Ambulatory Visit: Payer: Self-pay

## 2020-08-19 ENCOUNTER — Ambulatory Visit: Payer: BC Managed Care – PPO | Admitting: Pulmonary Disease

## 2020-08-19 ENCOUNTER — Ambulatory Visit: Payer: BC Managed Care – PPO | Attending: Obstetrics

## 2020-08-19 ENCOUNTER — Ambulatory Visit: Payer: BC Managed Care – PPO | Admitting: *Deleted

## 2020-08-19 VITALS — BP 150/86 | HR 122

## 2020-08-19 DIAGNOSIS — O10013 Pre-existing essential hypertension complicating pregnancy, third trimester: Secondary | ICD-10-CM

## 2020-08-19 DIAGNOSIS — Z3A30 30 weeks gestation of pregnancy: Secondary | ICD-10-CM

## 2020-08-19 DIAGNOSIS — O09523 Supervision of elderly multigravida, third trimester: Secondary | ICD-10-CM | POA: Insufficient documentation

## 2020-08-19 DIAGNOSIS — O09812 Supervision of pregnancy resulting from assisted reproductive technology, second trimester: Secondary | ICD-10-CM | POA: Insufficient documentation

## 2020-08-19 DIAGNOSIS — O09813 Supervision of pregnancy resulting from assisted reproductive technology, third trimester: Secondary | ICD-10-CM | POA: Diagnosis not present

## 2020-08-19 DIAGNOSIS — O99513 Diseases of the respiratory system complicating pregnancy, third trimester: Secondary | ICD-10-CM

## 2020-08-19 DIAGNOSIS — Z363 Encounter for antenatal screening for malformations: Secondary | ICD-10-CM

## 2020-08-19 DIAGNOSIS — O99213 Obesity complicating pregnancy, third trimester: Secondary | ICD-10-CM | POA: Diagnosis not present

## 2020-08-19 DIAGNOSIS — O09513 Supervision of elderly primigravida, third trimester: Secondary | ICD-10-CM

## 2020-08-19 DIAGNOSIS — O24419 Gestational diabetes mellitus in pregnancy, unspecified control: Secondary | ICD-10-CM | POA: Diagnosis not present

## 2020-08-19 DIAGNOSIS — J45909 Unspecified asthma, uncomplicated: Secondary | ICD-10-CM

## 2020-08-19 NOTE — Progress Notes (Signed)
States" last dose Labetalol 10 pm 08/18/20."

## 2020-08-23 ENCOUNTER — Telehealth: Payer: Self-pay | Admitting: Pulmonary Disease

## 2020-08-23 NOTE — Telephone Encounter (Signed)
[name] is in the office tomorrow. Please schedule with him or another provider to be seen. She needs to seek emergency care if she worsens this afternoon or overnight  or develops shortness of breath and wheezing that does not resolve after medication.

## 2020-08-23 NOTE — Telephone Encounter (Signed)
Called message and routed to triage

## 2020-08-23 NOTE — Telephone Encounter (Signed)
Called and spoke with patient she is not scheduled for Wednesday at 9am as there was nothing available for tomorrow. Advise to go to ED if symptoms got worse. She expressed understanding. Nothing further needed at this time.

## 2020-08-23 NOTE — Telephone Encounter (Signed)
Primary Pulmonologist: Olalere  Last office visit and with whom: 07/07/20  What do we see them for (pulmonary problems): OSA (obstructive sleep apnea) Last OV assessment/plan:  Asthma exacerbation with pregnancy -Continue albuterol use up to 4 times a day as needed -I did call in Pulmicort to be used 2 puffs twice a day -This should help the wheezing -Should help the shortness of breath  Possible obstructive sleep apnea -We will schedule you for a home sleep test to diagnose sleep apnea -We cannot initiate treatment until we have a definitive diagnosis  -Sleeping the best position to get your comfortable at night  -Unfortunately with the baby's growth comes fluid retention, increased pressure on your diaphragm making it feel like you cannot breathe as well as you did previously  Call with significant concerns  We will see her in 6 weeks     Instructions  Asthma exacerbation with pregnancy -Continue albuterol use up to 4 times a day as needed -I did call in Pulmicort to be used 2 puffs twice a day -This should help the wheezing -Should help the shortness of breath  Possible obstructive sleep apnea -We will schedule you for a home sleep test to diagnose sleep apnea -We cannot initiate treatment until we have a definitive diagnosis  -Sleeping the best position to get your comfortable at night  -Unfortunately with the baby's growth comes fluid retention, increased pressure on your diaphragm making it feel like you cannot breathe as well as you did previously  Call with significant concerns  We will see her in 6 weeks          Reason for call: Increased SOB at any time. O2 sat 98% - 99% on RA,was not been able to sleep last night, using albuterol inhaler but not effective, ongoing for 2 days, no fever, N/V/D, using Pulmicort 2 puffs every 12 hours.  Dr. Wynona Neat please advise   (examples of things to ask: : When did symptoms start? Fever? Cough? Productive?  Color to sputum? More sputum than usual? Wheezing? Have you needed increased oxygen? Are you taking your respiratory medications? What over the counter measures have you tried?)  Allergies  Allergen Reactions  . Clomiphene Hives    Hives and lips swelling.    Immunization History  Administered Date(s) Administered  . Hpv 05/16/2006, 08/06/2006, 03/11/2007  . Influenza Whole 07/02/2008, 07/13/2009  . Influenza,inj,Quad PF,6+ Mos 07/07/2020  . Moderna SARS-COV2 Booster Vaccination 07/16/2020  . Moderna SARS-COVID-2 Vaccination 11/10/2019, 12/09/2019  . Td 02/17/1999, 11/10/2010

## 2020-08-24 ENCOUNTER — Inpatient Hospital Stay (HOSPITAL_COMMUNITY): Payer: BC Managed Care – PPO

## 2020-08-24 ENCOUNTER — Inpatient Hospital Stay (HOSPITAL_COMMUNITY)
Admission: AD | Admit: 2020-08-24 | Discharge: 2020-09-01 | DRG: 786 | Disposition: A | Discharge status: Home / Self Care | Payer: BC Managed Care – PPO | Attending: Obstetrics and Gynecology | Admitting: Obstetrics and Gynecology

## 2020-08-24 ENCOUNTER — Encounter (HOSPITAL_COMMUNITY): Payer: Self-pay | Admitting: Obstetrics & Gynecology

## 2020-08-24 ENCOUNTER — Other Ambulatory Visit: Payer: Self-pay

## 2020-08-24 DIAGNOSIS — Z8249 Family history of ischemic heart disease and other diseases of the circulatory system: Secondary | ICD-10-CM

## 2020-08-24 DIAGNOSIS — I517 Cardiomegaly: Secondary | ICD-10-CM | POA: Diagnosis not present

## 2020-08-24 DIAGNOSIS — O99891 Other specified diseases and conditions complicating pregnancy: Secondary | ICD-10-CM | POA: Diagnosis not present

## 2020-08-24 DIAGNOSIS — O26833 Pregnancy related renal disease, third trimester: Secondary | ICD-10-CM | POA: Diagnosis not present

## 2020-08-24 DIAGNOSIS — O99214 Obesity complicating childbirth: Secondary | ICD-10-CM | POA: Diagnosis present

## 2020-08-24 DIAGNOSIS — I5041 Acute combined systolic (congestive) and diastolic (congestive) heart failure: Secondary | ICD-10-CM | POA: Diagnosis present

## 2020-08-24 DIAGNOSIS — I5082 Biventricular heart failure: Secondary | ICD-10-CM | POA: Diagnosis not present

## 2020-08-24 DIAGNOSIS — O99513 Diseases of the respiratory system complicating pregnancy, third trimester: Secondary | ICD-10-CM

## 2020-08-24 DIAGNOSIS — Z3A31 31 weeks gestation of pregnancy: Secondary | ICD-10-CM | POA: Diagnosis not present

## 2020-08-24 DIAGNOSIS — M7989 Other specified soft tissue disorders: Secondary | ICD-10-CM | POA: Diagnosis not present

## 2020-08-24 DIAGNOSIS — O10013 Pre-existing essential hypertension complicating pregnancy, third trimester: Secondary | ICD-10-CM | POA: Diagnosis not present

## 2020-08-24 DIAGNOSIS — I110 Hypertensive heart disease with heart failure: Secondary | ICD-10-CM | POA: Diagnosis not present

## 2020-08-24 DIAGNOSIS — O99354 Diseases of the nervous system complicating childbirth: Secondary | ICD-10-CM | POA: Diagnosis not present

## 2020-08-24 DIAGNOSIS — I509 Heart failure, unspecified: Secondary | ICD-10-CM

## 2020-08-24 DIAGNOSIS — I5020 Unspecified systolic (congestive) heart failure: Secondary | ICD-10-CM

## 2020-08-24 DIAGNOSIS — R0602 Shortness of breath: Secondary | ICD-10-CM | POA: Diagnosis present

## 2020-08-24 DIAGNOSIS — O99213 Obesity complicating pregnancy, third trimester: Secondary | ICD-10-CM | POA: Diagnosis not present

## 2020-08-24 DIAGNOSIS — O24414 Gestational diabetes mellitus in pregnancy, insulin controlled: Secondary | ICD-10-CM | POA: Diagnosis not present

## 2020-08-24 DIAGNOSIS — Z3493 Encounter for supervision of normal pregnancy, unspecified, third trimester: Secondary | ICD-10-CM | POA: Diagnosis not present

## 2020-08-24 DIAGNOSIS — E876 Hypokalemia: Secondary | ICD-10-CM | POA: Diagnosis not present

## 2020-08-24 DIAGNOSIS — Z3A30 30 weeks gestation of pregnancy: Secondary | ICD-10-CM | POA: Diagnosis not present

## 2020-08-24 DIAGNOSIS — G4733 Obstructive sleep apnea (adult) (pediatric): Secondary | ICD-10-CM | POA: Diagnosis not present

## 2020-08-24 DIAGNOSIS — O99284 Endocrine, nutritional and metabolic diseases complicating childbirth: Secondary | ICD-10-CM | POA: Diagnosis not present

## 2020-08-24 DIAGNOSIS — O9942 Diseases of the circulatory system complicating childbirth: Principal | ICD-10-CM | POA: Diagnosis present

## 2020-08-24 DIAGNOSIS — R7989 Other specified abnormal findings of blood chemistry: Secondary | ICD-10-CM | POA: Diagnosis present

## 2020-08-24 DIAGNOSIS — J45909 Unspecified asthma, uncomplicated: Secondary | ICD-10-CM | POA: Diagnosis not present

## 2020-08-24 DIAGNOSIS — R570 Cardiogenic shock: Secondary | ICD-10-CM | POA: Diagnosis not present

## 2020-08-24 DIAGNOSIS — R609 Edema, unspecified: Secondary | ICD-10-CM | POA: Diagnosis not present

## 2020-08-24 DIAGNOSIS — Z79899 Other long term (current) drug therapy: Secondary | ICD-10-CM

## 2020-08-24 DIAGNOSIS — N170 Acute kidney failure with tubular necrosis: Secondary | ICD-10-CM | POA: Diagnosis not present

## 2020-08-24 DIAGNOSIS — R110 Nausea: Secondary | ICD-10-CM | POA: Diagnosis not present

## 2020-08-24 DIAGNOSIS — O1002 Pre-existing essential hypertension complicating childbirth: Secondary | ICD-10-CM | POA: Diagnosis not present

## 2020-08-24 DIAGNOSIS — J811 Chronic pulmonary edema: Secondary | ICD-10-CM | POA: Diagnosis not present

## 2020-08-24 DIAGNOSIS — I5033 Acute on chronic diastolic (congestive) heart failure: Secondary | ICD-10-CM | POA: Diagnosis not present

## 2020-08-24 DIAGNOSIS — O328XX0 Maternal care for other malpresentation of fetus, not applicable or unspecified: Secondary | ICD-10-CM | POA: Diagnosis present

## 2020-08-24 DIAGNOSIS — I10 Essential (primary) hypertension: Secondary | ICD-10-CM | POA: Diagnosis not present

## 2020-08-24 DIAGNOSIS — O9952 Diseases of the respiratory system complicating childbirth: Secondary | ICD-10-CM | POA: Diagnosis present

## 2020-08-24 DIAGNOSIS — O10113 Pre-existing hypertensive heart disease complicating pregnancy, third trimester: Secondary | ICD-10-CM | POA: Diagnosis not present

## 2020-08-24 DIAGNOSIS — J90 Pleural effusion, not elsewhere classified: Secondary | ICD-10-CM | POA: Diagnosis not present

## 2020-08-24 DIAGNOSIS — I420 Dilated cardiomyopathy: Secondary | ICD-10-CM | POA: Diagnosis not present

## 2020-08-24 DIAGNOSIS — I5043 Acute on chronic combined systolic (congestive) and diastolic (congestive) heart failure: Secondary | ICD-10-CM | POA: Diagnosis not present

## 2020-08-24 DIAGNOSIS — R748 Abnormal levels of other serum enzymes: Secondary | ICD-10-CM | POA: Diagnosis present

## 2020-08-24 DIAGNOSIS — O24424 Gestational diabetes mellitus in childbirth, insulin controlled: Secondary | ICD-10-CM | POA: Diagnosis present

## 2020-08-24 DIAGNOSIS — O114 Pre-existing hypertension with pre-eclampsia, complicating childbirth: Secondary | ICD-10-CM | POA: Diagnosis present

## 2020-08-24 DIAGNOSIS — Z20822 Contact with and (suspected) exposure to covid-19: Secondary | ICD-10-CM | POA: Diagnosis not present

## 2020-08-24 DIAGNOSIS — Z833 Family history of diabetes mellitus: Secondary | ICD-10-CM

## 2020-08-24 DIAGNOSIS — R0989 Other specified symptoms and signs involving the circulatory and respiratory systems: Secondary | ICD-10-CM | POA: Diagnosis present

## 2020-08-24 DIAGNOSIS — E871 Hypo-osmolality and hyponatremia: Secondary | ICD-10-CM | POA: Diagnosis present

## 2020-08-24 DIAGNOSIS — I5031 Acute diastolic (congestive) heart failure: Secondary | ICD-10-CM | POA: Diagnosis not present

## 2020-08-24 DIAGNOSIS — O1203 Gestational edema, third trimester: Secondary | ICD-10-CM

## 2020-08-24 DIAGNOSIS — O99413 Diseases of the circulatory system complicating pregnancy, third trimester: Secondary | ICD-10-CM | POA: Diagnosis not present

## 2020-08-24 DIAGNOSIS — Z809 Family history of malignant neoplasm, unspecified: Secondary | ICD-10-CM

## 2020-08-24 DIAGNOSIS — Z83438 Family history of other disorder of lipoprotein metabolism and other lipidemia: Secondary | ICD-10-CM

## 2020-08-24 DIAGNOSIS — O09513 Supervision of elderly primigravida, third trimester: Secondary | ICD-10-CM | POA: Diagnosis not present

## 2020-08-24 HISTORY — DX: Gestational diabetes mellitus in pregnancy, unspecified control: O24.419

## 2020-08-24 LAB — CBC
HCT: 35.9 % — ABNORMAL LOW (ref 36.0–46.0)
Hemoglobin: 11.4 g/dL — ABNORMAL LOW (ref 12.0–15.0)
MCH: 23.8 pg — ABNORMAL LOW (ref 26.0–34.0)
MCHC: 31.8 g/dL (ref 30.0–36.0)
MCV: 74.9 fL — ABNORMAL LOW (ref 80.0–100.0)
Platelets: 360 10*3/uL (ref 150–400)
RBC: 4.79 MIL/uL (ref 3.87–5.11)
RDW: 16.7 % — ABNORMAL HIGH (ref 11.5–15.5)
WBC: 15.1 10*3/uL — ABNORMAL HIGH (ref 4.0–10.5)
nRBC: 0.3 % — ABNORMAL HIGH (ref 0.0–0.2)

## 2020-08-24 LAB — TROPONIN I (HIGH SENSITIVITY): Troponin I (High Sensitivity): 13 ng/L (ref <18)

## 2020-08-24 LAB — ECHOCARDIOGRAM COMPLETE
AR max vel: 1.53 cm2
AV Area VTI: 1.46 cm2
AV Area mean vel: 1.43 cm2
AV Mean grad: 2 mmHg
AV Peak grad: 2.8 mmHg
Ao pk vel: 0.84 m/s
Height: 68 in
MV M vel: 4.26 m/s
MV Peak grad: 72.6 mmHg
Radius: 0.4 cm
S' Lateral: 5.56 cm
Weight: 4697.6 oz

## 2020-08-24 LAB — URINALYSIS, ROUTINE W REFLEX MICROSCOPIC
Glucose, UA: NEGATIVE mg/dL
Hgb urine dipstick: NEGATIVE
Ketones, ur: NEGATIVE mg/dL
Nitrite: NEGATIVE
Protein, ur: 100 mg/dL — AB
Specific Gravity, Urine: 1.024 (ref 1.005–1.030)
pH: 5 (ref 5.0–8.0)

## 2020-08-24 LAB — TYPE AND SCREEN
ABO/RH(D): O POS
Antibody Screen: NEGATIVE

## 2020-08-24 LAB — PROTEIN / CREATININE RATIO, URINE
Creatinine, Urine: 478.04 mg/dL
Protein Creatinine Ratio: 0.14 mg/mg{Cre} (ref 0.00–0.15)
Total Protein, Urine: 66 mg/dL

## 2020-08-24 LAB — RESP PANEL BY RT-PCR (FLU A&B, COVID) ARPGX2
Influenza A by PCR: NEGATIVE
Influenza B by PCR: NEGATIVE
SARS Coronavirus 2 by RT PCR: NEGATIVE

## 2020-08-24 LAB — COMPREHENSIVE METABOLIC PANEL
ALT: 32 U/L (ref 0–44)
AST: 29 U/L (ref 15–41)
Albumin: 2.7 g/dL — ABNORMAL LOW (ref 3.5–5.0)
Alkaline Phosphatase: 203 U/L — ABNORMAL HIGH (ref 38–126)
Anion gap: 11 (ref 5–15)
BUN: 7 mg/dL (ref 6–20)
CO2: 20 mmol/L — ABNORMAL LOW (ref 22–32)
Calcium: 8.8 mg/dL — ABNORMAL LOW (ref 8.9–10.3)
Chloride: 104 mmol/L (ref 98–111)
Creatinine, Ser: 0.8 mg/dL (ref 0.44–1.00)
GFR, Estimated: >60 mL/min (ref >60)
Glucose, Bld: 127 mg/dL — ABNORMAL HIGH (ref 70–99)
Potassium: 4.2 mmol/L (ref 3.5–5.1)
Sodium: 135 mmol/L (ref 135–145)
Total Bilirubin: 1.2 mg/dL (ref 0.3–1.2)
Total Protein: 6.5 g/dL (ref 6.5–8.1)

## 2020-08-24 LAB — ABO/RH: ABO/RH(D): O POS

## 2020-08-24 LAB — D-DIMER, QUANTITATIVE: D-Dimer, Quant: 1.8 ug/mL-FEU — ABNORMAL HIGH (ref 0.00–0.50)

## 2020-08-24 LAB — BRAIN NATRIURETIC PEPTIDE: B Natriuretic Peptide: 241 pg/mL — ABNORMAL HIGH (ref 0.0–100.0)

## 2020-08-24 MED ORDER — CALCIUM CARBONATE ANTACID 500 MG PO CHEW
2.0000 | CHEWABLE_TABLET | ORAL | Status: DC | PRN
  Filled 2020-08-24: qty 2

## 2020-08-24 MED ORDER — INSULIN NPH (HUMAN) (ISOPHANE) 100 UNIT/ML ~~LOC~~ SUSP
32.0000 [IU] | Freq: Every day | SUBCUTANEOUS | Status: DC
  Administered 2020-08-24: 32 [IU] via SUBCUTANEOUS
  Filled 2020-08-24: qty 10

## 2020-08-24 MED ORDER — LABETALOL HCL 200 MG PO TABS
200.0000 mg | ORAL_TABLET | Freq: Two times a day (BID) | ORAL | Status: DC
  Administered 2020-08-24: 200 mg via ORAL
  Filled 2020-08-24: qty 1

## 2020-08-24 MED ORDER — BUDESONIDE 0.25 MG/2ML IN SUSP
0.2500 mg | Freq: Two times a day (BID) | RESPIRATORY_TRACT | Status: DC
  Administered 2020-08-24 – 2020-09-01 (×13): 0.25 mg via RESPIRATORY_TRACT
  Filled 2020-08-24 (×18): qty 2

## 2020-08-24 MED ORDER — FUROSEMIDE 40 MG PO TABS: 20.0000 mg | ORAL_TABLET | Freq: Two times a day (BID) | ORAL | Status: DC

## 2020-08-24 MED ORDER — BETAMETHASONE SOD PHOS & ACET 6 (3-3) MG/ML IJ SUSP
12.0000 mg | INTRAMUSCULAR | Status: AC
  Administered 2020-08-24 – 2020-08-25 (×2): 12 mg via INTRAMUSCULAR
  Filled 2020-08-24: qty 5

## 2020-08-24 MED ORDER — VALACYCLOVIR HCL 500 MG PO TABS: 1000.0000 mg | ORAL_TABLET | Freq: Every day | ORAL | Status: DC

## 2020-08-24 MED ORDER — FUROSEMIDE 10 MG/ML IJ SOLN
40.0000 mg | Freq: Two times a day (BID) | INTRAMUSCULAR | Status: DC
  Administered 2020-08-24 – 2020-08-27 (×6): 40 mg via INTRAVENOUS
  Filled 2020-08-24 (×6): qty 4

## 2020-08-24 MED ORDER — VALACYCLOVIR HCL 500 MG PO TABS
1000.0000 mg | ORAL_TABLET | Freq: Every day | ORAL | Status: DC
  Administered 2020-08-24 – 2020-08-27 (×4): 1000 mg via ORAL
  Filled 2020-08-24 (×6): qty 2

## 2020-08-24 MED ORDER — HEPARIN SODIUM (PORCINE) 10000 UNIT/ML IJ SOLN
10000.0000 [IU] | Freq: Once | INTRAMUSCULAR | Status: AC
  Administered 2020-08-25: 10000 [IU] via SUBCUTANEOUS
  Filled 2020-08-24 (×2): qty 1

## 2020-08-24 MED ORDER — ASPIRIN 81 MG PO CHEW
81.0000 mg | CHEWABLE_TABLET | Freq: Every day | ORAL | Status: DC
  Administered 2020-08-24 – 2020-08-31 (×8): 81 mg via ORAL
  Filled 2020-08-24 (×8): qty 1

## 2020-08-24 MED ORDER — ACETAMINOPHEN 325 MG PO TABS
650.0000 mg | ORAL_TABLET | ORAL | Status: DC | PRN
  Administered 2020-08-28: 650 mg via ORAL
  Filled 2020-08-24: qty 2

## 2020-08-24 MED ORDER — INSULIN ASPART 100 UNIT/ML ~~LOC~~ SOLN: 0.0000 [IU] | Freq: Three times a day (TID) | SUBCUTANEOUS | Status: DC

## 2020-08-24 MED ORDER — DOCUSATE SODIUM 100 MG PO CAPS
100.0000 mg | ORAL_CAPSULE | Freq: Every day | ORAL | Status: DC
  Administered 2020-08-25 – 2020-08-31 (×6): 100 mg via ORAL
  Filled 2020-08-24 (×6): qty 1

## 2020-08-24 MED ORDER — PRENATAL MULTIVITAMIN CH
1.0000 | ORAL_TABLET | Freq: Every day | ORAL | Status: DC
  Administered 2020-08-25 – 2020-09-01 (×7): 1 via ORAL
  Filled 2020-08-24 (×8): qty 1

## 2020-08-24 MED ORDER — ALBUTEROL SULFATE (2.5 MG/3ML) 0.083% IN NEBU
2.5000 mg | INHALATION_SOLUTION | Freq: Four times a day (QID) | RESPIRATORY_TRACT | Status: DC | PRN
  Administered 2020-08-25 – 2020-08-27 (×3): 2.5 mg via RESPIRATORY_TRACT
  Filled 2020-08-24 (×4): qty 3

## 2020-08-24 MED ORDER — ASPIRIN 81 MG PO CHEW: 81.0000 mg | CHEWABLE_TABLET | Freq: Every day | ORAL | Status: DC

## 2020-08-24 MED ORDER — INSULIN ASPART 100 UNIT/ML ~~LOC~~ SOLN
0.0000 [IU] | Freq: Three times a day (TID) | SUBCUTANEOUS | Status: DC
  Administered 2020-08-25 (×2): 2 [IU] via SUBCUTANEOUS

## 2020-08-24 MED ORDER — NIFEDIPINE ER OSMOTIC RELEASE 30 MG PO TB24: 30.0000 mg | ORAL_TABLET | Freq: Every day | ORAL | Status: DC

## 2020-08-24 MED ORDER — LABETALOL HCL 100 MG PO TABS
100.0000 mg | ORAL_TABLET | Freq: Three times a day (TID) | ORAL | Status: DC
  Administered 2020-08-24 – 2020-08-25 (×2): 100 mg via ORAL
  Filled 2020-08-24 (×2): qty 1

## 2020-08-24 MED ORDER — PERFLUTREN LIPID MICROSPHERE
1.0000 mL | INTRAVENOUS | Status: AC | PRN
  Administered 2020-08-24: 3 mL via INTRAVENOUS
  Filled 2020-08-24: qty 10

## 2020-08-24 MED ORDER — ZOLPIDEM TARTRATE 5 MG PO TABS
5.0000 mg | ORAL_TABLET | Freq: Every evening | ORAL | Status: DC | PRN
  Administered 2020-08-26 – 2020-08-31 (×6): 5 mg via ORAL
  Filled 2020-08-24 (×6): qty 1

## 2020-08-24 MED ORDER — LABETALOL HCL 5 MG/ML IV SOLN: 10.0000 mg | INTRAVENOUS | Status: DC | PRN

## 2020-08-24 MED ORDER — NIFEDIPINE ER OSMOTIC RELEASE 30 MG PO TB24
30.0000 mg | ORAL_TABLET | Freq: Every day | ORAL | Status: DC
  Administered 2020-08-24: 30 mg via ORAL
  Filled 2020-08-24: qty 1

## 2020-08-24 MED ORDER — IOHEXOL 350 MG/ML SOLN
75.0000 mL | Freq: Once | INTRAVENOUS | Status: AC | PRN
  Administered 2020-08-24: 75 mL via INTRAVENOUS

## 2020-08-24 NOTE — Consult Note (Signed)
[facility] Medical Consultation  [name] JWJ:[id number] DOB: Jun 17, 1981 DOA: 08/24/2020 PCP: Donato Schultz, DO   Requesting physician: Dr. Conni Elliot Date of consultation:08/23/2020 Reason for consultation: SOB  Impression/Recommendations Active Problems:   Shortness of breath in antepartum period    1. Acute on chronic diastolic CHF decompensation: Likely from uncontrolled hypertension.  Discussed with OB attending Dr. Rana Snare, suspect patient in preeclampsia, more stringent BP control indicated.  Currently, patient also has signs of fluid overload, breathing is compromised.  Reviewed literature along with pharmacist, and several information sources indicating that Lasix element has a risk of placenta blood flow reduction in pregnancy, and recommended decreased dosage and closely clinical monitoring of volume status and electrolytes, decided to use small dose of p.o. Lasix to minimize the impact of blood for reduction to placenta.  Will give 2 doses of Lasix 20 mg p.o. tonight and tomorrow morning and then reevaluate.  Also talked to echocardiogram office and who will perform a pregnancy echo this afternoon to help [place] to manage this new onset CHF.  For blood pressure control, I increased patient's labetalol dose also added as needed labetalol push for especially diastolic BP more than 90.  Hold off calcium channel blocker given worsening of leg swelling. 2. Preeclampsia, proteinuria, but no compromise of LFTs or mentation, titrate up for labetalol as above. 3. Asthma, continue albuterol PRN 4. OSA, CPAP HS. 5. Pre-diebetes, on metformin 6. Elevated D-dimer, probably related to CHF. Will check DVT study   I will followup again tomorrow. Please contact me if I can be of assistance in the meanwhile. Thank you for this consultation.  Chief Complaint: SOB  HPI:   [name] is a 39 y.o. female with medical history significant of " cardiomegaly", OSA on CPAP, asthma,  prediabetes, morbid obesity, presented with shortness of breath with 30+ week pregnancy.  Patient also has pregnancy associated hypertension and gestational diabetes.  Since diagnosed with gestational hypertension, patient has been on labetalol and nifedipine regimen.  She has been checking her blood pressure at home and keeps her record.  And record particular showed her diastolic BP almost always elevated ranging from 95-105. Her BP medication regimen has been remained the same throughout pregnancy.  Starting from about 2 weeks, patient started to feel gradually shortness of breath, initially was more exertional but for the last 2 days has been unable to lie flat.  Same time, she also noticed her legs has become more swollen.  She was evaluated yesterday for possible worsening of asthma and recommend use albuterol.  However despite using more albuterol nebulizing her shortness of breath persisted and she called OB yesterday who recommended she come in today.  Patient was also diagnosed with OSA last month, and she has been having some problem with her CPAP machine at home, thus unable to use it.  More than 5 years ago, patient had an echocardiogram and she was told she has cardiomegaly.  She also reported her father also has cardiomegaly and A. fib. Review of Systems:  Denies any chest pain, no cough, no fever chills, no joint problems no diarrhea.  Past Medical History:  Diagnosis Date  . Anemia   . Asthma   . Genital herpes   . Gestational diabetes   . Obesity   . Pre-diabetes   . Thyroid disease    Past Surgical History:  Procedure Laterality Date  . DILATATION & CURETTAGE/HYSTEROSCOPY WITH MYOSURE N/A 09/24/2018   Procedure: DILATATION & CURETTAGE/HYSTEROSCOPY WITH MYOSURE  POLYPECTOMY;  Surgeon: [name], MD;  Location: [facility];  Service: Gynecology;  Laterality: N/A;  . KELOID EXCISION  2008   removed @[facility] ----- 10/2011  . KELOID EXCISION  09/20/2018   behind right ear  .  SALIVARY GLAND SURGERY     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   Social History:  reports that she has never smoked. She has never used smokeless tobacco. She reports current alcohol use. She reports that she does not use drugs.  Allergies  Allergen Reactions  . Clomiphene Hives    Hives and lips swelling.   Family History  Problem Relation Age of Onset  . Hyperlipidemia Mother   . Hypertension Mother   . Depression Father   . Hyperlipidemia Father   . Heart disease Father 54       MI  . Hypertension Father   . Cancer Father 73       prostate  . Cancer Paternal Grandfather        prostate  . Coronary artery disease Other   . Prostate cancer Other   . Cancer Maternal Grandfather        prostate    Prior to Admission medications   Medication Sig Start Date End Date Taking? Authorizing Provider  albuterol (VENTOLIN HFA) 108 (90 Base) MCG/ACT inhaler Inhale 2 puffs into the lungs every 6 (six) hours as needed for wheezing or shortness of breath. 07/07/20  Yes Olalere, Adewale A, MD  aspirin 81 MG chewable tablet Chew 81 mg by mouth daily.   Yes [provider]  budesonide (PULMICORT FLEXHALER) 180 MCG/ACT inhaler Inhale 2 puffs into the lungs every 12 (twelve) hours. 07/07/20  Yes Olalere, Adewale A, MD  labetalol (NORMODYNE) 100 MG tablet Take 1 tablet by mouth every 8 (eight) hours. 05/28/20  Yes [provider]  LANTUS SOLOSTAR 100 UNIT/ML Solostar Pen Inject into the skin. 06/18/20  Yes [provider]  NIFEdipine (PROCARDIA-XL/NIFEDICAL-XL) 30 MG 24 hr tablet Take 30 mg by mouth daily.   Yes [provider]  nystatin (MYCOSTATIN) 100000 UNIT/ML suspension Take 5 mLs (500,000 Units total) by mouth 4 (four) times daily. Retain in mouth for as long as possible Use for 7 to 10 days 07/23/20  Yes Olalere, Adewale A, MD  Prenatal Vit-Fe Fumarate-FA (PRENATAL MULTIVITAMIN) TABS tablet Take 1 tablet by mouth daily at 12 noon.   Yes [provider]  valACYclovir (VALTREX) 1000 MG tablet Take 1 tablet (1,000 mg total) by mouth daily. 09/23/19  Yes 11/21/19, DO  Vitamin D, Ergocalciferol, (DRISDOL) 1.25 MG (50000 UT) CAPS capsule Take 1 capsule (50,000 Units total) by mouth every 7 (seven) days. 09/03/19  Yes 09/05/19, MD  albuterol (PROAIR HFA) 108 (90 Base) MCG/ACT inhaler Inhale 2 puffs into the lungs every 6 (six) hours as needed. 03/02/16   03/04/16, DO  ALPRAZolam (XANAX) 0.25 MG tablet Take 1 tablet (0.25 mg total) by mouth 3 (three) times daily as needed for sleep. Patient not taking: Reported on 08/19/2020 04/09/18   04/11/18 R, DO  clomiPHENE (CLOMID) 50 MG tablet Take two tabs po daily Day 3-7 of cycle. Patient not taking: Reported on 08/19/2020 09/04/19   09/06/19, MD  cyclobenzaprine (FLEXERIL) 10 MG tablet Take 1 tablet (10 mg total) by mouth 3 (three) times daily as needed for muscle spasms. Patient not taking: Reported on 08/19/2020 04/09/18   04/11/18, Zola Button, DO  ibuprofen (ADVIL,MOTRIN) 200 MG tablet  Take by mouth. Patient not taking: Reported on 08/19/2020    [provider]  letrozole (FEMARA) 2.5 MG tablet Take 2 tabs po daily Day 3-7 of cycle. Patient not taking: Reported on 08/19/2020 09/04/19   [name], MD  metFORMIN (GLUCOPHAGE-XR) 500 MG 24 hr tablet TAKE 1 TABLET (500 MG TOTAL) BY MOUTH DAILY WITH BREAKFAST. Patient not taking: Reported on 08/19/2020 10/17/19   [name], MD  nitrofurantoin (MACRODANTIN) 50 MG capsule Take with intercourse as needed Patient not taking: Reported on 08/19/2020 03/25/19   Harrington Challenger, NP  traMADol (ULTRAM) 50 MG tablet Take by mouth. Patient not taking: Reported on 08/19/2020 04/13/17   [provider]   Physical Exam: Blood pressure (!) 133/97, pulse (!) 125, temperature 97.8 F (36.6 C), temperature source Oral, resp. rate (!) 24, height 5\' 8"  (1.727 m), weight 133.2 kg, last  menstrual period 01/19/2020, SpO2 98 %. Vitals:   08/24/20 1500 08/24/20 1535  BP:    Pulse:    Resp:    Temp:    SpO2: 98% 98%     General: In respiratory distress  Eyes: Reactive to light and accommodation  ENT: Mucosa moist  Neck: Hard to evaluate JVD given neck size  Cardiovascular: Tachycardia, no murmur.  2+ bilateral pitting edema.  Respiratory: Fine crackles to bilateral mid field, no wheezing.  Abdomen: Compatible with pregnancy  Skin: No rash  Musculoskeletal: Normal ROM  Psychiatric: Calm  Neurologic: No focal deficit  Labs on Admission:  Basic Metabolic Panel: Recent Labs  Lab 08/24/20 1242  NA 135  K 4.2  CL 104  CO2 20*  GLUCOSE 127*  BUN 7  CREATININE 0.80  CALCIUM 8.8*   Liver Function Tests: Recent Labs  Lab 08/24/20 1242  AST 29  ALT 32  ALKPHOS 203*  BILITOT 1.2  PROT 6.5  ALBUMIN 2.7*   No results for input(s): LIPASE, AMYLASE in the last 168 hours. No results for input(s): AMMONIA in the last 168 hours. CBC: Recent Labs  Lab 08/24/20 1242  WBC 15.1*  HGB 11.4*  HCT 35.9*  MCV 74.9*  PLT 360   Cardiac Enzymes: No results for input(s): CKTOTAL, CKMB, CKMBINDEX, TROPONINI in the last 168 hours. BNP: Invalid input(s): POCBNP CBG: No results for input(s): GLUCAP in the last 168 hours.  Radiological Exams on Admission: DG CHEST PORT 1 VIEW  Result Date: 08/24/2020 CLINICAL DATA:  Shortness of breath. EXAM: PORTABLE CHEST 1 VIEW COMPARISON:  09/24/2008 FINDINGS: The heart is mildly enlarged despite the AP projection and portable technique. There is central vascular congestion and slight increased interstitial markings which could suggest mild edema. No pleural effusions or focal infiltrates. The bony thorax is intact. IMPRESSION: Cardiac enlargement with vascular congestion and possible mild interstitial edema. No definite infiltrates or effusions. Electronically Signed   By: 11/22/2008 M.D.   On: 08/24/2020 12:48     EKG: Independently reviewed.  Sinus tachycardia  Time spent: 45 minutes  14/03/2020 [facility] Pager [phone] 08/24/2020, 4:58 PM

## 2020-08-24 NOTE — Progress Notes (Signed)
Cardiology called. Echo showed LVEF 15-20% with severely dilated LV and global hypokinesis compatible with dilatory cardiomyopathy. Cardio attending Dr. Duke Salvia will come to see the patient this evening and help manage the new onset CHF.  DVT study negative.

## 2020-08-24 NOTE — Consult Note (Signed)
Cardiology Consultation:   Patient ID: [name] MRN: [id number]; DOB: 1981/05/31  Admit date: 08/24/2020 Date of Consult: 08/24/2020  Primary Care Provider: Zola Button, Grayling Congress, DO [facility] Cardiologist: New to [facility] Electrophysiologist:  None    Patient Profile:   [name] is a 39 y.o. female with a hx of 30 week pregnancy, remote diagnosis of "cardiomegaly" and prediabetes who is being seen today for the evaluation of increasing dyspnea for 2 month, orthopnea and PND with newly discovered cardiomyopathy at the request of Dr. Conni Elliot.  History of Present Illness:   [name] is a 39 year old female who is currently 30 weeks and 3 days pregnant.  She carries a previous diagnosis of cardiomegaly however no diagnosis of heart failure.  Previous echocardiogram obtained on 07/23/2019 showed EF 55 to 60%, mild MR. She mentions she has borderline hypertension and prediabetes prior to the pregnancy.  Her father has history of heart attack and A. fib at a young age.  Mother has both hypertension and diabetes.  She herself has never seen a heart doctor.  It is unclear to me when she had the diagnosis of "cardiomegaly".  This is her first pregnancy.  During this pregnancy, she had gestational hypertension. She also has prediabetes.  She was placed on Metformin, labetalol and nifedipine.  For the past 49-month, she has been having progressive dyspnea on exertion.  She recently was evaluated by pulmonology service and diagnosed with OSA. She got a CPAP machine 3 days ago, however has not used it.  For the past 3 days, she has worsening shortness of breath even at rest.  She also endorsed orthopnea and PND.  She denied any chest discomfort.  Patient eventually sought medical attention at [facility] will be special care.  Blood pressure based on home diary has been in the 130s with only one number reaching 150.  Heart rate has been tachycardic.  She has been sleeping  upright at home.  Urinalysis obtained on arrival showed many bacteria and 100 protein.  White blood cell count 15.1.  Hemoglobin 11.4.  Glucose 127.  Stable renal function.  Mildly elevated alkaline phosphatase at 203.  Although liver function is slightly increased when compared to the previous lab work, however still within normal range.  D-dimer was 1.8.  Covid test negative.  Chest x-ray showed cardiac enlargement with vascular congestion and possible mild interstitial edema, no definitive infiltrate or effusion.  CTA of the chest was negative for PE, it did reveal a small pericardial effusion, right greater than left pleural effusion, diffuse pulmonary groundglass opacity.  2D echo with Definity contrast showed EF 15 to 20%, grade 3 DD, moderate elevated pulmonary systolic pressure with RVSP 45 mmHg, severe LAE, moderate RAE, mild to moderate MR.  Cardiology service has been consulted for significant cardiomyopathy.    Past Medical History:  Diagnosis Date  . Anemia   . Asthma   . Genital herpes   . Gestational diabetes   . Obesity   . Pre-diabetes   . Thyroid disease     Past Surgical History:  Procedure Laterality Date  . DILATATION & CURETTAGE/HYSTEROSCOPY WITH MYOSURE N/A 09/24/2018   Procedure: DILATATION & CURETTAGE/HYSTEROSCOPY WITH MYOSURE POLYPECTOMY;  Surgeon: [name], MD;  Location: [facility];  Service: Gynecology;  Laterality: N/A;  . KELOID EXCISION  2008   removed ----- 10/2011  . KELOID EXCISION  09/20/2018   behind right ear  . 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 Authorizing Provider  albuterol (VENTOLIN HFA) 108 (90 Base) MCG/ACT inhaler Inhale 2 puffs into the lungs every 6 (six) hours as needed for wheezing or shortness of breath. 07/07/20  Yes Olalere, Adewale A, MD  aspirin 81 MG chewable tablet Chew 81 mg by mouth daily.    Yes [provider]  budesonide (PULMICORT FLEXHALER) 180 MCG/ACT inhaler Inhale 2 puffs into the lungs every 12 (twelve) hours. 07/07/20  Yes Olalere, Adewale A, MD  labetalol (NORMODYNE) 100 MG tablet Take 1 tablet by mouth every 8 (eight) hours. 05/28/20  Yes [provider]  LANTUS SOLOSTAR 100 UNIT/ML Solostar Pen Inject into the skin. 06/18/20  Yes [provider]  NIFEdipine (PROCARDIA-XL/NIFEDICAL-XL) 30 MG 24 hr tablet Take 30 mg by mouth daily.   Yes [provider]  nystatin (MYCOSTATIN) 100000 UNIT/ML suspension Take 5 mLs (500,000 Units total) by mouth 4 (four) times daily. Retain in mouth for as long as possible Use for 7 to 10 days 07/23/20  Yes Olalere, Adewale A, MD  Prenatal Vit-Fe Fumarate-FA (PRENATAL MULTIVITAMIN) TABS tablet Take 1 tablet by mouth daily at 12 noon.   Yes [provider]  valACYclovir (VALTREX) 1000 MG tablet Take 1 tablet (1,000 mg total) by mouth daily. 09/23/19  Yes Donato Schultz, DO  Vitamin D, Ergocalciferol, (DRISDOL) 1.25 MG (50000 UT) CAPS capsule Take 1 capsule (50,000 Units total) by mouth every 7 (seven) days. 09/03/19  Yes [name], MD  albuterol (PROAIR HFA) 108 (90 Base) MCG/ACT inhaler Inhale 2 puffs into the lungs every 6 (six) hours as needed. 03/02/16   Donato Schultz, DO  ALPRAZolam (XANAX) 0.25 MG tablet Take 1 tablet (0.25 mg total) by mouth 3 (three) times daily as needed for sleep. Patient not taking: Reported on 08/19/2020 04/09/18   Seabron Spates R, DO  clomiPHENE (CLOMID) 50 MG tablet Take two tabs po daily Day 3-7 of cycle. Patient not taking: Reported on 08/19/2020 09/04/19   [name], MD  cyclobenzaprine (FLEXERIL) 10 MG tablet Take 1 tablet (10 mg total) by mouth 3 (three) times daily as needed for muscle spasms. Patient not taking: Reported on 08/19/2020 04/09/18   Zola Button, Grayling Congress, DO  ibuprofen (ADVIL,MOTRIN) 200 MG tablet Take by mouth. Patient not  taking: Reported on 08/19/2020    [provider]  letrozole (FEMARA) 2.5 MG tablet Take 2 tabs po daily Day 3-7 of cycle. Patient not taking: Reported on 08/19/2020 09/04/19   [name], MD  metFORMIN (GLUCOPHAGE-XR) 500 MG 24 hr tablet TAKE 1 TABLET (500 MG TOTAL) BY MOUTH DAILY WITH BREAKFAST. Patient not taking: Reported on 08/19/2020 10/17/19   [name], MD  nitrofurantoin (MACRODANTIN) 50 MG capsule Take with intercourse as needed Patient not taking: Reported on 08/19/2020 03/25/19   Harrington Challenger, NP  traMADol (ULTRAM) 50 MG tablet Take by mouth. Patient not taking: Reported on 08/19/2020 04/13/17   [provider]    Inpatient Medications: Scheduled Meds: . aspirin  81 mg Oral Daily  . budesonide  0.25 mg Nebulization BID  . docusate sodium  100 mg Oral Daily  . furosemide  20 mg Oral BID  . insulin aspart  0-12 Units Subcutaneous TID PC  . insulin NPH Human  32 Units Subcutaneous QHS  . labetalol  100 mg Oral TID  . NIFEdipine  30 mg Oral Daily  . prenatal multivitamin  1 tablet Oral Q1200  . valACYclovir  1,000 mg Oral Daily   Continuous Infusions:  PRN Meds: acetaminophen, albuterol, calcium carbonate, perflutren lipid microspheres (DEFINITY) IV suspension, zolpidem  Allergies:    Allergies  Allergen Reactions  . Clomiphene Hives    Hives and lips swelling.    Social History:   Social History   Socioeconomic History  . Marital status: Single    Spouse name: Not on file  . Number of children: Not on file  . Years of education: Not on file  . Highest education level: Not on file  Occupational History  . 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 Smoking status: Never Smoker  . Smokeless tobacco: Never Used  Vaping Use  . Vaping Use: Never used  Substance and Sexual Activity  . Alcohol use: Yes    Comment: SOCIAL   . Drug use: No  . Sexual activity: Yes    Partners: Male    Birth  control/protection: None    Comment: 1st intercourse- 51, partners- 10, current partner- 4 yrs   Other Topics Concern  . Not on file  Social History Narrative  . Not on file   Social Determinants of Health   Financial Resource Strain:   . Difficulty of Paying Living Expenses: Not on file  Food Insecurity: No Food Insecurity  . Worried About Programme researcher, broadcasting/film/video in the Last Year: Never true  . Ran Out of Food in the Last Year: Never true  Transportation Needs:   . Lack of Transportation (Medical): Not on file  . Lack of Transportation (Non-Medical): Not on file  Physical Activity:   . Days of Exercise per Week: Not on file  . Minutes of Exercise per Session: Not on file  Stress:   . Feeling of Stress : Not on file  Social Connections:   . Frequency of Communication with Friends and Family: Not on file  . Frequency of Social Gatherings with Friends and Family: Not on file  . Attends Religious Services: Not on file  . Active Member of Clubs or Organizations: Not on file  . Attends Banker Meetings: Not on file  . Marital Status: Not on file  Intimate Partner Violence:   . Fear of Current or Ex-Partner: Not on file  . Emotionally Abused: Not on file  . Physically Abused: Not on file  . Sexually Abused: Not on file    Family History:    Family History  Problem Relation Age of Onset  . Hyperlipidemia Mother   . Hypertension Mother   . Depression Father   . Hyperlipidemia Father   . Heart disease Father 33       MI  . Hypertension Father   . Cancer Father 48       prostate  . Cancer Paternal Grandfather        prostate  . Coronary artery disease Other   . Prostate cancer Other   . Cancer Maternal Grandfather        prostate     ROS:  Please see the history of present illness.   All other ROS reviewed and negative.     Physical Exam/Data:   Vitals:   08/24/20 1441 08/24/20 1500 08/24/20 1535 08/24/20 1850  BP: (!) 133/97   122/71  Pulse: (!) 125    (!) 120  Resp:    (!) 22  Temp:  98.5 F (36.9 C)  TempSrc:    Oral  SpO2: 97% 98% 98% 96%  Weight:      Height:       No intake or output data in the 24 hours ending 08/24/20 1854 Last 3 Weights 08/24/2020 07/07/2020 08/28/2019  Weight (lbs) 293 lb 9.6 oz 285 lb 6.4 oz 251 lb  Weight (kg) 133.176 kg 129.457 kg 113.853 kg     Body mass index is 44.64 kg/m.  General:  Well nourished, well developed, in no acute distress HEENT: normal Lymph: no adenopathy Neck: no JVD Endocrine:  No thryomegaly Vascular: No carotid bruits; FA pulses 2+ bilaterally without bruits  Cardiac:  normal S1, S2; tachycardic; no murmur  Lungs: Largely clear to auscultation, mild diminished breath sound in bilateral bases. Abd: soft, nontender, no hepatomegaly  Ext: 1+ bilateral ankle edema Musculoskeletal:  No deformities, BUE and BLE strength normal and equal Skin: warm and dry  Neuro:  CNs 2-12 intact, no focal abnormalities noted Psych:  Normal affect   EKG:  The EKG was personally reviewed and demonstrates: Sinus tachycardia without significant ST-T wave changes Telemetry:  Telemetry was personally reviewed and demonstrates:  N/A (nurse hooking her up to telemetry during interview)  Relevant CV Studies:  Echo 08/24/2020 1. There is no left ventricular thrombus with Definity contrast. Left  ventricular ejection fraction, by estimation, is 15-20%. The left  ventricle has severely decreased function. The left ventricle demonstrates  global hypokinesis. The left ventricular  internal cavity size was moderately dilated. Left ventricular diastolic  parameters are consistent with Grade III diastolic dysfunction  (restrictive). Elevated left atrial pressure.  2. Right ventricular systolic function is moderately reduced. The right  ventricular size is normal. There is moderately elevated pulmonary artery  systolic pressure. The estimated right ventricular systolic pressure is  45.0 mmHg.  3. Left  atrial size was severely dilated.  4. Right atrial size was moderately dilated.  5. The pericardial effusion is circumferential.  6. The mitral valve is normal in structure. Mild to moderate mitral valve  regurgitation.  7. The aortic valve is normal in structure. Aortic valve regurgitation is  not visualized. No aortic stenosis is present.  8. The inferior vena cava is dilated in size with <50% respiratory  variability, suggesting right atrial pressure of 15 mmHg  Laboratory Data:  High Sensitivity Troponin:   Recent Labs  Lab 08/24/20 1242  TROPONINIHS 13     Chemistry Recent Labs  Lab 08/24/20 1242  NA 135  K 4.2  CL 104  CO2 20*  GLUCOSE 127*  BUN 7  CREATININE 0.80  CALCIUM 8.8*  GFRNONAA >60  ANIONGAP 11    Recent Labs  Lab 08/24/20 1242  PROT 6.5  ALBUMIN 2.7*  AST 29  ALT 32  ALKPHOS 203*  BILITOT 1.2   Hematology Recent Labs  Lab 08/24/20 1242  WBC 15.1*  RBC 4.79  HGB 11.4*  HCT 35.9*  MCV 74.9*  MCH 23.8*  MCHC 31.8  RDW 16.7*  PLT 360   BNP Recent Labs  Lab 08/24/20 1242  BNP 241.0*    DDimer  Recent Labs  Lab 08/24/20 1242  DDIMER 1.80*     Radiology/Studies:  CT Angio Chest PE W and/or Wo Contrast  Result Date: 08/24/2020 CLINICAL DATA:  39 year old female pregnant female in the 3rd trimester with progressive shortness of breath. EXAM: CT ANGIOGRAPHY CHEST WITH CONTRAST TECHNIQUE: Multidetector CT imaging of the chest was performed using the standard protocol during  bolus administration of intravenous contrast. Multiplanar CT image reconstructions and MIPs were obtained to evaluate the vascular anatomy. CONTRAST:  75mL OMNIPAQUE IOHEXOL 350 MG/ML SOLN COMPARISON:  Portable chest radiograph earlier today. FINDINGS: Cardiovascular: Good contrast bolus timing in the pulmonary arterial tree. Mild respiratory motion. No focal filling defect identified in the pulmonary arteries to suggest acute pulmonary embolism. Mild  cardiomegaly with small volume pericardial effusion and pericardial fluid tracking into the superior mediastinum. No contrast in the aorta on this exam. Mediastinum/Nodes: Small volume of pericardial effusion suspected tracking into the superior pericardial recesses. No mediastinal mass or lymphadenopathy is evident. Lungs/Pleura: Layering right greater than left pleural effusions with simple fluid density suggesting transudate (series 6, image 57). Up to moderate volume effusion on the right. Partially sub pulmonic effusion on the left. Major airways are patent. Confluent bilateral pulmonary ground-glass opacity with mosaic attenuation at the lung bases (series 8, image 78). There are some areas of more confluent peribronchial ground-glass opacity but no consolidation. Upper Abdomen: Trace contrast reflux into the hepatic IVC and hepatic veins. Negative visible spleen and stomach. Musculoskeletal: No acute osseous abnormality identified. Review of the MIP images confirms the above findings. IMPRESSION: 1. Negative for acute pulmonary embolus. 2. Constellation favoring cardiogenic pulmonary edema: - mild cardiomegaly with small pericardial effusion, - right greater than left layering pleural effusions, - diffuse pulmonary ground-glass opacity, perhaps with superimposed air trapping at the lung bases resulting in mosaic attenuation. A viral/atypical respiratory infection was considered but felt unlikely. Electronically Signed   By: Odessa Fleming M.D.   On: 08/24/2020 17:43   DG CHEST PORT 1 VIEW  Result Date: 08/24/2020 CLINICAL DATA:  Shortness of breath. EXAM: PORTABLE CHEST 1 VIEW COMPARISON:  09/24/2008 FINDINGS: The heart is mildly enlarged despite the AP projection and portable technique. There is central vascular congestion and slight increased interstitial markings which could suggest mild edema. No pleural effusions or focal infiltrates. The bony thorax is intact. IMPRESSION: Cardiac enlargement with vascular  congestion and possible mild interstitial edema. No definite infiltrates or effusions. Electronically Signed   By: Rudie Meyer M.D.   On: 08/24/2020 12:48   ECHOCARDIOGRAM COMPLETE  Result Date: 08/24/2020    ECHOCARDIOGRAM REPORT   Patient Name:   MAGDALENA SKILTON Date of Exam: 08/24/2020 Medical Rec #:  [id number]         Height:       68.0 in Accession #:    [id number]        Weight:       293.6 lb Date of Birth:  04-27-1981         BSA:          2.406 m Patient Age:    39 years          BP:           133/97 mmHg Patient Gender: F                 HR:           108 bpm. Exam Location:  Inpatient Procedure: 2D Echo, Cardiac Doppler, Color Doppler and Intracardiac            Opacification Agent Indications:    CHF-Acute diastolic  History:        Patient has prior history of Echocardiogram examinations, most                 recent 07/22/2009. CHF, Signs/Symptoms:Shortness of Breath; Risk  Factors:Sleep Apnea.  Sonographer:    Ross Ludwig RDCS (AE) Referring Phys: [id number] [facility] General  Sonographer Comments: Patient is morbidly obese. IMPRESSIONS  1. There is no left ventricular thrombus with Definity contrast. Left ventricular ejection fraction, by estimation, is 15-20%. The left ventricle has severely decreased function. The left ventricle demonstrates global hypokinesis. The left ventricular internal cavity size was moderately dilated. Left ventricular diastolic parameters are consistent with Grade III diastolic dysfunction (restrictive). Elevated left atrial pressure.  2. Right ventricular systolic function is moderately reduced. The right ventricular size is normal. There is moderately elevated pulmonary artery systolic pressure. The estimated right ventricular systolic pressure is 45.0 mmHg.  3. Left atrial size was severely dilated.  4. Right atrial size was moderately dilated.  5. The pericardial effusion is circumferential.  6. The mitral valve is normal in structure. Mild to moderate mitral  valve regurgitation.  7. The aortic valve is normal in structure. Aortic valve regurgitation is not visualized. No aortic stenosis is present.  8. The inferior vena cava is dilated in size with <50% respiratory variability, suggesting right atrial pressure of 15 mmHg. FINDINGS  Left Ventricle: There is no left ventricular thrombus with Definity contrast. Left ventricular ejection fraction, by estimation, is 15-20%. The left ventricle has severely decreased function. The left ventricle demonstrates global hypokinesis. Definity contrast agent was given IV to delineate the left ventricular endocardial borders. The left ventricular internal cavity size was moderately dilated. There is no left ventricular hypertrophy. Left ventricular diastolic parameters are consistent with Grade  III diastolic dysfunction (restrictive). Elevated left atrial pressure. Right Ventricle: The right ventricular size is normal. No increase in right ventricular wall thickness. Right ventricular systolic function is moderately reduced. There is moderately elevated pulmonary artery systolic pressure. The tricuspid regurgitant velocity is 2.74 m/s, and with an assumed right atrial pressure of 15 mmHg, the estimated right ventricular systolic pressure is 45.0 mmHg. Left Atrium: Left atrial size was severely dilated. Right Atrium: Right atrial size was moderately dilated. Pericardium: Trivial pericardial effusion is present. The pericardial effusion is circumferential. Mitral Valve: The mitral valve is normal in structure. Mild to moderate mitral valve regurgitation, with centrally-directed jet. Tricuspid Valve: The tricuspid valve is normal in structure. Tricuspid valve regurgitation is mild. Aortic Valve: The aortic valve is normal in structure. Aortic valve regurgitation is not visualized. No aortic stenosis is present. Aortic valve mean gradient measures 2.0 mmHg. Aortic valve peak gradient measures 2.8 mmHg. Aortic valve area, by VTI measures  1.46 cm. Pulmonic Valve: The pulmonic valve was grossly normal. Pulmonic valve regurgitation is trivial. Aorta: The aortic root and ascending aorta are structurally normal, with no evidence of dilitation. Venous: The inferior vena cava is dilated in size with less than 50% respiratory variability, suggesting right atrial pressure of 15 mmHg. IAS/Shunts: No atrial level shunt detected by color flow Doppler.  LEFT VENTRICLE PLAX 2D LVIDd:         6.18 cm LVIDs:         5.56 cm LV PW:         1.10 cm LV IVS:        1.10 cm LVOT diam:     1.80 cm LV SV:         18 LV SV Index:   7 LVOT Area:     2.54 cm  RIGHT VENTRICLE            IVC RV Basal diam:  3.40 cm    IVC diam:  2.70 cm RV S prime:     6.46 cm/s TAPSE (M-mode): 1.1 cm LEFT ATRIUM             Index       RIGHT ATRIUM           Index LA diam:        4.50 cm 1.87 cm/m  RA Area:     23.90 cm LA Vol (A2C):   91.6 ml 38.07 ml/m RA Volume:   72.40 ml  30.09 ml/m LA Vol (A4C):   97.6 ml 40.57 ml/m LA Biplane Vol: 99.4 ml 41.31 ml/m  AORTIC VALVE AV Area (Vmax):    1.53 cm AV Area (Vmean):   1.43 cm AV Area (VTI):     1.46 cm AV Vmax:           84.30 cm/s AV Vmean:          61.300 cm/s AV VTI:            0.122 m AV Peak Grad:      2.8 mmHg AV Mean Grad:      2.0 mmHg LVOT Vmax:         50.80 cm/s LVOT Vmean:        34.400 cm/s LVOT VTI:          0.070 m LVOT/AV VTI ratio: 0.57  AORTA Ao Root diam: 2.50 cm Ao Asc diam:  2.80 cm MR Peak grad:    72.6 mmHg   TRICUSPID VALVE MR Mean grad:    45.0 mmHg   TR Peak grad:   30.0 mmHg MR Vmax:         426.00 cm/s TR Vmax:        274.00 cm/s MR Vmean:        311.0 cm/s MR PISA:         1.01 cm    SHUNTS MR PISA Eff ROA: 9 mm       Systemic VTI:  0.07 m MR PISA Radius:  0.40 cm     Systemic Diam: 1.80 cm Rachelle Hora Croitoru MD Electronically signed by Thurmon Fair MD Signature Date/Time: 08/24/2020/6:09:06 PM    Final    VAS [place] LOWER EXTREMITY VENOUS (DVT)  Result Date: 08/24/2020  Lower Venous DVT Study Indications:  Swelling, and Edema.  Limitations: Poor ultrasound/tissue interface and body habitus. Comparison Study: no prior Performing Technologist: Blanch Media RVS  Examination Guidelines: A complete evaluation includes B-mode imaging, spectral Doppler, color Doppler, and power Doppler as needed of all accessible portions of each vessel. Bilateral testing is considered an integral part of a complete examination. Limited examinations for reoccurring indications may be performed as noted. The reflux portion of the exam is performed with the patient in reverse Trendelenburg.  +---------+---------------+---------+-----------+----------+-------------------+ RIGHT    CompressibilityPhasicitySpontaneityPropertiesThrombus Aging      +---------+---------------+---------+-----------+----------+-------------------+ CFV      Full           Yes      Yes                                      +---------+---------------+---------+-----------+----------+-------------------+ SFJ      Full                                                             +---------+---------------+---------+-----------+----------+-------------------+  FV Prox  Full                                                             +---------+---------------+---------+-----------+----------+-------------------+ FV Mid                  Yes      Yes                                      +---------+---------------+---------+-----------+----------+-------------------+ FV Distal               Yes      Yes                                      +---------+---------------+---------+-----------+----------+-------------------+ PFV      Full                                                             +---------+---------------+---------+-----------+----------+-------------------+ POP      Full           Yes      Yes                                      +---------+---------------+---------+-----------+----------+-------------------+  PTV      Full                                                             +---------+---------------+---------+-----------+----------+-------------------+ PERO                                                  Not well visualized +---------+---------------+---------+-----------+----------+-------------------+   +---------+---------------+---------+-----------+----------+--------------+ LEFT     CompressibilityPhasicitySpontaneityPropertiesThrombus Aging +---------+---------------+---------+-----------+----------+--------------+ CFV      Full           Yes      Yes                                 +---------+---------------+---------+-----------+----------+--------------+ SFJ      Full                                                        +---------+---------------+---------+-----------+----------+--------------+ FV Prox  Full                                                        +---------+---------------+---------+-----------+----------+--------------+  FV Mid                  Yes      Yes                                 +---------+---------------+---------+-----------+----------+--------------+ FV Distal               Yes      Yes                                 +---------+---------------+---------+-----------+----------+--------------+ PFV      Full                                                        +---------+---------------+---------+-----------+----------+--------------+ POP      Full           Yes      Yes                                 +---------+---------------+---------+-----------+----------+--------------+ PTV      Full                                                        +---------+---------------+---------+-----------+----------+--------------+ PERO     Full                                                        +---------+---------------+---------+-----------+----------+--------------+     Summary: RIGHT: - There  is no evidence of deep vein thrombosis in the lower extremity. However, portions of this examination were limited- see technologist comments above.  - No cystic structure found in the popliteal fossa.  LEFT: - There is no evidence of deep vein thrombosis in the lower extremity. However, portions of this examination were limited- see technologist comments above.  - No cystic structure found in the popliteal fossa.  *See table(s) above for measurements and observations.    Preliminary      Assessment and Plan:   1. Acute systolic heart failure/newly diagnosed cardiomyopathy: Unclear if pericardiomyopathy versus myocarditis.  Currently 30 weeks and 3 days pregnant.  EF 15 to 20% with grade 3 diastolic dysfunction.  Patient has been noticing increasing dyspnea for the past 2 months with orthopnea and PND for the past few days.  -Currently on labetalol for blood pressure.  Lasix for diuresis.  -Discussed with MD, plan to obtain heart failure service consult tomorrow for further assessment.  2. Possible preeclampsia: Mild proteinuria was 100 protein in urinalysis and hypertension diagnosed during pregnancy.  Placed on Procardia and labetalol.  Procardia would not be ideal given her LV dysfunction.  Home blood pressure diary suggest systolic blood pressure mostly in the 130s with only 1 blood pressure reading reaching 150.  Blood pressure has been in the 130s while in the  [facility].    3. Obstructive sleep apnea: Per patient, she got a CPAP machine 3 days ago, however has not been able to use it.         New York Heart Association (NYHA) Functional Class NYHA Class IV        For questions or updates, please contact [facility] Please consult [email or web address] for contact info under    Ramond Dial, [place]  08/24/2020 6:54 PM

## 2020-08-24 NOTE — Progress Notes (Signed)
  Echocardiogram 2D Echocardiogram has been performed.  [name] 08/24/2020, 5:51 PM

## 2020-08-24 NOTE — Progress Notes (Signed)
Lower extremity venous has been completed.   Preliminary results in CV Proc.   Blanch Media 08/24/2020 5:44 PM

## 2020-08-24 NOTE — H&P (Signed)
[name] is a 39 y.o. female G1P0000 [redacted]w[redacted]d presenting for SOB.   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 Patient has been complaining of worsening SOB since 22 weeks. She was first seen by Pulmonology (Dr. Virl Diamond with [facility]) at the end of October. At that time she was diagnosed with asthma exacerbation in pregnancy and treated with Pulmicort inhaler BID and albuterol inhaler PRN. Patient states this has not helped alleviate her breathing symptoms and has progressively gotten worse throughout the pregnancy with acute exacerbation over the past 2 days. She states she is now unable to catch her breath even at rest and sitting upright. She has been unable to sleep due to her breathing difficulties and is constantly needing to sit upright in the chair. Reports a productive cough and increased phlegm production if she tries to lay back. Denies cough with sitting upright and denies any cold or flu like symptoms including fever. Denies any associated CP or palpitations. Denies any increase in LE swelling. No recent immobilization or trips. Patient was seen in the [facility] office today with these complaints and was subsequently sent in to [facility] for further evaluation.  In terms of cHTN, patient had not been on antihypertensives prior to pregnancy but had always been borderline. Labetalol was initiated in the first trimester and is now up to labetalol 100 mg TID with Procardia 30XL QD with good control. She has been on ASA 81mg  daily for preE ppx. Baseline labs performed at start of [facility] WNL. Remote history of abnormal EKG by PCP followed by cardiology consult and echocardiogram that showed mild cardiomegaly but no treatment or follow up required. Patient had been on Metformin prior to pregnancy for pre-diabetes and infertility. Early 1hr GTT performed elevated in the 200's and subsequently started on insulin  management in the second trimester. Currently on regimen of Lantus 32U qHS well controlled.  Routine prenatal care at [facility] since 12w transfer from [facility], dated by FET. She did have normal PGD, however Quad screen was elevated risk followed by low risk Panorama. Normal Anatomy [facility] with normal fetal echo performed, however views limited by maternal obesity. She has been seen by MFM in this pregnancy with most recent US performed on 12/2 which showed AGA with EFW 1606g (43%), normal anatomy, and normal amniotic fluid volume.    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   OB History    Gravida  1   Para  0   Term  0   Preterm  0   AB  0   Living  0     SAB  0   TAB  0   Ectopic  0   Multiple  0   Live Births  0          Past Medical History:  Diagnosis Date  . Anemia   . Asthma   . Genital herpes   . Gestational diabetes   . Obesity   . Pre-diabetes   . Thyroid disease    Past Surgical History:  Procedure Laterality Date  . DILATATION & CURETTAGE/HYSTEROSCOPY WITH MYOSURE N/A 09/24/2018   Procedure: DILATATION & CURETTAGE/HYSTEROSCOPY WITH MYOSURE POLYPECTOMY;  Surgeon: 11/23/2018, MD;  Location: [facility];  Service: Gynecology;  Laterality: N/A;  . KELOID EXCISION  2008   removed @[facility] ----- 10/2011  . KELOID EXCISION  09/20/2018   behind right ear  . 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 Social History:  reports that she has never smoked. She has never used smokeless tobacco. She reports current alcohol use. She reports that she does not use  drugs.     Maternal Diabetes: Yes:  Diabetes Type:  Pre-pregnancy, Insulin/Medication controlled Genetic Screening: Abnormal:  Results: Elevated risk of Trisomy 21, Other:on Quad, followed by low risk Panorama  Maternal Ultrasounds/Referrals: Normal Fetal Ultrasounds or other Referrals:  Fetal echo- normal Maternal Substance Abuse:  No Significant Maternal Medications:  Meds include: Other: Lantus, Labetalol, Procardia, Pulmicort, Albuterol, Valtrex, ASA Significant Maternal Lab Results:  None Other Comments:  None  Review of Systems  Constitutional: Positive for fatigue.  Respiratory: Positive for shortness of breath.   Cardiovascular: Negative for chest pain, palpitations and leg swelling.  All other systems reviewed and are negative.  Per HPI Maternal Exam:  Abdomen: Patient reports no abdominal tenderness. Introitus: not evaluated.   Cervix: not evaluated.   Fetal Exam Fetal State Assessment: Category I - tracings are normal.     Physical Exam Vitals reviewed.  Constitutional:      General: She is not in acute distress.    Appearance: She is obese. She is not ill-appearing.  HENT:     Head: Normocephalic.  Cardiovascular:     Rate and Rhythm: Regular rhythm. Tachycardia present.  Pulmonary:     Effort: Tachypnea present. No respiratory distress.     Breath sounds: Normal breath sounds. No stridor. No wheezing, rhonchi or rales.  Musculoskeletal:        General: Normal range of motion.     Cervical back: Normal range of motion.  Skin:    General: Skin is warm and dry.  Neurological:     General: No focal deficit present.     Mental Status: She is alert and oriented to person, place, and time.  Psychiatric:        Mood and Affect: Mood normal.        Behavior: Behavior normal.       Blood pressure (!) 133/97, pulse (!) 125, temperature 97.8 F (36.6 C), temperature source Oral, resp. rate (!) 24, height 5\' 8"  (1.727 m), weight 133.2 kg, last menstrual period  01/19/2020, SpO2 98 %.  Prenatal labs: ABO, Rh:  --/--/O POS (12/07 1400) Antibody: NEG (12/07 1400) Rubella:  Immune RPR:   NR HBsAg:   NEG HIV:   NR GBS:   unknown  Assessment/Plan: 39Y G1P0 @ 30.3 pregnancy complicated by cHTN, IDDM, asthma, and obesity with worsening SOB admitted with persistent tachycardia and tachypnea, now with working diagnosis of heart failure.   Echo report pending, but did have recent phone conversation with cardiology. Patient ejection fraction of 15% with enlargement of the left ventricle. Cardiology bedside consult tonight, f/u recommendations.  Review of initial work up significant for elevated B-NP at 241 and CXR with mild cardiac enlargement with vascular congestion and possible mild interstitial edema. Negative flu and COVID screening.  Preeclampsia work up negative including urine spot protein ratio of 0.13. Review of BP's show mild range elevations (118-140/96-103) but no severe ranges and otherwise asymptomatic for preeclampsia.  CTA negative for PE  -Admission to [facility] speciality care unit -Continue current antihypertensive regimen with Labetalol 100mg  TID and Procardia 30XL QD  -Diabetic diet with FSG monitoring QID, continue Lantus 32U qHS -Continue asthma medications with Pulmicort BID and Albuterol PRN -SCD's while in bed, consider pharmacologic VTE ppx pending cardio consult given significant risk factors for VTE -Continue Valtrex 1g PO qHS HSV ppx -Daily PNV  -Fetal monitoring with NST TID. Recent growth WNL -Routine antepartum care  Iden Stripling A Giavonna Pflum 08/24/2020, 4:56 PM

## 2020-08-24 NOTE — [facility]U Note (Signed)
Pt sent from office for eval for hyptension, dyspnea and edema. Pt has CHTN.

## 2020-08-24 NOTE — Progress Notes (Addendum)
Patient states that she does not wear CPAP at night and does not wish to wear it at the hospital. RT will monitor patient as needed.

## 2020-08-24 NOTE — MAU Provider Note (Cosign Needed Addendum)
History     CSN: [id number]  Arrival date and time: 08/24/20 1113   First Provider Initiated Contact with Patient 08/24/20 1213      Chief Complaint  Patient presents with  . Shortness of Breath  . Leg Swelling  . Hypertension   [name] is a 39 y.o. G1P0000 at [redacted]w[redacted]d who presents to MAU for abnormal NST in office. Patient reports she was having an NST in the office, but reports they could not tell if the baby's heart rate was dropping or her's, so she was sent to MAU for further evaluation. Patient also has longstanding SOB and is scheduled to see a pulmonoligst. Patient reports the SOB started around 17mo ago, and reports she was told it was just pregnancy and there was nothing they could do to help. Patient reports she could not make it through reading aloud without getting short of breath and could not walk short distances without having to catch her breath. Patient reports for the past two days her symptoms have worsened and she cannot sleep because she cannot catch her breath, even in a lying position. Patient reports she has a CPAP machine at home, but is not able to use it because she does not have the appropriate attachments. Patient reports she had a sleep study done two weeks ago and was diagnosed with sleep apnea. Patient reports even at rest for the past 2-3 days she feels like she cannot breathe. Patient reports she has tried her albuterol inhaler and has not gotten any relief from its use. Patient also reports thick plegm in the back of her throat that makes it difficult for her to breathe at night. Patient denies chest pain.  Pt denies VB, LOF, ctx, decreased FM, vaginal discharge/odor/itching. Pt denies N/V, abdominal pain, constipation, diarrhea, or urinary problems. Pt denies fever, chills, fatigue, sweating or changes in appetite. Pt denies chest pain. Pt denies dizziness, HA, light-headedness, weakness.  Problems this pregnancy include: GDM on insulin, cHTN,  asthma, IVF, hx cardiomegaly, sleep apnea. Allergies? clomiphene Current medications/supplements? Labetalol 100mg  TID, Procardia 30mg  XL daily, albuterol, pulmicort, low dose ASA Prenatal care provider? [facility], next appt 09/02/2020   OB History    Gravida  1   Para  0   Term  0   Preterm  0   AB  0   Living  0     SAB  0   TAB  0   Ectopic  0   Multiple  0   Live Births  0           Past Medical History:  Diagnosis Date  . Anemia   . Asthma   . Genital herpes   . Gestational diabetes   . Obesity   . Pre-diabetes   . Thyroid disease     Past Surgical History:  Procedure Laterality Date  . DILATATION & CURETTAGE/HYSTEROSCOPY WITH MYOSURE N/A 09/24/2018   Procedure: DILATATION & CURETTAGE/HYSTEROSCOPY WITH MYOSURE POLYPECTOMY;  Surgeon: [name], MD;  Location: [facility];  Service: Gynecology;  Laterality: N/A;  . KELOID EXCISION  2008   removed @[facility] ----- 10/2011  . KELOID EXCISION  09/20/2018   behind right ear  . 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 Hyperlipidemia Mother   . Hypertension Mother   . Depression Father   . Hyperlipidemia Father   . Heart disease Father 52  MI  . Hypertension Father   . Cancer Father 79       prostate  . Cancer Paternal Grandfather        prostate  . Coronary artery disease Other   . Prostate cancer Other   . Cancer Maternal Grandfather        prostate    Social History   Tobacco Use  . Smoking status: Never Smoker  . Smokeless tobacco: Never Used  Vaping Use  . Vaping Use: Never used  Substance Use Topics  . Alcohol use: Yes    Comment: SOCIAL   . Drug use: No    Allergies:  Allergies  Allergen Reactions  . Clomiphene Hives    Hives and lips swelling.    Medications Prior to Admission  Medication Sig Dispense Refill Last Dose  . albuterol (VENTOLIN HFA) 108 (90 Base) MCG/ACT inhaler Inhale 2  puffs into the lungs every 6 (six) hours as needed for wheezing or shortness of breath. 8 g 6 08/23/2020 at Unknown time  . aspirin 81 MG chewable tablet Chew 81 mg by mouth daily.   08/23/2020 at Unknown time  . budesonide (PULMICORT FLEXHALER) 180 MCG/ACT inhaler Inhale 2 puffs into the lungs every 12 (twelve) hours. 1 each 3 08/23/2020 at Unknown time  . labetalol (NORMODYNE) 100 MG tablet Take 1 tablet by mouth every 8 (eight) hours.   08/24/2020 at Unknown time  . LANTUS SOLOSTAR 100 UNIT/ML Solostar Pen Inject into the skin.   08/23/2020 at Unknown time  . NIFEdipine (PROCARDIA-XL/NIFEDICAL-XL) 30 MG 24 hr tablet Take 30 mg by mouth daily.   08/23/2020 at Unknown time  . nystatin (MYCOSTATIN) 100000 UNIT/ML suspension Take 5 mLs (500,000 Units total) by mouth 4 (four) times daily. Retain in mouth for as long as possible Use for 7 to 10 days 60 mL 1 Past Week at Unknown time  . Prenatal Vit-Fe Fumarate-FA (PRENATAL MULTIVITAMIN) TABS tablet Take 1 tablet by mouth daily at 12 noon.   08/23/2020 at Unknown time  . valACYclovir (VALTREX) 1000 MG tablet Take 1 tablet (1,000 mg total) by mouth daily. 90 tablet 3 08/23/2020 at Unknown time  . Vitamin D, Ergocalciferol, (DRISDOL) 1.25 MG (50000 UT) CAPS capsule Take 1 capsule (50,000 Units total) by mouth every 7 (seven) days. 12 capsule 0 08/23/2020 at Unknown time  . albuterol (PROAIR HFA) 108 (90 Base) MCG/ACT inhaler Inhale 2 puffs into the lungs every 6 (six) hours as needed. 6.7 g 1   . ALPRAZolam (XANAX) 0.25 MG tablet Take 1 tablet (0.25 mg total) by mouth 3 (three) times daily as needed for sleep. (Patient not taking: Reported on 08/19/2020) 30 tablet 0   . clomiPHENE (CLOMID) 50 MG tablet Take two tabs po daily Day 3-7 of cycle. (Patient not taking: Reported on 08/19/2020) 10 tablet 0   . cyclobenzaprine (FLEXERIL) 10 MG tablet Take 1 tablet (10 mg total) by mouth 3 (three) times daily as needed for muscle spasms. (Patient not taking: Reported on  08/19/2020) 30 tablet 0   . ibuprofen (ADVIL,MOTRIN) 200 MG tablet Take by mouth. (Patient not taking: Reported on 08/19/2020)     . letrozole (FEMARA) 2.5 MG tablet Take 2 tabs po daily Day 3-7 of cycle. (Patient not taking: Reported on 08/19/2020) 10 tablet 0   . metFORMIN (GLUCOPHAGE-XR) 500 MG 24 hr tablet TAKE 1 TABLET (500 MG TOTAL) BY MOUTH DAILY WITH BREAKFAST. (Patient not taking: Reported on 08/19/2020) 90 tablet 1   . nitrofurantoin (MACRODANTIN)  50 MG capsule Take with intercourse as needed (Patient not taking: Reported on 08/19/2020) 30 capsule 1   . traMADol (ULTRAM) 50 MG tablet Take by mouth. (Patient not taking: Reported on 08/19/2020)       Review of Systems  Constitutional: Negative for chills, diaphoresis, fatigue and fever.  Eyes: Negative for visual disturbance.  Respiratory: Positive for shortness of breath.   Cardiovascular: Negative for chest pain.  Gastrointestinal: Negative for abdominal pain, constipation, diarrhea, nausea and vomiting.  Genitourinary: Negative for dysuria, flank pain, frequency, pelvic pain, urgency, vaginal bleeding and vaginal discharge.  Neurological: Negative for dizziness, weakness, light-headedness and headaches.   Physical Exam   Blood pressure 129/87, pulse (!) 118, resp. rate (!) 22, height 5\' 8"  (1.727 m), weight 133.2 kg, last menstrual period 01/19/2020, SpO2 97 %.  Patient Vitals for the past 24 hrs:  BP Temp src Pulse Resp SpO2 Height Weight  08/24/20 1325 - - - - 97 % - -  08/24/20 1316 129/87 - (!) 118 - - - -  08/24/20 1315 - - - - 96 % - -  08/24/20 1310 - - - - 96 % - -  08/24/20 1307 (!) 132/103 - (!) 114 - - - -  08/24/20 1305 - - - - 96 % - -  08/24/20 1300 - - - - 97 % - -  08/24/20 1255 - - - - 96 % - -  08/24/20 1250 - - - - 99 % - -  08/24/20 1248 (!) 124/92 - (!) 123 - - - -  08/24/20 1245 - - - - 97 % - -  08/24/20 1225 - - - - 98 % - -  08/24/20 1220 - - - - 97 % - -  08/24/20 1217 (!) 140/103 - (!) 121 - - - -   08/24/20 1215 - - - - 98 % - -  08/24/20 1210 - - - - 96 % - -  08/24/20 1205 - - - - 97 % - -  08/24/20 1200 - - - - 97 % - -  08/24/20 1156 (!) 118/96 - 73 - - - -  08/24/20 1155 - - - - 99 % - -  08/24/20 1152 (!) 119/96 Oral (!) 125 (!) 22 97 % 5\' 8"  (1.727 m) 133.2 kg  08/24/20 1150 - - - - 98 % - -   Physical Exam Vitals and nursing note reviewed.  Constitutional:      General: She is in acute distress.     Appearance: Normal appearance. She is not ill-appearing, toxic-appearing or diaphoretic.  HENT:     Head: Normocephalic and atraumatic.  Cardiovascular:     Rate and Rhythm: Tachycardia present.     Heart sounds: Normal heart sounds.  Pulmonary:     Effort: Tachypnea and respiratory distress present. No accessory muscle usage.     Breath sounds: No stridor. Decreased breath sounds present. No wheezing or rhonchi.  Skin:    General: Skin is warm and dry.  Neurological:     Mental Status: She is alert and oriented to person, place, and time.  Psychiatric:        Mood and Affect: Mood normal.        Behavior: Behavior normal.        Thought Content: Thought content normal.        Judgment: Judgment normal.    Results for orders placed or performed during the hospital encounter of 08/24/20 (from the past  24 hour(s))  CBC     Status: Abnormal   Collection Time: 08/24/20 12:42 PM  Result Value Ref Range   WBC 15.1 (H) 4.0 - 10.5 K/uL   RBC 4.79 3.87 - 5.11 MIL/uL   Hemoglobin 11.4 (L) 12.0 - 15.0 g/dL   HCT 57.0 (L) 36 - 46 %   MCV 74.9 (L) 80.0 - 100.0 fL   MCH 23.8 (L) 26.0 - 34.0 pg   MCHC 31.8 30.0 - 36.0 g/dL   RDW 17.7 (H) 93.9 - 03.0 %   Platelets 360 150 - 400 K/uL   nRBC 0.3 (H) 0.0 - 0.2 %  Comprehensive metabolic panel     Status: Abnormal   Collection Time: 08/24/20 12:42 PM  Result Value Ref Range   Sodium 135 135 - 145 mmol/L   Potassium 4.2 3.5 - 5.1 mmol/L   Chloride 104 98 - 111 mmol/L   CO2 20 (L) 22 - 32 mmol/L   Glucose, Bld 127 (H) 70 -  99 mg/dL   BUN 7 6 - 20 mg/dL   Creatinine, Ser 0.92 0.44 - 1.00 mg/dL   Calcium 8.8 (L) 8.9 - 10.3 mg/dL   Total Protein 6.5 6.5 - 8.1 g/dL   Albumin 2.7 (L) 3.5 - 5.0 g/dL   AST 29 15 - 41 U/L   ALT 32 0 - 44 U/L   Alkaline Phosphatase 203 (H) 38 - 126 U/L   Total Bilirubin 1.2 0.3 - 1.2 mg/dL   GFR, Estimated >33 >00 mL/min   Anion gap 11 5 - 15  Brain natriuretic peptide     Status: Abnormal   Collection Time: 08/24/20 12:42 PM  Result Value Ref Range   B Natriuretic Peptide 241.0 (H) 0.0 - 100.0 pg/mL  D-dimer, quantitative (not at [facility])     Status: Abnormal   Collection Time: 08/24/20 12:42 PM  Result Value Ref Range   D-Dimer, Quant 1.80 (H) 0.00 - 0.50 ug/mL-FEU  ABO/Rh     Status: None   Collection Time: 08/24/20 12:42 PM  Result Value Ref Range   ABO/RH(D) O POS    No rh immune globuloin      NOT A RH IMMUNE GLOBULIN CANDIDATE, PT RH POSITIVE Performed at [facility], [address] [place], [place] [address]   Protein / creatinine ratio, urine     Status: None   Collection Time: 08/24/20  1:00 PM  Result Value Ref Range   Creatinine, Urine 478.04 mg/dL   Total Protein, Urine 66 mg/dL   Protein Creatinine Ratio 0.14 0.00 - 0.15 mg/mg[Cre]  Urinalysis, Routine w reflex microscopic Nasopharyngeal Swab     Status: Abnormal   Collection Time: 08/24/20  1:00 PM  Result Value Ref Range   Color, Urine AMBER (A) YELLOW   APPearance CLOUDY (A) CLEAR   Specific Gravity, Urine 1.024 1.005 - 1.030   pH 5.0 5.0 - 8.0   Glucose, UA NEGATIVE NEGATIVE mg/dL   Hgb urine dipstick NEGATIVE NEGATIVE   Bilirubin Urine SMALL (A) NEGATIVE   Ketones, ur NEGATIVE NEGATIVE mg/dL   Protein, ur 333 (A) NEGATIVE mg/dL   Nitrite NEGATIVE NEGATIVE   Leukocytes,Ua TRACE (A) NEGATIVE   RBC / HPF 6-10 0 - 5 RBC/hpf   WBC, UA 21-50 0 - 5 WBC/hpf   Bacteria, UA MANY (A) NONE SEEN   Squamous Epithelial / LPF 11-20 0 - 5   Mucus PRESENT    Non Squamous Epithelial 0-5 (A) NONE SEEN  DG CHEST PORT 1 VIEW  Result Date: 08/24/2020 CLINICAL DATA:  Shortness of breath. EXAM: PORTABLE CHEST 1 VIEW COMPARISON:  09/24/2008 FINDINGS: The heart is mildly enlarged despite the AP projection and portable technique. There is central vascular congestion and slight increased interstitial markings which could suggest mild edema. No pleural effusions or focal infiltrates. The bony thorax is intact. IMPRESSION: Cardiac enlargement with vascular congestion and possible mild interstitial edema. No definite infiltrates or effusions. Electronically Signed   By: Rudie Meyer M.D.   On: 08/24/2020 12:48   [place] MFM OB DETAIL +14 WK  Result Date: 08/19/2020 ----------------------------------------------------------------------  OBSTETRICS REPORT                       (Signed Final 08/19/2020 02:45 pm) ---------------------------------------------------------------------- Patient Info  ID #:       [id number]                          D.O.B.:  [birth year] (39 yrs)  Name:       JIM PHILEMON Gartin               Visit Date: 08/19/2020 01:09 pm ---------------------------------------------------------------------- Performed By  Attending:        Noralee Space MD        Ref. Address:     [facility]                                                             OB/GYN &                                                             Infertility                                                             [address]                                                             [place], [place]  [address]  Performed By:     Birdena Crandall        Location:         [facility]                                 [facility]                                                             [facility]  Referred By:      Lendon Colonel MD ---------------------------------------------------------------------- Orders  #  Description                           Code        Ordered By  1  [place] MFM OB DETAIL +14 WK               76811.01    Noland Fordyce ----------------------------------------------------------------------  #  Order #                     Accession #                Episode #  1  [id number]                   [id number]                 [id number] ---------------------------------------------------------------------- Indications  [redacted] weeks gestation of pregnancy                Z3A.30  Encounter for antenatal screening for          Z36.3  malformations  Pregnancy resulting from assisted              O09.819  reproductive technology  Gestational diabetes in pregnancy, (on         O24.419  insulin)  Obesity complicating pregnancy, third          O99.212  trimester (BMI:40-45)  Asthma                                         O99.89 j64.909  Advanced maternal age primigravida 17+,        O25.512  third trimester  Hypertension - Chronic/Pre-existing (on        O10.019  Labetolol)  NIPS:LOw risk, AFP: Neg ---------------------------------------------------------------------- Fetal Evaluation  Num Of Fetuses:         1  Fetal Heart Rate(bpm):  169  Cardiac Activity:  Observed  Presentation:           Breech  Placenta:               Anterior  P. Cord Insertion:      Not well visualized  Amniotic Fluid  AFI FV:      Within normal limits  AFI Sum(cm)     %Tile       Largest Pocket(cm)  19.1            73          6.7  RUQ(cm)       RLQ(cm)       LUQ(cm)        LLQ(cm)  2.8           6.4           6.7            3.2 ---------------------------------------------------------------------- Biometry  BPD:      75.6  mm     G. Age:  30w 2d         35  %    CI:        73.35   %    70 - 86                                                          FL/HC:      19.3   %    19.2 - 21.4  HC:      280.5  mm     G. Age:  30w 5d         22  %     HC/AC:      1.01        0.99 - 1.21  AC:      277.8  mm     G. Age:  31w 6d         84  %    FL/BPD:     71.4   %    71 - 87  FL:         54  mm     G. Age:  28w 4d        3.7  %    FL/AC:      19.4   %    20 - 24  Est. FW:    1606  gm      3 lb 9 oz     43  % ---------------------------------------------------------------------- OB History  Gravidity:    1 ---------------------------------------------------------------------- Gestational Age  LMP:           30w 3d        Date:  01/19/20                 EDD:   10/25/20  U/S Today:     30w 3d                                        EDD:   10/25/20  Best:          30w 3d     Det. By:  LMP  (01/19/20)          EDD:  10/25/20 ---------------------------------------------------------------------- Anatomy  Cranium:               Appears normal         Aortic Arch:            Appears normal  Cavum:                 Appears normal         Ductal Arch:            Not well visualized  Ventricles:            Appears normal         Diaphragm:              Appears normal  Choroid Plexus:        Appears normal         Stomach:                Appears normal, left                                                                        sided  Cerebellum:            Appears normal         Abdomen:                Appears normal  Posterior Fossa:       Appears normal         Abdominal Wall:         Appears nml (cord                                                                        insert, abd wall)  Nuchal Fold:           Not applicable (>20    Cord Vessels:           Appears normal ([redacted]                         wks GA)                                        vessel cord)  Face:                  Not well visualized    Kidneys:                Appear normal  Lips:                  Not well visualized    Bladder:                Appears normal  Thoracic:              Appears normal         Spine:  Ltd views no                                                                         intracranial signs of                                                                        NTD  Heart:                 Appears normal         Upper Extremities:      Visualized                         (4CH, axis, and                         situs)  RVOT:                  Not well visualized    Lower Extremities:      Visualized  LVOT:                  Not well visualized  Other:  Technicallly difficult due to advanced GA and maternal habitus. ---------------------------------------------------------------------- Impression  Patient is here for second opinion ultrasound.  She had fetal  echocardiography that was reported as normal.  However,  aortic arch could not be visualized.  Patient has gestational diabetes and takes insulin.  She also  has chronic hypertension and takes labetalol.  Blood  pressures today at her office were 149/101 and 150/86  mmHg.  On ultrasound, amniotic fluid is normal good fetal activity  seen.  Fetal biometry is consistent with the previously  established dates.  Fetal anatomical survey appears normal  but extremely limited because of advanced gestational age  and maternal obesity.  Aortic arch appears normal.  Maternal obesity imposes limitations on the resolution of  images, and failure to detect fetal anomalies is more common  in obese pregnant women. As maternal obesity makes  clinical assessment of fetal growth difficult, we recommend  serial growth scans until delivery.  I discussed weekly antenatal testing because of gestational  diabetes and hypertension. ---------------------------------------------------------------------- Recommendations  -Weekly BPP till delivery that may be performed at your office. ----------------------------------------------------------------------                  Noralee Space, MD Electronically Signed Final Report   08/19/2020 02:45 pm ----------------------------------------------------------------------  MAU Course  Procedures  MDM -SOB  without chest pain -elevated BP -COVID + PreE work-up performed -Dr. Juliene Pina in MAU Provider Office at time of Chest X-ray results, reviewed with Dr. Juliene Pina who recommends admission for patient while labs pending -EFM: reactive, but difficult to trace       -baseline: 150       -variability: moderate       -accels: present, 15x15       -decels: absent       -  TOCO: quiet, but difficult to trace -called and spoke with Dr. Conni Elliot who agree with plan for admission, relayed information about x-ray, EFM, BNP and other available lab results at the time of the phone call -admit to [facility]  Orders Placed This Encounter  Procedures  . Resp Panel by RT-PCR (Flu A&B, Covid) Nasopharyngeal Swab    Standing Status:   Standing    Number of Occurrences:   1    Order Specific Question:   Is this test for diagnosis or screening    Answer:   Diagnosis of ill patient    Order Specific Question:   Symptomatic for COVID-19 as defined by CDC    Answer:   Yes    Order Specific Question:   Date of Symptom Onset    Answer:   08/24/2020    Order Specific Question:   Hospitalized for COVID-19    Answer:   Yes    Order Specific Question:   Admitted to ICU for COVID-19    Answer:   No    Order Specific Question:   Previously tested for COVID-19    Answer:   Yes    Order Specific Question:   Resident in a congregate (group) care setting    Answer:   No    Order Specific Question:   Employed in healthcare setting    Answer:   No    Order Specific Question:   Pregnant    Answer:   Yes    Order Specific Question:   Has patient completed COVID vaccination(s) (2 doses of Pfizer/Moderna 1 dose of [employer])    Answer:   Yes  . DG CHEST PORT 1 VIEW    Standing Status:   Standing    Number of Occurrences:   1    Order Specific Question:   Symptom/Reason for Exam    Answer:   Shortness of breath [786.05.ICD-9-CM]  . CBC    Standing Status:   Standing    Number of Occurrences:   1  . Comprehensive  metabolic panel    Standing Status:   Standing    Number of Occurrences:   1  . Protein / creatinine ratio, urine    Standing Status:   Standing    Number of Occurrences:   1  . Brain natriuretic peptide    Standing Status:   Standing    Number of Occurrences:   1  . D-dimer, quantitative (not at [facility])    Standing Status:   Standing    Number of Occurrences:   1  . Urinalysis, Routine w reflex microscopic Urine, Clean Catch    Standing Status:   Standing    Number of Occurrences:   1  . Airborne and Contact precautions    Standing Status:   Standing    Number of Occurrences:   1  . EKG 12-Lead    Standing Status:   Standing    Number of Occurrences:   1    Order Specific Question:   Reason for Exam    Answer:   SOB  . ABO/Rh    Standing Status:   Standing    Number of Occurrences:   1   No orders of the defined types were placed in this encounter.   Assessment and Plan   1. Shortness of breath   2. [redacted] weeks gestation of pregnancy   3. Cardiomegaly   4. Pulmonary vascular congestion   5. Edema, unspecified  type    -admit to [facility]  Odie Sera Xaivier Malay 08/24/2020, 1:52 PM

## 2020-08-25 ENCOUNTER — Inpatient Hospital Stay (HOSPITAL_COMMUNITY): Payer: BC Managed Care – PPO

## 2020-08-25 ENCOUNTER — Inpatient Hospital Stay (HOSPITAL_BASED_OUTPATIENT_CLINIC_OR_DEPARTMENT_OTHER): Payer: BC Managed Care – PPO

## 2020-08-25 ENCOUNTER — Ambulatory Visit: Payer: BC Managed Care – PPO | Admitting: Primary Care

## 2020-08-25 DIAGNOSIS — O99413 Diseases of the circulatory system complicating pregnancy, third trimester: Secondary | ICD-10-CM | POA: Diagnosis not present

## 2020-08-25 DIAGNOSIS — I5041 Acute combined systolic (congestive) and diastolic (congestive) heart failure: Secondary | ICD-10-CM

## 2020-08-25 DIAGNOSIS — O99213 Obesity complicating pregnancy, third trimester: Secondary | ICD-10-CM

## 2020-08-25 DIAGNOSIS — Z3A31 31 weeks gestation of pregnancy: Secondary | ICD-10-CM

## 2020-08-25 DIAGNOSIS — I5020 Unspecified systolic (congestive) heart failure: Secondary | ICD-10-CM

## 2020-08-25 DIAGNOSIS — O09513 Supervision of elderly primigravida, third trimester: Secondary | ICD-10-CM

## 2020-08-25 DIAGNOSIS — Z3493 Encounter for supervision of normal pregnancy, unspecified, third trimester: Secondary | ICD-10-CM

## 2020-08-25 DIAGNOSIS — I110 Hypertensive heart disease with heart failure: Secondary | ICD-10-CM

## 2020-08-25 DIAGNOSIS — J45909 Unspecified asthma, uncomplicated: Secondary | ICD-10-CM

## 2020-08-25 DIAGNOSIS — O24414 Gestational diabetes mellitus in pregnancy, insulin controlled: Secondary | ICD-10-CM | POA: Diagnosis not present

## 2020-08-25 DIAGNOSIS — O10113 Pre-existing hypertensive heart disease complicating pregnancy, third trimester: Secondary | ICD-10-CM

## 2020-08-25 DIAGNOSIS — G4733 Obstructive sleep apnea (adult) (pediatric): Secondary | ICD-10-CM | POA: Diagnosis present

## 2020-08-25 DIAGNOSIS — O99513 Diseases of the respiratory system complicating pregnancy, third trimester: Secondary | ICD-10-CM

## 2020-08-25 DIAGNOSIS — E669 Obesity, unspecified: Secondary | ICD-10-CM

## 2020-08-25 DIAGNOSIS — Z3A30 30 weeks gestation of pregnancy: Secondary | ICD-10-CM

## 2020-08-25 DIAGNOSIS — O09813 Supervision of pregnancy resulting from assisted reproductive technology, third trimester: Secondary | ICD-10-CM

## 2020-08-25 LAB — COMPREHENSIVE METABOLIC PANEL
ALT: 33 U/L (ref 0–44)
AST: 27 U/L (ref 15–41)
Albumin: 2.7 g/dL — ABNORMAL LOW (ref 3.5–5.0)
Alkaline Phosphatase: 190 U/L — ABNORMAL HIGH (ref 38–126)
Anion gap: 10 (ref 5–15)
BUN: 10 mg/dL (ref 6–20)
CO2: 19 mmol/L — ABNORMAL LOW (ref 22–32)
Calcium: 9 mg/dL (ref 8.9–10.3)
Chloride: 105 mmol/L (ref 98–111)
Creatinine, Ser: 0.86 mg/dL (ref 0.44–1.00)
GFR, Estimated: >60 mL/min (ref >60)
Glucose, Bld: 107 mg/dL — ABNORMAL HIGH (ref 70–99)
Potassium: 4.2 mmol/L (ref 3.5–5.1)
Sodium: 134 mmol/L — ABNORMAL LOW (ref 135–145)
Total Bilirubin: 1.6 mg/dL — ABNORMAL HIGH (ref 0.3–1.2)
Total Protein: 6.7 g/dL (ref 6.5–8.1)

## 2020-08-25 LAB — CBC WITH DIFFERENTIAL/PLATELET
Abs Immature Granulocytes: 0.05 10*3/uL (ref 0.00–0.07)
Basophils Absolute: 0 10*3/uL (ref 0.0–0.1)
Basophils Relative: 0 %
Eosinophils Absolute: 0 10*3/uL (ref 0.0–0.5)
Eosinophils Relative: 0 %
HCT: 35.6 % — ABNORMAL LOW (ref 36.0–46.0)
Hemoglobin: 11 g/dL — ABNORMAL LOW (ref 12.0–15.0)
Immature Granulocytes: 0 %
Lymphocytes Relative: 8 %
Lymphs Abs: 1.1 10*3/uL (ref 0.7–4.0)
MCH: 23.3 pg — ABNORMAL LOW (ref 26.0–34.0)
MCHC: 30.9 g/dL (ref 30.0–36.0)
MCV: 75.4 fL — ABNORMAL LOW (ref 80.0–100.0)
Monocytes Absolute: 0.1 10*3/uL (ref 0.1–1.0)
Monocytes Relative: 1 %
Neutro Abs: 12.1 10*3/uL — ABNORMAL HIGH (ref 1.7–7.7)
Neutrophils Relative %: 91 %
Platelets: 336 10*3/uL (ref 150–400)
RBC: 4.72 MIL/uL (ref 3.87–5.11)
RDW: 16.6 % — ABNORMAL HIGH (ref 11.5–15.5)
WBC: 13.3 10*3/uL — ABNORMAL HIGH (ref 4.0–10.5)
nRBC: 0.2 % (ref 0.0–0.2)

## 2020-08-25 LAB — BASIC METABOLIC PANEL
Anion gap: 11 (ref 5–15)
BUN: 9 mg/dL (ref 6–20)
CO2: 22 mmol/L (ref 22–32)
Calcium: 8.8 mg/dL — ABNORMAL LOW (ref 8.9–10.3)
Chloride: 101 mmol/L (ref 98–111)
Creatinine, Ser: 0.92 mg/dL (ref 0.44–1.00)
GFR, Estimated: >60 mL/min (ref >60)
Glucose, Bld: 125 mg/dL — ABNORMAL HIGH (ref 70–99)
Potassium: 3.8 mmol/L (ref 3.5–5.1)
Sodium: 134 mmol/L — ABNORMAL LOW (ref 135–145)

## 2020-08-25 LAB — GLUCOSE, CAPILLARY
Glucose-Capillary: 118 mg/dL — ABNORMAL HIGH (ref 70–99)
Glucose-Capillary: 135 mg/dL — ABNORMAL HIGH (ref 70–99)
Glucose-Capillary: 115 mg/dL — ABNORMAL HIGH (ref 70–99)

## 2020-08-25 LAB — TSH: TSH: 1.986 u[IU]/mL (ref 0.350–4.500)

## 2020-08-25 LAB — MAGNESIUM: Magnesium: 1.5 mg/dL — ABNORMAL LOW (ref 1.7–2.4)

## 2020-08-25 LAB — PHOSPHORUS: Phosphorus: 3.9 mg/dL (ref 2.5–4.6)

## 2020-08-25 MED ORDER — MAGNESIUM SULFATE 4 GM/100ML IV SOLN
4.0000 g | Freq: Once | INTRAVENOUS | Status: AC
  Administered 2020-08-25: 4 g via INTRAVENOUS
  Filled 2020-08-25: qty 100

## 2020-08-25 MED ORDER — INSULIN GLARGINE 100 UNIT/ML ~~LOC~~ SOLN
32.0000 [IU] | Freq: Every day | SUBCUTANEOUS | Status: DC
  Administered 2020-08-25: 32 [IU] via SUBCUTANEOUS
  Filled 2020-08-25 (×2): qty 0.32

## 2020-08-25 MED ORDER — VITAMIN D (ERGOCALCIFEROL) 1.25 MG (50000 UNIT) PO CAPS
50000.0000 [IU] | ORAL_CAPSULE | ORAL | Status: DC
  Administered 2020-08-31: 50000 [IU] via ORAL
  Filled 2020-08-25: qty 1

## 2020-08-25 MED ORDER — HEPARIN SODIUM (PORCINE) 10000 UNIT/ML IJ SOLN
10000.0000 [IU] | Freq: Two times a day (BID) | INTRAMUSCULAR | Status: DC
  Administered 2020-08-25 – 2020-08-29 (×7): 10000 [IU] via SUBCUTANEOUS
  Filled 2020-08-25 (×10): qty 1

## 2020-08-25 MED ORDER — METOPROLOL TARTRATE 50 MG PO TABS
50.0000 mg | ORAL_TABLET | Freq: Two times a day (BID) | ORAL | Status: AC
  Administered 2020-08-25 (×2): 50 mg via ORAL
  Filled 2020-08-25 (×2): qty 1

## 2020-08-25 MED ORDER — HYDRALAZINE HCL 50 MG PO TABS
25.0000 mg | ORAL_TABLET | Freq: Three times a day (TID) | ORAL | Status: DC
  Administered 2020-08-25 – 2020-08-27 (×7): 25 mg via ORAL
  Filled 2020-08-25 (×8): qty 1

## 2020-08-25 MED ORDER — INSULIN ASPART 100 UNIT/ML ~~LOC~~ SOLN
0.0000 [IU] | Freq: Three times a day (TID) | SUBCUTANEOUS | Status: DC
  Administered 2020-08-26 – 2020-08-27 (×4): 2 [IU] via SUBCUTANEOUS
  Administered 2020-08-29: 3 [IU] via SUBCUTANEOUS

## 2020-08-25 MED ORDER — METOPROLOL SUCCINATE ER 100 MG PO TB24
100.0000 mg | ORAL_TABLET | Freq: Every day | ORAL | Status: DC
  Administered 2020-08-26 – 2020-08-27 (×2): 100 mg via ORAL
  Filled 2020-08-25 (×2): qty 1

## 2020-08-25 NOTE — Progress Notes (Addendum)
Progress Note  Patient Name: [name] Date of Encounter: 08/25/2020  [facility] Cardiologist: Chilton Si, MD (new)  Subjective   Feeling much better.  Breathing is improving.  She is able to ambulate across the room without getting short of breath.  She was also able to leave the head of her bed back without shortness of breath.  She decided that her weight is down 5 pounds.  Inpatient Medications    Scheduled Meds: . aspirin  81 mg Oral QHS  . betamethasone acetate-betamethasone sodium phosphate  12 mg Intramuscular Q24 Hr x 2  . budesonide  0.25 mg Nebulization BID  . docusate sodium  100 mg Oral Daily  . furosemide  40 mg Intravenous BID  . heparin injection (subcutaneous)  10,000 Units Subcutaneous Q12H  . insulin aspart  0-12 Units Subcutaneous TID PC  . insulin NPH Human  32 Units Subcutaneous QHS  . labetalol  100 mg Oral TID  . NIFEdipine  30 mg Oral QHS  . prenatal multivitamin  1 tablet Oral Q1200  . valACYclovir  1,000 mg Oral QHS  . [START ON 08/31/2020] Vitamin D (Ergocalciferol)  50,000 Units Oral Q7 days   Continuous Infusions:  PRN Meds: acetaminophen, albuterol, calcium carbonate, zolpidem   Vital Signs    Vitals:   08/25/20 0827 08/25/20 0958 08/25/20 1001 08/25/20 1136  BP: 130/73  124/76 117/78  Pulse: (!) 112  (!) 115 (!) 111  Resp: 18     Temp: 98 F (36.7 C)   98.1 F (36.7 C)  TempSrc: Oral   Oral  SpO2: 99% 100%  100%  Weight:      Height:        Intake/Output Summary (Last 24 hours) at 08/25/2020 1233 Last data filed at 08/25/2020 1139 Gross per 24 hour  Intake 600 ml  Output 2350 ml  Net -1750 ml   Last 3 Weights 08/25/2020 08/24/2020 07/07/2020  Weight (lbs) 289 lb 293 lb 9.6 oz 285 lb 6.4 oz  Weight (kg) 131.09 kg 133.176 kg 129.457 kg      Telemetry    N/a- Personally Reviewed  ECG    n/a - Personally Reviewed  Physical Exam   VS:  BP 117/78 (BP Location: Right Arm)   Pulse (!) 111   Temp 98.1 F  (36.7 C) (Oral)   Resp 18   Ht 5\' 8"  (1.727 m)   Wt 131.1 kg   LMP 01/19/2020   SpO2 100%   BMI 43.94 kg/m  , BMI Body mass index is 43.94 kg/m. GENERAL:  Well appearing.  No acute distress HEENT: Pupils equal round and reactive, fundi not visualized, oral mucosa unremarkable NECK:  + jugular venous distention, waveform within normal limits, carotid upstroke brisk and symmetric, no bruits LUNGS:  Clear to auscultation bilaterally HEART:  Tachycardic.  Regular rhythm.  PMI not displaced or sustained,S1 and S2 within normal limits, no S3, no S4, no clicks, no rubs, no murmurs ABD:  Gravid uterus. Positive bowel sounds normal in frequency in pitch, no bruits, no rebound, no guarding, no midline pulsatile mass, no hepatomegaly, no splenomegaly EXT:  2 plus pulses throughout, 2+ LE edema to the knees bilaterally. No cyanosis no clubbing SKIN:  No rashes no nodules NEURO:  Cranial nerves II through XII grossly intact, motor grossly intact throughout PSYCH:  Cognitively intact, oriented to person place and time   Labs    High Sensitivity Troponin:   Recent Labs  Lab 08/24/20 1242  TROPONINIHS 13      Chemistry Recent Labs  Lab 08/24/20 1242 08/25/20 0328  NA 135 134*  K 4.2 4.2  CL 104 105  CO2 20* 19*  GLUCOSE 127* 107*  BUN 7 10  CREATININE 0.80 0.86  CALCIUM 8.8* 9.0  PROT 6.5 6.7  ALBUMIN 2.7* 2.7*  AST 29 27  ALT 32 33  ALKPHOS 203* 190*  BILITOT 1.2 1.6*  GFRNONAA >60 >60  ANIONGAP 11 10     Hematology Recent Labs  Lab 08/24/20 1242 08/25/20 0328  WBC 15.1* 13.3*  RBC 4.79 4.72  HGB 11.4* 11.0*  HCT 35.9* 35.6*  MCV 74.9* 75.4*  MCH 23.8* 23.3*  MCHC 31.8 30.9  RDW 16.7* 16.6*  PLT 360 336    BNP Recent Labs  Lab 08/24/20 1242  BNP 241.0*     DDimer  Recent Labs  Lab 08/24/20 1242  DDIMER 1.80*     Radiology    DG Chest 1 View  Result Date: 08/25/2020 CLINICAL DATA:  CHF, pregnant, asthma EXAM: CHEST  1 VIEW COMPARISON:  Portable  exam 0655 hours compared to 08/24/2020 FINDINGS: Enlargement of cardiac silhouette with pulmonary vascular congestion. Mediastinal contours normal. Slightly improved interstitial infiltrate, likely improved edema. No pleural effusion or pneumothorax. IMPRESSION: Slightly improved probable pulmonary edema. Electronically Signed   By: Ulyses Southward M.D.   On: 08/25/2020 08:27   CT Angio Chest PE W and/or Wo Contrast  Result Date: 08/24/2020 CLINICAL DATA:  39 year old female pregnant female in the 3rd trimester with progressive shortness of breath. EXAM: CT ANGIOGRAPHY CHEST WITH CONTRAST TECHNIQUE: Multidetector CT imaging of the chest was performed using the standard protocol during bolus administration of intravenous contrast. Multiplanar CT image reconstructions and MIPs were obtained to evaluate the vascular anatomy. CONTRAST:  42mL OMNIPAQUE IOHEXOL 350 MG/ML SOLN COMPARISON:  Portable chest radiograph earlier today. FINDINGS: Cardiovascular: Good contrast bolus timing in the pulmonary arterial tree. Mild respiratory motion. No focal filling defect identified in the pulmonary arteries to suggest acute pulmonary embolism. Mild cardiomegaly with small volume pericardial effusion and pericardial fluid tracking into the superior mediastinum. No contrast in the aorta on this exam. Mediastinum/Nodes: Small volume of pericardial effusion suspected tracking into the superior pericardial recesses. No mediastinal mass or lymphadenopathy is evident. Lungs/Pleura: Layering right greater than left pleural effusions with simple fluid density suggesting transudate (series 6, image 57). Up to moderate volume effusion on the right. Partially sub pulmonic effusion on the left. Major airways are patent. Confluent bilateral pulmonary ground-glass opacity with mosaic attenuation at the lung bases (series 8, image 78). There are some areas of more confluent peribronchial ground-glass opacity but no consolidation. Upper Abdomen:  Trace contrast reflux into the hepatic IVC and hepatic veins. Negative visible spleen and stomach. Musculoskeletal: No acute osseous abnormality identified. Review of the MIP images confirms the above findings. IMPRESSION: 1. Negative for acute pulmonary embolus. 2. Constellation favoring cardiogenic pulmonary edema: - mild cardiomegaly with small pericardial effusion, - right greater than left layering pleural effusions, - diffuse pulmonary ground-glass opacity, perhaps with superimposed air trapping at the lung bases resulting in mosaic attenuation. A viral/atypical respiratory infection was considered but felt unlikely. Electronically Signed   By: Odessa Fleming M.D.   On: 08/24/2020 17:43   DG CHEST PORT 1 VIEW  Result Date: 08/24/2020 CLINICAL DATA:  Shortness of breath. EXAM: PORTABLE CHEST 1 VIEW COMPARISON:  09/24/2008 FINDINGS: The heart is mildly enlarged despite the AP projection and portable technique. There is  central vascular congestion and slight increased interstitial markings which could suggest mild edema. No pleural effusions or focal infiltrates. The bony thorax is intact. IMPRESSION: Cardiac enlargement with vascular congestion and possible mild interstitial edema. No definite infiltrates or effusions. Electronically Signed   By: Rudie Meyer M.D.   On: 08/24/2020 12:48   ECHOCARDIOGRAM COMPLETE  Result Date: 08/24/2020    ECHOCARDIOGRAM REPORT   Patient Name:   ANTANIYA VENUTI Date of Exam: 08/24/2020 Medical Rec #:  [id number]         Height:       68.0 in Accession #:    [id number]        Weight:       293.6 lb Date of Birth:  03/08/1981         BSA:          2.406 m Patient Age:    39 years          BP:           133/97 mmHg Patient Gender: F                 HR:           108 bpm. Exam Location:  Inpatient Procedure: 2D Echo, Cardiac Doppler, Color Doppler and Intracardiac            Opacification Agent Indications:    CHF-Acute diastolic  History:        Patient has prior history of  Echocardiogram examinations, most                 recent 07/22/2009. CHF, Signs/Symptoms:Shortness of Breath; Risk                 Factors:Sleep Apnea.  Sonographer:    Ross Ludwig RDCS (AE) Referring Phys: [id number] [place] General  Sonographer Comments: Patient is morbidly obese. IMPRESSIONS  1. There is no left ventricular thrombus with Definity contrast. Left ventricular ejection fraction, by estimation, is 15-20%. The left ventricle has severely decreased function. The left ventricle demonstrates global hypokinesis. The left ventricular internal cavity size was moderately dilated. Left ventricular diastolic parameters are consistent with Grade III diastolic dysfunction (restrictive). Elevated left atrial pressure.  2. Right ventricular systolic function is moderately reduced. The right ventricular size is normal. There is moderately elevated pulmonary artery systolic pressure. The estimated right ventricular systolic pressure is 45.0 mmHg.  3. Left atrial size was severely dilated.  4. Right atrial size was moderately dilated.  5. The pericardial effusion is circumferential.  6. The mitral valve is normal in structure. Mild to moderate mitral valve regurgitation.  7. The aortic valve is normal in structure. Aortic valve regurgitation is not visualized. No aortic stenosis is present.  8. The inferior vena cava is dilated in size with <50% respiratory variability, suggesting right atrial pressure of 15 mmHg. FINDINGS  Left Ventricle: There is no left ventricular thrombus with Definity contrast. Left ventricular ejection fraction, by estimation, is 15-20%. The left ventricle has severely decreased function. The left ventricle demonstrates global hypokinesis. Definity contrast agent was given IV to delineate the left ventricular endocardial borders. The left ventricular internal cavity size was moderately dilated. There is no left ventricular hypertrophy. Left ventricular diastolic parameters are consistent with Grade   III diastolic dysfunction (restrictive). Elevated left atrial pressure. Right Ventricle: The right ventricular size is normal. No increase in right ventricular wall thickness. Right ventricular systolic function is moderately reduced. There is moderately elevated pulmonary artery  systolic pressure. The tricuspid regurgitant velocity is 2.74 m/s, and with an assumed right atrial pressure of 15 mmHg, the estimated right ventricular systolic pressure is 45.0 mmHg. Left Atrium: Left atrial size was severely dilated. Right Atrium: Right atrial size was moderately dilated. Pericardium: Trivial pericardial effusion is present. The pericardial effusion is circumferential. Mitral Valve: The mitral valve is normal in structure. Mild to moderate mitral valve regurgitation, with centrally-directed jet. Tricuspid Valve: The tricuspid valve is normal in structure. Tricuspid valve regurgitation is mild. Aortic Valve: The aortic valve is normal in structure. Aortic valve regurgitation is not visualized. No aortic stenosis is present. Aortic valve mean gradient measures 2.0 mmHg. Aortic valve peak gradient measures 2.8 mmHg. Aortic valve area, by VTI measures 1.46 cm. Pulmonic Valve: The pulmonic valve was grossly normal. Pulmonic valve regurgitation is trivial. Aorta: The aortic root and ascending aorta are structurally normal, with no evidence of dilitation. Venous: The inferior vena cava is dilated in size with less than 50% respiratory variability, suggesting right atrial pressure of 15 mmHg. IAS/Shunts: No atrial level shunt detected by color flow Doppler.  LEFT VENTRICLE PLAX 2D LVIDd:         6.18 cm LVIDs:         5.56 cm LV PW:         1.10 cm LV IVS:        1.10 cm LVOT diam:     1.80 cm LV SV:         18 LV SV Index:   7 LVOT Area:     2.54 cm  RIGHT VENTRICLE            IVC RV Basal diam:  3.40 cm    IVC diam: 2.70 cm RV S prime:     6.46 cm/s TAPSE (M-mode): 1.1 cm LEFT ATRIUM             Index       RIGHT ATRIUM            Index LA diam:        4.50 cm 1.87 cm/m  RA Area:     23.90 cm LA Vol (A2C):   91.6 ml 38.07 ml/m RA Volume:   72.40 ml  30.09 ml/m LA Vol (A4C):   97.6 ml 40.57 ml/m LA Biplane Vol: 99.4 ml 41.31 ml/m  AORTIC VALVE AV Area (Vmax):    1.53 cm AV Area (Vmean):   1.43 cm AV Area (VTI):     1.46 cm AV Vmax:           84.30 cm/s AV Vmean:          61.300 cm/s AV VTI:            0.122 m AV Peak Grad:      2.8 mmHg AV Mean Grad:      2.0 mmHg LVOT Vmax:         50.80 cm/s LVOT Vmean:        34.400 cm/s LVOT VTI:          0.070 m LVOT/AV VTI ratio: 0.57  AORTA Ao Root diam: 2.50 cm Ao Asc diam:  2.80 cm MR Peak grad:    72.6 mmHg   TRICUSPID VALVE MR Mean grad:    45.0 mmHg   TR Peak grad:   30.0 mmHg MR Vmax:         426.00 cm/s TR Vmax:        274.00 cm/s MR Vmean:  311.0 cm/s MR PISA:         1.01 cm    SHUNTS MR PISA Eff ROA: 9 mm       Systemic VTI:  0.07 m MR PISA Radius:  0.40 cm     Systemic Diam: 1.80 cm Rachelle Hora Croitoru MD Electronically signed by Thurmon Fair MD Signature Date/Time: 08/24/2020/6:09:06 PM    Final    VAS [place] LOWER EXTREMITY VENOUS (DVT)  Result Date: 08/24/2020  Lower Venous DVT Study Indications: Swelling, and Edema.  Limitations: Poor ultrasound/tissue interface and body habitus. Comparison Study: no prior Performing Technologist: Blanch Media RVS  Examination Guidelines: A complete evaluation includes B-mode imaging, spectral Doppler, color Doppler, and power Doppler as needed of all accessible portions of each vessel. Bilateral testing is considered an integral part of a complete examination. Limited examinations for reoccurring indications may be performed as noted. The reflux portion of the exam is performed with the patient in reverse Trendelenburg.  +---------+---------------+---------+-----------+----------+-------------------+ RIGHT    CompressibilityPhasicitySpontaneityPropertiesThrombus Aging       +---------+---------------+---------+-----------+----------+-------------------+ CFV      Full           Yes      Yes                                      +---------+---------------+---------+-----------+----------+-------------------+ SFJ      Full                                                             +---------+---------------+---------+-----------+----------+-------------------+ FV Prox  Full                                                             +---------+---------------+---------+-----------+----------+-------------------+ FV Mid                  Yes      Yes                                      +---------+---------------+---------+-----------+----------+-------------------+ FV Distal               Yes      Yes                                      +---------+---------------+---------+-----------+----------+-------------------+ PFV      Full                                                             +---------+---------------+---------+-----------+----------+-------------------+ POP      Full           Yes      Yes                                      +---------+---------------+---------+-----------+----------+-------------------+  PTV      Full                                                             +---------+---------------+---------+-----------+----------+-------------------+ PERO                                                  Not well visualized +---------+---------------+---------+-----------+----------+-------------------+   +---------+---------------+---------+-----------+----------+--------------+ LEFT     CompressibilityPhasicitySpontaneityPropertiesThrombus Aging +---------+---------------+---------+-----------+----------+--------------+ CFV      Full           Yes      Yes                                 +---------+---------------+---------+-----------+----------+--------------+ SFJ      Full                                                         +---------+---------------+---------+-----------+----------+--------------+ FV Prox  Full                                                        +---------+---------------+---------+-----------+----------+--------------+ FV Mid                  Yes      Yes                                 +---------+---------------+---------+-----------+----------+--------------+ FV Distal               Yes      Yes                                 +---------+---------------+---------+-----------+----------+--------------+ PFV      Full                                                        +---------+---------------+---------+-----------+----------+--------------+ POP      Full           Yes      Yes                                 +---------+---------------+---------+-----------+----------+--------------+ PTV      Full                                                        +---------+---------------+---------+-----------+----------+--------------+  PERO     Full                                                        +---------+---------------+---------+-----------+----------+--------------+     Summary: RIGHT: - There is no evidence of deep vein thrombosis in the lower extremity. However, portions of this examination were limited- see technologist comments above.  - No cystic structure found in the popliteal fossa.  LEFT: - There is no evidence of deep vein thrombosis in the lower extremity. However, portions of this examination were limited- see technologist comments above.  - No cystic structure found in the popliteal fossa.  *See table(s) above for measurements and observations.    Preliminary     Cardiac Studies   Echo 08/24/20: 1. There is no left ventricular thrombus with Definity contrast. Left  ventricular ejection fraction, by estimation, is 15-20%. The left  ventricle has severely decreased function. The left ventricle  demonstrates  global hypokinesis. The left ventricular  internal cavity size was moderately dilated. Left ventricular diastolic  parameters are consistent with Grade III diastolic dysfunction  (restrictive). Elevated left atrial pressure.  2. Right ventricular systolic function is moderately reduced. The right  ventricular size is normal. There is moderately elevated pulmonary artery  systolic pressure. The estimated right ventricular systolic pressure is  45.0 mmHg.  3. Left atrial size was severely dilated.  4. Right atrial size was moderately dilated.  5. The pericardial effusion is circumferential.  6. The mitral valve is normal in structure. Mild to moderate mitral valve  regurgitation.  7. The aortic valve is normal in structure. Aortic valve regurgitation is  not visualized. No aortic stenosis is present.  8. The inferior vena cava is dilated in size with <50% respiratory  variability, suggesting right atrial pressure of 15 mmHg.   Patient Profile     Ms. Peckman is a 11F G1P0000 at 30 weeks with h/o ?cardiomegaly, diabetes, hypertension, asthma and obesity admitted with acute systolic and diastolic heart failure.   Assessment & Plan    # Acute systolic and diastolic heart failure:  LVEF 15 to 20% with dilated left ventricle.  She does have a history of cardiomegaly on chest x-ray but echo in 2010 did not reveal any abnormalities.  Given her family history of heart failure is unclear whether this may be genetic.  Recommend cardiac MRI at a later date.  For now continue with diuresis.  She was feeling better after receiving 2 doses of IV Lasix.  Weight 133.2 kg-->131.1 kg.  She is still quite volume overloaded.  Sodium is low today, though I suspect this is more due to hypervolemic hyponatremia.  Continue to watch closely.  I have asked the advanced heart failure team to weigh in just to make sure there is nothing else to add.  TSH is normal and OSA is being treated.  Plan is to  continue managing heart failure symptoms and ideally continue pregnancy at least 5 more weeks.  Our team will continue to follow her closely.  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  After delivery and lactation she will benefit from ARB/Entresto and will need a repeat echo after 3 months of guideline directed medical therapy.  # Essential hypertension:  Patient with hypertension that pre-dates pregnancy.  BP currently stable on labetalol.  However metoprolol be preferable for her heart failure and would likely better control her tachycardia.  Nifedipine is not ideal but options are limited given that she is pregnant.  For now, we will start metoprolol succinate 100 mg daily.  Stop labetalol.  Continue nifedipine and start hydralazine 25 mg every 8 hours.  Have discussed this with MFM and they are okay with starting this medication.      For questions or updates, please contact [facility] Please consult [email or web address] for contact info under        Signed, Chilton Si, MD  08/25/2020, 12:33 PM

## 2020-08-25 NOTE — Progress Notes (Signed)
Inpatient Diabetes Program Recommendations  AACE/ADA: New Consensus Statement on Inpatient Glycemic Control (2015)  Target Ranges:  Prepandial:   less than 140 mg/dL      Peak postprandial:   less than 180 mg/dL (1-2 hours)      Critically ill patients:  140 - 180 mg/dL   Lab Results  Component Value Date   GLUCAP 135 (H) 08/25/2020   HGBA1C 5.8 (H) 09/19/2018    Review of Glycemic Control Results for [name], [name] (MRN [id number]) as of 08/25/2020 09:16  Ref. Range 08/25/2020 06:11 08/25/2020 09:00  Glucose-Capillary Latest Ref Range: 70 - 99 mg/dL 585 (H) 929 (H)   Diabetes history: GDM Outpatient Diabetes medications: Lantus 32 units QHS Current orders for Inpatient glycemic control: NPH 32 units QHS, Novolog 0-12 units TID BMZ x1 (of 2)  Inpatient Diabetes Program Recommendations:    Noted consult. Anticipate glucose trends to be elevated above goal today given steroid administration.   Of note, NPH length of action is usually around 12 hours compared to Lantus which is 24 hours. Would recommend switching back to Lantus 32 units QHS.  Following.  Thanks, Lujean Rave, MSN, RNC-OB Diabetes Coordinator [phone] (8a-5p)

## 2020-08-25 NOTE — Consult Note (Addendum)
Advanced Heart Failure Team Consult Note   Primary Physician: Zola Button, Grayling Congress, DO PCP-Cardiologist:  Chilton Si, MD  Reason for Consultation: A/C Biventricular Heart Failure   HPI:    [name] is seen today for evaluation of  A/C Biventricular Heart Failure at the request of Dr Duke Salvia.   [name] is a 39 year old  history of cardiomegaly, DM, HTN, asthma, OSA, and obesity. Pregnancy complicated by HTN and DM. She has taken weight medications. Back in 2010 she saw Dr Eden Emms for HTN and cardiomegaly. Echo in 2010 with normal EF 55-60%. She has not been seen since that time.  She is [redacted] weeks pregnant with her 1st baby.   Over the past couple of months she developed shortness of breath and lower extremity edema. Systolic blood pressures at home have been in the 130s with an isolated reading > 150.  + orthopnea .  Admitted with lower extremity edema . On arrCXR with cardiomegaly with vascular congestion. ECHO was completed and showed severely reduced EF. Lower extremity doppler negative for DVT. CTA negative for PE.  Cardiology consulted. Started on IV lasix, metoprolol, hydralazine, and nifedipine.   Echo 08/24/20 Biventricular HF EF < 20%  Echo 2010 EF 55-60%   FH: Dad has A FIb, Aunt heart failure.  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   Review of Systems: [y] = yes, [ ]  = no   . General: Weight gain [ ] ; Weight loss [ ] ; Anorexia [ ] ; Fatigue [ ] ; Fever [ ] ; Chills [ ] ; Weakness [ ]   . Cardiac: Chest pain/pressure [ ] ; Resting SOB [ ] ; Exertional SOB [ ] ; Orthopnea [Y ]; Pedal Edema [ ] ; Palpitations [ ] ; Syncope [ ] ; Presyncope [ ] ; Paroxysmal nocturnal dyspnea[ ]   . Pulmonary: Cough [ ] ; Wheezing[ ] ; Hemoptysis[ ] ; Sputum [ ] ; Snoring [ ]   . GI: Vomiting[ ] ; Dysphagia[ ] ; Melena[ ] ; Hematochezia [ ] ; Heartburn[ ] ; Abdominal pain [ ] ; Constipation [ ] ; Diarrhea [ ] ; BRBPR [ ]   . GU: Hematuria[ ] ; Dysuria [ ] ; Nocturia[ ]    . Vascular: Pain in legs with walking [ ] ; Pain in feet with lying flat [ ] ; Non-healing sores [ ] ; Stroke [ ] ; TIA [ ] ; Slurred speech [ ] ;  . Neuro: Headaches[ ] ; Vertigo[ ] ; Seizures[ ] ; Paresthesias[ ] ;Blurred vision [ ] ; Diplopia [ ] ; Vision changes [ ]   . Ortho/Skin: Arthritis [ ] ; Joint pain [ ] ; Muscle pain [ ] ; Joint swelling [ ] ; Back Pain [Y ]; Rash [ ]   . Psych: Depression[ ] ; Anxiety[ ]   . Heme: Bleeding problems [ ] ; Clotting disorders [ ] ; Anemia [ ]   . Endocrine: Diabetes [ ] ; Thyroid dysfunction[ ]   Home Medications Prior to Admission medications   Medication Sig Start Date End Date Taking? Authorizing Provider  albuterol (VENTOLIN HFA) 108 (90 Base) MCG/ACT inhaler Inhale 2 puffs into the lungs every 6 (six) hours as needed for wheezing or shortness of breath. 07/07/20  Yes Olalere, Adewale A, MD  aspirin 81 MG chewable tablet Chew 81 mg by mouth daily.   Yes [provider]  budesonide (PULMICORT FLEXHALER) 180 MCG/ACT inhaler Inhale 2 puffs into the lungs every 12 (twelve) hours. 07/07/20  Yes Olalere, Adewale A, MD  calcium carbonate (TUMS - DOSED IN MG ELEMENTAL CALCIUM) 500 MG chewable tablet Chew 1-2 tablets by mouth daily as needed for indigestion or heartburn.   Yes [provider]  diphenhydramine-acetaminophen (TYLENOL PM) 25-500 MG TABS tablet Take 1 tablet by mouth at bedtime as needed (sleep).   Yes [provider]  ergocalciferol (VITAMIN D2) 1.25 MG (50000 UT) capsule Take 1,250 mcg by mouth daily. 09/15/19  Yes [provider]  labetalol (NORMODYNE) 100 MG tablet Take 1 tablet by mouth every 8 (eight) hours. 05/28/20  Yes [provider]  LANTUS SOLOSTAR 100 UNIT/ML Solostar Pen Inject 32 Units into the skin daily.  06/18/20  Yes [provider]  NIFEdipine (PROCARDIA-XL/NIFEDICAL-XL) 30 MG 24 hr tablet Take 30 mg by mouth daily.   Yes [provider]  nystatin (MYCOSTATIN) 100000 UNIT/ML suspension  Take 5 mLs (500,000 Units total) by mouth 4 (four) times daily. Retain in mouth for as long as possible Use for 7 to 10 days 07/23/20  Yes Olalere, Adewale A, MD  Prenatal Vit-Fe Fumarate-FA (PRENATAL MULTIVITAMIN) TABS tablet Take 1 tablet by mouth daily at 12 noon.   Yes [provider]  valACYclovir (VALTREX) 1000 MG tablet Take 1 tablet (1,000 mg total) by mouth daily. 09/23/19  Yes Seabron Spates R, DO  albuterol (PROAIR HFA) 108 (90 Base) MCG/ACT inhaler Inhale 2 puffs into the lungs every 6 (six) hours as needed. Patient not taking: Reported on 08/25/2020 03/02/16   Zola Button, Grayling Congress, DO  ALPRAZolam Prudy Feeler) 0.25 MG tablet Take 1 tablet (0.25 mg total) by mouth 3 (three) times daily as needed for sleep. Patient not taking: Reported on 08/19/2020 04/09/18   Seabron Spates R, DO  clomiPHENE (CLOMID) 50 MG tablet Take two tabs po daily Day 3-7 of cycle. Patient not taking: Reported on 08/19/2020 09/04/19   [name], MD  cyclobenzaprine (FLEXERIL) 10 MG tablet Take 1 tablet (10 mg total) by mouth 3 (three) times daily as needed for muscle spasms. Patient not taking: Reported on 08/19/2020 04/09/18   Zola Button, Grayling Congress, DO  ibuprofen (ADVIL,MOTRIN) 200 MG tablet Take by mouth. Patient not taking: Reported on 08/19/2020    [provider]  letrozole (FEMARA) 2.5 MG tablet Take 2 tabs po daily Day 3-7 of cycle. Patient not taking: Reported on 08/19/2020 09/04/19   [name], MD  metFORMIN (GLUCOPHAGE-XR) 500 MG 24 hr tablet TAKE 1 TABLET (500 MG TOTAL) BY MOUTH DAILY WITH BREAKFAST. Patient not taking: Reported on 08/19/2020 10/17/19   [name], MD  nitrofurantoin (MACRODANTIN) 50 MG capsule Take with intercourse as needed Patient not taking: Reported on 08/19/2020 03/25/19   Harrington Challenger, NP  traMADol (ULTRAM) 50 MG tablet Take by mouth. Patient not taking: Reported on 08/19/2020 04/13/17   [provider]  Vitamin D, Ergocalciferol,  (DRISDOL) 1.25 MG (50000 UT) CAPS capsule Take 1 capsule (50,000 Units total) by mouth every 7 (seven) days. Patient not taking: Reported on 08/25/2020 09/03/19   [name], MD    Past Medical History: Past Medical History:  Diagnosis Date  . Anemia   . Asthma   . Genital herpes   . Gestational diabetes   . Obesity   . Pre-diabetes   . Thyroid disease     Past Surgical History: Past Surgical History:  Procedure Laterality Date  . DILATATION & CURETTAGE/HYSTEROSCOPY WITH MYOSURE N/A 09/24/2018   Procedure: DILATATION & CURETTAGE/HYSTEROSCOPY WITH MYOSURE POLYPECTOMY;  Surgeon: [name], MD;  Location: [facility];  Service: Gynecology;  Laterality: N/A;  . KELOID EXCISION  2008   removed ----- 10/2011  . KELOID EXCISION  09/20/2018   behind right ear  .  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 Hyperlipidemia Mother   . Hypertension Mother   . Depression Father   . Hyperlipidemia Father   . Heart disease Father 57       MI  . Hypertension Father   . Cancer Father 5       prostate  . Cancer Paternal Grandfather        prostate  . Coronary artery disease Other   . Prostate cancer Other   . Cancer Maternal Grandfather        prostate    Social History: Social History   Socioeconomic History  . Marital status: Single    Spouse name: Not on file  . Number of children: Not on file  . Years of education: Not on file  . Highest education level: Not on file  Occupational History  . 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 Smoking status: Never Smoker  . Smokeless tobacco: Never Used  Vaping Use  . Vaping Use: Never used  Substance and Sexual Activity  . Alcohol use: Yes    Comment: SOCIAL   . Drug use: No  . Sexual activity: Yes    Partners: Male    Birth control/protection: None    Comment: 1st intercourse- 24,  partners- 10, current partner- 4 yrs   Other Topics Concern  . Not on file  Social History Narrative  . Not on file   Social Determinants of Health   Financial Resource Strain:   . Difficulty of Paying Living Expenses: Not on file  Food Insecurity: No Food Insecurity  . Worried About Programme researcher, broadcasting/film/video in the Last Year: Never true  . Ran Out of Food in the Last Year: Never true  Transportation Needs:   . Lack of Transportation (Medical): Not on file  . Lack of Transportation (Non-Medical): Not on file  Physical Activity:   . Days of Exercise per Week: Not on file  . Minutes of Exercise per Session: Not on file  Stress:   . Feeling of Stress : Not on file  Social Connections:   . Frequency of Communication with Friends and Family: Not on file  . Frequency of Social Gatherings with Friends and Family: Not on file  . Attends Religious Services: Not on file  . Active Member of Clubs or Organizations: Not on file  . Attends Banker Meetings: Not on file  . Marital Status: Not on file    Allergies:  Allergies  Allergen Reactions  . Clomiphene Hives    Hives and lips swelling.    Objective:    Vital Signs:   Temp:  [97.6 F (36.4 C)-98.5 F (36.9 C)] 98.1 F (36.7 C) (12/08 1136) Pulse Rate:  [107-125] 111 (12/08 1136) Resp:  [18-24] 18 (12/08 0827) BP: (117-143)/(71-99) 117/78 (12/08 1136) SpO2:  [90 %-100 %] 100 % (12/08 1136) Weight:  [131.1 kg] 131.1 kg (12/08 0611)    Weight change: Filed Weights   08/24/20 1152 08/25/20 0611  Weight: 133.2 kg 131.1 kg    Intake/Output:   Intake/Output Summary (Last 24 hours) at 08/25/2020 1431 Last data filed at 08/25/2020 1301 Gross per 24 hour  Intake 600 ml  Output 2700 ml  Net -2100 ml      Physical Exam    General:  . No resp difficulty HEENT:  normal Neck: supple. JVP difficult to assess Carotids 2+ bilat; no bruits. No lymphadenopathy or thyromegaly appreciated. Cor: PMI nondisplaced. Tachy  regular rate & rhythm. No rubs, gallops or murmurs. Lungs: clear Abdomen: soft, nontender, nondistended. No hepatosplenomegaly. No bruits or masses. Good bowel sounds. Extremities: no cyanosis, clubbing, rash, 3+ edema Neuro: alert & orientedx3, cranial nerves grossly intact. moves all 4 extremities w/o difficulty. Affect pleasant   Telemetry   Not on telemetry  EKG   Sinus Tach 126 on admit.   Labs   Basic Metabolic Panel: Recent Labs  Lab 08/24/20 1242 08/25/20 0328  NA 135 134*  K 4.2 4.2  CL 104 105  CO2 20* 19*  GLUCOSE 127* 107*  BUN 7 10  CREATININE 0.80 0.86  CALCIUM 8.8* 9.0  MG  --  1.5*  PHOS  --  3.9    Liver Function Tests: Recent Labs  Lab 08/24/20 1242 08/25/20 0328  AST 29 27  ALT 32 33  ALKPHOS 203* 190*  BILITOT 1.2 1.6*  PROT 6.5 6.7  ALBUMIN 2.7* 2.7*   No results for input(s): LIPASE, AMYLASE in the last 168 hours. No results for input(s): AMMONIA in the last 168 hours.  CBC: Recent Labs  Lab 08/24/20 1242 08/25/20 0328  WBC 15.1* 13.3*  NEUTROABS  --  12.1*  HGB 11.4* 11.0*  HCT 35.9* 35.6*  MCV 74.9* 75.4*  PLT 360 336    Cardiac Enzymes: No results for input(s): CKTOTAL, CKMB, CKMBINDEX, TROPONINI in the last 168 hours.  BNP: BNP (last 3 results) Recent Labs    08/24/20 1242  BNP 241.0*    ProBNP (last 3 results) No results for input(s): PROBNP in the last 8760 hours.   CBG: Recent Labs  Lab 08/25/20 0611 08/25/20 0900  GLUCAP 118* 135*    Coagulation Studies: No results for input(s): LABPROT, INR in the last 72 hours.   Imaging   DG Chest 1 View  Result Date: 08/25/2020 CLINICAL DATA:  CHF, pregnant, asthma EXAM: CHEST  1 VIEW COMPARISON:  Portable exam 0655 hours compared to 08/24/2020 FINDINGS: Enlargement of cardiac silhouette with pulmonary vascular congestion. Mediastinal contours normal. Slightly improved interstitial infiltrate, likely improved edema. No pleural effusion or pneumothorax.  IMPRESSION: Slightly improved probable pulmonary edema. Electronically Signed   By: Ulyses Southward M.D.   On: 08/25/2020 08:27   CT Angio Chest PE W and/or Wo Contrast  Result Date: 08/24/2020 CLINICAL DATA:  39 year old female pregnant female in the 3rd trimester with progressive shortness of breath. EXAM: CT ANGIOGRAPHY CHEST WITH CONTRAST TECHNIQUE: Multidetector CT imaging of the chest was performed using the standard protocol during bolus administration of intravenous contrast. Multiplanar CT image reconstructions and MIPs were obtained to evaluate the vascular anatomy. CONTRAST:  75mL OMNIPAQUE IOHEXOL 350 MG/ML SOLN COMPARISON:  Portable chest radiograph earlier today. FINDINGS: Cardiovascular: Good contrast bolus timing in the pulmonary arterial tree. Mild respiratory motion. No focal filling defect identified in the pulmonary arteries to suggest acute pulmonary embolism. Mild cardiomegaly with small volume pericardial effusion and pericardial fluid tracking into the superior mediastinum. No contrast in the aorta on this exam. Mediastinum/Nodes: Small volume of pericardial effusion suspected tracking into the superior pericardial recesses. No mediastinal mass or lymphadenopathy is evident. Lungs/Pleura: Layering right greater than left pleural effusions with simple fluid density suggesting transudate (series 6, image 57). Up to moderate volume effusion on the right. Partially sub pulmonic effusion on the left. Major airways are patent. Confluent bilateral pulmonary ground-glass opacity with  mosaic attenuation at the lung bases (series 8, image 78). There are some areas of more confluent peribronchial ground-glass opacity but no consolidation. Upper Abdomen: Trace contrast reflux into the hepatic IVC and hepatic veins. Negative visible spleen and stomach. Musculoskeletal: No acute osseous abnormality identified. Review of the MIP images confirms the above findings. IMPRESSION: 1. Negative for acute pulmonary  embolus. 2. Constellation favoring cardiogenic pulmonary edema: - mild cardiomegaly with small pericardial effusion, - right greater than left layering pleural effusions, - diffuse pulmonary ground-glass opacity, perhaps with superimposed air trapping at the lung bases resulting in mosaic attenuation. A viral/atypical respiratory infection was considered but felt unlikely. Electronically Signed   By: Odessa Fleming M.D.   On: 08/24/2020 17:43   ECHOCARDIOGRAM COMPLETE  Result Date: 08/24/2020    ECHOCARDIOGRAM REPORT   Patient Name:   ANNAHI Rios Date of Exam: 08/24/2020 Medical Rec #:  [id number]         Height:       68.0 in Accession #:    [id number]        Weight:       293.6 lb Date of Birth:  12-16-1980         BSA:          2.406 m Patient Age:    39 years          BP:           133/97 mmHg Patient Gender: F                 HR:           108 bpm. Exam Location:  Inpatient Procedure: 2D Echo, Cardiac Doppler, Color Doppler and Intracardiac            Opacification Agent Indications:    CHF-Acute diastolic  History:        Patient has prior history of Echocardiogram examinations, most                 recent 07/22/2009. CHF, Signs/Symptoms:Shortness of Breath; Risk                 Factors:Sleep Apnea.  Sonographer:    Ross Ludwig RDCS (AE) Referring Phys: [id number] [place] [facility]  Sonographer Comments: Patient is morbidly obese. IMPRESSIONS  1. There is no left ventricular thrombus with Definity contrast. Left ventricular ejection fraction, by estimation, is 15-20%. The left ventricle has severely decreased function. The left ventricle demonstrates global hypokinesis. The left ventricular internal cavity size was moderately dilated. Left ventricular diastolic parameters are consistent with Grade III diastolic dysfunction (restrictive). Elevated left atrial pressure.  2. Right ventricular systolic function is moderately reduced. The right ventricular size is normal. There is moderately elevated pulmonary artery  systolic pressure. The estimated right ventricular systolic pressure is 45.0 mmHg.  3. Left atrial size was severely dilated.  4. Right atrial size was moderately dilated.  5. The pericardial effusion is circumferential.  6. The mitral valve is normal in structure. Mild to moderate mitral valve regurgitation.  7. The aortic valve is normal in structure. Aortic valve regurgitation is not visualized. No aortic stenosis is present.  8. The inferior vena cava is dilated in size with <50% respiratory variability, suggesting right atrial pressure of 15 mmHg. FINDINGS  Left Ventricle: There is no left ventricular thrombus with Definity contrast. Left ventricular ejection fraction, by estimation, is 15-20%. The left ventricle has severely decreased function. The left ventricle demonstrates global hypokinesis. Definity contrast agent  was given IV to delineate the left ventricular endocardial borders. The left ventricular internal cavity size was moderately dilated. There is no left ventricular hypertrophy. Left ventricular diastolic parameters are consistent with Grade  III diastolic dysfunction (restrictive). Elevated left atrial pressure. Right Ventricle: The right ventricular size is normal. No increase in right ventricular wall thickness. Right ventricular systolic function is moderately reduced. There is moderately elevated pulmonary artery systolic pressure. The tricuspid regurgitant velocity is 2.74 m/s, and with an assumed right atrial pressure of 15 mmHg, the estimated right ventricular systolic pressure is 45.0 mmHg. Left Atrium: Left atrial size was severely dilated. Right Atrium: Right atrial size was moderately dilated. Pericardium: Trivial pericardial effusion is present. The pericardial effusion is circumferential. Mitral Valve: The mitral valve is normal in structure. Mild to moderate mitral valve regurgitation, with centrally-directed jet. Tricuspid Valve: The tricuspid valve is normal in structure.  Tricuspid valve regurgitation is mild. Aortic Valve: The aortic valve is normal in structure. Aortic valve regurgitation is not visualized. No aortic stenosis is present. Aortic valve mean gradient measures 2.0 mmHg. Aortic valve peak gradient measures 2.8 mmHg. Aortic valve area, by VTI measures 1.46 cm. Pulmonic Valve: The pulmonic valve was grossly normal. Pulmonic valve regurgitation is trivial. Aorta: The aortic root and ascending aorta are structurally normal, with no evidence of dilitation. Venous: The inferior vena cava is dilated in size with less than 50% respiratory variability, suggesting right atrial pressure of 15 mmHg. IAS/Shunts: No atrial level shunt detected by color flow Doppler.  LEFT VENTRICLE PLAX 2D LVIDd:         6.18 cm LVIDs:         5.56 cm LV PW:         1.10 cm LV IVS:        1.10 cm LVOT diam:     1.80 cm LV SV:         18 LV SV Index:   7 LVOT Area:     2.54 cm  RIGHT VENTRICLE            IVC RV Basal diam:  3.40 cm    IVC diam: 2.70 cm RV S prime:     6.46 cm/s TAPSE (M-mode): 1.1 cm LEFT ATRIUM             Index       RIGHT ATRIUM           Index LA diam:        4.50 cm 1.87 cm/m  RA Area:     23.90 cm LA Vol (A2C):   91.6 ml 38.07 ml/m RA Volume:   72.40 ml  30.09 ml/m LA Vol (A4C):   97.6 ml 40.57 ml/m LA Biplane Vol: 99.4 ml 41.31 ml/m  AORTIC VALVE AV Area (Vmax):    1.53 cm AV Area (Vmean):   1.43 cm AV Area (VTI):     1.46 cm AV Vmax:           84.30 cm/s AV Vmean:          61.300 cm/s AV VTI:            0.122 m AV Peak Grad:      2.8 mmHg AV Mean Grad:      2.0 mmHg LVOT Vmax:         50.80 cm/s LVOT Vmean:        34.400 cm/s LVOT VTI:          0.070 m LVOT/AV  VTI ratio: 0.57  AORTA Ao Root diam: 2.50 cm Ao Asc diam:  2.80 cm MR Peak grad:    72.6 mmHg   TRICUSPID VALVE MR Mean grad:    45.0 mmHg   TR Peak grad:   30.0 mmHg MR Vmax:         426.00 cm/s TR Vmax:        274.00 cm/s MR Vmean:        311.0 cm/s MR PISA:         1.01 cm    SHUNTS MR PISA Eff ROA: 9 mm        Systemic VTI:  0.07 m MR PISA Radius:  0.40 cm     Systemic Diam: 1.80 cm Rachelle Hora Croitoru MD Electronically signed by Thurmon Fair MD Signature Date/Time: 08/24/2020/6:09:06 PM    Final    VAS [place] LOWER EXTREMITY VENOUS (DVT)  Result Date: 08/24/2020  Lower Venous DVT Study Indications: Swelling, and Edema.  Limitations: Poor ultrasound/tissue interface and body habitus. Comparison Study: no prior Performing Technologist: Blanch Media RVS  Examination Guidelines: A complete evaluation includes B-mode imaging, spectral Doppler, color Doppler, and power Doppler as needed of all accessible portions of each vessel. Bilateral testing is considered an integral part of a complete examination. Limited examinations for reoccurring indications may be performed as noted. The reflux portion of the exam is performed with the patient in reverse Trendelenburg.  +---------+---------------+---------+-----------+----------+-------------------+ RIGHT    CompressibilityPhasicitySpontaneityPropertiesThrombus Aging      +---------+---------------+---------+-----------+----------+-------------------+ CFV      Full           Yes      Yes                                      +---------+---------------+---------+-----------+----------+-------------------+ SFJ      Full                                                             +---------+---------------+---------+-----------+----------+-------------------+ FV Prox  Full                                                             +---------+---------------+---------+-----------+----------+-------------------+ FV Mid                  Yes      Yes                                      +---------+---------------+---------+-----------+----------+-------------------+ FV Distal               Yes      Yes                                      +---------+---------------+---------+-----------+----------+-------------------+ PFV      Full                                                              +---------+---------------+---------+-----------+----------+-------------------+  POP      Full           Yes      Yes                                      +---------+---------------+---------+-----------+----------+-------------------+ PTV      Full                                                             +---------+---------------+---------+-----------+----------+-------------------+ PERO                                                  Not well visualized +---------+---------------+---------+-----------+----------+-------------------+   +---------+---------------+---------+-----------+----------+--------------+ LEFT     CompressibilityPhasicitySpontaneityPropertiesThrombus Aging +---------+---------------+---------+-----------+----------+--------------+ CFV      Full           Yes      Yes                                 +---------+---------------+---------+-----------+----------+--------------+ SFJ      Full                                                        +---------+---------------+---------+-----------+----------+--------------+ FV Prox  Full                                                        +---------+---------------+---------+-----------+----------+--------------+ FV Mid                  Yes      Yes                                 +---------+---------------+---------+-----------+----------+--------------+ FV Distal               Yes      Yes                                 +---------+---------------+---------+-----------+----------+--------------+ PFV      Full                                                        +---------+---------------+---------+-----------+----------+--------------+ POP      Full           Yes      Yes                                 +---------+---------------+---------+-----------+----------+--------------+  PTV      Full                                                         +---------+---------------+---------+-----------+----------+--------------+ PERO     Full                                                        +---------+---------------+---------+-----------+----------+--------------+     Summary: RIGHT: - There is no evidence of deep vein thrombosis in the lower extremity. However, portions of this examination were limited- see technologist comments above.  - No cystic structure found in the popliteal fossa.  LEFT: - There is no evidence of deep vein thrombosis in the lower extremity. However, portions of this examination were limited- see technologist comments above.  - No cystic structure found in the popliteal fossa.  *See table(s) above for measurements and observations.    Preliminary    [place] MFM OB LIMITED  Result Date: 08/25/2020 ----------------------------------------------------------------------  OBSTETRICS REPORT                       (Signed Final 08/25/2020 01:20 pm) ---------------------------------------------------------------------- Patient Info  ID #:       [id number]                          D.O.B.:  Jul 02, 1981 (39 yrs)  Name:       [name]               Visit Date: 08/25/2020 10:27 am ---------------------------------------------------------------------- Performed By  Attending:        Ma Rings MD         Ref. Address:     [facility]                                                             OB/GYN &                                                             Infertility                                                             [address]  [address]                                                             [place], [place]                                                             [id number]  Performed By:     Percell Boston          Secondary Phy.:   [facility]                    RDMS                                                              Care  Referred By:      Alphonsus Sias                Location:         [facility]                              [facility] ---------------------------------------------------------------------- Orders  #  Description                           Code        Ordered By  1  [place] MFM OB LIMITED                     [id number]    CASSANDRA LAW ----------------------------------------------------------------------  #  Order #                     Accession #                Episode #  1  [id number]                   [id number]                 [id number] ---------------------------------------------------------------------- Indications  Gestational diabetes in pregnancy, (on         O24.419  insulin)  Hypertension - Chronic/Pre-existing (on        O10.019  Labetolol)  [redacted] weeks gestation of pregnancy                Z3A.31  Pregnancy resulting from assisted              O09.819  reproductive technology  Obesity complicating pregnancy, third          O99.212  trimester (BMI:40-45)  Asthma  O34.89 j70.909  Advanced maternal age primigravida 38+,        O94.512  third trimester  NIPS:LOw risk, AFP: Neg ---------------------------------------------------------------------- Fetal Evaluation  Num Of Fetuses:         1  Fetal Heart Rate(bpm):  143  Cardiac Activity:       Observed  Presentation:           Breech  Placenta:               Anterior  Amniotic Fluid  AFI FV:      Within normal limits  AFI Sum(cm)     %Tile       Largest Pocket(cm)  12.9            38          4.3  RUQ(cm)       RLQ(cm)       LUQ(cm)        LLQ(cm)  4.3           2.3           4.1            2.2 ---------------------------------------------------------------------- OB History  Gravidity:    1 ---------------------------------------------------------------------- Gestational Age  LMP:           31w 2d        Date:  01/19/20                 EDD:   10/25/20  Best:          31w 2d     Det. By:  LMP   (01/19/20)          EDD:   10/25/20 ---------------------------------------------------------------------- Anatomy  Thoracic:              Appears normal         Bladder:                Appears normal  Stomach:               Appears normal, left                         sided ---------------------------------------------------------------------- Cervix Uterus Adnexa  Cervix  Not visualized (advanced GA >24wks)  Uterus  No abnormality visualized.  Right Ovary  No adnexal mass visualized.  Left Ovary  No adnexal mass visualized.  Cul De Sac  No free fluid seen.  Adnexa  No abnormality visualized. ---------------------------------------------------------------------- Comments  This patient has been hospitalized due to maternal heart  failure with a systolic ejection fraction of between 15 to 20%.  A limited ultrasound performed today shows normal amniotic  fluid.  The fetus is in the breech presentation today.  Fetal  breathing and fetal body movements were noted throughout  today's ultrasound exam.  The patient should continue to be treated with the appropriate  medications for the treatment of heart failure. ----------------------------------------------------------------------                   Ma Rings, MD Electronically Signed Final Report   08/25/2020 01:20 pm ----------------------------------------------------------------------    Medications:     Current Medications: . aspirin  81 mg Oral QHS  . betamethasone acetate-betamethasone sodium phosphate  12 mg Intramuscular Q24 Hr x 2  . budesonide  0.25 mg Nebulization BID  . docusate sodium  100 mg Oral Daily  . furosemide  40 mg Intravenous BID  . heparin  injection (subcutaneous)  10,000 Units Subcutaneous Q12H  . hydrALAZINE  25 mg Oral Q8H  . insulin aspart  0-12 Units Subcutaneous TID PC  . insulin glargine  32 Units Subcutaneous QHS  . [START ON 08/26/2020] metoprolol succinate  100 mg Oral Daily  . metoprolol tartrate  50 mg Oral BID  .  prenatal multivitamin  1 tablet Oral Q1200  . valACYclovir  1,000 mg Oral QHS  . [START ON 08/31/2020] Vitamin D (Ergocalciferol)  50,000 Units Oral Q7 days    Infusions: . magnesium sulfate bolus IVPB        Patient Profile   Kelli Talsma is a 39 year old  history of cardiomegaly, DM, HTN, asthma, OSA, and obesity. She is [redacted] weeks pregnant and this has been complicated by HTN and DM.   Newly diagnosed biventricular heart failure.   Assessment/Plan   1. Acute Biventricular Heart Failure  -Echo this admit severely reduced EF 15-20%  On exam volume overloaded. Appears warm. Does not appear to be low ouput HF.  -Continue IV lasix.  - Continue current dose of bb + hydralazine.  - Add isordil 10 mg tid.  - Hold off on digoxin.  - Avoid ari and spiro with pregnancy  2. HTN possible preeclampsia  See above.  -Would like to come off nifedipine if possible once BP better controlled and diuresed.   3. DMI   4. OSA Recently diagnosed   5. H/O Asthma.     Length of Stay: 1  Amy Clegg, NP  08/25/2020, 2:31 PM  Advanced Heart Failure Team Pager [phone] (M-F; 7a - 4p)  Please contact [facility] for night-coverage after hours (4p -7a ) and weekends on [email or web address]   Patient seen and examined with the above-signed Advanced Practice Provider and/or Housestaff. I personally reviewed laboratory data, imaging studies and relevant notes. I independently examined the patient and formulated the important aspects of the plan. I have edited the note to reflect any of my changes or salient points. I have personally discussed the plan with the patient and/or family.  39 y/o woman with h/o "borderline" HTN, obesity but no previous heart problems. (CXR with cardiomegaly). Now [redacted] weeks pregnant and found to have acute systolic HF due to severe biventricular dysfunction. EF 20%. BP high on presentation. Symptomatically improved with IV diuresis. BP meds adjusted. Father with pacemaker and AF. Aunt  with HF. Prior to being pregnant says she only went to the doctor when she was sick so has not followed her BP closely. Does have h.o taking diet pills including phenteramine.   ECG sinus tach 126 otherwise normal. Personally reviewed  General:  Well appearing. No resp difficulty HEENT: normal Neck: supple. JVP hard to see but looks mildly elevated Carotids 2+ bilat; no bruits. No lymphadenopathy or thryomegaly appreciated. Cor: PMI nondisplaced. Regular tachy. No rubs, gallops or murmurs. Lungs: clear Abdomen: obese/gravis soft, nontender, nondistended. No hepatosplenomegaly. No bruits or masses. Good bowel sounds. Extremities: no cyanosis, clubbing, rash, 3+ edema Neuro: alert & orientedx3, cranial nerves grossly intact. moves all 4 extremities w/o difficulty. Affect pleasant  She is [redacted] weeks pregnant so would not consider this peri-partum CM. 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   Currently no evidence of low output physiology or fetal compromise. In speaking with MFM team would ideally like her to get to 36 weeks.   Agree with switching to metoprolol and adding hydralazine. Will stop nifedipine and switch to isordil. Continue IV diuresis. Place TED hose.  Continue inpatient monitoring for next few days with routine fetal NSTs. If no signs of fetal distress, I think she will then be able to go home with close monitoring of her and her fetus. Can add digoxin if any evidence of compromised cardiac output.   Will repeat echo in 4 weeks. If no signs of LV improvement would likely suggest C-section with hysterectomy or tubal at time of delivery. If evidence of fetal or maternal compromise at any time will need stabilization with inotropes and immediate C-section.  We will follow.   Arvilla Meres, MD  3:06 PM

## 2020-08-25 NOTE — Progress Notes (Signed)
Sitting in bed, comfortable; states feeling much better, SOB much improved since this am; no c/o currently; says she is asking everyone that she sees when she can go home; asking if she can work while in hospital  Temp:  [97.7 F (36.5 C)-98.2 F (36.8 C)] 97.7 F (36.5 C) (12/08 1958) Pulse Rate:  [105-115] 107 (12/08 1958) Resp:  [18-22] 19 (12/08 1958) BP: (114-143)/(73-99) 115/76 (12/08 1958) SpO2:  [90 %-100 %] 99 % (12/08 1958) Weight:  [131.1 kg] 131.1 kg (12/08 1572)  Intake/Output Summary (Last 24 hours) at 08/25/2020 2051 Last data filed at 08/25/2020 1729 Gross per 24 hour  Intake 1780 ml  Output 2700 ml  Net -920 ml   FHT: 140s, nml variability, +accels,no decels Toco: no ctx  A&ox3 Somewhat shallow breathing, ctab rrr Abd: soft, nt, gravid LE: 2-3+ edema, nt bilat; compression socks on, scds not on  A/P: iup at 30.4wga 1. 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 OSA - cpap 4. Asthma - contin albuterol prn 5. Morbid obesity 6. dvt prophylaxis - heparin, scds when not ambulating 7. 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  This can take a few days to determine, has comprehensive management team to address her medical issues.  Would not be focused on discharge at thsi time, but getting better.  Advise against working right now, and can address work status at a later date.  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  Encourage other activities to distract  her while in-patient.

## 2020-08-25 NOTE — Progress Notes (Signed)
Makailee D Shrode 39 y.o. G1P0000 at [redacted]w[redacted]d HD#2 admitted with worsening SOB and subsequent diagnosis of heart failure. Patient was seen by Cardiology last night after echocardiogram revealed EF of 15% with significant LV dilation. She was started on IV Lasix overnight for diuresis and is feeling much improved this morning. She denies any SOB this morning and was able to comfortably walk to the bathroom without becoming SOB. She has been using the bathroom frequently, diuresing well. No complaints of CP or palpitations. Denies any HA or vision changes. Tolerating a diabetic diet well. Endorses good FM. Does mention mild cramping this morning but is not uncomfortable and denies any VB or LOF.   Patient's mother is at bedside providing emotional support. The patient and mom both have many questions regarding next steps, delivery timing, and overall prognosis. Patient is coping well with new diagnosis thus far.   O: Vitals:   08/25/20 0605 08/25/20 0611 08/25/20 0613 08/25/20 0827  BP:   122/81 130/73  Pulse:   (!) 111 (!) 112  Resp:   18 18  Temp:    98 F (36.7 C)  TempSrc:    Oral  SpO2: 96%  98% 99%  Weight:  131.1 kg    Height:       Physical Exam: General: AAO, NAD Heart: tachycardic, regular rhythm  Lungs: CTABL no audible wheezes, crackles, or rales Abdomen: soft non-tender, gravid uterus Ext: +1-2 LE edema, neg Homan's b/l, neg calf tenderness  NST pending this AM  CMP     Component Value Date/Time   NA 134 (L) 08/25/2020 0328   K 4.2 08/25/2020 0328   CL 105 08/25/2020 0328   CO2 19 (L) 08/25/2020 0328   GLUCOSE 107 (H) 08/25/2020 0328   BUN 10 08/25/2020 0328   CREATININE 0.86 08/25/2020 0328   CREATININE 0.80 09/02/2019 0914   CALCIUM 9.0 08/25/2020 0328   PROT 6.7 08/25/2020 0328   ALBUMIN 2.7 (L) 08/25/2020 0328   AST 27 08/25/2020 0328   ALT 33 08/25/2020 0328   ALKPHOS 190 (H) 08/25/2020 0328   BILITOT 1.6 (H) 08/25/2020 0328   GFRNONAA >60 08/25/2020 0328    GFRAA >60 09/19/2018 1315   CBC    Component Value Date/Time   WBC 13.3 (H) 08/25/2020 0328   RBC 4.72 08/25/2020 0328   HGB 11.0 (L) 08/25/2020 0328   HGB 11.8 07/23/2009 1131   HCT 35.6 (L) 08/25/2020 0328   HCT 34.7 (L) 07/23/2009 1131   PLT 336 08/25/2020 0328   PLT 321 07/23/2009 1131   MCV 75.4 (L) 08/25/2020 0328   MCV 68 (L) 07/23/2009 1131   MCH 23.3 (L) 08/25/2020 0328   MCHC 30.9 08/25/2020 0328   RDW 16.6 (H) 08/25/2020 0328   RDW 15.5 (H) 07/23/2009 1131   LYMPHSABS 1.1 08/25/2020 0328   LYMPHSABS 3.0 07/23/2009 1131   MONOABS 0.1 08/25/2020 0328   EOSABS 0.0 08/25/2020 0328   EOSABS 0.3 07/23/2009 1131   BASOSABS 0.0 08/25/2020 0328   BASOSABS 0.1 07/23/2009 1131   I/O last 3 completed shifts: In: 600 [P.O.:600] Out: 1400 [Urine:1400] No intake/output data recorded.     A/P: Meeah D Walczyk 39 y.o. G1P0000 at [redacted]w[redacted]d HD#2 admitted with worsening SOB, new diagnosis of heart failure, now with improvement of symptoms  Patient is now s/p echocardiogram significant for EF 15% consistent with heart failure. She had initial bedside cardiology consult overnight. She received 40 mg IV Lasix which resulted in appropriate diuresis of 1400cc UOP  and symptomatic improvement of SOB. Patient has been on Tele overnight, around 3AM there was a short run of PAC's noted, however patient remained sleeping during that time per RN and no additional arrhythmias notified. Advanced heart failure team consultation pending for this morning.   MFM consultation called this morning. Spoke with Dr. Grace Bushy, who is aware of the patient's admitting diagnosis and current clinical status. Follow up recommendations later this morning.  At length discussion had with patient and her mom regarding severity of her current cardiac status, necessitating a multidisciplinary approach. At the moment, no indication for emergent delivery. However, counseled patient on preterm delivery very likely and  delivery planning will be highly dependent on her clinical status and the recommendations provided by both cardiology and maternal fetal medicine. Emotional support provided.  For now, will continue current care as follows: 1. Tele monitoring, IV Lasix 40mg  BID per Cardio. K+ stable this morning, repeat BMP this afternoon and replete as needed. Strict I's/O's and daily weights.  2. cHTN- BP's well controlled on Labetalol 100 mg TID and Procardia 30XL, cont to monitor BP's. PIH labs this morning WNL. Continue ASA 81mg  preE ppx 3. IDDM- Diabetic diet with FSG monitoring QID, Lantus 32U qHS with ISS coverage w/ meals per protocol 4. Fetal status- AGA on growth from 12/2, cont fetal monitoring with NST TID. S/p 1st dose of BMZ 12/7 @ 2046, plan for 2nd dose tonight for fetal lung maturity. Currently not anticipating imminent delivery so holding Magnesium for neuroprotection, but would consider if delivery anticipated before 32w 5. VTE PPx: SCD's in bed, s/p 1 dose of Corinne Heparin 10,000U last night, holding this morning for consult recommendations, but plan to continue 10,000U BID if no imminent delivery 6. Continue current inhalers for asthma  7. Continue Valtrex for HSV suppression 8. Routine antepartum care, remain inpatient until otherwise advised   Peytyn Trine A Bretta Fees 08/25/20 9:29 AM

## 2020-08-25 NOTE — Progress Notes (Signed)
PROGRESS NOTE    [name]  XTG:[id number] DOB: January 08, 1981 DOA: 08/24/2020 PCP: Donato Schultz, DO    Brief Narrative:  [name] is a 39 year old female with past medical history significant for cardiomegaly, OSA on CPAP, asthma, prediabetes, morbid obesity; and currently 30 weeks IUP who presented to the ED with progressive shortness of breath and lower extremity edema.  During pregnancy, patient was diagnosed with gestational hypertension and started on labetalol and nifedipine.  Her shortness of breath and long-term edema has been progressing over the last 2 weeks up to the point she is unable to lie flat and is worsened with any type of exertion.  She has been utilizing her home albuterol without any significant effect in her symptoms.  Additionally recently diagnosed with OSA but having issues with her home CPAP machine.  Hospital service was consulted by [facility] service for assistance with shortness of breath in the antepartum setting.   Assessment & Plan:   Active Problems:   Morbid obesity (HCC)   Shortness of breath   Pulmonary vascular congestion   Acute on chronic diastolic congestive heart failure (HCC)   Edema   Acute combined systolic and diastolic heart failure, NYHA class 4 (HCC)   Essential hypertension   Cardiomyopathy Combined systolic and diastolic congestive heart failure, new diagnosis Patient presenting with progressive shortness of breath and lower extremity edema over the past 2 weeks.  Unable to lie flat and the symptoms worsened with exertion.  TTE with LVEF 15-20%, global hypokinesis, grade 2 diastolic dysfunction, RV function moderately reduced, LA severely dilated, RA mildly dilated, IVC dilated.  Bilateral duplex ultrasound lower extremities negative for DVT.  CT angiogram chest negative for PE but with pulmonary edema. --Cardiology/heart failure service following, appreciate assistance --net negative past 24h with one unmeasured  urine occurance and net negative 1.8L since admission --wt 133.2>131.1kg --continue furosemide 40 mg IV q12h --Continue monitor strict I's and O's and daily weights --Cardiology recommending cardiac MRI at a later date --May benefit from ARB/Entresto following delivery/lactation, will need repeat echo in 3 months  Essential hypertension --Metoprolol succinate 100 mg p.o. daily --Nifedipine 30 mg PO qHS --Hydralazine 25 mg q8h --continue to monitor BP closely  Type 2 diabetes mellitus On Metformin and Lantus 32 units subcutaneously daily outpatient.  Hemoglobin A1c 5.8 on 09/19/2018. --Hold Metformin while inpatient --Change NPH back to her Lantus 13 units subcutaneously qHS --Update hemoglobin A1c in the a.m. --Insulin/scale for further coverage --CBGs before every meal/at bedtime  Asthma --Continue albuterol as needed  OSA --Continue nocturnal CPAP  Morbid obesity Body mass index is 43.94 kg/m.  Discussed with patient needs for aggressive weight loss measures/lifestyle changes this complicates all facets of care.    DVT prophylaxis: Heparin Code Status: Full code Family Communication: Mother present at bedside  Disposition Plan: Per primary service, obstetrics   Procedures:   TTE 08/24/2020  Antimicrobials:   None   Subjective: Patient seen and examined bedside, resting comfortably lying in bed.  Mother present at bedside.  States shortness of breath and lower extremity edema has significantly improved with IV diuresis.  Now able to ambulate to the bathroom without any significant dyspnea.  Continues with good urine output.  No other complaints or concerns at this time.  Denies headache, no visual changes, no chest pain, no shortness of breath currently, no palpitations, no weakness, no fever/chills/night sweats, no nausea/vomiting/diarrhea.  No acute events overnight per nursing staff.  Objective: Vitals:   08/25/20  0827 08/25/20 0958 08/25/20 1001 08/25/20 1136   BP: 130/73  124/76 117/78  Pulse: (!) 112  (!) 115 (!) 111  Resp: 18     Temp: 98 F (36.7 C)   98.1 F (36.7 C)  TempSrc: Oral   Oral  SpO2: 99% 100%  100%  Weight:      Height:        Intake/Output Summary (Last 24 hours) at 08/25/2020 1237 Last data filed at 08/25/2020 1139 Gross per 24 hour  Intake 600 ml  Output 2350 ml  Net -1750 ml   Filed Weights   08/24/20 1152 08/25/20 0611  Weight: 133.2 kg 131.1 kg    Examination:  General exam: Appears calm and comfortable  Respiratory system: Clear to auscultation. Respiratory effort normal.  Oxygenating well on room air Cardiovascular system: S1 & S2 heard, RRR. No JVD, murmurs, rubs, gallops or clicks.  2+ pitting edema bilateral lower extremities to mid shin. Gastrointestinal system: Abdomen is nondistended, soft and nontender. No organomegaly or masses felt. Normal bowel sounds heard. Central nervous system: Alert and oriented. No focal neurological deficits. Extremities: Symmetric 5 x 5 power. Skin: No rashes, lesions or ulcers Psychiatry: Judgement and insight appear normal. Mood & affect appropriate.     Data Reviewed: I have personally reviewed following labs and imaging studies  CBC: Recent Labs  Lab 08/24/20 1242 08/25/20 0328  WBC 15.1* 13.3*  NEUTROABS  --  12.1*  HGB 11.4* 11.0*  HCT 35.9* 35.6*  MCV 74.9* 75.4*  PLT 360 336   Basic Metabolic Panel: Recent Labs  Lab 08/24/20 1242 08/25/20 0328  NA 135 134*  K 4.2 4.2  CL 104 105  CO2 20* 19*  GLUCOSE 127* 107*  BUN 7 10  CREATININE 0.80 0.86  CALCIUM 8.8* 9.0  MG  --  1.5*  PHOS  --  3.9   GFR: Estimated Creatinine Clearance: 125.9 mL/min (by C-G formula based on SCr of 0.86 mg/dL). Liver Function Tests: Recent Labs  Lab 08/24/20 1242 08/25/20 0328  AST 29 27  ALT 32 33  ALKPHOS 203* 190*  BILITOT 1.2 1.6*  PROT 6.5 6.7  ALBUMIN 2.7* 2.7*   No results for input(s): LIPASE, AMYLASE in the last 168 hours. No results for  input(s): AMMONIA in the last 168 hours. Coagulation Profile: No results for input(s): INR, PROTIME in the last 168 hours. Cardiac Enzymes: No results for input(s): CKTOTAL, CKMB, CKMBINDEX, TROPONINI in the last 168 hours. BNP (last 3 results) No results for input(s): PROBNP in the last 8760 hours. HbA1C: No results for input(s): HGBA1C in the last 72 hours. CBG: Recent Labs  Lab 08/25/20 0611 08/25/20 0900  GLUCAP 118* 135*   Lipid Profile: No results for input(s): CHOL, HDL, LDLCALC, TRIG, CHOLHDL, LDLDIRECT in the last 72 hours. Thyroid Function Tests: Recent Labs    08/25/20 0328  TSH 1.986   Anemia Panel: No results for input(s): VITAMINB12, FOLATE, FERRITIN, TIBC, IRON, RETICCTPCT in the last 72 hours. Sepsis Labs: No results for input(s): PROCALCITON, LATICACIDVEN in the last 168 hours.  Recent Results (from the past 240 hour(s))  Resp Panel by RT-PCR (Flu A&B, Covid) Nasopharyngeal Swab     Status: None   Collection Time: 08/24/20 12:48 PM   Specimen: Nasopharyngeal Swab; Nasopharyngeal(NP) swabs in vial transport medium  Result Value Ref Range Status   SARS Coronavirus 2 by RT PCR NEGATIVE NEGATIVE Final    Comment: (NOTE) SARS-CoV-2 target nucleic acids are NOT DETECTED.  The  SARS-CoV-2 RNA is generally detectable in upper respiratory specimens during the acute phase of infection. The lowest concentration of SARS-CoV-2 viral copies this assay can detect is 138 copies/mL. A negative result does not preclude SARS-Cov-2 infection and should not be used as the sole basis for treatment or other patient management decisions. A negative result may occur with  improper specimen collection/handling, submission of specimen other than nasopharyngeal swab, presence of viral mutation(s) within the areas targeted by this assay, and inadequate number of viral copies(<138 copies/mL). A negative result must be combined with clinical observations, patient history, and  epidemiological information. The expected result is Negative.  Fact Sheet for Patients:  BloggerCourse.com  Fact Sheet for Healthcare Providers:  SeriousBroker.it  This test is no t yet approved or cleared by the [place] FDA and  has been authorized for detection and/or diagnosis of SARS-CoV-2 by FDA under an Emergency Use Authorization (EUA). This EUA will remain  in effect (meaning this test can be used) for the duration of the COVID-19 declaration under Section 564(b)(1) of the Act, 21 U.S.C.section 360bbb-3(b)(1), unless the authorization is terminated  or revoked sooner.       Influenza A by PCR NEGATIVE NEGATIVE Final   Influenza B by PCR NEGATIVE NEGATIVE Final    Comment: (NOTE) The Xpert Xpress SARS-CoV-2/FLU/RSV plus assay is intended as an aid in the diagnosis of influenza from Nasopharyngeal swab specimens and should not be used as a sole basis for treatment. Nasal washings and aspirates are unacceptable for Xpert Xpress SARS-CoV-2/FLU/RSV testing.  Fact Sheet for Patients: BloggerCourse.com  Fact Sheet for Healthcare Providers: SeriousBroker.it  This test is not yet approved or cleared by the [place] FDA and has been authorized for detection and/or diagnosis of SARS-CoV-2 by FDA under an Emergency Use Authorization (EUA). This EUA will remain in effect (meaning this test can be used) for the duration of the COVID-19 declaration under Section 564(b)(1) of the Act, 21 U.S.C. section 360bbb-3(b)(1), unless the authorization is terminated or revoked.  Performed at [facility], [address], [place], [place] [address]          Radiology Studies: DG Chest 1 View  Result Date: 08/25/2020 CLINICAL DATA:  CHF, pregnant, asthma EXAM: CHEST  1 VIEW COMPARISON:  Portable exam 0655 hours compared to 08/24/2020 FINDINGS: Enlargement of cardiac  silhouette with pulmonary vascular congestion. Mediastinal contours normal. Slightly improved interstitial infiltrate, likely improved edema. No pleural effusion or pneumothorax. IMPRESSION: Slightly improved probable pulmonary edema. Electronically Signed   By: Ulyses Southward M.D.   On: 08/25/2020 08:27   CT Angio Chest PE W and/or Wo Contrast  Result Date: 08/24/2020 CLINICAL DATA:  39 year old female pregnant female in the 3rd trimester with progressive shortness of breath. EXAM: CT ANGIOGRAPHY CHEST WITH CONTRAST TECHNIQUE: Multidetector CT imaging of the chest was performed using the standard protocol during bolus administration of intravenous contrast. Multiplanar CT image reconstructions and MIPs were obtained to evaluate the vascular anatomy. CONTRAST:  75mL OMNIPAQUE IOHEXOL 350 MG/ML SOLN COMPARISON:  Portable chest radiograph earlier today. FINDINGS: Cardiovascular: Good contrast bolus timing in the pulmonary arterial tree. Mild respiratory motion. No focal filling defect identified in the pulmonary arteries to suggest acute pulmonary embolism. Mild cardiomegaly with small volume pericardial effusion and pericardial fluid tracking into the superior mediastinum. No contrast in the aorta on this exam. Mediastinum/Nodes: Small volume of pericardial effusion suspected tracking into the superior pericardial recesses. No mediastinal mass or lymphadenopathy is evident. Lungs/Pleura: Layering right  greater than left pleural effusions with simple fluid density suggesting transudate (series 6, image 57). Up to moderate volume effusion on the right. Partially sub pulmonic effusion on the left. Major airways are patent. Confluent bilateral pulmonary ground-glass opacity with mosaic attenuation at the lung bases (series 8, image 78). There are some areas of more confluent peribronchial ground-glass opacity but no consolidation. Upper Abdomen: Trace contrast reflux into the hepatic IVC and hepatic veins. Negative  visible spleen and stomach. Musculoskeletal: No acute osseous abnormality identified. Review of the MIP images confirms the above findings. IMPRESSION: 1. Negative for acute pulmonary embolus. 2. Constellation favoring cardiogenic pulmonary edema: - mild cardiomegaly with small pericardial effusion, - right greater than left layering pleural effusions, - diffuse pulmonary ground-glass opacity, perhaps with superimposed air trapping at the lung bases resulting in mosaic attenuation. A viral/atypical respiratory infection was considered but felt unlikely. Electronically Signed   By: Odessa Fleming M.D.   On: 08/24/2020 17:43   DG CHEST PORT 1 VIEW  Result Date: 08/24/2020 CLINICAL DATA:  Shortness of breath. EXAM: PORTABLE CHEST 1 VIEW COMPARISON:  09/24/2008 FINDINGS: The heart is mildly enlarged despite the AP projection and portable technique. There is central vascular congestion and slight increased interstitial markings which could suggest mild edema. No pleural effusions or focal infiltrates. The bony thorax is intact. IMPRESSION: Cardiac enlargement with vascular congestion and possible mild interstitial edema. No definite infiltrates or effusions. Electronically Signed   By: Rudie Meyer M.D.   On: 08/24/2020 12:48   ECHOCARDIOGRAM COMPLETE  Result Date: 08/24/2020    ECHOCARDIOGRAM REPORT   Patient Name:   JEN BENEDICT Date of Exam: 08/24/2020 Medical Rec #:  [id number]         Height:       68.0 in Accession #:    [id number]        Weight:       293.6 lb Date of Birth:  [birth year]         BSA:          2.406 m Patient Age:    39 years          BP:           133/97 mmHg Patient Gender: F                 HR:           108 bpm. Exam Location:  Inpatient Procedure: 2D Echo, Cardiac Doppler, Color Doppler and Intracardiac            Opacification Agent Indications:    CHF-Acute diastolic  History:        Patient has prior history of Echocardiogram examinations, most                 recent 07/22/2009. CHF,  Signs/Symptoms:Shortness of Breath; Risk                 Factors:Sleep Apnea.  Sonographer:    Ross Ludwig RDCS (AE) Referring Phys: [id number] [facility]  Sonographer Comments: Patient is morbidly obese. IMPRESSIONS  1. There is no left ventricular thrombus with Definity contrast. Left ventricular ejection fraction, by estimation, is 15-20%. The left ventricle has severely decreased function. The left ventricle demonstrates global hypokinesis. The left ventricular internal cavity size was moderately dilated. Left ventricular diastolic parameters are consistent with Grade III diastolic dysfunction (restrictive). Elevated left atrial pressure.  2. Right ventricular systolic function is moderately reduced. The right ventricular size is  normal. There is moderately elevated pulmonary artery systolic pressure. The estimated right ventricular systolic pressure is 45.0 mmHg.  3. Left atrial size was severely dilated.  4. Right atrial size was moderately dilated.  5. The pericardial effusion is circumferential.  6. The mitral valve is normal in structure. Mild to moderate mitral valve regurgitation.  7. The aortic valve is normal in structure. Aortic valve regurgitation is not visualized. No aortic stenosis is present.  8. The inferior vena cava is dilated in size with <50% respiratory variability, suggesting right atrial pressure of 15 mmHg. FINDINGS  Left Ventricle: There is no left ventricular thrombus with Definity contrast. Left ventricular ejection fraction, by estimation, is 15-20%. The left ventricle has severely decreased function. The left ventricle demonstrates global hypokinesis. Definity contrast agent was given IV to delineate the left ventricular endocardial borders. The left ventricular internal cavity size was moderately dilated. There is no left ventricular hypertrophy. Left ventricular diastolic parameters are consistent with Grade  III diastolic dysfunction (restrictive). Elevated left atrial pressure.  Right Ventricle: The right ventricular size is normal. No increase in right ventricular wall thickness. Right ventricular systolic function is moderately reduced. There is moderately elevated pulmonary artery systolic pressure. The tricuspid regurgitant velocity is 2.74 m/s, and with an assumed right atrial pressure of 15 mmHg, the estimated right ventricular systolic pressure is 45.0 mmHg. Left Atrium: Left atrial size was severely dilated. Right Atrium: Right atrial size was moderately dilated. Pericardium: Trivial pericardial effusion is present. The pericardial effusion is circumferential. Mitral Valve: The mitral valve is normal in structure. Mild to moderate mitral valve regurgitation, with centrally-directed jet. Tricuspid Valve: The tricuspid valve is normal in structure. Tricuspid valve regurgitation is mild. Aortic Valve: The aortic valve is normal in structure. Aortic valve regurgitation is not visualized. No aortic stenosis is present. Aortic valve mean gradient measures 2.0 mmHg. Aortic valve peak gradient measures 2.8 mmHg. Aortic valve area, by VTI measures 1.46 cm. Pulmonic Valve: The pulmonic valve was grossly normal. Pulmonic valve regurgitation is trivial. Aorta: The aortic root and ascending aorta are structurally normal, with no evidence of dilitation. Venous: The inferior vena cava is dilated in size with less than 50% respiratory variability, suggesting right atrial pressure of 15 mmHg. IAS/Shunts: No atrial level shunt detected by color flow Doppler.  LEFT VENTRICLE PLAX 2D LVIDd:         6.18 cm LVIDs:         5.56 cm LV PW:         1.10 cm LV IVS:        1.10 cm LVOT diam:     1.80 cm LV SV:         18 LV SV Index:   7 LVOT Area:     2.54 cm  RIGHT VENTRICLE            IVC RV Basal diam:  3.40 cm    IVC diam: 2.70 cm RV S prime:     6.46 cm/s TAPSE (M-mode): 1.1 cm LEFT ATRIUM             Index       RIGHT ATRIUM           Index LA diam:        4.50 cm 1.87 cm/m  RA Area:     23.90 cm  LA Vol (A2C):   91.6 ml 38.07 ml/m RA Volume:   72.40 ml  30.09 ml/m LA Vol (A4C):   97.6 ml  40.57 ml/m LA Biplane Vol: 99.4 ml 41.31 ml/m  AORTIC VALVE AV Area (Vmax):    1.53 cm AV Area (Vmean):   1.43 cm AV Area (VTI):     1.46 cm AV Vmax:           84.30 cm/s AV Vmean:          61.300 cm/s AV VTI:            0.122 m AV Peak Grad:      2.8 mmHg AV Mean Grad:      2.0 mmHg LVOT Vmax:         50.80 cm/s LVOT Vmean:        34.400 cm/s LVOT VTI:          0.070 m LVOT/AV VTI ratio: 0.57  AORTA Ao Root diam: 2.50 cm Ao Asc diam:  2.80 cm MR Peak grad:    72.6 mmHg   TRICUSPID VALVE MR Mean grad:    45.0 mmHg   TR Peak grad:   30.0 mmHg MR Vmax:         426.00 cm/s TR Vmax:        274.00 cm/s MR Vmean:        311.0 cm/s MR PISA:         1.01 cm    SHUNTS MR PISA Eff ROA: 9 mm       Systemic VTI:  0.07 m MR PISA Radius:  0.40 cm     Systemic Diam: 1.80 cm Rachelle Hora Croitoru MD Electronically signed by Thurmon Fair MD Signature Date/Time: 08/24/2020/6:09:06 PM    Final    VAS [place] LOWER EXTREMITY VENOUS (DVT)  Result Date: 08/24/2020  Lower Venous DVT Study Indications: Swelling, and Edema.  Limitations: Poor ultrasound/tissue interface and body habitus. Comparison Study: no prior Performing Technologist: Blanch Media RVS  Examination Guidelines: A complete evaluation includes B-mode imaging, spectral Doppler, color Doppler, and power Doppler as needed of all accessible portions of each vessel. Bilateral testing is considered an integral part of a complete examination. Limited examinations for reoccurring indications may be performed as noted. The reflux portion of the exam is performed with the patient in reverse Trendelenburg.  +---------+---------------+---------+-----------+----------+-------------------+ RIGHT    CompressibilityPhasicitySpontaneityPropertiesThrombus Aging      +---------+---------------+---------+-----------+----------+-------------------+ CFV      Full           Yes      Yes                                       +---------+---------------+---------+-----------+----------+-------------------+ SFJ      Full                                                             +---------+---------------+---------+-----------+----------+-------------------+ FV Prox  Full                                                             +---------+---------------+---------+-----------+----------+-------------------+ FV Mid  Yes      Yes                                      +---------+---------------+---------+-----------+----------+-------------------+ FV Distal               Yes      Yes                                      +---------+---------------+---------+-----------+----------+-------------------+ PFV      Full                                                             +---------+---------------+---------+-----------+----------+-------------------+ POP      Full           Yes      Yes                                      +---------+---------------+---------+-----------+----------+-------------------+ PTV      Full                                                             +---------+---------------+---------+-----------+----------+-------------------+ PERO                                                  Not well visualized +---------+---------------+---------+-----------+----------+-------------------+   +---------+---------------+---------+-----------+----------+--------------+ LEFT     CompressibilityPhasicitySpontaneityPropertiesThrombus Aging +---------+---------------+---------+-----------+----------+--------------+ CFV      Full           Yes      Yes                                 +---------+---------------+---------+-----------+----------+--------------+ SFJ      Full                                                        +---------+---------------+---------+-----------+----------+--------------+ FV  Prox  Full                                                        +---------+---------------+---------+-----------+----------+--------------+ FV Mid                  Yes      Yes                                 +---------+---------------+---------+-----------+----------+--------------+  FV Distal               Yes      Yes                                 +---------+---------------+---------+-----------+----------+--------------+ PFV      Full                                                        +---------+---------------+---------+-----------+----------+--------------+ POP      Full           Yes      Yes                                 +---------+---------------+---------+-----------+----------+--------------+ PTV      Full                                                        +---------+---------------+---------+-----------+----------+--------------+ PERO     Full                                                        +---------+---------------+---------+-----------+----------+--------------+     Summary: RIGHT: - There is no evidence of deep vein thrombosis in the lower extremity. However, portions of this examination were limited- see technologist comments above.  - No cystic structure found in the popliteal fossa.  LEFT: - There is no evidence of deep vein thrombosis in the lower extremity. However, portions of this examination were limited- see technologist comments above.  - No cystic structure found in the popliteal fossa.  *See table(s) above for measurements and observations.    Preliminary         Scheduled Meds: . aspirin  81 mg Oral QHS  . betamethasone acetate-betamethasone sodium phosphate  12 mg Intramuscular Q24 Hr x 2  . budesonide  0.25 mg Nebulization BID  . docusate sodium  100 mg Oral Daily  . furosemide  40 mg Intravenous BID  . heparin injection (subcutaneous)  10,000 Units Subcutaneous Q12H  . insulin aspart  0-12 Units  Subcutaneous TID PC  . insulin NPH Human  32 Units Subcutaneous QHS  . labetalol  100 mg Oral TID  . NIFEdipine  30 mg Oral QHS  . prenatal multivitamin  1 tablet Oral Q1200  . valACYclovir  1,000 mg Oral QHS  . [START ON 08/31/2020] Vitamin D (Ergocalciferol)  50,000 Units Oral Q7 days   Continuous Infusions:   LOS: 1 day    Time spent: 39 minutes spent on chart review, discussion with nursing staff, consultants, updating family and interview/physical exam; more than 50% of that time was spent in counseling and/or coordination of care.    Alvira Philips [place], DO [facility] Available via Epic secure chat 7am-7pm After these hours, please refer to coverage provider listed on [email or web address] 08/25/2020, 12:37 PM

## 2020-08-25 NOTE — Consult Note (Signed)
MFM Note  [name] is a 39 year old gravida 1 para 0 currently at 30 weeks and 4 days.  She was admitted yesterday due to worsening shortness of breath even at rest.  She reports that her shortness of breath started at about 22 weeks of her current pregnancy.  She was seen by pulmonology about a month and a half ago and was diagnosed with an asthma exacerbation.  She has been treated with Pulmicort and albuterol without any improvement in her shortness of breath symptoms.  She also had a sleep study recently and was diagnosed with sleep apnea.  She uses a CPAP machine at home.  Over the past few days, her shortness of breath symptoms have gotten worse.  She is short of breath even at rest.  She also reports a productive cough with sputum production when she lays down.  On admission, the patient was diagnosed with heart failure based on her symptoms and a maternal echocardiogram that showed a left ventricular ejection fraction of between 15% to 20% with severely decreased left ventricular function.  Cardiology believes that her heart failure is due to chronic systolic dysfunction which predates pregnancy and is not related to peripartum cardiomyopathy.  The patient reports that she was diagnosed with cardiomegaly based on a chest x-ray that was performed about 10 to 15 years ago.  She reports that she had an echocardiogram performed as part of the work-up for the cardiomegaly which was reported as normal.  She denies being symptomatic prior to pregnancy.  The patient had lower extremity Doppler studies performed at the time of admission which did not show any evidence of a DVT.  A CT angiogram was negative for pulmonary embolus.  The CT scan also showed pulmonary edema which was most likely related to a cardiogenic effect.  Her PIH labs at the time of admission were within normal limits.  Her TSH was within normal limits.  Her P/C ratio did not indicate significant proteinuria.  The patient's  pregnancy has been complicated by chronic hypertension which was treated with labetalol 100 mg 3 times a day and Procardia 30 mg XL daily.  She was also diagnosed with gestational diabetes earlier in her pregnancy which has been treated with Lantus insulin.  This is an IVF pregnancy.  The patient reports that she had a normal fetal echocardiogram performed with [facility] earlier in her pregnancy.  The patient had a fetal ultrasound performed in our office last week that showed an EFW of 1606 g (3 pounds 9 ounces, 43rd percentile for her gestational age).  The patient was seen by cardiology last night and was given IV Lasix and continued on labetalol for blood pressure control.  She was also given the first dose of betamethasone to enhance fetal lung maturity.    This morning, the patient reports that she is feeling much better.  Her shortness of breath has almost resolved.  She is able to ambulate to the bathroom without any symptoms.  The implications and management of heart failure in pregnancy was discussed in detail with the patient today.  As cardiology has reported that her heart failure is most likely due to a chronic condition and not related to her pregnancy, I do not believe that delivery is indicated at this time.  The patient understands that the goal of this hospitalization is to help relieve her symptoms and to improve her cardiac function with the appropriate medications.  For the management of heart failure in pregnancy, Lasix  may be given to reduce the preload.  A beta-blocker such as metoprolol may be used to decrease the workload on her heart. Hydralazine may be given to decrease the afterload.  Digoxin may also be given in pregnancy if necessary.  I discussed the use of these medications for the management of heart failure in pregnancy with the cardiology team.  They will place the patient on the appropriate dosages of these medications.  Hopefully, with the use of these  medications, her cardiac function will improve.  She should be placed on the appropriate medications for maternal treatment as she is already in the third trimester of pregnancy and there should be less concerns regarding the medication effects on the fetus.  The beta-blocker and hydralazine should also help treat her chronic hypertension.  We will perform a limited fetal ultrasound later this morning to verify that there is normal amniotic fluid around the fetus.  She should receive a daily fetal nonstress test while hospitalized.  To rule out preeclampsia, she should have a 24-hour urine collected to assess the total protein.  I believe that she probably does not have preeclampsia at this time.    To decrease her risk of a thromboembolic event while hospitalized, heparin 10,000 units twice a day should be given.  She should a complete course of betamethasone for fetal benefits.  We will continue to follow her closely with you during this hospitalization.  The patient understands that the duration of her hospitalization will depend on her symptoms and her cardiac status.    The goal for delivery would be at around 36 to 37 weeks.  An earlier delivery would be indicated should there be a deterioration of her cardiac status, should she develop superimposed preeclampsia, or at any time for nonreassuring fetal status.  The patient stated that all of her questions were answered to her complete satisfaction.    Thank you for referring this patient for a Maternal-Fetal Medicine consultation.  Recommendations: -Lasix, beta-blocker, and hydralazine to help improve her cardiac function as recommended by cardiology -Continue management of her heart failure with cardiology -Heparin 10,000 units twice a day for DVT prophylaxis -24-hour urine to assess for total protein -Daily fetal testing -Delivery at between 36 to 37 weeks -Delivery prior to 36 to 37 weeks would be indicated should there be a  deterioration of her cardiac status, should she develop superimposed preeclampsia, or should there be nonreassuring fetal status

## 2020-08-26 DIAGNOSIS — G4733 Obstructive sleep apnea (adult) (pediatric): Secondary | ICD-10-CM

## 2020-08-26 DIAGNOSIS — R0989 Other specified symptoms and signs involving the circulatory and respiratory systems: Secondary | ICD-10-CM

## 2020-08-26 LAB — BASIC METABOLIC PANEL
Anion gap: 11 (ref 5–15)
BUN: 14 mg/dL (ref 6–20)
CO2: 20 mmol/L — ABNORMAL LOW (ref 22–32)
Calcium: 8.8 mg/dL — ABNORMAL LOW (ref 8.9–10.3)
Chloride: 102 mmol/L (ref 98–111)
Creatinine, Ser: 1.01 mg/dL — ABNORMAL HIGH (ref 0.44–1.00)
GFR, Estimated: >60 mL/min (ref >60)
Glucose, Bld: 126 mg/dL — ABNORMAL HIGH (ref 70–99)
Potassium: 4.1 mmol/L (ref 3.5–5.1)
Sodium: 133 mmol/L — ABNORMAL LOW (ref 135–145)

## 2020-08-26 LAB — GLUCOSE, CAPILLARY
Glucose-Capillary: 133 mg/dL — ABNORMAL HIGH (ref 70–99)
Glucose-Capillary: 136 mg/dL — ABNORMAL HIGH (ref 70–99)
Glucose-Capillary: 126 mg/dL — ABNORMAL HIGH (ref 70–99)
Glucose-Capillary: 149 mg/dL — ABNORMAL HIGH (ref 70–99)
Glucose-Capillary: 124 mg/dL — ABNORMAL HIGH (ref 70–99)

## 2020-08-26 LAB — PROTEIN, URINE, 24 HOUR
Collection Interval-UPROT: 24 hours
Protein, 24H Urine: 96 mg/d (ref 50–100)
Protein, Urine: 8 mg/dL
Urine Total Volume-UPROT: 1200 mL

## 2020-08-26 LAB — HEMOGLOBIN A1C
Hgb A1c MFr Bld: 5.4 % (ref 4.8–5.6)
Mean Plasma Glucose: 108.28 mg/dL

## 2020-08-26 MED ORDER — INSULIN GLARGINE 100 UNIT/ML ~~LOC~~ SOLN
36.0000 [IU] | Freq: Every day | SUBCUTANEOUS | Status: DC
  Administered 2020-08-26: 36 [IU] via SUBCUTANEOUS
  Filled 2020-08-26 (×2): qty 0.36

## 2020-08-26 MED ORDER — ISOSORBIDE DINITRATE 10 MG PO TABS
10.0000 mg | ORAL_TABLET | Freq: Three times a day (TID) | ORAL | Status: DC
  Administered 2020-08-26 – 2020-09-01 (×17): 10 mg via ORAL
  Filled 2020-08-26 (×20): qty 1

## 2020-08-26 NOTE — Progress Notes (Addendum)
PROGRESS NOTE    ZAMYIAH TINO  ZOX:[id number] DOB: 10/05/1980 DOA: 08/24/2020 PCP: Donato Schultz, DO    Brief Narrative:  CEONNA FRAZZINI is a 39 year old female with past medical history significant for cardiomegaly, OSA on CPAP, asthma, prediabetes, morbid obesity; and currently 30 weeks IUP who presented to the ED with progressive shortness of breath and lower extremity edema.  During pregnancy, patient was diagnosed with gestational hypertension and started on labetalol and nifedipine.  Her shortness of breath and long-term edema has been progressing over the last 2 weeks up to the point she is unable to lie flat and is worsened with any type of exertion.  She has been utilizing her home albuterol without any significant effect in her symptoms.  Additionally recently diagnosed with OSA but having issues with her home CPAP machine.  Hospital service was consulted by [facility] service for assistance with shortness of breath in the antepartum setting.   Assessment & Plan:   Active Problems:   Morbid obesity (HCC)   Shortness of breath   Pulmonary vascular congestion   Acute on chronic diastolic congestive heart failure (HCC)   Edema   Acute combined systolic and diastolic heart failure, NYHA class 4 (HCC)   Essential hypertension   OSA (obstructive sleep apnea)   Cardiomyopathy Combined systolic and diastolic congestive heart failure, new diagnosis Patient presenting with progressive shortness of breath and lower extremity edema over the past 2 weeks.  Unable to lie flat and the symptoms worsened with exertion.  TTE with LVEF 15-20%, global hypokinesis, grade 2 diastolic dysfunction, RV function moderately reduced, LA severely dilated, RA mildly dilated, IVC dilated.  Bilateral duplex ultrasound lower extremities negative for DVT.  CT angiogram chest negative for PE but with pulmonary edema. --Cardiology/heart failure service following, appreciate assistance --net negative  past 24h with one unmeasured urine occurance and net negative 1.8L since admission --wt 133.2>131.1>131.4kg --net negative past 24h and net negative since admission --Furosemide 40 mg IV q12h --Continue monitor strict I's and O's and daily weights --Cardiology recommending cardiac MRI at a later date --May benefit from ARB/Entresto following delivery/lactation, will need repeat echo in 4 weeks  Essential hypertension --Metoprolol succinate 100 mg p.o. daily --Hydralazine 25 mg q8h --continue to monitor BP closely  Type 2 diabetes mellitus On Metformin and Lantus 32 units subcutaneously daily outpatient.  Hemoglobin A1c 5.4. --Hold Metformin while inpatient --Lantus 32 units subcutaneously qHS --Insulin sliding scale for further coverage --CBGs before every meal/at bedtime  Asthma --Continue albuterol as needed  OSA --Continue nocturnal CPAP  Morbid obesity Body mass index is 44.06 kg/m.  Discussed with patient needs for aggressive weight loss measures/lifestyle changes this complicates all facets of care.    DVT prophylaxis: Heparin Code Status: Full code Family Communication: Mother present at bedside  Disposition Plan: Per primary service, obstetrics   Procedures:   TTE 08/24/2020  Antimicrobials:   None   Subjective: Patient seen and examined bedside, resting comfortably lying in bed.  Sleeping but easily arousable.  Mother present at bedside.  States "feels the same as yesterday".  Able to tolerate ambulation to the bathroom without much dyspnea.  Reports not as much urine output overnight; continues with lower extremity edema.  Complaining that her TED hose are uncomfortable and causing pain to her knees.  No other complaints or concerns at this time.  Denies headache, no visual changes, no chest pain, no shortness of breath currently, no palpitations, no weakness, no fever/chills/night  sweats, no nausea/vomiting/diarrhea.  No acute events overnight  per nursing staff.  Objective: Vitals:   08/25/20 2332 08/26/20 0636 08/26/20 0637 08/26/20 0639  BP: 120/74 117/80 117/80   Pulse: (!) 103 96    Resp: 19 19    Temp: 97.7 F (36.5 C)     TempSrc: Oral     SpO2: 99%     Weight:    131.4 kg  Height:        Intake/Output Summary (Last 24 hours) at 08/26/2020 0912 Last data filed at 08/26/2020 0115 Gross per 24 hour  Intake 1600 ml  Output 2100 ml  Net -500 ml   Filed Weights   08/24/20 1152 08/25/20 0611 08/26/20 0639  Weight: 133.2 kg 131.1 kg 131.4 kg    Examination:  General exam: Appears calm and comfortable  Respiratory system: Clear to auscultation. Respiratory effort normal.  Oxygenating well on room air Cardiovascular system: S1 & S2 heard, RRR. No JVD, murmurs, rubs, gallops or clicks.  2+ pitting edema bilateral lower extremities to knee, TED hose noted Gastrointestinal system: Abdomen is nondistended, soft and nontender. No organomegaly or masses felt. Normal bowel sounds heard. Central nervous system: Alert and oriented. No focal neurological deficits. Extremities: Symmetric 5 x 5 power. Skin: No rashes, lesions or ulcers Psychiatry: Judgement and insight appear normal. Mood & affect appropriate.     Data Reviewed: I have personally reviewed following labs and imaging studies  CBC: Recent Labs  Lab 08/24/20 1242 08/25/20 0328  WBC 15.1* 13.3*  NEUTROABS  --  12.1*  HGB 11.4* 11.0*  HCT 35.9* 35.6*  MCV 74.9* 75.4*  PLT 360 336   Basic Metabolic Panel: Recent Labs  Lab 08/24/20 1242 08/25/20 0328 08/25/20 1445 08/26/20 0740  NA 135 134* 134* 133*  K 4.2 4.2 3.8 4.1  CL 104 105 101 102  CO2 20* 19* 22 20*  GLUCOSE 127* 107* 125* 126*  BUN 7 10 9 14   CREATININE 0.80 0.86 0.92 1.01*  CALCIUM 8.8* 9.0 8.8* 8.8*  MG  --  1.5*  --   --   PHOS  --  3.9  --   --    GFR: Estimated Creatinine Clearance: 107.3 mL/min (A) (by C-G formula based on SCr of 1.01 mg/dL (H)). Liver Function  Tests: Recent Labs  Lab 08/24/20 1242 08/25/20 0328  AST 29 27  ALT 32 33  ALKPHOS 203* 190*  BILITOT 1.2 1.6*  PROT 6.5 6.7  ALBUMIN 2.7* 2.7*   No results for input(s): LIPASE, AMYLASE in the last 168 hours. No results for input(s): AMMONIA in the last 168 hours. Coagulation Profile: No results for input(s): INR, PROTIME in the last 168 hours. Cardiac Enzymes: No results for input(s): CKTOTAL, CKMB, CKMBINDEX, TROPONINI in the last 168 hours. BNP (last 3 results) No results for input(s): PROBNP in the last 8760 hours. HbA1C: Recent Labs    08/26/20 0740  HGBA1C 5.4   CBG: Recent Labs  Lab 08/25/20 0611 08/25/20 0900 08/25/20 2327 08/26/20 0636  GLUCAP 118* 135* 115* 136*   Lipid Profile: No results for input(s): CHOL, HDL, LDLCALC, TRIG, CHOLHDL, LDLDIRECT in the last 72 hours. Thyroid Function Tests: Recent Labs    08/25/20 0328  TSH 1.986   Anemia Panel: No results for input(s): VITAMINB12, FOLATE, FERRITIN, TIBC, IRON, RETICCTPCT in the last 72 hours. Sepsis Labs: No results for input(s): PROCALCITON, LATICACIDVEN in the last 168 hours.  Recent Results (from the past 240 hour(s))  Resp Panel by  RT-PCR (Flu A&B, Covid) Nasopharyngeal Swab     Status: None   Collection Time: 08/24/20 12:48 PM   Specimen: Nasopharyngeal Swab; Nasopharyngeal(NP) swabs in vial transport medium  Result Value Ref Range Status   SARS Coronavirus 2 by RT PCR NEGATIVE NEGATIVE Final    Comment: (NOTE) SARS-CoV-2 target nucleic acids are NOT DETECTED.  The SARS-CoV-2 RNA is generally detectable in upper respiratory specimens during the acute phase of infection. The lowest concentration of SARS-CoV-2 viral copies this assay can detect is 138 copies/mL. A negative result does not preclude SARS-Cov-2 infection and should not be used as the sole basis for treatment or other patient management decisions. A negative result may occur with  improper specimen collection/handling,  submission of specimen other than nasopharyngeal swab, presence of viral mutation(s) within the areas targeted by this assay, and inadequate number of viral copies(<138 copies/mL). A negative result must be combined with clinical observations, patient history, and epidemiological information. The expected result is Negative.  Fact Sheet for Patients:  BloggerCourse.com  Fact Sheet for Healthcare Providers:  SeriousBroker.it  This test is no t yet approved or cleared by the [place] FDA and  has been authorized for detection and/or diagnosis of SARS-CoV-2 by FDA under an Emergency Use Authorization (EUA). This EUA will remain  in effect (meaning this test can be used) for the duration of the COVID-19 declaration under Section 564(b)(1) of the Act, 21 U.S.C.section 360bbb-3(b)(1), unless the authorization is terminated  or revoked sooner.       Influenza A by PCR NEGATIVE NEGATIVE Final   Influenza B by PCR NEGATIVE NEGATIVE Final    Comment: (NOTE) The Xpert Xpress SARS-CoV-2/FLU/RSV plus assay is intended as an aid in the diagnosis of influenza from Nasopharyngeal swab specimens and should not be used as a sole basis for treatment. Nasal washings and aspirates are unacceptable for Xpert Xpress SARS-CoV-2/FLU/RSV testing.  Fact Sheet for Patients: BloggerCourse.com  Fact Sheet for Healthcare Providers: SeriousBroker.it  This test is not yet approved or cleared by the [place] FDA and has been authorized for detection and/or diagnosis of SARS-CoV-2 by FDA under an Emergency Use Authorization (EUA). This EUA will remain in effect (meaning this test can be used) for the duration of the COVID-19 declaration under Section 564(b)(1) of the Act, 21 U.S.C. section 360bbb-3(b)(1), unless the authorization is terminated or revoked.  Performed at [facility], [address], [place], [place] [address]          Radiology Studies: DG Chest 1 View  Result Date: 08/25/2020 CLINICAL DATA:  CHF, pregnant, asthma EXAM: CHEST  1 VIEW COMPARISON:  Portable exam 0655 hours compared to 08/24/2020 FINDINGS: Enlargement of cardiac silhouette with pulmonary vascular congestion. Mediastinal contours normal. Slightly improved interstitial infiltrate, likely improved edema. No pleural effusion or pneumothorax. IMPRESSION: Slightly improved probable pulmonary edema. Electronically Signed   By: Ulyses Southward M.D.   On: 08/25/2020 08:27   CT Angio Chest PE W and/or Wo Contrast  Result Date: 08/24/2020 CLINICAL DATA:  39 year old female pregnant female in the 3rd trimester with progressive shortness of breath. EXAM: CT ANGIOGRAPHY CHEST WITH CONTRAST TECHNIQUE: Multidetector CT imaging of the chest was performed using the standard protocol during bolus administration of intravenous contrast. Multiplanar CT image reconstructions and MIPs were obtained to evaluate the vascular anatomy. CONTRAST:  75mL OMNIPAQUE IOHEXOL 350 MG/ML SOLN COMPARISON:  Portable chest radiograph earlier today. FINDINGS: Cardiovascular: Good contrast bolus timing in the pulmonary arterial tree. Mild respiratory  motion. No focal filling defect identified in the pulmonary arteries to suggest acute pulmonary embolism. Mild cardiomegaly with small volume pericardial effusion and pericardial fluid tracking into the superior mediastinum. No contrast in the aorta on this exam. Mediastinum/Nodes: Small volume of pericardial effusion suspected tracking into the superior pericardial recesses. No mediastinal mass or lymphadenopathy is evident. Lungs/Pleura: Layering right greater than left pleural effusions with simple fluid density suggesting transudate (series 6, image 57). Up to moderate volume effusion on the right. Partially sub pulmonic effusion on the left. Major airways are patent. Confluent bilateral pulmonary  ground-glass opacity with mosaic attenuation at the lung bases (series 8, image 78). There are some areas of more confluent peribronchial ground-glass opacity but no consolidation. Upper Abdomen: Trace contrast reflux into the hepatic IVC and hepatic veins. Negative visible spleen and stomach. Musculoskeletal: No acute osseous abnormality identified. Review of the MIP images confirms the above findings. IMPRESSION: 1. Negative for acute pulmonary embolus. 2. Constellation favoring cardiogenic pulmonary edema: - mild cardiomegaly with small pericardial effusion, - right greater than left layering pleural effusions, - diffuse pulmonary ground-glass opacity, perhaps with superimposed air trapping at the lung bases resulting in mosaic attenuation. A viral/atypical respiratory infection was considered but felt unlikely. Electronically Signed   By: Odessa Fleming M.D.   On: 08/24/2020 17:43   DG CHEST PORT 1 VIEW  Result Date: 08/24/2020 CLINICAL DATA:  Shortness of breath. EXAM: PORTABLE CHEST 1 VIEW COMPARISON:  09/24/2008 FINDINGS: The heart is mildly enlarged despite the AP projection and portable technique. There is central vascular congestion and slight increased interstitial markings which could suggest mild edema. No pleural effusions or focal infiltrates. The bony thorax is intact. IMPRESSION: Cardiac enlargement with vascular congestion and possible mild interstitial edema. No definite infiltrates or effusions. Electronically Signed   By: Rudie Meyer M.D.   On: 08/24/2020 12:48   ECHOCARDIOGRAM COMPLETE  Result Date: 08/24/2020    ECHOCARDIOGRAM REPORT   Patient Name:   SHACARA COZINE Date of Exam: 08/24/2020 Medical Rec #:  [id number]         Height:       68.0 in Accession #:    [id number]        Weight:       293.6 lb Date of Birth:  08/29/1981         BSA:          2.406 m Patient Age:    39 years          BP:           133/97 mmHg Patient Gender: F                 HR:           108 bpm. Exam Location:   Inpatient Procedure: 2D Echo, Cardiac Doppler, Color Doppler and Intracardiac            Opacification Agent Indications:    CHF-Acute diastolic  History:        Patient has prior history of Echocardiogram examinations, most                 recent 07/22/2009. CHF, Signs/Symptoms:Shortness of Breath; Risk                 Factors:Sleep Apnea.  Sonographer:    Ross Ludwig RDCS (AE) Referring Phys: [id number] [place] General  Sonographer Comments: Patient is morbidly obese. IMPRESSIONS  1. There is no left ventricular thrombus with Definity  contrast. Left ventricular ejection fraction, by estimation, is 15-20%. The left ventricle has severely decreased function. The left ventricle demonstrates global hypokinesis. The left ventricular internal cavity size was moderately dilated. Left ventricular diastolic parameters are consistent with Grade III diastolic dysfunction (restrictive). Elevated left atrial pressure.  2. Right ventricular systolic function is moderately reduced. The right ventricular size is normal. There is moderately elevated pulmonary artery systolic pressure. The estimated right ventricular systolic pressure is 45.0 mmHg.  3. Left atrial size was severely dilated.  4. Right atrial size was moderately dilated.  5. The pericardial effusion is circumferential.  6. The mitral valve is normal in structure. Mild to moderate mitral valve regurgitation.  7. The aortic valve is normal in structure. Aortic valve regurgitation is not visualized. No aortic stenosis is present.  8. The inferior vena cava is dilated in size with <50% respiratory variability, suggesting right atrial pressure of 15 mmHg. FINDINGS  Left Ventricle: There is no left ventricular thrombus with Definity contrast. Left ventricular ejection fraction, by estimation, is 15-20%. The left ventricle has severely decreased function. The left ventricle demonstrates global hypokinesis. Definity contrast agent was given IV to delineate the left ventricular  endocardial borders. The left ventricular internal cavity size was moderately dilated. There is no left ventricular hypertrophy. Left ventricular diastolic parameters are consistent with Grade  III diastolic dysfunction (restrictive). Elevated left atrial pressure. Right Ventricle: The right ventricular size is normal. No increase in right ventricular wall thickness. Right ventricular systolic function is moderately reduced. There is moderately elevated pulmonary artery systolic pressure. The tricuspid regurgitant velocity is 2.74 m/s, and with an assumed right atrial pressure of 15 mmHg, the estimated right ventricular systolic pressure is 45.0 mmHg. Left Atrium: Left atrial size was severely dilated. Right Atrium: Right atrial size was moderately dilated. Pericardium: Trivial pericardial effusion is present. The pericardial effusion is circumferential. Mitral Valve: The mitral valve is normal in structure. Mild to moderate mitral valve regurgitation, with centrally-directed jet. Tricuspid Valve: The tricuspid valve is normal in structure. Tricuspid valve regurgitation is mild. Aortic Valve: The aortic valve is normal in structure. Aortic valve regurgitation is not visualized. No aortic stenosis is present. Aortic valve mean gradient measures 2.0 mmHg. Aortic valve peak gradient measures 2.8 mmHg. Aortic valve area, by VTI measures 1.46 cm. Pulmonic Valve: The pulmonic valve was grossly normal. Pulmonic valve regurgitation is trivial. Aorta: The aortic root and ascending aorta are structurally normal, with no evidence of dilitation. Venous: The inferior vena cava is dilated in size with less than 50% respiratory variability, suggesting right atrial pressure of 15 mmHg. IAS/Shunts: No atrial level shunt detected by color flow Doppler.  LEFT VENTRICLE PLAX 2D LVIDd:         6.18 cm LVIDs:         5.56 cm LV PW:         1.10 cm LV IVS:        1.10 cm LVOT diam:     1.80 cm LV SV:         18 LV SV Index:   7 LVOT  Area:     2.54 cm  RIGHT VENTRICLE            IVC RV Basal diam:  3.40 cm    IVC diam: 2.70 cm RV S prime:     6.46 cm/s TAPSE (M-mode): 1.1 cm LEFT ATRIUM             Index  RIGHT ATRIUM           Index LA diam:        4.50 cm 1.87 cm/m  RA Area:     23.90 cm LA Vol (A2C):   91.6 ml 38.07 ml/m RA Volume:   72.40 ml  30.09 ml/m LA Vol (A4C):   97.6 ml 40.57 ml/m LA Biplane Vol: 99.4 ml 41.31 ml/m  AORTIC VALVE AV Area (Vmax):    1.53 cm AV Area (Vmean):   1.43 cm AV Area (VTI):     1.46 cm AV Vmax:           84.30 cm/s AV Vmean:          61.300 cm/s AV VTI:            0.122 m AV Peak Grad:      2.8 mmHg AV Mean Grad:      2.0 mmHg LVOT Vmax:         50.80 cm/s LVOT Vmean:        34.400 cm/s LVOT VTI:          0.070 m LVOT/AV VTI ratio: 0.57  AORTA Ao Root diam: 2.50 cm Ao Asc diam:  2.80 cm MR Peak grad:    72.6 mmHg   TRICUSPID VALVE MR Mean grad:    45.0 mmHg   TR Peak grad:   30.0 mmHg MR Vmax:         426.00 cm/s TR Vmax:        274.00 cm/s MR Vmean:        311.0 cm/s MR PISA:         1.01 cm    SHUNTS MR PISA Eff ROA: 9 mm       Systemic VTI:  0.07 m MR PISA Radius:  0.40 cm     Systemic Diam: 1.80 cm Rachelle Hora Croitoru MD Electronically signed by Thurmon Fair MD Signature Date/Time: 08/24/2020/6:09:06 PM    Final    VAS [place] LOWER EXTREMITY VENOUS (DVT)  Result Date: 08/25/2020  Lower Venous DVT Study Indications: Swelling, and Edema.  Limitations: Poor ultrasound/tissue interface and body habitus. Comparison Study: no prior Performing Technologist: Blanch Media RVS  Examination Guidelines: A complete evaluation includes B-mode imaging, spectral Doppler, color Doppler, and power Doppler as needed of all accessible portions of each vessel. Bilateral testing is considered an integral part of a complete examination. Limited examinations for reoccurring indications may be performed as noted. The reflux portion of the exam is performed with the patient in reverse Trendelenburg.   +---------+---------------+---------+-----------+----------+-------------------+ RIGHT    CompressibilityPhasicitySpontaneityPropertiesThrombus Aging      +---------+---------------+---------+-----------+----------+-------------------+ CFV      Full           Yes      Yes                                      +---------+---------------+---------+-----------+----------+-------------------+ SFJ      Full                                                             +---------+---------------+---------+-----------+----------+-------------------+ FV Prox  Full                                                             +---------+---------------+---------+-----------+----------+-------------------+  FV Mid                  Yes      Yes                                      +---------+---------------+---------+-----------+----------+-------------------+ FV Distal               Yes      Yes                                      +---------+---------------+---------+-----------+----------+-------------------+ PFV      Full                                                             +---------+---------------+---------+-----------+----------+-------------------+ POP      Full           Yes      Yes                                      +---------+---------------+---------+-----------+----------+-------------------+ PTV      Full                                                             +---------+---------------+---------+-----------+----------+-------------------+ PERO                                                  Not well visualized +---------+---------------+---------+-----------+----------+-------------------+   +---------+---------------+---------+-----------+----------+--------------+ LEFT     CompressibilityPhasicitySpontaneityPropertiesThrombus Aging +---------+---------------+---------+-----------+----------+--------------+ CFV      Full            Yes      Yes                                 +---------+---------------+---------+-----------+----------+--------------+ SFJ      Full                                                        +---------+---------------+---------+-----------+----------+--------------+ FV Prox  Full                                                        +---------+---------------+---------+-----------+----------+--------------+ FV Mid                  Yes      Yes                                 +---------+---------------+---------+-----------+----------+--------------+  FV Distal               Yes      Yes                                 +---------+---------------+---------+-----------+----------+--------------+ PFV      Full                                                        +---------+---------------+---------+-----------+----------+--------------+ POP      Full           Yes      Yes                                 +---------+---------------+---------+-----------+----------+--------------+ PTV      Full                                                        +---------+---------------+---------+-----------+----------+--------------+ PERO     Full                                                        +---------+---------------+---------+-----------+----------+--------------+     Summary: RIGHT: - There is no evidence of deep vein thrombosis in the lower extremity. However, portions of this examination were limited- see technologist comments above.  - No cystic structure found in the popliteal fossa.  LEFT: - There is no evidence of deep vein thrombosis in the lower extremity. However, portions of this examination were limited- see technologist comments above.  - No cystic structure found in the popliteal fossa.  *See table(s) above for measurements and observations. Electronically signed by Lemar Livings MD on 08/25/2020 at 3:13:22 PM.    Final    [place] MFM OB  LIMITED  Result Date: 08/25/2020 ----------------------------------------------------------------------  OBSTETRICS REPORT                       (Signed Final 08/25/2020 01:20 pm) ---------------------------------------------------------------------- Patient Info  ID #:       [id number]                          D.O.B.:  12-12-1980 (39 yrs)  Name:       ABIA MONACO Shewell               Visit Date: 08/25/2020 10:27 am ---------------------------------------------------------------------- Performed By  Attending:        Ma Rings MD         Ref. Address:     [facility]                                                             [facility]                                                             [address]                                                             [place], [place]                                                             [address]  Performed By:     Percell Boston          Secondary Phy.:   [facility]                    RDMS                                                             Care  Referred By:      Alphonsus Sias                Location[facility]         [facility]                    [facility] MD                              [facility] ---------------------------------------------------------------------- Orders  #  Description                           Code        Ordered By  1  [place] MFM OB LIMITED                     [id number]    CASSANDRA LAW ----------------------------------------------------------------------  #  Order #  Accession #                Episode #  1  [id number]                   [id number]                 [id number] ---------------------------------------------------------------------- Indications  Gestational diabetes in pregnancy, (on         O24.419  insulin)  Hypertension - Chronic/Pre-existing (on        O10.019  Labetolol)  [redacted]  weeks gestation of pregnancy                Z3A.31  Pregnancy resulting from assisted              O09.819  reproductive technology  Obesity complicating pregnancy, third          O99.212  trimester (BMI:40-45)  Asthma                                         O99.89 j46.909  Advanced maternal age primigravida 19+,        O69.512  third trimester  NIPS:LOw risk, AFP: Neg ---------------------------------------------------------------------- Fetal Evaluation  Num Of Fetuses:         1  Fetal Heart Rate(bpm):  143  Cardiac Activity:       Observed  Presentation:           Breech  Placenta:               Anterior  Amniotic Fluid  AFI FV:      Within normal limits  AFI Sum(cm)     %Tile       Largest Pocket(cm)  12.9            38          4.3  RUQ(cm)       RLQ(cm)       LUQ(cm)        LLQ(cm)  4.3           2.3           4.1            2.2 ---------------------------------------------------------------------- OB History  Gravidity:    1 ---------------------------------------------------------------------- Gestational Age  LMP:           31w 2d        Date:  01/19/20                 EDD:   10/25/20  Best:          31w 2d     Det. By:  LMP  (01/19/20)          EDD:   10/25/20 ---------------------------------------------------------------------- Anatomy  Thoracic:              Appears normal         Bladder:                Appears normal  Stomach:               Appears normal, left                         sided ---------------------------------------------------------------------- Cervix Uterus Adnexa  Cervix  Not visualized (advanced GA >24wks)  Uterus  No abnormality visualized.  Right Ovary  No adnexal mass visualized.  Left Ovary  No adnexal mass visualized.  Cul De Sac  No free fluid seen.  Adnexa  No abnormality visualized. ---------------------------------------------------------------------- Comments  This patient has been hospitalized due to maternal heart  failure with a systolic ejection fraction of between 15  to 20%.  A limited ultrasound performed today shows normal amniotic  fluid.  The fetus is in the breech presentation today.  Fetal  breathing and fetal body movements were noted throughout  today's ultrasound exam.  The patient should continue to be treated with the appropriate  medications for the treatment of heart failure. ----------------------------------------------------------------------                   Ma Rings, MD Electronically Signed Final Report   08/25/2020 01:20 pm ----------------------------------------------------------------------       Scheduled Meds: . aspirin  81 mg Oral QHS  . budesonide  0.25 mg Nebulization BID  . docusate sodium  100 mg Oral Daily  . furosemide  40 mg Intravenous BID  . heparin injection (subcutaneous)  10,000 Units Subcutaneous Q12H  . hydrALAZINE  25 mg Oral Q8H  . insulin aspart  0-12 Units Subcutaneous TID PC  . insulin glargine  32 Units Subcutaneous QHS  . metoprolol succinate  100 mg Oral Daily  . prenatal multivitamin  1 tablet Oral Q1200  . valACYclovir  1,000 mg Oral QHS  . [START ON 08/31/2020] Vitamin D (Ergocalciferol)  50,000 Units Oral Q7 days   Continuous Infusions:   LOS: 2 days    Time spent: 36 minutes spent on chart review, discussion with nursing staff, consultants, updating family and interview/physical exam; more than 50% of that time was spent in counseling and/or coordination of care.    Alvira Philips [place], DO [facility] Available via Epic secure chat 7am-7pm After these hours, please refer to coverage provider listed on [email or web address] 08/26/2020, 9:12 AM

## 2020-08-26 NOTE — Progress Notes (Signed)
Inpatient Diabetes Program Recommendations  Diabetes Treatment Program Recommendations  ADA Standards of Care 2018 Diabetes in Pregnancy Target Glucose Ranges:  Fasting: 60 - 90 mg/dL Preprandial: 60 - 179 mg/dL 1 hr postprandial: Less than 140mg /dL (from first bite of meal) 2 hr postprandial: Less than 120 mg/dL (from first bite of meal)    Lab Results  Component Value Date   GLUCAP 136 (H) 08/26/2020   HGBA1C 5.4 08/26/2020    Review of Glycemic Control Results for EULALAH, RUPERT (MRN Dorothy Puffer) as of 08/26/2020 09:02  Ref. Range 08/25/2020 09:00 08/25/2020 23:27 08/26/2020 06:36  Glucose-Capillary Latest Ref Range: 70 - 99 mg/dL 14/05/2020 (H) 801 (H) 655 (H)    Diabetes history: GDM Outpatient Diabetes medications: Lantus 32 units QHS Current orders for Inpatient glycemic control: Lantus 32 units QHS, Novolog 0-12 units TID BMZ x2 (of 2)  Inpatient Diabetes Program Recommendations:    Trends to be mildly elevated today related to steroids, anticipate improvement over next 24 hours. Irregardless, patient explains FSBG have recently been 100-110 mg/dL at home. Would go ahead and consider increasing Lantus to 36 units QHS.   Thanks, 374, MSN, RNC-OB Diabetes Coordinator [phone] (8a-5p)

## 2020-08-26 NOTE — Progress Notes (Signed)
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, stressed about current time off she wasn't planning for-worried about later after baby born, ect; pt confirms fasting bs prior to hospital admission were 100-110 and have been since about 28wk or so  denies ctx/vb/lof; +FM  Patient Vitals for the past 24 hrs:  BP Temp Temp src Pulse Resp SpO2 Weight  08/26/20 0950 121/68 98.1 F (36.7 C) Oral (!) 110 19 97 % --  08/26/20 0639 -- -- -- -- -- -- 131.4 kg  08/26/20 0637 117/80 -- -- -- -- -- --  08/26/20 0636 117/80 -- -- 96 19 -- --  08/25/20 2332 120/74 97.7 F (36.5 C) Oral (!) 103 19 99 % --  08/25/20 1958 115/76 97.7 F (36.5 C) Oral (!) 107 19 99 % --  08/25/20 1451 114/73 98.1 F (36.7 C) Oral (!) 105 19 97 % --  08/25/20 1136 117/78 98.1 F (36.7 C) Oral (!) 111 -- 100 % --    Intake/Output Summary (Last 24 hours) at 08/26/2020 1111 Last data filed at 08/26/2020 0115 Gross per 24 hour  Intake 1600 ml  Output 2100 ml  Net -500 ml   A&ox3 rrr ctab Abd: soft,nt, gravid LE: 2+ edema, compression socking on, scds not on (states hasn't been using)   Results for orders placed or performed during the hospital encounter of 08/24/20 (from the past 24 hour(s))  Basic metabolic panel     Status: Abnormal   Collection Time: 08/25/20  2:45 PM  Result Value Ref Range   Sodium 134 (L) 135 - 145 mmol/L   Potassium 3.8 3.5 - 5.1 mmol/L   Chloride 101 98 - 111 mmol/L   CO2 22 22 - 32 mmol/L   Glucose, Bld 125 (H) 70 - 99 mg/dL   BUN 9 6 - 20 mg/dL   Creatinine, Ser 6.65 0.44 - 1.00 mg/dL   Calcium 8.8 (L) 8.9 - 10.3 mg/dL   GFR, Estimated >99 >35 mL/min   Anion gap 11 5 - 15  Glucose, capillary     Status: Abnormal   Collection Time: 08/25/20 11:27 PM  Result Value Ref Range    Glucose-Capillary 115 (H) 70 - 99 mg/dL  Glucose, capillary     Status: Abnormal   Collection Time: 08/26/20  6:36 AM  Result Value Ref Range   Glucose-Capillary 136 (H) 70 - 99 mg/dL  Basic metabolic panel     Status: Abnormal   Collection Time: 08/26/20  7:40 AM  Result Value Ref Range   Sodium 133 (L) 135 - 145 mmol/L   Potassium 4.1 3.5 - 5.1 mmol/L   Chloride 102 98 - 111 mmol/L   CO2 20 (L) 22 - 32 mmol/L   Glucose, Bld 126 (H) 70 - 99 mg/dL   BUN 14 6 - 20 mg/dL   Creatinine, Ser 7.01 (H) 0.44 - 1.00 mg/dL   Calcium 8.8 (L) 8.9 - 10.3 mg/dL   GFR, Estimated >77 >93 mL/min   Anion gap 11 5 - 15  Hemoglobin A1c     Status: None   Collection Time: 08/26/20  7:40 AM  Result Value Ref Range   Hgb A1c MFr Bld 5.4 4.8 - 5.6 %   Mean Plasma Glucose 108.28 mg/dL   FHT: 903E,  nml variability, +accels, no decels TOCO: no contractions  U/s 12/8: breech, afi 12.9cm Efw: last wk 1606g (3'9"), 43% (with MFM)  A/P: 39 y/o G1 at 30.5wga 1. Acute heart failure - followed by cardiology/heart failure team, per discussion with Tonye Becket, NP: plan for continued diuresis and hoping for improvement of LE edema, pt is w/o SOB since yesterday am, plan for continuing hydralazine, adding isordil, holding on beginning digoxin, contin low salt diet; aware of increase in creatinine and will monitor 2. CHTN - less likely pre-eclampsia but 24 hr urine to r/o - due to complete 1pm today, bps wnl and will follow closely 3. DMI - elevated bs sine steroids (as expected) but patient reporting elevated fasting bs c/w initial fasting here, agree with diabetic coordinator and will increase lantus to 36 units q hs and contin ssi; contin to follow closely 4. Fetal status: reassuring, contin tid nst, aga last wk, nml afi yesterday; breech 5. Prematurity - betamethasone course completed last night; no current plan for deliver as pt stable and fetal status reassuring 6. OSA - cpap 7. Asthma - stable, albuterol prn 8.  dvt prophylaxis: contin heparing 10,000U bid; encourage again scd use, pt states "I need to start using them" 9. Morbid obesity 10. H/o hsv- continue valtrex daily supression 11. IVF pregnancy - nml fetal echo  I have discussed management with heart failure team.  Subsequent lengthy conversation with patient and her mother addressing patient's concerns re: discharge, working remotely, current health status and fetal status, management planning, expectations for current hospitalization, social/financial concerns and encourage pt to direct to social worker for assistance, encourage pt to find things to occupy time other than working and suggestions given; after discussion there were no further questions by mother or pt, mother felt that conversation was helpful and informative.  1 hr spent in chart review/exam/counseling/medical decision making

## 2020-08-26 NOTE — Progress Notes (Signed)
CSW met with patient at bedside to discuss patient's concerns. CSW introduced self and explained reason for visit. Patient invited CSW in and was agreeable to speaking with CSW with support present. Patient shared concerns about employment, FMLA and spending time with infant after delivery. CSW actively listened and provided emotional support. Patient's mother participated in conversation and provided patient with support and helpful suggestions. CSW spoke with patient about managing stress and being intentional about decreasing stress levels for health of patient and baby. Patient was receptive to conversation and suggestions. CSW agreed to check in with patient often, patient agreeable. CSW provided encouraging words and encouraged patient to make her health and infant's health her main priority. CSW inquired about anything CSW could do to be helpful to assist patient, patient reported that there was nothing needed from [place]. CSW asked if patient was interested in chaplin support, patient replied no. Patient thanked CSW for visit and support.   Abundio Miu, [place] Worker [facility] Cell#: [phone]

## 2020-08-26 NOTE — Progress Notes (Addendum)
Advanced Heart Failure Rounding Note  PCP-Cardiologist: Chilton Si, MD   Subjective:    Yesterday diuresed with IV lasix. HF meds adjusted.   Feels ok. Says she is bored. Wants to try and work .    Objective:   Weight Range: 131.4 kg Body mass index is 44.06 kg/m.   Vital Signs:   Temp:  [97.7 F (36.5 C)-98.1 F (36.7 C)] 98.1 F (36.7 C) (12/09 0950) Pulse Rate:  [96-111] 110 (12/09 0950) Resp:  [19] 19 (12/09 0950) BP: (114-121)/(68-80) 121/68 (12/09 0950) SpO2:  [97 %-100 %] 97 % (12/09 0950) Weight:  [131.4 kg] 131.4 kg (12/09 0639) Last BM Date: 08/24/20  Weight change: Filed Weights   08/24/20 1152 08/25/20 0611 08/26/20 0639  Weight: 133.2 kg 131.1 kg 131.4 kg    Intake/Output:   Intake/Output Summary (Last 24 hours) at 08/26/2020 1016 Last data filed at 08/26/2020 0115 Gross per 24 hour  Intake 1600 ml  Output 2100 ml  Net -500 ml      Physical Exam    General:  Well appearing. No resp difficulty HEENT: Normal Neck: Supple. JVP 8-9 . Carotids 2+ bilat; no bruits. No lymphadenopathy or thyromegaly appreciated. Cor: PMI nondisplaced. Regular rate & rhythm. No rubs, gallops or murmurs. Lungs: Clear Abdomen: Soft, nontender, nondistended. No hepatosplenomegaly. No bruits or masses. Good bowel sounds. Extremities: No cyanosis, clubbing, rash, R and LLE 2+ edema with compression stockings.  Neuro: Alert & orientedx3, cranial nerves grossly intact. moves all 4 extremities w/o difficulty. Affect pleasant   Telemetry    SR-ST 80-100s   EKG    n/a  Labs    CBC Recent Labs    08/24/20 1242 08/25/20 0328  WBC 15.1* 13.3*  NEUTROABS  --  12.1*  HGB 11.4* 11.0*  HCT 35.9* 35.6*  MCV 74.9* 75.4*  PLT 360 336   Basic Metabolic Panel Recent Labs    17[birth year]/39 0328 08/25/20 1445 08/26/20 0740  NA 134* 134* 133*  K 4.2 3.8 4.1  CL 105 101 102  CO2 19* 22 20*  GLUCOSE 107* 125* 126*  BUN 10 9 14   CREATININE 0.86 0.92 1.01*   CALCIUM 9.0 8.8* 8.8*  MG 1.5*  --   --   PHOS 3.9  --   --    Liver Function Tests Recent Labs    08/24/20 1242 08/25/20 0328  AST 29 27  ALT 32 33  ALKPHOS 203* 190*  BILITOT 1.2 1.6*  PROT 6.5 6.7  ALBUMIN 2.7* 2.7*   No results for input(s): LIPASE, AMYLASE in the last 72 hours. Cardiac Enzymes No results for input(s): CKTOTAL, CKMB, CKMBINDEX, TROPONINI in the last 72 hours.  BNP: BNP (last 3 results) Recent Labs    08/24/20 1242  BNP 241.0*    ProBNP (last 3 results) No results for input(s): PROBNP in the last 8760 hours.   D-Dimer Recent Labs    08/24/20 1242  DDIMER 1.80*   Hemoglobin A1C Recent Labs    08/26/20 0740  HGBA1C 5.4   Fasting Lipid Panel No results for input(s): CHOL, HDL, LDLCALC, TRIG, CHOLHDL, LDLDIRECT in the last 72 hours. Thyroid Function Tests Recent Labs    08/25/20 0328  TSH 1.986    Other results:   Imaging    14/08/21 MFM OB LIMITED  Result Date: 08/25/2020 ----------------------------------------------------------------------  OBSTETRICS REPORT                       (Signed  Final 08/25/2020 01:20 pm) ---------------------------------------------------------------------- Patient Info  ID #:       [id number]                          D.O.B.:  07/13/1981 (39 yrs)  Name:       [name] Jeffrey               Visit Date: 08/25/2020 10:27 am ---------------------------------------------------------------------- Performed By  Attending:        Ma Rings MD         Ref. Address:     [facility]                                                             OB/GYN &                                                             Infertility                                                             [address]                                                             [place], [place]                                                             [id number]  Performed By:     Percell Boston           Secondary Phy.:   [facility]                    [facility]                                                             Care  Referred By:      Alphonsus Sias  Location:         [facility]                    [facility] MD                              [facility] ---------------------------------------------------------------------- Orders  #  Description                           Code        Ordered By  1  [place] MFM OB LIMITED                     [id number]    CASSANDRA LAW ----------------------------------------------------------------------  #  Order #                     Accession #                Episode #  1  [id number]                   [id number]                 [id number] ---------------------------------------------------------------------- Indications  Gestational diabetes in pregnancy, (on         O24.419  insulin)  Hypertension - Chronic/Pre-existing (on        O10.019  Labetolol)  [redacted] weeks gestation of pregnancy                Z3A.31  Pregnancy resulting from assisted              O09.819  reproductive technology  Obesity complicating pregnancy, third          O99.212  trimester (BMI:40-45)  Asthma                                         O99.89 j98.909  Advanced maternal age primigravida 43+,        O73.512  third trimester  NIPS:LOw risk, AFP: Neg ---------------------------------------------------------------------- Fetal Evaluation  Num Of Fetuses:         1  Fetal Heart Rate(bpm):  143  Cardiac Activity:       Observed  Presentation:           Breech  Placenta:               Anterior  Amniotic Fluid  AFI FV:      Within normal limits  AFI Sum(cm)     %Tile       Largest Pocket(cm)  12.9            38          4.3  RUQ(cm)       RLQ(cm)       LUQ(cm)        LLQ(cm)  4.3           2.3           4.1            2.2 ---------------------------------------------------------------------- OB History  Gravidity:    1 ----------------------------------------------------------------------  Gestational Age  LMP:           31w 2d        Date:  01/19/20  EDD:   10/25/20  Best:          31w 2d     Det. By:  LMP  (01/19/20)          EDD:   10/25/20 ---------------------------------------------------------------------- Anatomy  Thoracic:              Appears normal         Bladder:                Appears normal  Stomach:               Appears normal, left                         sided ---------------------------------------------------------------------- Cervix Uterus Adnexa  Cervix  Not visualized (advanced GA >24wks)  Uterus  No abnormality visualized.  Right Ovary  No adnexal mass visualized.  Left Ovary  No adnexal mass visualized.  Cul De Sac  No free fluid seen.  Adnexa  No abnormality visualized. ---------------------------------------------------------------------- Comments  This patient has been hospitalized due to maternal heart  failure with a systolic ejection fraction of between 15 to 20%.  A limited ultrasound performed today shows normal amniotic  fluid.  The fetus is in the breech presentation today.  Fetal  breathing and fetal body movements were noted throughout  today's ultrasound exam.  The patient should continue to be treated with the appropriate  medications for the treatment of heart failure. ----------------------------------------------------------------------                   Ma Rings, MD Electronically Signed Final Report   08/25/2020 01:20 pm ----------------------------------------------------------------------     Medications:     Scheduled Medications: . aspirin  81 mg Oral QHS  . budesonide  0.25 mg Nebulization BID  . docusate sodium  100 mg Oral Daily  . furosemide  40 mg Intravenous BID  . heparin injection (subcutaneous)  10,000 Units Subcutaneous Q12H  . hydrALAZINE  25 mg Oral Q8H  . insulin aspart  0-12 Units Subcutaneous TID PC  . insulin glargine  32 Units Subcutaneous QHS  . isosorbide dinitrate  10 mg Oral TID  . metoprolol  succinate  100 mg Oral Daily  . prenatal multivitamin  1 tablet Oral Q1200  . valACYclovir  1,000 mg Oral QHS  . [START ON 08/31/2020] Vitamin D (Ergocalciferol)  50,000 Units Oral Q7 days     Infusions:   PRN Medications:  acetaminophen, albuterol, calcium carbonate, zolpidem    Assessment/Plan   1. Acute Biventricular Heart Failure  -Echo this admit severely reduced EF 15-20% . Repeat ECHO after she delivers.  - Volume status a little better. Continue IV lasix today. - Continue current dose of bb + hydralazine.  - Add isordil 10 mg tid.  - Hold off on digoxin.  - Avoid ari and spiro with pregnancy - Low salt diet. Strict I&O.   2. HTN possible preeclampsia  Controlled.   3. DMI  4. OSA Recently diagnosed   5. H/O Asthma.   Discussed with primary team. Continue diurese today.   Length of Stay: 2  Tonye Becket, NP  08/26/2020, 10:16 AM  Advanced Heart Failure Team Pager [phone] (M-F; 7a - 4p)  Please contact [facility] for night-coverage after hours (4p -7a ) and weekends on [email or web address]  Patient seen and examined with the above-signed Advanced Practice Provider and/or Housestaff. I personally reviewed laboratory data, imaging studies and relevant notes. I  independently examined the patient and formulated the important aspects of the plan. I have edited the note to reflect any of my changes or salient points. I have personally discussed the plan with the patient and/or family.  Doing well. Diuresing. Fetal NSTs stable. Denies SOB, orthopnea or PND. Wants to go back to work.  General:  Lying in bed  No resp difficulty HEENT: normal Neck: supple. JVP mildly elevated Carotids 2+ bilat; no bruits. No lymphadenopathy or thryomegaly appreciated. Cor: PMI nondisplaced. Regular rate & rhythm. No rubs, gallops or murmurs. Lungs: clear Abdomen:  Gravid obese soft, nontender, nondistended. No hepatosplenomegaly. No bruits or masses. Good bowel sounds. Extremities: no  cyanosis, clubbing, rash, 2+ edema Neuro: alert & orientedx3, cranial nerves grossly intact. moves all 4 extremities w/o difficulty. Affect pleasant  HTN improved. Still volume overloaded. Continue IV diuresis for 1-2 more days. Add isordil.   Discussed need to avoid work until after pregnancy. Also discussed need to avoid future pregnancies.   Otherwise plan as outlined above.   Arvilla Meres, MD  6:16 PM

## 2020-08-27 ENCOUNTER — Inpatient Hospital Stay: Payer: Self-pay

## 2020-08-27 DIAGNOSIS — I5033 Acute on chronic diastolic (congestive) heart failure: Secondary | ICD-10-CM

## 2020-08-27 LAB — BASIC METABOLIC PANEL
Anion gap: 11 (ref 5–15)
BUN: 20 mg/dL (ref 6–20)
CO2: 21 mmol/L — ABNORMAL LOW (ref 22–32)
Calcium: 8.7 mg/dL — ABNORMAL LOW (ref 8.9–10.3)
Chloride: 103 mmol/L (ref 98–111)
Creatinine, Ser: 1.13 mg/dL — ABNORMAL HIGH (ref 0.44–1.00)
GFR, Estimated: >60 mL/min (ref >60)
Glucose, Bld: 120 mg/dL — ABNORMAL HIGH (ref 70–99)
Potassium: 3.9 mmol/L (ref 3.5–5.1)
Sodium: 135 mmol/L (ref 135–145)

## 2020-08-27 LAB — COOXEMETRY PANEL
Carboxyhemoglobin: 0.9 % (ref 0.5–1.5)
Carboxyhemoglobin: 1.4 % (ref 0.5–1.5)
Carboxyhemoglobin: 1.6 % — ABNORMAL HIGH (ref 0.5–1.5)
Methemoglobin: 0.8 % (ref 0.0–1.5)
Methemoglobin: 0.6 % (ref 0.0–1.5)
Methemoglobin: 0.7 % (ref 0.0–1.5)
O2 Saturation: 40.9 %
O2 Saturation: 72.1 %
O2 Saturation: 96.3 %
Total hemoglobin: 10.8 g/dL — ABNORMAL LOW (ref 12.0–16.0)
Total hemoglobin: 10.9 g/dL — ABNORMAL LOW (ref 12.0–16.0)
Total hemoglobin: 11.5 g/dL — ABNORMAL LOW (ref 12.0–16.0)

## 2020-08-27 LAB — GLUCOSE, CAPILLARY
Glucose-Capillary: 111 mg/dL — ABNORMAL HIGH (ref 70–99)
Glucose-Capillary: 112 mg/dL — ABNORMAL HIGH (ref 70–99)
Glucose-Capillary: 137 mg/dL — ABNORMAL HIGH (ref 70–99)

## 2020-08-27 MED ORDER — SODIUM CHLORIDE 0.9% FLUSH
10.0000 mL | Freq: Two times a day (BID) | INTRAVENOUS | Status: DC
  Administered 2020-08-27 – 2020-09-01 (×7): 10 mL

## 2020-08-27 MED ORDER — CHLORHEXIDINE GLUCONATE CLOTH 2 % EX PADS
6.0000 | MEDICATED_PAD | Freq: Every day | CUTANEOUS | Status: DC
  Administered 2020-08-27 – 2020-09-01 (×6): 6 via TOPICAL

## 2020-08-27 MED ORDER — POTASSIUM CHLORIDE CRYS ER 20 MEQ PO TBCR
40.0000 meq | EXTENDED_RELEASE_TABLET | Freq: Once | ORAL | Status: AC
  Administered 2020-08-27: 40 meq via ORAL
  Filled 2020-08-27: qty 2

## 2020-08-27 MED ORDER — INSULIN GLARGINE 100 UNIT/ML ~~LOC~~ SOLN
18.0000 [IU] | Freq: Every day | SUBCUTANEOUS | Status: DC
  Administered 2020-08-27: 18 [IU] via SUBCUTANEOUS
  Filled 2020-08-27 (×2): qty 0.18

## 2020-08-27 MED ORDER — DIGOXIN 125 MCG PO TABS
0.1250 mg | ORAL_TABLET | Freq: Every day | ORAL | Status: DC
  Administered 2020-08-27 – 2020-09-01 (×5): 0.125 mg via ORAL
  Filled 2020-08-27 (×6): qty 1

## 2020-08-27 MED ORDER — MILRINONE LACTATE IN DEXTROSE 20-5 MG/100ML-% IV SOLN
0.1250 ug/kg/min | INTRAVENOUS | Status: DC
  Administered 2020-08-27 – 2020-08-29 (×3): 0.25 ug/kg/min via INTRAVENOUS
  Administered 2020-08-29 – 2020-08-30 (×2): 0.125 ug/kg/min via INTRAVENOUS
  Filled 2020-08-27 (×6): qty 100

## 2020-08-27 MED ORDER — HYDRALAZINE HCL 20 MG/ML IJ SOLN
10.0000 mg | Freq: Once | INTRAMUSCULAR | Status: AC
  Administered 2020-08-27: 10 mg via INTRAVENOUS
  Filled 2020-08-27: qty 1

## 2020-08-27 MED ORDER — SODIUM CHLORIDE 0.9% FLUSH: 10.0000 mL | INTRAVENOUS | Status: DC | PRN

## 2020-08-27 MED ORDER — HYDRALAZINE HCL 50 MG PO TABS
50.0000 mg | ORAL_TABLET | Freq: Three times a day (TID) | ORAL | Status: DC
  Administered 2020-08-27 – 2020-09-01 (×14): 50 mg via ORAL
  Filled 2020-08-27 (×13): qty 1

## 2020-08-27 MED ORDER — HYDRALAZINE HCL 20 MG/ML IJ SOLN
10.0000 mg | INTRAMUSCULAR | Status: DC | PRN
  Administered 2020-08-27: 10 mg via INTRAVENOUS
  Filled 2020-08-27 (×2): qty 1

## 2020-08-27 MED ORDER — METOPROLOL SUCCINATE ER 50 MG PO TB24: 50.0000 mg | ORAL_TABLET | Freq: Every day | ORAL | Status: DC

## 2020-08-27 MED ORDER — FUROSEMIDE 10 MG/ML IJ SOLN
80.0000 mg | Freq: Once | INTRAMUSCULAR | Status: AC
  Administered 2020-08-27: 80 mg via INTRAVENOUS
  Filled 2020-08-27: qty 8

## 2020-08-27 NOTE — Progress Notes (Signed)
PROGRESS NOTE    [name]  ZOX:[id number] DOB: 12-20-1980 DOA: 08/24/2020 PCP: Donato Schultz, DO    Brief Narrative:  [name] is a 39 year old female with past medical history significant for cardiomegaly, OSA on CPAP, asthma, prediabetes, morbid obesity; and currently 30 weeks IUP who presented to the ED with progressive shortness of breath and lower extremity edema.  During pregnancy, patient was diagnosed with gestational hypertension and started on labetalol and nifedipine.  Her shortness of breath and long-term edema has been progressing over the last 2 weeks up to the point she is unable to lie flat and is worsened with any type of exertion.  She has been utilizing her home albuterol without any significant effect in her symptoms.  Additionally recently diagnosed with OSA but having issues with her home CPAP machine.  Hospital service was consulted by [facility] service for assistance with shortness of breath in the antepartum setting.   Assessment & Plan:   Active Problems:   Morbid obesity (HCC)   Shortness of breath   Pulmonary vascular congestion   Acute on chronic diastolic congestive heart failure (HCC)   Edema   Acute combined systolic and diastolic heart failure, NYHA class 4 (HCC)   Essential hypertension   OSA (obstructive sleep apnea)   Cardiomyopathy Combined systolic and diastolic congestive heart failure, new diagnosis Patient presenting with progressive shortness of breath and lower extremity edema over the past 2 weeks.  Unable to lie flat and the symptoms worsened with exertion.  TTE with LVEF 15-20%, global hypokinesis, grade 2 diastolic dysfunction, RV function moderately reduced, LA severely dilated, RA mildly dilated, IVC dilated.  Bilateral duplex ultrasound lower extremities negative for DVT.  CT angiogram chest negative for PE but with pulmonary edema. --Cardiology/heart failure service following, appreciate assistance --net negative  past 24h with one unmeasured urine occurance and net negative 1.8L since admission --wt 133.2>131.1>131.4>131.9kg --net negative 1.4L past 24h and net negative 2.4L since admission --Furosemide 40 mg IV q12h --Continue monitor strict I's and O's and daily weights --Cardiology recommending cardiac MRI at a later date --May benefit from ARB/Entresto following delivery/lactation, will need repeat echo in 4 weeks  Essential hypertension --Metoprolol succinate 100 mg p.o. daily --Hydralazine 25 mg q8h --continue to monitor BP closely  Type 2 diabetes mellitus On Metformin and Lantus 32 units subcutaneously daily outpatient.  Hemoglobin A1c 5.4. --Hold Metformin while inpatient --Lantus 36 units subcutaneously qHS --Insulin sliding scale for further coverage --CBGs before every meal/at bedtime  Asthma --Continue albuterol as needed  OSA --Continue nocturnal CPAP  Morbid obesity Body mass index is 44.21 kg/m.  Discussed with patient needs for aggressive weight loss measures/lifestyle changes this complicates all facets of care.    DVT prophylaxis: Heparin Code Status: Full code Family Communication: Mother present at bedside  Disposition Plan: Per primary service, obstetrics   Procedures:   TTE 08/24/2020  Antimicrobials:   None   Subjective: Patient seen and examined bedside, resting comfortably lying in bed.  Continues to report dyspnea with exertion.  "I do not feel like I am getting any better".  Continues with lower extremity edema, unchanged.  Discussed with her extensively at bedside, that her symptoms are related to her significant cardiomyopathy with low EF; and the hope is that her heart function will regain function on pregnancy.  No other complaints or concerns at this time.  Denies headache, no visual changes, no chest pain, no palpitations, no weakness, no fever/chills/night sweats, no nausea/vomiting/diarrhea.  No acute events overnight per nursing  staff.  Objective: Vitals:   08/26/20 2320 08/27/20 0608 08/27/20 0700 08/27/20 0730  BP: 118/72 117/76  120/66  Pulse: (!) 109 (!) 107  96  Resp: 18 18  18   Temp: (!) 97.5 F (36.4 C)  97.6 F (36.4 C) 97.6 F (36.4 C)  TempSrc: Oral  Oral Oral  SpO2: 98%   96%  Weight:  131.9 kg    Height:        Intake/Output Summary (Last 24 hours) at 08/27/2020 1040 Last data filed at 08/27/2020 0615 Gross per 24 hour  Intake 960 ml  Output 2700 ml  Net -1740 ml   Filed Weights   08/25/20 0611 08/26/20 0639 08/27/20 14/10/21  Weight: 131.1 kg 131.4 kg 131.9 kg    Examination:  General exam: Appears calm and comfortable  Respiratory system: Clear to auscultation. Respiratory effort normal.  Oxygenating well on room air Cardiovascular system: S1 & S2 heard, RRR. No JVD, murmurs, rubs, gallops or clicks.  2+ pitting edema bilateral lower extremities to knee, TED hose noted on bilateral lower extremities Gastrointestinal system: Abdomen is nondistended, soft and nontender. No organomegaly or masses felt. Normal bowel sounds heard. Central nervous system: Alert and oriented. No focal neurological deficits. Extremities: Symmetric 5 x 5 power. Skin: No rashes, lesions or ulcers Psychiatry: Judgement and insight appear normal. Mood & affect appropriate.     Data Reviewed: I have personally reviewed following labs and imaging studies  CBC: Recent Labs  Lab 08/24/20 1242 08/25/20 0328  WBC 15.1* 13.3*  NEUTROABS  --  12.1*  HGB 11.4* 11.0*  HCT 35.9* 35.6*  MCV 74.9* 75.4*  PLT 360 336   Basic Metabolic Panel: Recent Labs  Lab 08/24/20 1242 08/25/20 0328 08/25/20 1445 08/26/20 0740 08/27/20 0554  NA 135 134* 134* 133* 135  K 4.2 4.2 3.8 4.1 3.9  CL 104 105 101 102 103  CO2 20* 19* 22 20* 21*  GLUCOSE 127* 107* 125* 126* 120*  BUN 7 10 9 14 20   CREATININE 0.80 0.86 0.92 1.01* 1.13*  CALCIUM 8.8* 9.0 8.8* 8.8* 8.7*  MG  --  1.5*  --   --   --   PHOS  --  3.9  --   --    --    GFR: Estimated Creatinine Clearance: 96.1 mL/min (A) (by C-G formula based on SCr of 1.13 mg/dL (H)). Liver Function Tests: Recent Labs  Lab 08/24/20 1242 08/25/20 0328  AST 29 27  ALT 32 33  ALKPHOS 203* 190*  BILITOT 1.2 1.6*  PROT 6.5 6.7  ALBUMIN 2.7* 2.7*   No results for input(s): LIPASE, AMYLASE in the last 168 hours. No results for input(s): AMMONIA in the last 168 hours. Coagulation Profile: No results for input(s): INR, PROTIME in the last 168 hours. Cardiac Enzymes: No results for input(s): CKTOTAL, CKMB, CKMBINDEX, TROPONINI in the last 168 hours. BNP (last 3 results) No results for input(s): PROBNP in the last 8760 hours. HbA1C: Recent Labs    08/26/20 0740  HGBA1C 5.4   CBG: Recent Labs  Lab 08/26/20 0636 08/26/20 1129 08/26/20 1715 08/26/20 2159 08/27/20 0610  GLUCAP 136* 126* 149* 124* 111*   Lipid Profile: No results for input(s): CHOL, HDL, LDLCALC, TRIG, CHOLHDL, LDLDIRECT in the last 72 hours. Thyroid Function Tests: Recent Labs    08/25/20 0328  TSH 1.986   Anemia Panel: No results for input(s): VITAMINB12, FOLATE, FERRITIN, TIBC, IRON, RETICCTPCT in the last  72 hours. Sepsis Labs: No results for input(s): PROCALCITON, LATICACIDVEN in the last 168 hours.  Recent Results (from the past 240 hour(s))  Resp Panel by RT-PCR (Flu A&B, Covid) Nasopharyngeal Swab     Status: None   Collection Time: 08/24/20 12:48 PM   Specimen: Nasopharyngeal Swab; Nasopharyngeal(NP) swabs in vial transport medium  Result Value Ref Range Status   SARS Coronavirus 2 by RT PCR NEGATIVE NEGATIVE Final    Comment: (NOTE) SARS-CoV-2 target nucleic acids are NOT DETECTED.  The SARS-CoV-2 RNA is generally detectable in upper respiratory specimens during the acute phase of infection. The lowest concentration of SARS-CoV-2 viral copies this assay can detect is 138 copies/mL. A negative result does not preclude SARS-Cov-2 infection and should not be used as  the sole basis for treatment or other patient management decisions. A negative result may occur with  improper specimen collection/handling, submission of specimen other than nasopharyngeal swab, presence of viral mutation(s) within the areas targeted by this assay, and inadequate number of viral copies(<138 copies/mL). A negative result must be combined with clinical observations, patient history, and epidemiological information. The expected result is Negative.  Fact Sheet for Patients:  BloggerCourse.com  Fact Sheet for Healthcare Providers:  SeriousBroker.it  This test is no t yet approved or cleared by the [place] FDA and  has been authorized for detection and/or diagnosis of SARS-CoV-2 by FDA under an Emergency Use Authorization (EUA). This EUA will remain  in effect (meaning this test can be used) for the duration of the COVID-19 declaration under Section 564(b)(1) of the Act, 21 U.S.C.section 360bbb-3(b)(1), unless the authorization is terminated  or revoked sooner.       Influenza A by PCR NEGATIVE NEGATIVE Final   Influenza B by PCR NEGATIVE NEGATIVE Final    Comment: (NOTE) The Xpert Xpress SARS-CoV-2/FLU/RSV plus assay is intended as an aid in the diagnosis of influenza from Nasopharyngeal swab specimens and should not be used as a sole basis for treatment. Nasal washings and aspirates are unacceptable for Xpert Xpress SARS-CoV-2/FLU/RSV testing.  Fact Sheet for Patients: BloggerCourse.com  Fact Sheet for Healthcare Providers: SeriousBroker.it  This test is not yet approved or cleared by the [place] FDA and has been authorized for detection and/or diagnosis of SARS-CoV-2 by FDA under an Emergency Use Authorization (EUA). This EUA will remain in effect (meaning this test can be used) for the duration of the COVID-19 declaration under Section 564(b)(1) of  the Act, 21 U.S.C. section 360bbb-3(b)(1), unless the authorization is terminated or revoked.  Performed at [facility], [address] [place], [place] [address]          Radiology Studies: [place] MFM OB LIMITED  Result Date: 08/25/2020 ----------------------------------------------------------------------  OBSTETRICS REPORT                       (Signed Final 08/25/2020 01:20 pm) ---------------------------------------------------------------------- Patient Info  ID #:       [id number]                          D.O.B.:  Jan 25, 1981 (39 yrs)  Name:       [name]               Visit Date: 08/25/2020 10:27 am ---------------------------------------------------------------------- Performed By  Attending:        Ma Rings MD         Ref. Address:  [facility]                                                             OB/GYN &                                                             Infertility                                                             [address]                                                             [place], [place]                                                             [address]  Performed By:     Percell Boston          Secondary Phy.:   [facility]                    RDMS                                                             Care  Referred By:      Alphonsus Sias                Location:         [facility] and                    [facility]                              [facility] ---------------------------------------------------------------------- Orders  #  Description                           Code  Ordered By  1  [place] MFM OB LIMITED                     [id number]    CASSANDRA LAW ----------------------------------------------------------------------  #  Order #                     Accession #                Episode #  1  [id number]                   [id number]                  [id number] ---------------------------------------------------------------------- Indications  Gestational diabetes in pregnancy, (on         O24.419  insulin)  Hypertension - Chronic/Pre-existing (on        O10.019  Labetolol)  [redacted] weeks gestation of pregnancy                Z3A.31  Pregnancy resulting from assisted              O09.819  reproductive technology  Obesity complicating pregnancy, third          O99.212  trimester (BMI:40-45)  Asthma                                         O99.89 j76.909  Advanced maternal age primigravida 22+,        O79.512  third trimester  NIPS:LOw risk, AFP: Neg ---------------------------------------------------------------------- Fetal Evaluation  Num Of Fetuses:         1  Fetal Heart Rate(bpm):  143  Cardiac Activity:       Observed  Presentation:           Breech  Placenta:               Anterior  Amniotic Fluid  AFI FV:      Within normal limits  AFI Sum(cm)     %Tile       Largest Pocket(cm)  12.9            38          4.3  RUQ(cm)       RLQ(cm)       LUQ(cm)        LLQ(cm)  4.3           2.3           4.1            2.2 ---------------------------------------------------------------------- OB History  Gravidity:    1 ---------------------------------------------------------------------- Gestational Age  LMP:           31w 2d        Date:  01/19/20                 EDD:   10/25/20  Best:          31w 2d     Det. By:  LMP  (01/19/20)          EDD:   10/25/20 ---------------------------------------------------------------------- Anatomy  Thoracic:              Appears normal         Bladder:                Appears normal  Stomach:  Appears normal, left                         sided ---------------------------------------------------------------------- Cervix Uterus Adnexa  Cervix  Not visualized (advanced GA >24wks)  Uterus  No abnormality visualized.  Right Ovary  No adnexal mass visualized.  Left Ovary  No adnexal mass visualized.  Cul De Sac  No free fluid seen.   Adnexa  No abnormality visualized. ---------------------------------------------------------------------- Comments  This patient has been hospitalized due to maternal heart  failure with a systolic ejection fraction of between 15 to 20%.  A limited ultrasound performed today shows normal amniotic  fluid.  The fetus is in the breech presentation today.  Fetal  breathing and fetal body movements were noted throughout  today's ultrasound exam.  The patient should continue to be treated with the appropriate  medications for the treatment of heart failure. ----------------------------------------------------------------------                   Ma Rings, MD Electronically Signed Final Report   08/25/2020 01:20 pm ----------------------------------------------------------------------       Scheduled Meds: . aspirin  81 mg Oral QHS  . budesonide  0.25 mg Nebulization BID  . docusate sodium  100 mg Oral Daily  . furosemide  40 mg Intravenous BID  . heparin injection (subcutaneous)  10,000 Units Subcutaneous Q12H  . hydrALAZINE  25 mg Oral Q8H  . insulin aspart  0-12 Units Subcutaneous TID PC  . insulin glargine  36 Units Subcutaneous QHS  . isosorbide dinitrate  10 mg Oral TID  . metoprolol succinate  100 mg Oral Daily  . prenatal multivitamin  1 tablet Oral Q1200  . valACYclovir  1,000 mg Oral QHS  . [START ON 08/31/2020] Vitamin D (Ergocalciferol)  50,000 Units Oral Q7 days   Continuous Infusions:   LOS: 3 days    Time spent: 36 minutes spent on chart review, discussion with nursing staff, consultants, updating family and interview/physical exam; more than 50% of that time was spent in counseling and/or coordination of care.    Alvira Philips [place], DO [facility] Available via Epic secure chat 7am-7pm After these hours, please refer to coverage provider listed on [email or web address] 08/27/2020, 10:40 AM

## 2020-08-27 NOTE — Consult Note (Signed)
MFM Note  I was notified by Dr. Gala Romney that based on the labs that were obtained from the PICC line, the patient is most likely in cardiogenic shock.    Due to this finding, the patient will be transferred to the heart failure ICU for a milrinone drip.    We will plan on performing an NST every 4 hours to assess the fetal status while she is in the cardiac ICU.    The plan is for a cesarean delivery tomorrow once her cardiac status has been optimized. She will be readmitted to the cardiac ICU after delivery.

## 2020-08-27 NOTE — Progress Notes (Signed)
eLink Physician-Brief Progress Note Patient Name: [name] DOB: 11-03-1980 MRN: [id number]   Date of Service  08/27/2020  HPI/Events of Note  12F G1PO at 30 + 6/[redacted] weeks gestation and history of chronic hypertension who was admitted 12/7 for worsening dyspnea, subsequently found to have peri-partum cardiomyopathy (LVEF 10%) and pulmonary edema. She has been transferred to the ICU for BP control and milrinone infusion in order to optimize prior to C-section (tentatively planned for tomorrow). Neonatology, OB, and heart failure services are involved in the patient's care at present.  She is net negative 1.4L yesterday, and so far net negative 0.5L for today.  CXR from 12/8 showed cardiomegaly but significantly improved pulmonary edema compared to prior from 12/7.  She is currently on room air saturating 100%. BP is 126/81. HR 108 (NSR).   eICU Interventions  No additional contributions to plan of care at this time:  - Milrinone drip + digoxin - BP control with  Hydralazine/isosorbide dinitrate - Gentle diuresis - C-section timing per OB team      Intervention Category Evaluation Type: New Patient Evaluation  Marveen Reeks Sawyer [place] 08/27/2020, 8:41 PM

## 2020-08-27 NOTE — CV Procedure (Addendum)
Radial arterial line placement.    Consent obtained. Timeout taken. The left wrist was prepped and draped in the routine sterile fashion a single lumen radial arterial catheter was placed in the left radial artery using a modified Seldinger technique. Good blood flow and wave forms. A dressing was placed.     Arvilla Meres, MD  9:28 PM

## 2020-08-27 NOTE — Progress Notes (Signed)
CSW followed up with patient at bedside. Patient was sitting up and on computer. CSW inquired about how patient was doing today, patient reported that she wasn't feeling well. CSW offered patient well wishes. CSW inquired about any needs/concerns. Patient reported none. CSW encouraged patient to contact CSW if any needs/concerns arise.   Celso Sickle, LCSW Clinical Social Worker [facility] Cell#: [phone]

## 2020-08-27 NOTE — Consult Note (Signed)
[facility]  Prenatal Consult       08/27/2020  5:02 PM   I was asked by Dr. Ernestina Penna to consult on this patient for possible preterm delivery.  I had the pleasure of meeting with [name] today.  She is a 39 y/o G1P0 at 22 and 6/[redacted] weeks gestation who was admitted on 12/7 for worsening shortness of breath in the setting of chronic hypertension, diabetes, and asthma for which she was on labetalol, Procardia and insulin.  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  On further evaluation, her ejection fraction is down to 15% and she has developed pulmonary edema.  Serum creatinine is also slowly rising.  This is thought to be acute on chronic diastolic chronic heart failure decompensation due to uncontrolled hypertension.  She is now receiving metoprolol, hydralazine, Isordil, and lasix.  She will be transferred to the cardiac ICU tonight to be started on a milrinone drip.  Due to worsening status, obstetrical and cardiac team are working closely together to determine best timing of delivery, and this will likely be tomorrow at [redacted] weeks gestation.  In the meantime she has received betamethasone x2.  I explained that the neonatal intensive care team would be present for the delivery and outlined the likely delivery room course for this baby including routine resuscitation and NRP-guided approaches to the treatment of respiratory distress. We discussed other common problems associated with prematurity including respiratory distress syndrome/CLD, apnea, feeding issues, temperature regulation, and infection risk.  We briefly discussed IVH/PVL, ROP, and NEC and that these are complications associated with prematurity, but that by 30 weeks are uncommon.    We discussed the average length of stay but I noted that the actual LOS would depend on the severity of problems encountered and response to treatments.  We discussed visitation policies and the resources available while her  child is in the hospital.  We discussed the importance of good nutrition and various methods of providing nutrition (parenteral hyperalimentation, gavage feedings and/or oral feeding). We discussed the benefits of human milk. I encouraged breast feeding and pumping soon after birth and outlined resources that are available to support breast feeding.   Thank you for involving [place] in the care of this patient. A member of our team will be available should the family have additional questions.  Time for consultation approximately 45 minutes.   _____________________ Electronically Signed By: Maryan Char, MD Neonatologist

## 2020-08-27 NOTE — Progress Notes (Signed)
Patient seen and examined. Consent witnessed and signed. No changes noted. Update completed. BP 115/83 (BP Location: Left Arm)   Pulse (!) 105   Temp (!) 97.5 F (36.4 C) (Oral)   Resp 17   Ht 5\' 8"  (1.727 m)   Wt 131.9 kg   LMP 01/19/2020   SpO2 100%   BMI 44.21 kg/m   Reviewed MFM and Cardio notes. Discussed with Dr. 03/20/2020. NICU and anesthesia consults done. TL discussed and added to consent. Failure risks discussed.  Risks of infection, bleeding and injury to surrounding organs with need for repair discussed. Pt acknowledges and wishes to proceed.

## 2020-08-27 NOTE — Plan of Care (Signed)
  Problem: Education: Goal: Knowledge of General Education information will improve Description: Including pain rating scale, medication(s)/side effects and non-pharmacologic comfort measures Outcome: Progressing   Problem: Clinical Measurements: Goal: Ability to maintain clinical measurements within normal limits will improve Outcome: Progressing Goal: Diagnostic test results will improve Outcome: Progressing Goal: Respiratory complications will improve Outcome: Progressing   Problem: Coping: Goal: Level of anxiety will decrease Outcome: Progressing

## 2020-08-27 NOTE — Progress Notes (Addendum)
  PICC line placed.  MVsat is 40.9% suggestive of low cardiac output/shock.   Patient remains quite symptomatic. + orthopnea/tachypnea. BP stable.   Will transfer to 65M ICU for initiation of inotropic support (milrinone) and close monitoring of MV sats and CVPs. MFM and OB contacted and are arranging for fetal monitoring.   Will titrate inotropes to stabilize HF. If we can get MV sat to ~60% or greater will plan C-section at 9a tomorrow in [facility] ER with Dr. Rosemary Holms (OB) and Dr. Curt Jews (Anesthesia).   If unable to stabilize cardiac output with inotropes and/or patient decompensates will need mechanical support with Impella 5.5 - though I am hopeful we won't need this.   Post delivery baby will go to NICU and patient will go to [facility] for ongoing monitoring/support.   I have discussed personally with OB, MFM, Anesthesia, TCTS and nursing staff as well as the patient and her mother.  CCT 90 mins.   Arvilla Meres, MD  5:56 PM

## 2020-08-27 NOTE — Progress Notes (Signed)
2207[name] at bedside for NST. Declines LOF, or bleeding. Endorses fetal movement. States that she had some abdominal cramping earlier that has resolved. Baseline 150bpm, moderate variability, with 15x15, no decelerations present. No UC noted, abdomen soft to palpation. Pt sleeping on and off, states she's "feeling better than she was".

## 2020-08-27 NOTE — Progress Notes (Addendum)
Advanced Heart Failure Rounding Note  PCP-Cardiologist: Chilton Si, MD   Subjective:    - 2.7 L in UOP yesterday after IV Lasix. Bump in SCr from 0.8>>0.92>>1.13.  K stable at 3.9   BP controlled on current regimen but SBP a bit soft in low 100s.   She feels slightly worse today. More SOB. Also complains of nausea but no vomiting. Denies abdominal pain.   Objective:   Weight Range: 131.9 kg Body mass index is 44.21 kg/m.   Vital Signs:   Temp:  [97.5 F (36.4 C)-98.2 F (36.8 C)] 97.6 F (36.4 C) (12/10 0730) Pulse Rate:  [84-109] 96 (12/10 0730) Resp:  [18-19] 18 (12/10 0730) BP: (117-128)/(66-96) 120/66 (12/10 0730) SpO2:  [96 %-100 %] 96 % (12/10 0730) Weight:  [131.9 kg] 131.9 kg (12/10 0608) Last BM Date: 08/25/20  Weight change: Filed Weights   08/25/20 0611 08/26/20 0639 08/27/20 3419  Weight: 131.1 kg 131.4 kg 131.9 kg    Intake/Output:   Intake/Output Summary (Last 24 hours) at 08/27/2020 1005 Last data filed at 08/27/2020 0615 Gross per 24 hour  Intake 960 ml  Output 2700 ml  Net -1740 ml      Physical Exam    PHYSICAL EXAM: General:  fatigued appearing pregnant female. Mildly SOB repositioning in bed  HEENT: normal Neck: supple. no JVD. Carotids 2+ bilat; no bruits. No lymphadenopathy or thyromegaly appreciated. Cor: PMI nondisplaced. Regular rhythm, mildly tachy rate. No rubs, gallops or murmurs. Lungs: clear, no crackles  Abdomen: pregnant No hepatosplenomegaly. No bruits or masses. Good bowel sounds. Extremities: no cyanosis, clubbing, rash, 2+ bilateral LEE edema + TED hoses  Neuro: alert & oriented x 3, cranial nerves grossly intact. moves all 4 extremities w/o difficulty. Affect pleasant.    Telemetry    sinus tach low 100s   EKG    n/a  Labs    CBC Recent Labs    08/24/20 1242 08/25/20 0328  WBC 15.1* 13.3*  NEUTROABS  --  12.1*  HGB 11.4* 11.0*  HCT 35.9* 35.6*  MCV 74.9* 75.4*  PLT 360 336   Basic  Metabolic Panel Recent Labs    37/90/24 0328 08/25/20 1445 08/26/20 0740 08/27/20 0554  NA 134*   < > 133* 135  K 4.2   < > 4.1 3.9  CL 105   < > 102 103  CO2 19*   < > 20* 21*  GLUCOSE 107*   < > 126* 120*  BUN 10   < > 14 20  CREATININE 0.86   < > 1.01* 1.13*  CALCIUM 9.0   < > 8.8* 8.7*  MG 1.5*  --   --   --   PHOS 3.9  --   --   --    < > = values in this interval not displayed.   Liver Function Tests Recent Labs    08/24/20 1242 08/25/20 0328  AST 29 27  ALT 32 33  ALKPHOS 203* 190*  BILITOT 1.2 1.6*  PROT 6.5 6.7  ALBUMIN 2.7* 2.7*   No results for input(s): LIPASE, AMYLASE in the last 72 hours. Cardiac Enzymes No results for input(s): CKTOTAL, CKMB, CKMBINDEX, TROPONINI in the last 72 hours.  BNP: BNP (last 3 results) Recent Labs    08/24/20 1242  BNP 241.0*    ProBNP (last 3 results) No results for input(s): PROBNP in the last 8760 hours.   D-Dimer Recent Labs    08/24/20 1242  DDIMER 1.80*  Hemoglobin A1C Recent Labs    08/26/20 0740  HGBA1C 5.4   Fasting Lipid Panel No results for input(s): CHOL, HDL, LDLCALC, TRIG, CHOLHDL, LDLDIRECT in the last 72 hours. Thyroid Function Tests Recent Labs    08/25/20 0328  TSH 1.986    Other results:   Imaging    No results found.   Medications:     Scheduled Medications: . aspirin  81 mg Oral QHS  . budesonide  0.25 mg Nebulization BID  . docusate sodium  100 mg Oral Daily  . furosemide  40 mg Intravenous BID  . heparin injection (subcutaneous)  10,000 Units Subcutaneous Q12H  . hydrALAZINE  25 mg Oral Q8H  . insulin aspart  0-12 Units Subcutaneous TID PC  . insulin glargine  36 Units Subcutaneous QHS  . isosorbide dinitrate  10 mg Oral TID  . metoprolol succinate  100 mg Oral Daily  . prenatal multivitamin  1 tablet Oral Q1200  . valACYclovir  1,000 mg Oral QHS  . [START ON 08/31/2020] Vitamin D (Ergocalciferol)  50,000 Units Oral Q7 days    Infusions:   PRN  Medications: acetaminophen, albuterol, calcium carbonate, zolpidem    Assessment/Plan   1. Acute Biventricular Heart Failure  - Echo this admit severely reduced EF 15-20%. - She is [redacted] weeks pregnant so would not consider this peri-partum CM. Suspect possible hypertensive cardiomyopathy with severe biventricular dysfunction - BP now controlled on medical therapy but SBPs a bit soft and now w/ mild AKI - Hold diuretics for now - reduce Toprol XL down to 50 mg daily - add digoxin 0.125 mg   - continue hydralazine 25 mg tid - continue Isordil 10 mg tid - repeat BMP in am to see if SCr improves - concern that her increased symptoms today may be due to low output. D/w MFM team. Will plan placement of PICC line to check Co-ox and CVPs.  - if low co-ox, she will need transfer to cardiac ICU (2H) for initiation of inotropes to stabilize HF prior to delivery   2. HTN  - Controlled on current regimen  - per OB/GYN no evidence of preeclampsia  - as noted above, reduced Toprol XL dose to 50 mg daily.  - continue hydral and isordil at current doses   3. DMI - insulin per primary team  4. OSA - Recently diagnosed   5. H/O Asthma.   6. AKI - bump in SCr w/ diuresis - baseline SCr 0.8 - 1.13 today  - ? Low output vs overdiuresis - place PICC to check co-ox and CVPs - hold diuretics for now, plan to resume lasix if CVPs elevated - repeat BMP in AM      Length of Stay: 3  Brittainy Simmons, PA-C  08/27/2020, 10:05 AM  Advanced Heart Failure Team Pager [phone] (M-F; 7a - 4p)  Please contact [facility] for night-coverage after hours (4p -7a ) and weekends on [email or web address]  Patient seen and examined with the above-signed Advanced Practice Provider and/or Housestaff. I personally reviewed laboratory data, imaging studies and relevant notes. I independently examined the patient and formulated the important aspects of the plan. I have edited the note to reflect any of my changes or  salient points. I have personally discussed the plan with the patient and/or family.  Remains on IV lasix. Weight up a pound. She feels worse today. + orthopnea. Creatinine trending up.  NSTs have been ok.   General:  Sitting up in bed  No obvious  resp difficulty HEENT: normal Neck: supple. JVP hard to see. Carotids 2+ bilat; no bruits. No lymphadenopathy or thryomegaly appreciated. Cor: PMI nondisplaced. Regular rate & rhythm. No rubs, gallops or murmurs. Lungs: clear Abdomen: obese gravid  soft, nontender, nondistended. No hepatosplenomegaly. No bruits or masses. Good bowel sounds. Extremities: no cyanosis, clubbing, rash, tr-1+ edema Neuro: alert & orientedx3, cranial nerves grossly intact. moves all 4 extremities w/o difficulty. Affect pleasant  Per my initial consult note. Initial plan was to try to get her to 34-36 weeks prior to delivery for fetal development and also give her heart time to recover as long as she was feeling ok and NSTs ok.    However, she is much more symptomatic today despite ongoing IV diuretics. Creatinine starting to bump.   I am concerned about possible low output. Will hold lasix for now. Stop b-blocker. Start digoxin. Place PICC line to measure mixed venous O2 sat (venous blood gas off PICC) and CVP, if monitoring available. If co-ox low will need support with IV milrinone and relatively prompt delivery.If co-ox OK then would keep in house with close monitoring and try to get her as close to 32 weeks as possible.  I have d/w MFM team by phone who agree with plan.   I have left a phone message to Dr. Ernestina Penna to discuss.   Arvilla Meres, MD  3:12 PM

## 2020-08-27 NOTE — Anesthesia Preprocedure Evaluation (Addendum)
Anesthesia Evaluation  Patient identified by MRN, date of birth, ID band Patient awake    Reviewed: Allergy & Precautions, NPO status , Patient's Chart, lab work & pertinent test results  Airway Mallampati: II  TM Distance: >3 FB Neck ROM: Full    Dental no notable dental hx. (+) Teeth Intact   Pulmonary asthma , sleep apnea ,    Pulmonary exam normal        Cardiovascular hypertension, +CHF  Normal cardiovascular examIV Rhythm:Regular Rate:Normal     Neuro/Psych negative neurological ROS  negative psych ROS   GI/Hepatic negative GI ROS, Neg liver ROS,   Endo/Other  diabetes, GestationalMorbid obesity  Renal/GU negative Renal ROS  negative genitourinary   Musculoskeletal negative musculoskeletal ROS (+)   Abdominal Normal abdominal exam  (+) + obese,   Peds  Hematology  (+) anemia ,   Anesthesia Other Findings   Reproductive/Obstetrics (+) Pregnancy                           Anesthesia Physical Anesthesia Plan  ASA: IV  Anesthesia Plan: Combined Spinal and Epidural   Post-op Pain Management:    Induction:   PONV Risk Score and Plan: 3 and Ondansetron and Treatment may vary due to age or medical condition  Airway Management Planned: Natural Airway, Nasal Cannula and Simple Face Mask  Additional Equipment: Arterial line  Intra-op Plan:   Post-operative Plan:   Informed Consent: I have reviewed the patients History and Physical, chart, labs and discussed the procedure including the risks, benefits and alternatives for the proposed anesthesia with the patient or authorized representative who has indicated his/her understanding and acceptance.     Dental advisory given  Plan Discussed with: CRNA and Surgeon  Anesthesia Plan Comments: (Lab Results      Component                Value               Date                      WBC                      15.8 (H)             08/28/2020                HGB                      10.3 (L)            08/28/2020                HCT                      32.2 (L)            08/28/2020                MCV                      75.6 (L)            08/28/2020                PLT                      292  08/28/2020           Lab Results      Component                Value               Date                      NA                       137                 08/28/2020                K                        3.5                 08/28/2020                CO2                      22                  08/28/2020                GLUCOSE                  143 (H)             08/28/2020                BUN                      20                  08/28/2020                CREATININE               0.92                08/28/2020                CALCIUM                  8.6 (L)             08/28/2020                GFRNONAA                 >60                 08/28/2020                GFRAA                    >60                 09/19/2018           ECHO 08/24/20: 1. There is no left ventricular thrombus with Definity contrast. Left  ventricular ejection fraction, by estimation, is 15-20%. The left  ventricle has severely decreased function. The left ventricle demonstrates  global hypokinesis. The left ventricular  internal cavity size was moderately dilated. Left ventricular diastolic  parameters are consistent with Grade III diastolic dysfunction  (restrictive). Elevated left atrial  pressure.  2. Right ventricular systolic function is moderately reduced. The right  ventricular size is normal. There is moderately elevated pulmonary artery  systolic pressure. The estimated right ventricular systolic pressure is  45.0 mmHg.  3. Left atrial size was severely dilated.  4. Right atrial size was moderately dilated.  5. The pericardial effusion is circumferential.  6. The mitral valve is normal in structure. Mild to moderate  mitral valve  regurgitation.  7. The aortic valve is normal in structure. Aortic valve regurgitation is  not visualized. No aortic stenosis is present.  8. The inferior vena cava is dilated in size with <50% respiratory  variability, suggesting right atrial pressure of 15 mmHg. )       Anesthesia Quick Evaluation

## 2020-08-27 NOTE — Progress Notes (Signed)
Peripherally Inserted Central Catheter Placement  The IV Nurse has discussed with the patient and/or persons authorized to consent for the patient, the purpose of this procedure and the potential benefits and risks involved with this procedure.  The benefits include less needle sticks, lab draws from the catheter, and the patient may be discharged home with the catheter. Risks include, but not limited to, infection, bleeding, blood clot (thrombus formation), and puncture of an artery; nerve damage and irregular heartbeat and possibility to perform a PICC exchange if needed/ordered by physician.  Alternatives to this procedure were also discussed.  Bard Power PICC patient education guide, fact sheet on infection prevention and patient information card has been provided to patient /or left at bedside.    PICC Placement Documentation  PICC Double Lumen 08/27/20 PICC Right Basilic 39 cm 0 cm (Active)  Indication for Insertion or Continuance of Line Chronic illness with exacerbations (CF, Sickle Cell, etc.) 08/27/20 1536  Exposed Catheter (cm) 0 cm 08/27/20 1536  Site Assessment Clean;Dry;Intact 08/27/20 1536  Lumen #1 Status Flushed;Blood return noted 08/27/20 1536  Lumen #2 Status Flushed;Blood return noted 08/27/20 1536  Dressing Type Transparent 08/27/20 1536  Dressing Status Clean;Dry;Intact 08/27/20 1536  Antimicrobial disc in place? Yes 08/27/20 1536  Safety Lock Placed;Intact 08/27/20 1536  Line Adjustment (NICU/IV Team Only) No 08/27/20 1536  Dressing Intervention Dressing changed;Other (Comment) 08/27/20 1536  Dressing Change Due 09/03/20 08/27/20 1536       Reginia Forts Albarece 08/27/2020, 3:37 PM

## 2020-08-27 NOTE — Progress Notes (Signed)
39 year old morbidly obese G1 with chronic hypertension, type 2 diabetes and new diagnosis of congestive heart failure with EF 15 to 20% at 30 weeks and 6 days  Subjective: Patient notes feeling worse today. Patient states "I feel like I am going backwards". Patient notes more shortness of breath today. No chest pain. Patient states lower extremity edema is "the same". Patient has no contractions, good fetal movement, no leakage of fluid, no vaginal bleeding  Objective: Vitals with BMI 08/27/2020 08/27/2020 08/26/2020  Height - - -  Weight - 290 lbs 12 oz -  BMI - 44.22 -  Systolic 120 117 528  Diastolic 66 76 72  Pulse 96 107 109   7:30 AM vitals with saturation 96% on room air  General: Sitting on couch no distress but appears with increased work of breathing compared to visit yesterday Abdomen: Obese, gravid, nontender, no right upper quadrant pain GU: Deferred Lower extremity: 4+ pitting edema, nontender,  NST: 150s, +10 x 10 accelerations, 10 beat variability, no decelerations but oftentimes on fetal monitoring strip we are picking up maternal heart rate Toco: None  CMP Latest Ref Rng & Units 08/27/2020 08/26/2020 08/25/2020  Glucose 70 - 99 mg/dL 413(K) 440(N) 027(O)  BUN 6 - 20 mg/dL 20 14 9   Creatinine 0.44 - 1.00 mg/dL ) 5.36(U) 4.40(H  Sodium 135 - 145 mmol/L 135 133(L) 134(L)  Potassium 3.5 - 5.1 mmol/L 3.9 4.1 3.8  Chloride 98 - 111 mmol/L 103 102 101  CO2 22 - 32 mmol/L 21(L) 20(L) 22  Calcium 8.9 - 10.3 mg/dL 4.74) 2.5(Z) 5.6(L)  Total Protein 6.5 - 8.1 g/dL - - -  Total Bilirubin 0.3 - 1.2 mg/dL - - -  Alkaline Phos 38 - 126 U/L - - -  AST 15 - 41 U/L - - -  ALT 0 - 44 U/L - - -    Assessment and plan: 39 year old G1 at 30 weeks and 6 days with multiple medical problems most significantly congestive heart failure  -Congestive heart failure. Appreciate recommendations from cardiology and heart failure team. Medication changed from labetalol and Procardia to  metoprolol, hydralazine and isordil. Patient with initial improvement to her shortness of breath and vital signs on Lasix 40 mg IV twice daily though concerns that that is becoming less effective over time given her complaints today. Also I have  concern with rising creatinine, likely due to Lasix. No other symptoms of preeclampsia. Awaiting recommendations from the heart failure team today as to long-term plan including use of Lasix and how best to monitor her cardiac status in order to determine best timing of delivery. Per patient the cardiology team has indicated a possibility for outpatient management. If they do endorse this  I would appreciate a  plan for cardiac monitoring and medications in the outpatient.  -Chronic hypertension. Impacting her current status. Blood pressures under adequate control at this time. No evidence of preeclampsia  -Type 2 diabetes. Patient with no prior diagnosis of this however 10-week glucose tolerance test at 270 patient rapidly increased her Lantus. Blood sugars have been under excellent control with hemoglobin A1c earlier this week to 5.4. Blood sugars have been under adequate control since admission despite betamethasone. Appreciate help from diabetic nursing. Patient on is on Lantus with sliding scale as needed.  -Asthma. New diagnosis in this pregnancy. Patient is seeing pulmonology as an outpatient around 20 weeks  Morbid obesity  Obstructive sleep apnea. Recent diagnosis. Patient has not been using any CPAP  Mode of  delivery. Patient understands she will likely need for C-section as increased risks with prolonged induction of labor. Currently baby is breech so would need to C-section in labor. Anesthesia team is aware of the patient and has suggested possible delivery in the main OR for better cardiopulmonary support if needed intraoperatively. We will continue to address this. If delivery seems imminent would further discuss risk and benefits of magnesium for  fetal neuro protection and would need to hold off on heparin prophylaxis.  Prematurity. Status post betamethasone x2. Baby has not yet had magnesium sulfate  Disposition- inpatient for now. Continue discussions between heart failure team and MFM regarding disposition. Greatly appreciate their input and understand they are planning to have a discussion later today on patient's plan.  Tresa Endo A Emitt Maglione 08/27/2020 11:00 AM

## 2020-08-27 NOTE — Consult Note (Signed)
MFM Note  [name] has been hospitalized due to congestive heart failure. She is currently at 30 weeks and 6 days.  She has already received a complete course of antenatal corticosteroids.    Her cardiac condition has been managed by the advanced heart failure team.  She has been treated with IV Lasix, metoprolol, hydralazine and isosorbide dinitrate for the past few days.  Her shortness of breath symptoms initially improved.  However over the past 2 days, she has noted increased shortness of breath at rest and while she is ambulating.  She has noted that her lower extremity edema is less now as compared to when she first was admitted to the hospital.  A concerning finding is that her serum creatinine levels have been slowly rising over the past few days.  Her serum creatinine level was 1.13 today.  I discussed the management of her care with the advanced heart failure team.  They stated that the rise in her serum creatinine could be the result of overly aggressive diuresis or due to a decrease in cardiac function.   Dr.Bensimhon has recommended that the patient have a PICC line placed so that labs can be drawn to help guide further treatment.  I advised the patient that a PICC line can be safely placed in a pregnant woman.   To help improve her cardiac function, the heart failure team has held her Lasix and decreased the dosage of metoprolol.  They are considering further treatment with digoxin and other inotropes if necessary.  They are also considering transferring the patient to the heart failure intensive care unit if necessary.  I advised the patient that due to her worsening cardiac status, delivery may be necessary soon.   She was reassured that the neonatal outcomes for delivery at her current gestational age is generally good.  A NICU consult will be ordered to discuss the outcomes and management of babies delivered at her current gestational age.  Due to concerns regarding worsening  pulmonary edema and the questionable benefits of magnesium for fetal neuroprotection at 31+ weeks, magnesium sulfate should not be given prior to delivery.  We will continue to work with the advanced heart failure team to optimize her cardiac status prior to delivery and to identify when delivery is necessary.  I will be available at any time to discuss her management.

## 2020-08-28 ENCOUNTER — Inpatient Hospital Stay (HOSPITAL_COMMUNITY): Payer: BC Managed Care – PPO | Admitting: Certified Registered Nurse Anesthetist

## 2020-08-28 ENCOUNTER — Encounter (HOSPITAL_COMMUNITY)
Admission: AD | Disposition: A | Discharge status: Home / Self Care | Payer: Self-pay | Source: Home / Self Care | Attending: Obstetrics and Gynecology

## 2020-08-28 ENCOUNTER — Encounter (HOSPITAL_COMMUNITY): Payer: Self-pay | Admitting: Obstetrics and Gynecology

## 2020-08-28 LAB — CBC WITH DIFFERENTIAL/PLATELET
Abs Immature Granulocytes: 0.31 10*3/uL — ABNORMAL HIGH (ref 0.00–0.07)
Abs Immature Granulocytes: 0.15 10*3/uL — ABNORMAL HIGH (ref 0.00–0.07)
Basophils Absolute: 0.1 10*3/uL (ref 0.0–0.1)
Basophils Absolute: 0 10*3/uL (ref 0.0–0.1)
Basophils Relative: 0 %
Basophils Relative: 0 %
Eosinophils Absolute: 0 10*3/uL (ref 0.0–0.5)
Eosinophils Absolute: 0 10*3/uL (ref 0.0–0.5)
Eosinophils Relative: 0 %
Eosinophils Relative: 0 %
HCT: 32.2 % — ABNORMAL LOW (ref 36.0–46.0)
HCT: 34.3 % — ABNORMAL LOW (ref 36.0–46.0)
Hemoglobin: 10.3 g/dL — ABNORMAL LOW (ref 12.0–15.0)
Hemoglobin: 10.4 g/dL — ABNORMAL LOW (ref 12.0–15.0)
Immature Granulocytes: 2 %
Immature Granulocytes: 1 %
Lymphocytes Relative: 12 %
Lymphocytes Relative: 6 %
Lymphs Abs: 1.9 10*3/uL (ref 0.7–4.0)
Lymphs Abs: 1.3 10*3/uL (ref 0.7–4.0)
MCH: 24.2 pg — ABNORMAL LOW (ref 26.0–34.0)
MCH: 23.4 pg — ABNORMAL LOW (ref 26.0–34.0)
MCHC: 32 g/dL (ref 30.0–36.0)
MCHC: 30.3 g/dL (ref 30.0–36.0)
MCV: 75.6 fL — ABNORMAL LOW (ref 80.0–100.0)
MCV: 77.1 fL — ABNORMAL LOW (ref 80.0–100.0)
Monocytes Absolute: 1.6 10*3/uL — ABNORMAL HIGH (ref 0.1–1.0)
Monocytes Absolute: 1.8 10*3/uL — ABNORMAL HIGH (ref 0.1–1.0)
Monocytes Relative: 10 %
Monocytes Relative: 9 %
Neutro Abs: 12 10*3/uL — ABNORMAL HIGH (ref 1.7–7.7)
Neutro Abs: 17.6 10*3/uL — ABNORMAL HIGH (ref 1.7–7.7)
Neutrophils Relative %: 76 %
Neutrophils Relative %: 84 %
Platelets: 292 10*3/uL (ref 150–400)
Platelets: 271 10*3/uL (ref 150–400)
RBC: 4.26 MIL/uL (ref 3.87–5.11)
RBC: 4.45 MIL/uL (ref 3.87–5.11)
RDW: 16.5 % — ABNORMAL HIGH (ref 11.5–15.5)
RDW: 16.6 % — ABNORMAL HIGH (ref 11.5–15.5)
WBC: 15.8 10*3/uL — ABNORMAL HIGH (ref 4.0–10.5)
WBC: 20.9 10*3/uL — ABNORMAL HIGH (ref 4.0–10.5)
nRBC: 1.6 % — ABNORMAL HIGH (ref 0.0–0.2)
nRBC: 0.4 % — ABNORMAL HIGH (ref 0.0–0.2)

## 2020-08-28 LAB — BASIC METABOLIC PANEL
Anion gap: 11 (ref 5–15)
BUN: 20 mg/dL (ref 6–20)
CO2: 22 mmol/L (ref 22–32)
Calcium: 8.6 mg/dL — ABNORMAL LOW (ref 8.9–10.3)
Chloride: 104 mmol/L (ref 98–111)
Creatinine, Ser: 0.92 mg/dL (ref 0.44–1.00)
GFR, Estimated: >60 mL/min (ref >60)
Glucose, Bld: 143 mg/dL — ABNORMAL HIGH (ref 70–99)
Potassium: 3.5 mmol/L (ref 3.5–5.1)
Sodium: 137 mmol/L (ref 135–145)

## 2020-08-28 LAB — COOXEMETRY PANEL
Carboxyhemoglobin: 1.5 % (ref 0.5–1.5)
Carboxyhemoglobin: 1.5 % (ref 0.5–1.5)
Methemoglobin: 0.9 % (ref 0.0–1.5)
Methemoglobin: 0.6 % (ref 0.0–1.5)
O2 Saturation: 88 %
O2 Saturation: 64.8 %
Total hemoglobin: 10.6 g/dL — ABNORMAL LOW (ref 12.0–16.0)
Total hemoglobin: 6.8 g/dL — CL (ref 12.0–16.0)

## 2020-08-28 LAB — SURGICAL PCR SCREEN
MRSA, PCR: NEGATIVE
Staphylococcus aureus: NEGATIVE

## 2020-08-28 LAB — GLUCOSE, CAPILLARY
Glucose-Capillary: 172 mg/dL — ABNORMAL HIGH (ref 70–99)
Glucose-Capillary: 107 mg/dL — ABNORMAL HIGH (ref 70–99)
Glucose-Capillary: 97 mg/dL (ref 70–99)
Glucose-Capillary: 76 mg/dL (ref 70–99)

## 2020-08-28 LAB — PROTIME-INR
INR: 1.1 (ref 0.8–1.2)
Prothrombin Time: 14.1 seconds (ref 11.4–15.2)

## 2020-08-28 LAB — MAGNESIUM: Magnesium: 1.5 mg/dL — ABNORMAL LOW (ref 1.7–2.4)

## 2020-08-28 LAB — MRSA PCR SCREENING: MRSA by PCR: NEGATIVE

## 2020-08-28 SURGERY — Surgical Case
Anesthesia: Spinal

## 2020-08-28 MED ORDER — WITCH HAZEL-GLYCERIN EX PADS
1.0000 "application " | MEDICATED_PAD | CUTANEOUS | Status: DC | PRN
  Filled 2020-08-28 (×2): qty 100

## 2020-08-28 MED ORDER — ONDANSETRON HCL 4 MG/2ML IJ SOLN
INTRAMUSCULAR | Status: AC
  Filled 2020-08-28: qty 2

## 2020-08-28 MED ORDER — MORPHINE SULFATE (PF) 0.5 MG/ML IJ SOLN
INTRAMUSCULAR | Status: AC
  Filled 2020-08-28: qty 10

## 2020-08-28 MED ORDER — SODIUM BICARBONATE 8.4 % IV SOLN
INTRAVENOUS | Status: DC | PRN
  Administered 2020-08-28: 5 mL via EPIDURAL
  Administered 2020-08-28: 3 mL via EPIDURAL

## 2020-08-28 MED ORDER — POTASSIUM CHLORIDE CRYS ER 20 MEQ PO TBCR
40.0000 meq | EXTENDED_RELEASE_TABLET | Freq: Once | ORAL | Status: AC
  Administered 2020-08-28: 40 meq via ORAL
  Filled 2020-08-28: qty 2

## 2020-08-28 MED ORDER — ONDANSETRON HCL 4 MG/2ML IJ SOLN
4.0000 mg | Freq: Three times a day (TID) | INTRAMUSCULAR | Status: DC | PRN
  Administered 2020-08-28 (×2): 4 mg via INTRAVENOUS
  Filled 2020-08-28 (×3): qty 2

## 2020-08-28 MED ORDER — SODIUM CHLORIDE 0.9 % IV SOLN: INTRAVENOUS | Status: DC | PRN

## 2020-08-28 MED ORDER — DIBUCAINE (PERIANAL) 1 % EX OINT
1.0000 "application " | TOPICAL_OINTMENT | CUTANEOUS | Status: DC | PRN
  Filled 2020-08-28 (×3): qty 28

## 2020-08-28 MED ORDER — LACTATED RINGERS IV SOLN: INTRAVENOUS | Status: DC | PRN

## 2020-08-28 MED ORDER — DIPHENHYDRAMINE HCL 50 MG/ML IJ SOLN
12.5000 mg | INTRAMUSCULAR | Status: DC | PRN
  Administered 2020-08-29 (×2): 12.5 mg via INTRAVENOUS
  Filled 2020-08-28 (×2): qty 1

## 2020-08-28 MED ORDER — TRIAMCINOLONE ACETONIDE 40 MG/ML IJ SUSP
INTRAMUSCULAR | Status: DC | PRN
  Administered 2020-08-28: 40 mg

## 2020-08-28 MED ORDER — TRIAMCINOLONE ACETONIDE 40 MG/ML IJ SUSP
INTRAMUSCULAR | Status: AC
  Filled 2020-08-28: qty 5

## 2020-08-28 MED ORDER — HYDROMORPHONE HCL 1 MG/ML IJ SOLN: 0.2500 mg | INTRAMUSCULAR | Status: DC | PRN

## 2020-08-28 MED ORDER — DIPHENHYDRAMINE HCL 25 MG PO CAPS
25.0000 mg | ORAL_CAPSULE | ORAL | Status: DC | PRN
  Administered 2020-08-28: 25 mg via ORAL
  Filled 2020-08-28: qty 1

## 2020-08-28 MED ORDER — ONDANSETRON HCL 4 MG/2ML IJ SOLN
INTRAMUSCULAR | Status: DC | PRN
  Administered 2020-08-28: 4 mg via INTRAVENOUS

## 2020-08-28 MED ORDER — NALBUPHINE HCL 10 MG/ML IJ SOLN
5.0000 mg | INTRAMUSCULAR | Status: DC | PRN
  Filled 2020-08-28: qty 0.5

## 2020-08-28 MED ORDER — MORPHINE SULFATE (PF) 0.5 MG/ML IJ SOLN
INTRAMUSCULAR | Status: DC | PRN
  Administered 2020-08-28: 150 ug via INTRATHECAL

## 2020-08-28 MED ORDER — BUPIVACAINE HCL (PF) 0.25 % IJ SOLN
INTRAMUSCULAR | Status: AC
  Filled 2020-08-28: qty 30

## 2020-08-28 MED ORDER — NALBUPHINE HCL 10 MG/ML IJ SOLN: 5.0000 mg | Freq: Once | INTRAMUSCULAR | Status: DC | PRN

## 2020-08-28 MED ORDER — IBUPROFEN 200 MG PO TABS
800.0000 mg | ORAL_TABLET | Freq: Four times a day (QID) | ORAL | Status: DC
  Filled 2020-08-28: qty 1

## 2020-08-28 MED ORDER — NALOXONE HCL 0.4 MG/ML IJ SOLN: 0.4000 mg | INTRAMUSCULAR | Status: DC | PRN

## 2020-08-28 MED ORDER — KETOROLAC TROMETHAMINE 30 MG/ML IJ SOLN
30.0000 mg | Freq: Four times a day (QID) | INTRAMUSCULAR | Status: AC
  Administered 2020-08-28 – 2020-08-29 (×4): 30 mg via INTRAVENOUS
  Filled 2020-08-28 (×4): qty 1

## 2020-08-28 MED ORDER — OXYTOCIN-SODIUM CHLORIDE 30-0.9 UT/500ML-% IV SOLN
INTRAVENOUS | Status: DC | PRN
  Administered 2020-08-28: 30 [IU] via INTRAVENOUS

## 2020-08-28 MED ORDER — CEFAZOLIN SODIUM-DEXTROSE 2-3 GM-%(50ML) IV SOLR
INTRAVENOUS | Status: DC | PRN
  Administered 2020-08-28: 3 g via INTRAVENOUS

## 2020-08-28 MED ORDER — PHENYLEPHRINE HCL-NACL 20-0.9 MG/250ML-% IV SOLN
INTRAVENOUS | Status: DC | PRN
  Administered 2020-08-28: 60 ug/min via INTRAVENOUS

## 2020-08-28 MED ORDER — PROPOFOL 10 MG/ML IV BOLUS
INTRAVENOUS | Status: AC
  Filled 2020-08-28: qty 20

## 2020-08-28 MED ORDER — COCONUT OIL OIL
1.0000 "application " | TOPICAL_OIL | Status: DC | PRN
  Filled 2020-08-28 (×3): qty 120

## 2020-08-28 MED ORDER — SCOPOLAMINE 1 MG/3DAYS TD PT72
1.0000 | MEDICATED_PATCH | Freq: Once | TRANSDERMAL | Status: AC
  Administered 2020-08-28: 1.5 mg via TRANSDERMAL
  Filled 2020-08-28: qty 1

## 2020-08-28 MED ORDER — FENTANYL CITRATE (PF) 100 MCG/2ML IJ SOLN
INTRAMUSCULAR | Status: AC
  Filled 2020-08-28: qty 2

## 2020-08-28 MED ORDER — FENTANYL CITRATE (PF) 100 MCG/2ML IJ SOLN
INTRAMUSCULAR | Status: DC | PRN
  Administered 2020-08-28: 15 ug via INTRATHECAL

## 2020-08-28 MED ORDER — SODIUM CHLORIDE 0.9 % IV SOLN
INTRAVENOUS | Status: DC | PRN
  Administered 2020-08-28: 250 mL via INTRAVENOUS

## 2020-08-28 MED ORDER — NALBUPHINE HCL 10 MG/ML IJ SOLN
5.0000 mg | INTRAMUSCULAR | Status: DC | PRN
Start: 2020-08-28 — End: 2020-09-01
  Filled 2020-08-28: qty 0.5

## 2020-08-28 MED ORDER — BUPIVACAINE HCL (PF) 0.25 % IJ SOLN
INTRAMUSCULAR | Status: DC | PRN
  Administered 2020-08-28: 20 mL

## 2020-08-28 MED ORDER — OXYCODONE-ACETAMINOPHEN 5-325 MG PO TABS: 1.0000 | ORAL_TABLET | ORAL | Status: DC | PRN

## 2020-08-28 MED ORDER — NALBUPHINE HCL 10 MG/ML IJ SOLN
5.0000 mg | Freq: Once | INTRAMUSCULAR | Status: DC | PRN
Start: 2020-08-28 — End: 2020-08-28

## 2020-08-28 MED ORDER — FUROSEMIDE 10 MG/ML IJ SOLN
40.0000 mg | Freq: Once | INTRAMUSCULAR | Status: AC
  Administered 2020-08-28: 40 mg via INTRAVENOUS
  Filled 2020-08-28: qty 4

## 2020-08-28 MED ORDER — SIMETHICONE 80 MG PO CHEW
80.0000 mg | CHEWABLE_TABLET | Freq: Every day | ORAL | Status: DC
  Administered 2020-08-28 – 2020-09-01 (×5): 80 mg via ORAL
  Filled 2020-08-28 (×5): qty 1

## 2020-08-28 MED ORDER — FENTANYL CITRATE (PF) 250 MCG/5ML IJ SOLN
INTRAMUSCULAR | Status: AC
  Filled 2020-08-28: qty 5

## 2020-08-28 MED ORDER — BUPIVACAINE IN DEXTROSE 0.75-8.25 % IT SOLN
INTRATHECAL | Status: DC | PRN
  Administered 2020-08-28: 1.2 mL via INTRATHECAL

## 2020-08-28 MED ORDER — NALOXONE HCL 4 MG/10ML IJ SOLN
1.0000 ug/kg/h | INTRAVENOUS | Status: DC | PRN
  Filled 2020-08-28: qty 5

## 2020-08-28 MED ORDER — TETANUS-DIPHTH-ACELL PERTUSSIS 5-2.5-18.5 LF-MCG/0.5 IM SUSY
0.5000 mL | PREFILLED_SYRINGE | Freq: Once | INTRAMUSCULAR | Status: DC
  Filled 2020-08-28 (×2): qty 0.5

## 2020-08-28 MED ORDER — SODIUM CHLORIDE 0.9% FLUSH: 3.0000 mL | INTRAVENOUS | Status: DC | PRN

## 2020-08-28 MED ORDER — MEPERIDINE HCL 25 MG/ML IJ SOLN: 6.2500 mg | INTRAMUSCULAR | Status: DC | PRN

## 2020-08-28 MED ORDER — ONDANSETRON HCL 4 MG/2ML IJ SOLN: 4.0000 mg | Freq: Once | INTRAMUSCULAR | Status: DC | PRN

## 2020-08-28 MED ORDER — MAGNESIUM SULFATE 2 GM/50ML IV SOLN
2.0000 g | Freq: Once | INTRAVENOUS | Status: AC
  Administered 2020-08-28: 2 g via INTRAVENOUS
  Filled 2020-08-28: qty 50

## 2020-08-28 SURGICAL SUPPLY — 43 items
APL PRP STRL LF DISP 70% ISPRP (MISCELLANEOUS) ×1
CANISTER WOUNDNEG PRESSURE 500 (CANNISTER) ×1 IMPLANT
CHLORAPREP W/TINT 26 (MISCELLANEOUS) ×2 IMPLANT
CLAMP CORD UMBIL (MISCELLANEOUS) IMPLANT
COVER WAND RF STERILE (DRAPES) ×2 IMPLANT
DRESSING PREVENA PLUS CUSTOM (GAUZE/BANDAGES/DRESSINGS) IMPLANT
DRSG OPSITE POSTOP 4X10 (GAUZE/BANDAGES/DRESSINGS) ×2 IMPLANT
DRSG PREVENA PLUS CUSTOM (GAUZE/BANDAGES/DRESSINGS) ×2
ELECT REM PT RETURN 9FT ADLT (ELECTROSURGICAL) ×2
ELECTRODE REM PT RTRN 9FT ADLT (ELECTROSURGICAL) ×1 IMPLANT
EXTRACTOR VACUUM M CUP 4 TUBE (SUCTIONS) IMPLANT
GLOVE BIO SURGEON STRL SZ7.5 (GLOVE) ×2 IMPLANT
GLOVE BIOGEL PI IND STRL 7.0 (GLOVE) ×1 IMPLANT
GLOVE BIOGEL PI INDICATOR 7.0 (GLOVE) ×1
GOWN STRL REUS W/ TWL LRG LVL3 (GOWN DISPOSABLE) ×2 IMPLANT
GOWN STRL REUS W/TWL LRG LVL3 (GOWN DISPOSABLE) ×4
KIT ABG SYR 3ML LUER SLIP (SYRINGE) ×2 IMPLANT
KIT TURNOVER KIT B (KITS) ×2 IMPLANT
MAT PREVALON FULL STRYKER (MISCELLANEOUS) ×1 IMPLANT
NDL HYPO 25X5/8 SAFETYGLIDE (NEEDLE) IMPLANT
NDL SPNL 20GX3.5 QUINCKE YW (NEEDLE) IMPLANT
NEEDLE HYPO 22GX1.5 SAFETY (NEEDLE) ×2 IMPLANT
NEEDLE HYPO 25X5/8 SAFETYGLIDE (NEEDLE) ×4 IMPLANT
NEEDLE SPNL 20GX3.5 QUINCKE YW (NEEDLE) IMPLANT
NS IRRIG 1000ML POUR BTL (IV SOLUTION) ×2 IMPLANT
PACK C SECTION WH (CUSTOM PROCEDURE TRAY) ×2 IMPLANT
PENCIL SMOKE EVACUATOR (MISCELLANEOUS) ×2 IMPLANT
SUT MNCRL 0 VIOLET CTX 36 (SUTURE) ×2 IMPLANT
SUT MNCRL AB 3-0 PS2 27 (SUTURE) IMPLANT
SUT MON AB 2-0 CT1 27 (SUTURE) ×2 IMPLANT
SUT MON AB-0 CT1 36 (SUTURE) ×4 IMPLANT
SUT MONOCRYL 0 CTX 36 (SUTURE) ×4
SUT PLAIN 0 NONE (SUTURE) IMPLANT
SUT PLAIN 2 0 (SUTURE)
SUT PLAIN 2 0 XLH (SUTURE) IMPLANT
SUT PLAIN ABS 2-0 CT1 27XMFL (SUTURE) IMPLANT
SUT VIC AB 0 CT1 18XCR BRD8 (SUTURE) IMPLANT
SUT VIC AB 0 CT1 8-18 (SUTURE) ×2
SYR 20ML LL LF (SYRINGE) IMPLANT
SYR CONTROL 10ML LL (SYRINGE) ×2 IMPLANT
TOWEL GREEN STERILE FF (TOWEL DISPOSABLE) ×2 IMPLANT
TRAY FOLEY W/BAG SLVR 14FR (SET/KITS/TRAYS/PACK) ×2 IMPLANT
WATER STERILE IRR 1000ML POUR (IV SOLUTION) ×2 IMPLANT

## 2020-08-28 NOTE — Anesthesia Procedure Notes (Signed)
Epidural Patient location during procedure: OR Start time: 08/28/2020 9:53 AM End time: 08/28/2020 9:57 AM  Staffing Anesthesiologist: Atilano Median, DO Performed: anesthesiologist   Preanesthetic Checklist Completed: patient identified, IV checked, site marked, risks and benefits discussed, surgical consent, monitors and equipment checked, pre-op evaluation and timeout performed  Epidural Patient position: sitting Prep: ChloraPrep Patient monitoring: heart rate, continuous pulse ox and blood pressure Approach: midline Location: L3-L4 Injection technique: LOR saline  Needle:  Needle type: Tuohy  Needle gauge: 17 G Needle length: 9 cm Needle insertion depth: 8 cm Catheter type: closed end flexible Catheter size: 20 Guage Catheter at skin depth: 12 cm Test dose: negative and 1.5% lidocaine  Assessment Sensory level: T5 Events: blood not aspirated, injection not painful, no injection resistance and no paresthesia  Additional Notes Patient identified. Risks/Benefits/Options discussed with patient including but not limited to bleeding, infection, nerve damage, paralysis, failed block, incomplete pain control, headache, blood pressure changes, nausea, vomiting, reactions to medications, itching and postpartum back pain. Confirmed with bedside nurse the patient's most recent platelet count. Confirmed with patient that they are not currently taking any anticoagulation, have any bleeding history or any family history of bleeding disorders. Patient expressed understanding and wished to proceed. All questions were answered. Sterile technique was used throughout the entire procedure. Please see nursing notes for vital signs. Test dose was given through epidural catheter and negative prior to continuing to dose epidural or start infusion. Warning signs of high block given to the patient including shortness of breath, tingling/numbness in hands, complete motor block, or any concerning  symptoms with instructions to call for help. Patient was given instructions on fall risk and not to get out of bed. All questions and concerns addressed with instructions to call with any issues or inadequate analgesia.    Reason for block:procedure for pain

## 2020-08-28 NOTE — Progress Notes (Signed)
[name] at bedside for NST. Pt attempting to rest but remains uncomfortable. Denies pain. No LOF, bleeding, or abdominal cramping. Fetal movement present. 155bpm baseline, moderate variability w/ 10x10 and 15x15, no decelerations present. Some UI noted. Abdomen soft to palpation.

## 2020-08-28 NOTE — Transfer of Care (Signed)
Immediate Anesthesia Transfer of Care Note  Patient: [name]  Procedure(s) Performed: CESAREAN SECTION (N/A )  Patient Location: PACU  Anesthesia Type:Spinal and Epidural  Level of Consciousness: awake, alert  and oriented  Airway & Oxygen Therapy: Patient Spontanous Breathing  Post-op Assessment: Report given to RN and Post -op Vital signs reviewed and stable  Post vital signs: Reviewed and stable  Last Vitals:  Vitals Value Taken Time  BP    Temp    Pulse 102 08/28/20 1157  Resp 20 08/28/20 1157  SpO2 100 % 08/28/20 1157  Vitals shown include unvalidated device data.  Last Pain:  Vitals:   08/28/20 0800  TempSrc:   PainSc: 0-No pain      Patients Stated Pain Goal: 0 (08/28/20 0656)  Complications: No complications documented.

## 2020-08-28 NOTE — Progress Notes (Signed)
Advanced Heart Failure Rounding Note  PCP-Cardiologist: Chilton Si, MD   Subjective:    Moved to ICU yesterday for cardiogenic shock with MV sat 40%. Initial CVP 21.  Now on milrinone 0.25. Co-ox up to 88% (?). Feels better. Diuresing well - over 4L out. Breathing better. CVP down to 14-15.   SBP 140-150s. Renal function improved.   Objective:   Weight Range: 131.9 kg Body mass index is 44.21 kg/m.   Vital Signs:   Temp:  [97.4 F (36.3 C)-98 F (36.7 C)] 97.8 F (36.6 C) (12/11 0700) Pulse Rate:  [97-112] 105 (12/11 0810) Resp:  [17-34] 24 (12/11 0810) BP: (106-151)/(68-95) 151/90 (12/11 0700) SpO2:  [98 %-100 %] 99 % (12/11 0810) Arterial Line BP: (116-174)/(82-97) 145/87 (12/11 0810) Weight:  [131.9 kg] 131.9 kg (12/11 0442) Last BM Date: 08/27/20 (per patient)  Weight change: Filed Weights   08/26/20 0639 08/27/20 0608 08/28/20 0442  Weight: 131.4 kg 131.9 kg 131.9 kg    Intake/Output:   Intake/Output Summary (Last 24 hours) at 08/28/2020 0839 Last data filed at 08/28/2020 0800 Gross per 24 hour  Intake 339.74 ml  Output 4425 ml  Net -4085.26 ml      Physical Exam    General:  Sitting up in bed. No resp difficulty HEENT: normal Neck: supple. JVP 15 Carotids 2+ bilat; no bruits. No lymphadenopathy or thryomegaly appreciated. Cor: PMI nondisplaced. Regular tachy No rubs, gallops or murmurs. Lungs: clear Abdomen: obese gravid soft, nontender, nondistended. No hepatosplenomegaly. No bruits or masses. Good bowel sounds. Extremities: no cyanosis, clubbing, rash, 1+ edema Neuro: alert & orientedx3, cranial nerves grossly intact. moves all 4 extremities w/o difficulty. Affect pleasant   Telemetry   Sinus 100-115 Personally reviewed   Labs    CBC Recent Labs    08/28/20 0332  WBC 15.8*  NEUTROABS 12.0*  HGB 10.3*  HCT 32.2*  MCV 75.6*  PLT 292   Basic Metabolic Panel Recent Labs    19/62/22 0554 08/28/20 0332  NA 135 137  K  3.9 3.5  CL 103 104  CO2 21* 22  GLUCOSE 120* 143*  BUN 20 20  CREATININE 1.13* 0.92  CALCIUM 8.7* 8.6*  MG  --  1.5*   Liver Function Tests No results for input(s): AST, ALT, ALKPHOS, BILITOT, PROT, ALBUMIN in the last 72 hours. No results for input(s): LIPASE, AMYLASE in the last 72 hours. Cardiac Enzymes No results for input(s): CKTOTAL, CKMB, CKMBINDEX, TROPONINI in the last 72 hours.  BNP: BNP (last 3 results) Recent Labs    08/24/20 1242  BNP 241.0*    ProBNP (last 3 results) No results for input(s): PROBNP in the last 8760 hours.   D-Dimer No results for input(s): DDIMER in the last 72 hours. Hemoglobin A1C Recent Labs    08/26/20 0740  HGBA1C 5.4   Fasting Lipid Panel No results for input(s): CHOL, HDL, LDLCALC, TRIG, CHOLHDL, LDLDIRECT in the last 72 hours. Thyroid Function Tests No results for input(s): TSH, T4TOTAL, T3FREE, THYROIDAB in the last 72 hours.  Invalid input(s): FREET3  Other results:   Imaging    [place] EKG SITE RITE  Result Date: 08/27/2020 If Site Rite image not attached, placement could not be confirmed due to current cardiac rhythm.    Medications:     Scheduled Medications: . aspirin  81 mg Oral QHS  . budesonide  0.25 mg Nebulization BID  . Chlorhexidine Gluconate Cloth  6 each Topical Daily  . digoxin  0.125  mg Oral Daily  . docusate sodium  100 mg Oral Daily  . heparin injection (subcutaneous)  10,000 Units Subcutaneous Q12H  . hydrALAZINE  50 mg Oral Q8H  . insulin aspart  0-12 Units Subcutaneous TID PC  . insulin glargine  18 Units Subcutaneous QHS  . isosorbide dinitrate  10 mg Oral TID  . prenatal multivitamin  1 tablet Oral Q1200  . sodium chloride flush  10-40 mL Intracatheter Q12H  . valACYclovir  1,000 mg Oral QHS  . [START ON 08/31/2020] Vitamin D (Ergocalciferol)  50,000 Units Oral Q7 days    Infusions: . sodium chloride 250 mL (08/28/20 0835)  . magnesium sulfate bolus IVPB 2 g (08/28/20 0837)  .  milrinone 0.25 mcg/kg/min (08/28/20 0600)    PRN Medications: sodium chloride, acetaminophen, albuterol, calcium carbonate, hydrALAZINE, sodium chloride flush, zolpidem    Assessment/Plan   1. Acute Biventricular Heart Failure  -> cardiogenic shock - Echo this admit severely reduced EF 15-20% . Possible HTN CM - developed shock physiology on 12/10. MV sat 40%. Moved to ICU - now on milrinone 0.25. outputs improved - volume status improving but still overloaded. Continue diuresis - d/w OB, anesthesia teams. Plan for C-section today.  - continue hydralazine/nitrates and dig. Off b-blocker due to shock - post-delivery add Entresto. And spiro. She will not be able to breastfeed - move to [facility] post-delivery and monitor for 48 hours. Mechanical support available as needed  2. HTN - treatment as above  3. AKI - due to ATN.cardiorenal - improved with hemodynamic support  4. Hypokalemia/hypomag - supped  5. DM2 - SSI  6. OSA - Recently diagnosed. CPAP   7. H/O Asthma.   CRITICAL CARE Performed by: Arvilla Meres  Total critical care time: 45 minutes  Critical care time was exclusive of separately billable procedures and treating other patients.  Critical care was necessary to treat or prevent imminent or life-threatening deterioration.  Critical care was time spent personally by me (independent of midlevel providers or residents) on the following activities: development of treatment plan with patient and/or surrogate as well as nursing, discussions with consultants, evaluation of patient's response to treatment, examination of patient, obtaining history from patient or surrogate, ordering and performing treatments and interventions, ordering and review of laboratory studies, ordering and review of radiographic studies, pulse oximetry and re-evaluation of patient's condition.    Length of Stay: 4  Arvilla Meres, MD  08/28/2020, 8:39 AM  Advanced Heart Failure  Team Pager [phone] (M-F; 7a - 4p)  Please contact [facility] for night-coverage after hours (4p -7a ) and weekends on [email or web address]

## 2020-08-28 NOTE — Op Note (Signed)
Cesarean Section Procedure Note  Indications: malpresentation: footling breech and worsening maternal cardiac status  Pre-operative Diagnosis: 31 week 1 day pregnancy.  Post-operative Diagnosis: same  Surgeon: Lenoard Aden   Assistants: Renae Fickle, CNM  Anesthesia: Epidural anesthesia and Spinal anesthesia  ASA Class: 2  Procedure Details  The patient was seen in the Holding Room. The risks, benefits, complications, treatment options, and expected outcomes were discussed with the patient.  The patient concurred with the proposed plan, giving informed consent. The risks of anesthesia, infection, bleeding and possible injury to other organs discussed. Injury to bowel, bladder, or ureter with possible need for repair discussed. Possible need for transfusion with secondary risks of hepatitis or HIV acquisition discussed. Post operative complications to include but not limited to DVT, PE and Pneumonia noted. The site of surgery properly noted/marked. The patient was taken to Operating Room # 14, identified as MIRREN GEST and the procedure verified as C-Section Delivery. A Time Out was held and the above information confirmed.  After induction of anesthesia, the patient was draped and prepped in the usual sterile manner. A Pfannenstiel incision was made and carried down through the subcutaneous tissue to the fascia. Fascial incision was made and extended transversely using Mayo scissors. The fascia was separated from the underlying rectus tissue superiorly and inferiorly. The peritoneum was identified and entered. Peritoneal incision was extended longitudinally.  A low transverse uterine incision(Kerr hysterotomy) to mid body uterus J shape incsion was made. Delivered from footling breech presentation was a  female with Apgar scores of 8 at one minute and 9 at five minutes. Bulb suctioning gently performed. Neonatal team in attendance.After the umbilical cord was clamped and cut cord blood was  obtained for evaluation. The placenta was removed intact and appeared normal. The uterus was curetted with a dry lap pack. Good hemostasis was noted.The uterine outline, tubes and ovaries appeared normal. The uterine incision was closed with running locked sutures of 0 Monocryl x 2 layers. Hemostasis was observed.The parietal peritoneum was closed with a running 2-0 Monocryl suture. The fascia was then reapproximated with running sutures of 0 Monocryl. The skin was reapproximated with 4-0 vicryl after [employer] closure with 2-0 plain.  Instrument, sponge, and needle counts were correct prior the abdominal closure and at the conclusion of the case.   Findings: Premature female, footling breech, anterior placenta  Estimated Blood Loss:  600         Drains: foley                 Specimens: placenta                 Complications:  None; patient tolerated the procedure well.         Disposition: PACU - hemodynamically stable.         Condition: stable  Attending Attestation: I performed the procedure.

## 2020-08-28 NOTE — Progress Notes (Addendum)
  Accompanied patient to OR for high-risk pre-term C-section.  ECMO team and cardiac surgery team aware and on standby. ECMO equipment in room.   Patient on milrinone 0.25 at beginning of case. Started on neosynephrine and maintained on this throguhtout case with constant arterial monitoring in conjunction with Dr. Nance Pew and anesthesia team.   SBPs maintained in the 120s. IVFs minimized with near even I/Os throughout the case. Sats 95-96% throughout case. HR maintained 95-105.   C-section uneventful.   Patient transferred to [facility] personally with OR team and Anesthesia CRNA.   Neo weaned off in CCU. Remains on milrinone 0.25. Co-ox pending CVP checked personally 15-16.   Will continue milrinone. Hold diuresis for now. Continue oral hydralazine/nitrates and digoxin. Add Entresto in am with slow wean of milrinone. Monitor in ICU for 24-48 hours.   Additional CCT 90 mins  Arvilla Meres, MD  12:09 PM

## 2020-08-28 NOTE — Progress Notes (Signed)
Inpatient Diabetes Program Recommendations  AACE/ADA: New Consensus Statement on Inpatient Glycemic Control (2015)  Target Ranges:  Prepandial:   less than 140 mg/dL      Peak postprandial:   less than 180 mg/dL (1-2 hours)      Critically ill patients:  140 - 180 mg/dL   Results for ISSA, LUSTER (MRN [id number]) as of 08/28/2020 15:35  Ref. Range 08/28/2020 03:39 08/28/2020 07:39 08/28/2020 15:17  Glucose-Capillary Latest Ref Range: 70 - 99 mg/dL 451 (H) 460 (H) 97   Diabetes history:GDM  Outpatient DM meds:Lantus 32 units QHS  Current orders: Lantus 18 units QHS     Novolog 0-12 units TID after meals for post-prandial coverage     MD- Note patient transferred to ICU and baby delivered via C-section this AM.  Note that pt received 18 units Lantus last PM.  Since she has delivered, expect her Insulin needs to drastically reduce.  Can also switch her to Q4 hour CBG checks now that she has delivered and remains NPO.  Please consider the following:  1. May want to d/c the Lantus for now  2. Would change the current Novolog SSi to the Sensitive scale (0-9 units) Q4 hours per the Glycemic control order set     --Will follow patient during hospitalization--  Ambrose Finland RN, MSN, CDE Diabetes Coordinator Inpatient Glycemic Control Team Team Pager: [phone] (8a-5p)

## 2020-08-28 NOTE — Anesthesia Procedure Notes (Signed)
Spinal  Patient location during procedure: OR Start time: 08/28/2020 9:56 AM End time: 08/28/2020 9:57 AM Staffing Performed: anesthesiologist  Anesthesiologist: Atilano Median, DO Preanesthetic Checklist Completed: patient identified, IV checked, site marked, risks and benefits discussed, surgical consent, monitors and equipment checked, pre-op evaluation and timeout performed Spinal Block Patient position: sitting Prep: DuraPrep Patient monitoring: heart rate, cardiac monitor, continuous pulse ox and blood pressure Approach: midline Location: L3-4 Injection technique: single-shot Needle Needle type: Pencan  Needle gauge: 25 G Needle length: 12.7 cm Additional Notes Patient identified. Risks/Benefits/Options discussed with patient including but not limited to bleeding, infection, nerve damage, paralysis, failed block, incomplete pain control, headache, blood pressure changes, nausea, vomiting, reactions to medications, itching and postpartum back pain. Confirmed with bedside nurse the patient's most recent platelet count. Confirmed with patient that they are not currently taking any anticoagulation, have any bleeding history or any family history of bleeding disorders. Patient expressed understanding and wished to proceed. All questions were answered. Sterile technique was used throughout the entire procedure. Please see nursing notes for vital signs. Warning signs of high block given to the patient including shortness of breath, tingling/numbness in hands, complete motor block, or any concerning symptoms with instructions to call for help. Patient was given instructions on fall risk and not to get out of bed. All questions and concerns addressed with instructions to call with any issues or inadequate analgesia.

## 2020-08-28 NOTE — Lactation Note (Signed)
This note was copied from a baby's chart. Lactation Consultation Note  Patient Name: [name] [id number] Date: 08/28/2020  Baby Kelli Degenhart now 4 hours old born urgent via c section at [redacted] weeks gestation.  Baby Kelli Luddy conceived IVF. Mom is currently on many medications.  Mom inquired about medications.  Let her know the usual ones for labor and delivery are safe that LC would need to check the reset of the medications.  Started to initiate pumping with mom.  Mom started vomiting. After a few minutes she said she was good.  Due to RN working on her IV, initiated pumping with DEBP one side at a time.  Reviewed how to wash pump parts with both mom and RN. Urged mom to pump 8 times day.  According to Herma Ard Medications and Mothers milk book 2021: Labetolol, Procradia, Aspirin, Digoxin,Heparin,Hydralizine, Toradol, Nubain, are all L2 medications. Isordil and Narcan are L3.   Primacor is an L4 which is not compatible with breastfeeding.  However this medication is usually temporary and not given longer than 48 hours.  Mom still may not be getting milk with pumping by that time.  Lactation to follow up with mom.   Maternal Data    Feeding    LATCH Score                   Interventions    Lactation Tools Discussed/Used     Consult Status      [name] 08/28/2020, 4:26 PM

## 2020-08-28 NOTE — Progress Notes (Signed)
0209[name] at bedside for NST. Patient attempting to rest but visibly uncomfortable. Denies pain, just "uncomfortable with all these IV's". No LOF, bleeding, or abdominal cramping. Fetal movement present. 150bpm baseline, moderate to minimum variability, no decelerations present. No UC noted. Abdomen soft to palpation.

## 2020-08-29 ENCOUNTER — Encounter (HOSPITAL_COMMUNITY): Payer: Self-pay | Admitting: Obstetrics and Gynecology

## 2020-08-29 DIAGNOSIS — R570 Cardiogenic shock: Secondary | ICD-10-CM

## 2020-08-29 LAB — MAGNESIUM: Magnesium: 2 mg/dL (ref 1.7–2.4)

## 2020-08-29 LAB — GLUCOSE, CAPILLARY
Glucose-Capillary: 105 mg/dL — ABNORMAL HIGH (ref 70–99)
Glucose-Capillary: 126 mg/dL — ABNORMAL HIGH (ref 70–99)
Glucose-Capillary: 132 mg/dL — ABNORMAL HIGH (ref 70–99)
Glucose-Capillary: 134 mg/dL — ABNORMAL HIGH (ref 70–99)
Glucose-Capillary: 150 mg/dL — ABNORMAL HIGH (ref 70–99)
Glucose-Capillary: 190 mg/dL — ABNORMAL HIGH (ref 70–99)

## 2020-08-29 LAB — CBC
HCT: 31.5 % — ABNORMAL LOW (ref 36.0–46.0)
Hemoglobin: 9.6 g/dL — ABNORMAL LOW (ref 12.0–15.0)
MCH: 23.4 pg — ABNORMAL LOW (ref 26.0–34.0)
MCHC: 30.5 g/dL (ref 30.0–36.0)
MCV: 76.8 fL — ABNORMAL LOW (ref 80.0–100.0)
Platelets: 243 10*3/uL (ref 150–400)
RBC: 4.1 MIL/uL (ref 3.87–5.11)
RDW: 16.3 % — ABNORMAL HIGH (ref 11.5–15.5)
WBC: 17.4 10*3/uL — ABNORMAL HIGH (ref 4.0–10.5)
nRBC: 0.3 % — ABNORMAL HIGH (ref 0.0–0.2)

## 2020-08-29 LAB — BASIC METABOLIC PANEL
Anion gap: 9 (ref 5–15)
BUN: 17 mg/dL (ref 6–20)
CO2: 22 mmol/L (ref 22–32)
Calcium: 8.7 mg/dL — ABNORMAL LOW (ref 8.9–10.3)
Chloride: 104 mmol/L (ref 98–111)
Creatinine, Ser: 0.95 mg/dL (ref 0.44–1.00)
GFR, Estimated: >60 mL/min (ref >60)
Glucose, Bld: 116 mg/dL — ABNORMAL HIGH (ref 70–99)
Potassium: 4.1 mmol/L (ref 3.5–5.1)
Sodium: 135 mmol/L (ref 135–145)

## 2020-08-29 LAB — COOXEMETRY PANEL
Carboxyhemoglobin: 1.9 % — ABNORMAL HIGH (ref 0.5–1.5)
Carboxyhemoglobin: 1.8 % — ABNORMAL HIGH (ref 0.5–1.5)
Methemoglobin: 0.7 % (ref 0.0–1.5)
Methemoglobin: 0.6 % (ref 0.0–1.5)
O2 Saturation: 98.2 %
O2 Saturation: 79.5 %
Total hemoglobin: 9.8 g/dL — ABNORMAL LOW (ref 12.0–16.0)
Total hemoglobin: 9.4 g/dL — ABNORMAL LOW (ref 12.0–16.0)

## 2020-08-29 MED ORDER — VALACYCLOVIR HCL 500 MG PO TABS
1000.0000 mg | ORAL_TABLET | Freq: Every day | ORAL | Status: DC
  Administered 2020-08-29 – 2020-08-31 (×3): 1000 mg via ORAL
  Filled 2020-08-29 (×5): qty 2

## 2020-08-29 MED ORDER — ACETAMINOPHEN 500 MG PO TABS
1000.0000 mg | ORAL_TABLET | Freq: Four times a day (QID) | ORAL | Status: DC
  Administered 2020-08-29 – 2020-08-31 (×10): 1000 mg via ORAL
  Filled 2020-08-29 (×11): qty 2

## 2020-08-29 MED ORDER — OXYCODONE HCL 5 MG PO TABS: 5.0000 mg | ORAL_TABLET | ORAL | Status: DC | PRN

## 2020-08-29 MED ORDER — SACUBITRIL-VALSARTAN 24-26 MG PO TABS
1.0000 | ORAL_TABLET | Freq: Two times a day (BID) | ORAL | Status: DC
  Administered 2020-08-29 – 2020-09-01 (×7): 1 via ORAL
  Filled 2020-08-29 (×8): qty 1

## 2020-08-29 MED ORDER — INSULIN ASPART 100 UNIT/ML ~~LOC~~ SOLN
0.0000 [IU] | SUBCUTANEOUS | Status: DC
  Administered 2020-08-29 – 2020-08-31 (×7): 1 [IU] via SUBCUTANEOUS

## 2020-08-29 MED ORDER — POTASSIUM CHLORIDE CRYS ER 20 MEQ PO TBCR
40.0000 meq | EXTENDED_RELEASE_TABLET | Freq: Once | ORAL | Status: AC
  Administered 2020-08-29: 40 meq via ORAL
  Filled 2020-08-29: qty 2

## 2020-08-29 MED ORDER — FUROSEMIDE 10 MG/ML IJ SOLN
40.0000 mg | Freq: Two times a day (BID) | INTRAMUSCULAR | Status: DC
  Administered 2020-08-29 – 2020-08-30 (×4): 40 mg via INTRAVENOUS
  Filled 2020-08-29 (×5): qty 4

## 2020-08-29 MED ORDER — IBUPROFEN 200 MG PO TABS
600.0000 mg | ORAL_TABLET | Freq: Four times a day (QID) | ORAL | Status: DC
  Administered 2020-08-29 – 2020-09-01 (×12): 600 mg via ORAL
  Filled 2020-08-29 (×12): qty 3

## 2020-08-29 NOTE — Progress Notes (Signed)
Patient stated that she is nauseous and will be unable to wear CPAP for the evening

## 2020-08-29 NOTE — Progress Notes (Addendum)
Subjective: POD# 1 Live born female  Birth Weight: 3 lb 6.3 oz (1540 g) APGAR: 8, 9  Newborn Delivery   Birth date/time: 08/28/2020 10:26:00 Delivery type: C-Section, Low Transverse Trial of labor: No C-section categorization: Primary    NICU admit / prematurity - stable Baby name: Arayah Delivering provider: Olivia Mackie    Feeding: desires to breastfeed  Pain control at delivery: Spinal   Reports feeling well today, up in chair at [facility].  Patient reports tolerating PO.   Breast symptoms: none Pain controlled with Toradol and Percocet Denies HA/SOB/C/P/N/V/dizziness. Flatus minimal. She reports vaginal bleeding as normal, without clots.  She has ambulated to [facility] for care, foley cath still in place.  Objective:   VS:    Vitals:   08/29/20 0600 08/29/20 0700 08/29/20 0739 08/29/20 0800  BP:    134/86  Pulse: (!) 118 (!) 106  (!) 118  Resp: 18 13  20   Temp:   (!) 97.5 F (36.4 C)   TempSrc:   Oral   SpO2: 98% 99%  100%  Weight:      Height:          Intake/Output Summary (Last 24 hours) at 08/29/2020 1152 Last data filed at 08/29/2020 0800 Gross per 24 hour  Intake 411.15 ml  Output 850 ml  Net -438.85 ml        Recent Labs    08/28/20 1520 08/29/20 0500  WBC 20.9* 17.4*  HGB 10.4* 9.6*  HCT 34.3* 31.5*  PLT 271 243     Blood type: --/--/O POS (12/07 1400)  Rubella:   immune   Physical Exam:  General: alert and no distress Abdomen: soft, nontender, normal bowel sounds Incision: clean, dry, intact and Prevena dressing patent Uterine Fundus: firm, below umbilicus, nontender Lochia: small Ext: edema +1 pedal and pretibial and no cords or calf tenderness  Assessment/Plan: 39 y.o.   POD# 1. 24                  Active Problems:   Morbid obesity (HCC)   White classification A2 gestational diabetes mellitus (GDM), insulin controlled   Shortness of breath   Pulmonary vascular congestion   Acute on chronic diastolic congestive heart failure  (HCC)   Edema   Acute combined systolic and diastolic heart failure, NYHA class 4 (HCC)   Essential hypertension   OSA (obstructive sleep apnea)   Cesarean delivery - 31 wks/cardiac dz   Postpartum care following cesarean delivery 08/28/2020   Doing well, stable post-op CV status - per cardiology service GDM management per diabetes coordinator, off Lantus and now SSi Novolog Anticoagulation prophylactic - heparin discontinued prior to C/S and resumed post-op, may DC today.          Advance diet as tolerated Breastfeeding support - patient desires to breastfeed, advised to discuss w/ cardiology service possibility of transitioning to breastfeeding safe medication, may "pump and dump" in meantime if possibility for other meds within next few weeks. DC Foley cath this afternoon. Visiting NICU today via WC Encourage to ambulate in hallway daily, warm fluids  to increase gut motility Routine post-op care  POC in consult w/ Dr. 14/07/2020, CNM, MSN 08/29/2020, 11:52 AM

## 2020-08-29 NOTE — Plan of Care (Signed)

## 2020-08-29 NOTE — Progress Notes (Signed)
Nausea last night, none now, able to tol po. Up to walk and void today. + flatus but no BM yet. Notes dysuria but no other significant post-op pain.  Breathing better. Using tylenol and motrin for pain control  O: well appearing, cheerful 4+ LE edema Inc- dressed, wound vac on Abd: obese appropriately tender, ND  A/P: POD#1 PCS at 31 wks for worsening heart failure/ cardiogenic shock - post-op recovering well, bowel function returning. Cont pain meds, expect more pain tonight vs last night as spinal meds have worn off. No breast stimulation, will treat engorgement prn.  - dysuria, likely surgical pain, low risk for UTI but will check UCx. UA not valid given lochia, unable to do clean catch. - primary management by heart failure team.  Lendon Colonel 08/29/2020 7:03 PM

## 2020-08-29 NOTE — Plan of Care (Signed)
  Problem: Education: Goal: Knowledge of General Education information will improve Description: Including pain rating scale, medication(s)/side effects and non-pharmacologic comfort measures Outcome: Progressing   Problem: Health Behavior/Discharge Planning: Goal: Ability to manage health-related needs will improve Outcome: Progressing   Problem: Clinical Measurements: Goal: Ability to maintain clinical measurements within normal limits will improve Outcome: Progressing Goal: Will remain free from infection Outcome: Progressing Goal: Diagnostic test results will improve Outcome: Progressing Goal: Respiratory complications will improve Outcome: Progressing Goal: Cardiovascular complication will be avoided Outcome: Progressing   Problem: Activity: Goal: Risk for activity intolerance will decrease Outcome: Progressing   Problem: Nutrition: Goal: Adequate nutrition will be maintained Outcome: Progressing   Problem: Coping: Goal: Level of anxiety will decrease Outcome: Progressing   Problem: Elimination: Goal: Will not experience complications related to bowel motility Outcome: Progressing Goal: Will not experience complications related to urinary retention Outcome: Progressing   Problem: Pain Managment: Goal: General experience of comfort will improve Outcome: Progressing   Problem: Safety: Goal: Ability to remain free from injury will improve Outcome: Progressing   Problem: Skin Integrity: Goal: Risk for impaired skin integrity will decrease Outcome: Progressing   Problem: Education: Goal: Knowledge of disease or condition will improve Outcome: Progressing Goal: Knowledge of the prescribed therapeutic regimen will improve Outcome: Progressing Goal: Individualized Educational Video(s) Outcome: Progressing   Problem: Clinical Measurements: Goal: Complications related to the disease process, condition or treatment will be avoided or minimized Outcome:  Progressing   Problem: Education: Goal: Knowledge of disease or condition will improve Outcome: Progressing Goal: Knowledge of the prescribed therapeutic regimen will improve Outcome: Progressing   Problem: Fluid Volume: Goal: Peripheral tissue perfusion will improve Outcome: Progressing   Problem: Clinical Measurements: Goal: Complications related to disease process, condition or treatment will be avoided or minimized Outcome: Progressing

## 2020-08-29 NOTE — Progress Notes (Signed)
Patient transported to NICU to visit her baby with SWOT nurse at 1300. Patient returned at 1400 report received. Vital signs remained stable. Patient resting comfortably in chair.

## 2020-08-29 NOTE — Progress Notes (Signed)
Inpatient Diabetes Program Recommendations  AACE/ADA: New Consensus Statement on Inpatient Glycemic Control (2015)  Target Ranges:  Prepandial:   less than 140 mg/dL      Peak postprandial:   less than 180 mg/dL (1-2 hours)      Critically ill patients:  140 - 180 mg/dL   Results for [name], [name] (MRN [id number]) as of 08/29/2020 10:17  Ref. Range 08/28/2020 15:17 08/28/2020 22:09 08/29/2020 00:38 08/29/2020 05:00 08/29/2020 06:54  Glucose-Capillary Latest Ref Range: 70 - 99 mg/dL 97 76 426 (H) 834 (H) 196 (H)    Diabetes history:GDM  Outpatient DM meds:Lantus 32 units QHS  Current orders: Novolog 0-12 units TID after meals for post-prandial coverage     MD- Since pt has delivered, expect her Insulin needs to drastically reduce.  Note that the Lantus was d/c'd as requested.  Would change the current Novolog SSi to the Sensitive scale (0-9 units) Q4 hours per the Glycemic control order set  Pt no longer needs CBG checked 2 hours post-meals since she has delivered   --Will follow patient during hospitalization--  Ambrose Finland RN, MSN, CDE Diabetes Coordinator Inpatient Glycemic Control Team Team Pager: [phone] (8a-5p)

## 2020-08-29 NOTE — Lactation Note (Signed)
This note was copied from a baby's chart. Lactation Consultation Note  Patient Name: [name] [id number] Date: 08/29/2020 Reason for consult: Follow-up assessment;Mother's request;NICU baby  LC to Infant's NICU room for f/u consult. Mother visiting but currently admitted to C-ICU. Mother on medication not compatible with bf and unsure about length of time this med will be needed and/or alternative meds available. Multiple providers in room when [facility] arrived and mother's 1st visit to NICU to meet her baby. LC to f/u tomorrow.   Consult Status Consult Status: Follow-up Follow-up type: In-patient   Elder Negus, MSA IBCLC 08/29/2020, 1:29 PM

## 2020-08-29 NOTE — Progress Notes (Signed)
Patient ID: [name], female   DOB: [birth year], 39 y.o.   MRN: [id number]     Advanced Heart Failure Rounding Note  PCP-Cardiologist: Chilton Si, MD   Subjective:    - Moved to ICU for cardiogenic shock with MV sat 40%. Initial CVP 21. - Delivered via C-section 12/11.   Now on milrinone 0.25. Co-ox up to 79%. No complaints this morning, feels good.  SBP 130s (arterial line not accurate).   CVP 13-14.  ST around 120.  Creatinine stable.   Objective:   Weight Range: 131.9 kg Body mass index is 44.21 kg/m.   Vital Signs:   Temp:  [96.9 F (36.1 C)-98.4 F (36.9 C)] 97.5 F (36.4 C) (12/12 0739) Pulse Rate:  [90-119] 118 (12/12 0800) Resp:  [11-26] 20 (12/12 0800) BP: (118-134)/(86-96) 134/86 (12/12 0800) SpO2:  [94 %-100 %] 100 % (12/12 0800) Arterial Line BP: (80-147)/(63-126) 108/95 (12/12 0800) Last BM Date: 08/27/20  Weight change: Filed Weights   08/26/20 0639 08/27/20 0608 08/28/20 0442  Weight: 131.4 kg 131.9 kg 131.9 kg    Intake/Output:   Intake/Output Summary (Last 24 hours) at 08/29/2020 0941 Last data filed at 08/29/2020 0800 Gross per 24 hour  Intake 811.15 ml  Output 1450 ml  Net -638.85 ml      Physical Exam    General: NAD Neck: JVP 14 cm, no thyromegaly or thyroid nodule.  Lungs: Clear to auscultation bilaterally with normal respiratory effort. CV: Nondisplaced PMI.  Heart mildly tachy, regular S1/S2, no S3/S4, no murmur.  2+ edema to knees.  Abdomen: Soft, nontender, no hepatosplenomegaly, no distention.  Skin: Intact without lesions or rashes.  Neurologic: Alert and oriented x 3.  Psych: Normal affect. Extremities: No clubbing or cyanosis.  HEENT: Normal.    Telemetry   Sinus 110s-120s Personally reviewed   Labs    CBC Recent Labs    08/28/20 0332 08/28/20 1520 08/29/20 0500  WBC 15.8* 20.9* 17.4*  NEUTROABS 12.0* 17.6*  --   HGB 10.3* 10.4* 9.6*  HCT 32.2* 34.3* 31.5*  MCV 75.6* 77.1* 76.8*  PLT 292 271 243    Basic Metabolic Panel Recent Labs    67/89/38 0332 08/29/20 0500  NA 137 135  K 3.5 4.1  CL 104 104  CO2 22 22  GLUCOSE 143* 116*  BUN 20 17  CREATININE 0.92 0.95  CALCIUM 8.6* 8.7*  MG 1.5* 2.0   Liver Function Tests No results for input(s): AST, ALT, ALKPHOS, BILITOT, PROT, ALBUMIN in the last 72 hours. No results for input(s): LIPASE, AMYLASE in the last 72 hours. Cardiac Enzymes No results for input(s): CKTOTAL, CKMB, CKMBINDEX, TROPONINI in the last 72 hours.  BNP: BNP (last 3 results) Recent Labs    08/24/20 1242  BNP 241.0*    ProBNP (last 3 results) No results for input(s): PROBNP in the last 8760 hours.   D-Dimer No results for input(s): DDIMER in the last 72 hours. Hemoglobin A1C No results for input(s): HGBA1C in the last 72 hours. Fasting Lipid Panel No results for input(s): CHOL, HDL, LDLCALC, TRIG, CHOLHDL, LDLDIRECT in the last 72 hours. Thyroid Function Tests No results for input(s): TSH, T4TOTAL, T3FREE, THYROIDAB in the last 72 hours.  Invalid input(s): FREET3  Other results:   Imaging    No results found.   Medications:     Scheduled Medications: . aspirin  81 mg Oral QHS  . budesonide  0.25 mg Nebulization BID  . Chlorhexidine Gluconate Cloth  6 each  Topical Daily  . digoxin  0.125 mg Oral Daily  . docusate sodium  100 mg Oral Daily  . furosemide  40 mg Intravenous BID  . heparin injection (subcutaneous)  10,000 Units Subcutaneous Q12H  . hydrALAZINE  50 mg Oral Q8H  . insulin aspart  0-12 Units Subcutaneous TID PC  . isosorbide dinitrate  10 mg Oral TID  . potassium chloride  40 mEq Oral Once  . prenatal multivitamin  1 tablet Oral Q1200  . sacubitril-valsartan  1 tablet Oral BID  . scopolamine  1 patch Transdermal Once  . simethicone  80 mg Oral Daily  . sodium chloride flush  10-40 mL Intracatheter Q12H  . Tdap  0.5 mL Intramuscular Once  . valACYclovir  1,000 mg Oral QHS  . [START ON 08/31/2020] Vitamin D  (Ergocalciferol)  50,000 Units Oral Q7 days    Infusions: . sodium chloride Stopped (08/28/20 0837)  . milrinone 0.25 mcg/kg/min (08/29/20 0800)  . naLOXone [facility]) adult infusion for PRURITIS      PRN Medications: sodium chloride, acetaminophen, albuterol, calcium carbonate, coconut oil, witch hazel-glycerin **AND** dibucaine, diphenhydrAMINE **OR** diphenhydrAMINE, hydrALAZINE, nalbuphine **OR** nalbuphine, naloxone **AND** sodium chloride flush, naLOXone (NARCAN) adult infusion for PRURITIS, ondansetron (ZOFRAN) IV, oxyCODONE-acetaminophen, sodium chloride flush, zolpidem    Assessment/Plan   1. Acute Biventricular Heart Failure  -> cardiogenic shock - Echo this admit severely reduced EF 15-20% . Possible HTN CM - developed shock physiology on 12/10. MV sat 40%. Moved to ICU - C-section 12/11 without complications.  - now on milrinone 0.25, co-ox 79%.  Decrease to 0.125 today.  - Volume overloaded, will give Lasix 40 mg IV bid today and follow response.  - Unna boots.  - continue hydralazine/nitrates and dig. Off b-blocker due to shock.  - SBP in 130s with inaccurate arterial line, remove arterial line today.  - Add Entresto 24/26 bid.  - Monitor on 2H for now.   2. HTN - treatment as above  3. AKI - due to ATN, cardiorenal - improved with hemodynamic support  4. Hypokalemia/hypomag - supped  5. DM2 - SSI  6. OSA - Recently diagnosed. CPAP   7. H/O Asthma.   8. DVT prophylaxis: Heparin [place]  CRITICAL CARE Performed by: Marca Ancona  Total critical care time: 35 minutes  Critical care time was exclusive of separately billable procedures and treating other patients.  Critical care was necessary to treat or prevent imminent or life-threatening deterioration.  Critical care was time spent personally by me (independent of midlevel providers or residents) on the following activities: development of treatment plan with patient and/or surrogate as well as  nursing, discussions with consultants, evaluation of patient's response to treatment, examination of patient, obtaining history from patient or surrogate, ordering and performing treatments and interventions, ordering and review of laboratory studies, ordering and review of radiographic studies, pulse oximetry and re-evaluation of patient's condition.    Length of Stay: 5  Marca Ancona, MD  08/29/2020, 9:41 AM  Advanced Heart Failure Team Pager [phone] (M-F; 7a - 4p)  Please contact [facility] for night-coverage after hours (4p -7a ) and weekends on [email or web address]

## 2020-08-29 NOTE — Progress Notes (Signed)
Patient would like more information about medications that would be compatible for breast feeding if possible. Pharmacy made aware and will continue to follow up with MD and patient.

## 2020-08-29 NOTE — Anesthesia Postprocedure Evaluation (Signed)
Anesthesia Post Note  Patient: [name]  Procedure(s) Performed: CESAREAN SECTION (N/A )     Patient location during evaluation: SICU Anesthesia Type: Combined Spinal/Epidural Level of consciousness: awake and alert Pain management: pain level controlled Vital Signs Assessment: post-procedure vital signs reviewed and stable Cardiovascular status: stable Postop Assessment: no apparent nausea or vomiting Anesthetic complications: no   No complications documented.  Last Vitals:  Vitals:   08/29/20 1100 08/29/20 1200  BP: 118/70 123/63  Pulse:  (!) 116  Resp: 20 14  Temp:    SpO2:  99%    Last Pain:  Vitals:   08/29/20 0800  TempSrc:   PainSc: 0-No pain                 Earl Lites P Dardan Shelton

## 2020-08-30 ENCOUNTER — Encounter (HOSPITAL_COMMUNITY): Payer: Self-pay | Admitting: Obstetrics and Gynecology

## 2020-08-30 LAB — COOXEMETRY PANEL
Carboxyhemoglobin: 1.6 % — ABNORMAL HIGH (ref 0.5–1.5)
Methemoglobin: 0.7 % (ref 0.0–1.5)
O2 Saturation: 72.5 %
Total hemoglobin: 9.6 g/dL — ABNORMAL LOW (ref 12.0–16.0)

## 2020-08-30 LAB — CBC
HCT: 31.6 % — ABNORMAL LOW (ref 36.0–46.0)
Hemoglobin: 9.5 g/dL — ABNORMAL LOW (ref 12.0–15.0)
MCH: 23.4 pg — ABNORMAL LOW (ref 26.0–34.0)
MCHC: 30.1 g/dL (ref 30.0–36.0)
MCV: 77.8 fL — ABNORMAL LOW (ref 80.0–100.0)
Platelets: 226 10*3/uL (ref 150–400)
RBC: 4.06 MIL/uL (ref 3.87–5.11)
RDW: 16.3 % — ABNORMAL HIGH (ref 11.5–15.5)
WBC: 16.4 10*3/uL — ABNORMAL HIGH (ref 4.0–10.5)
nRBC: 0.2 % (ref 0.0–0.2)

## 2020-08-30 LAB — GLUCOSE, CAPILLARY
Glucose-Capillary: 106 mg/dL — ABNORMAL HIGH (ref 70–99)
Glucose-Capillary: 106 mg/dL — ABNORMAL HIGH (ref 70–99)
Glucose-Capillary: 109 mg/dL — ABNORMAL HIGH (ref 70–99)
Glucose-Capillary: 122 mg/dL — ABNORMAL HIGH (ref 70–99)
Glucose-Capillary: 123 mg/dL — ABNORMAL HIGH (ref 70–99)
Glucose-Capillary: 133 mg/dL — ABNORMAL HIGH (ref 70–99)
Glucose-Capillary: 149 mg/dL — ABNORMAL HIGH (ref 70–99)

## 2020-08-30 LAB — BASIC METABOLIC PANEL
Anion gap: 10 (ref 5–15)
BUN: 16 mg/dL (ref 6–20)
CO2: 24 mmol/L (ref 22–32)
Calcium: 8.5 mg/dL — ABNORMAL LOW (ref 8.9–10.3)
Chloride: 102 mmol/L (ref 98–111)
Creatinine, Ser: 0.96 mg/dL (ref 0.44–1.00)
GFR, Estimated: >60 mL/min (ref >60)
Glucose, Bld: 155 mg/dL — ABNORMAL HIGH (ref 70–99)
Potassium: 4.3 mmol/L (ref 3.5–5.1)
Sodium: 136 mmol/L (ref 135–145)

## 2020-08-30 LAB — CULTURE, OB URINE: Culture: NO GROWTH

## 2020-08-30 MED ORDER — SPIRONOLACTONE 12.5 MG HALF TABLET
12.5000 mg | ORAL_TABLET | Freq: Every day | ORAL | Status: DC
  Administered 2020-08-30 – 2020-09-01 (×3): 12.5 mg via ORAL
  Filled 2020-08-30 (×3): qty 1

## 2020-08-30 NOTE — Progress Notes (Signed)
Pt has refused cpap for tonight.  Pt aware she can request it if she changes her mind.  RN aware.  RT will continue to monitor.

## 2020-08-30 NOTE — Progress Notes (Signed)
CSW checked in with MOB at infant's bedside. RN and mother present. CSW congratulated MOB on baby girl. CSW agreed to meet with MOB later this week to complete psychosocial assessment, MOB agreeable.   Celso Sickle, LCSW Clinical Social Worker [facility] Cell#: [phone]

## 2020-08-30 NOTE — Progress Notes (Signed)
POD #2 s/p PCS for breech, cardiogenic shock at 31 wks.  Nausea resolved, some 'tugging' pain in RLQ but overall pain well controlled. Able to tol po. Up to walk and void today. + flatus but no BM yet.  Breathing better. Using tylenol and motrin for pain control  O:  Vitals:   08/30/20 0900 08/30/20 1113  BP: 108/68   Pulse: (!) 118   Resp: (!) 24   Temp:  98.1 F (36.7 C)  SpO2: 98%    well appearing, cheerful, no increased work of breathing appreciable LE: 4+ LE edema Breasts: non tender, no engorgement Inc- dressed, wound vac on Abd: obese appropriately tender, ND  A/P: POD#2 PCS at 31 wks for worsening heart failure/ cardiogenic shock - post-op recovering well, bowel function returning. Cont pain meds. No breast stimulation, will treat engorgement prn.  - dysuria, likely surgical pain, low risk for UTI, UCx pending.  - primary management by heart failure team. - Dispo, per heart team, pt will need post-op f/u at 6 wks with OB, usual d/c day post-c/s is POD#3 and pt meeting OB/post-op d/c criteria.   Lendon Colonel 08/30/2020 11:35 AM

## 2020-08-30 NOTE — Progress Notes (Addendum)
Patient ID: [name], female   DOB: 02/27/1981, 39 y.o.   MRN: [id number]     Advanced Heart Failure Rounding Note  PCP-Cardiologist: Chilton Si, MD   Subjective:    - Moved to ICU 12/10 for cardiogenic shock with MV sat 40%. Started on milrinone.  Initial CVP 21. - Delivered via C-section 12/11.   Tolerating milrinone wean, now on 0.125. Co-ox stable at 73%.   -4.8L in UOP yesterday w/ IV Lasix. SCr and K WNL.  CVP 8-9 but still w/ significant bilateral LEE. No dyspnea    Objective:   Weight Range: 127.7 kg Body mass index is 42.81 kg/m.   Vital Signs:   Temp:  [98.1 F (36.7 C)-98.6 F (37 C)] 98.1 F (36.7 C) (12/12 1958) Pulse Rate:  [90-126] 102 (12/13 0600) Resp:  [11-31] 16 (12/13 0600) BP: (97-123)/(56-82) 109/75 (12/13 0636) SpO2:  [90 %-99 %] 96 % (12/13 0748) Weight:  [127.7 kg] 127.7 kg (12/13 0500) Last BM Date: 08/27/20  Weight change: Filed Weights   08/27/20 0608 08/28/20 0442 08/30/20 0500  Weight: 131.9 kg 131.9 kg 127.7 kg    Intake/Output:   Intake/Output Summary (Last 24 hours) at 08/30/2020 0803 Last data filed at 08/30/2020 0600 Gross per 24 hour  Intake 223.5 ml  Output 4800 ml  Net -4576.5 ml      Physical Exam    CVP 8-9 (still w/ significant LEE, suspect 3rd spacing)  General:  Well appearing, moderately obese. No respiratory difficulty HEENT: normal Neck: supple. Thick neck, JVD not well visualized. Carotids 2+ bilat; no bruits. No lymphadenopathy or thyromegaly appreciated. Cor: PMI nondisplaced. Regular rhythm, tachy rate. No rubs, gallops or murmurs. Lungs: clear Abdomen: soft, nontender, nondistended. No hepatosplenomegaly. No bruits or masses. Good bowel sounds. Extremities: no cyanosis, clubbing, rash, 2+ bilateral LEE Neuro: alert & oriented x 3, cranial nerves grossly intact. moves all 4 extremities w/o difficulty. Affect pleasant.   Telemetry   Sinus tach 110s Personally reviewed   Labs     CBC Recent Labs    08/28/20 0332 08/28/20 1520 08/29/20 0500 08/30/20 0437  WBC 15.8* 20.9* 17.4* 16.4*  NEUTROABS 12.0* 17.6*  --   --   HGB 10.3* 10.4* 9.6* 9.5*  HCT 32.2* 34.3* 31.5* 31.6*  MCV 75.6* 77.1* 76.8* 77.8*  PLT 292 271 243 226   Basic Metabolic Panel Recent Labs    88/50/27 0332 08/29/20 0500 08/30/20 0437  NA 137 135 136  K 3.5 4.1 4.3  CL 104 104 102  CO2 22 22 24   GLUCOSE 143* 116* 155*  BUN 20 17 16   CREATININE 0.92 0.95 0.96  CALCIUM 8.6* 8.7* 8.5*  MG 1.5* 2.0  --    Liver Function Tests No results for input(s): AST, ALT, ALKPHOS, BILITOT, PROT, ALBUMIN in the last 72 hours. No results for input(s): LIPASE, AMYLASE in the last 72 hours. Cardiac Enzymes No results for input(s): CKTOTAL, CKMB, CKMBINDEX, TROPONINI in the last 72 hours.  BNP: BNP (last 3 results) Recent Labs    08/24/20 1242  BNP 241.0*    ProBNP (last 3 results) No results for input(s): PROBNP in the last 8760 hours.   D-Dimer No results for input(s): DDIMER in the last 72 hours. Hemoglobin A1C No results for input(s): HGBA1C in the last 72 hours. Fasting Lipid Panel No results for input(s): CHOL, HDL, LDLCALC, TRIG, CHOLHDL, LDLDIRECT in the last 72 hours. Thyroid Function Tests No results for input(s): TSH, T4TOTAL, T3FREE, THYROIDAB in the  last 72 hours.  Invalid input(s): FREET3  Other results:   Imaging    No results found.   Medications:     Scheduled Medications: . acetaminophen  1,000 mg Oral Q6H  . aspirin  81 mg Oral QHS  . budesonide  0.25 mg Nebulization BID  . Chlorhexidine Gluconate Cloth  6 each Topical Daily  . digoxin  0.125 mg Oral Daily  . docusate sodium  100 mg Oral Daily  . furosemide  40 mg Intravenous BID  . hydrALAZINE  50 mg Oral Q8H  . ibuprofen  600 mg Oral Q6H  . insulin aspart  0-9 Units Subcutaneous Q4H  . isosorbide dinitrate  10 mg Oral TID  . prenatal multivitamin  1 tablet Oral Q1200  . sacubitril-valsartan  1  tablet Oral BID  . scopolamine  1 patch Transdermal Once  . simethicone  80 mg Oral Daily  . sodium chloride flush  10-40 mL Intracatheter Q12H  . valACYclovir  1,000 mg Oral Daily  . [START ON 08/31/2020] Vitamin D (Ergocalciferol)  50,000 Units Oral Q7 days    Infusions: . sodium chloride Stopped (08/28/20 0837)  . milrinone 0.125 mcg/kg/min (08/30/20 0645)  . naLOXone [facility]) adult infusion for PRURITIS      PRN Medications: sodium chloride, albuterol, calcium carbonate, coconut oil, witch hazel-glycerin **AND** dibucaine, diphenhydrAMINE **OR** diphenhydrAMINE, hydrALAZINE, nalbuphine **OR** nalbuphine, naloxone **AND** sodium chloride flush, naLOXone (NARCAN) adult infusion for PRURITIS, ondansetron (ZOFRAN) IV, oxyCODONE, sodium chloride flush, zolpidem    Assessment/Plan   1. Acute Biventricular Heart Failure  -> cardiogenic shock - Echo this admit severely reduced EF 15-20% . Possible HTN CM - developed shock physiology on 12/10. MV sat 40%. Moved to ICU - C-section 12/11 without complications.  - now on milrinone 0.125, co-ox 73%.   - Remains volume overloaded. CVP 8-9 but still w/ significant LEE (suspect 3rd spacing) - remove TED hoses and place UNNA boots  - continue hydralazine/nitrates and dig. Off b-blocker due to shock.  - Continue Entresto 24/26 bid.   2. HTN - controlled on current regimen  - treatment as above  3. AKI - due to ATN, cardiorenal - improved with hemodynamic support  4. Hypokalemia/hypomag - supped  5. DM2 - SSI  6. OSA - Recently diagnosed. CPAP   7. H/O Asthma.   8. DVT prophylaxis: Heparin [place]  Length of Stay: [address], PA-C  08/30/2020, 8:03 AM  Advanced Heart Failure Team Pager [phone] (M-F; 7a - 4p)  Please contact [facility] for night-coverage after hours (4p -7a ) and weekends on [email or web address]  Patient seen and examined with the above-signed Advanced Practice Provider and/or Housestaff. I personally  reviewed laboratory data, imaging studies and relevant notes. I independently examined the patient and formulated the important aspects of the plan. I have edited the note to reflect any of my changes or salient points. I have personally discussed the plan with the patient and/or family.  Diuresing well on milrinone 0.125 and IV lasix. Co-ox 73%. CVP 8-9 but still with LE edema.  General:  Well appearing. No resp difficulty HEENT: normal Neck: supple. JVP 8-9 Carotids 2+ bilat; no bruits. No lymphadenopathy or thryomegaly appreciated. Cor: PMI nondisplaced. Regular rate & rhythm. No rubs, gallops or murmurs. Lungs: clear Abdomen: obese soft, nontender, nondistended. No hepatosplenomegaly. No bruits or masses. Good bowel sounds. Extremities: no cyanosis, clubbing, rash, 2+ edema Neuro: alert & orientedx3, cranial nerves grossly intact. moves all 4 extremities w/o difficulty. Affect pleasant  Can turn milrinone off. Continue Entresto, digoxin, hydral/nitrates. Continue IV lasix one more day. Add spiro as tolerated.   Given advanced HF and need for intensive goal-directed HF therapy, we strongly suggest that patient does NOT breastfeed as this will limit therapeutic options for cardiac recovery.   Can go to [facility]  Arvilla Meres, MD  8:53 AM

## 2020-08-30 NOTE — Progress Notes (Signed)
Orthopedic Tech Progress Note Patient Details:  [name] 1980-10-12 [id number] Just went to apply UNNA BOOTS. RN never called Ortho Devices Type of Ortho Device: Radio broadcast assistant Ortho Device/Splint Location: BLE Ortho Device/Splint Interventions: Application,Adjustment   Post Interventions Patient Tolerated: Well Instructions Provided: Care of device   Donald Pore 08/30/2020, 12:24 PM

## 2020-08-30 NOTE — Progress Notes (Signed)
Orthopedic Tech Progress Note Patient Details:  RELLA EGELSTON 02/01/1981 [id number] Tried to reach RN to see if its a good time to come an apply UNNA BOOTS to patient. No answer. Will try again later Patient ID: Dorothy Puffer, female   DOB: Dec 05, 1980, 39 y.o.   MRN: [id number]   Donald Pore 08/30/2020, 11:20 AM

## 2020-08-30 NOTE — Plan of Care (Signed)
  Problem: Education: Goal: Knowledge of General Education information will improve Description: Including pain rating scale, medication(s)/side effects and non-pharmacologic comfort measures Outcome: Progressing   Problem: Health Behavior/Discharge Planning: Goal: Ability to manage health-related needs will improve Outcome: Progressing   Problem: Clinical Measurements: Goal: Ability to maintain clinical measurements within normal limits will improve Outcome: Progressing Goal: Will remain free from infection Outcome: Progressing Goal: Diagnostic test results will improve Outcome: Progressing Goal: Respiratory complications will improve Outcome: Progressing Goal: Cardiovascular complication will be avoided Outcome: Progressing   Problem: Activity: Goal: Risk for activity intolerance will decrease Outcome: Progressing   Problem: Coping: Goal: Level of anxiety will decrease Outcome: Progressing   Problem: Elimination: Goal: Will not experience complications related to bowel motility Outcome: Progressing   Problem: Pain Managment: Goal: General experience of comfort will improve Outcome: Progressing   Problem: Safety: Goal: Ability to remain free from injury will improve Outcome: Progressing   Problem: Skin Integrity: Goal: Risk for impaired skin integrity will decrease Outcome: Progressing   Problem: Education: Goal: Knowledge of disease or condition will improve Outcome: Progressing Goal: Knowledge of the prescribed therapeutic regimen will improve Outcome: Progressing Goal: Individualized Educational Video(s) Outcome: Progressing   Problem: Clinical Measurements: Goal: Complications related to the disease process, condition or treatment will be avoided or minimized Outcome: Progressing   Problem: Education: Goal: Knowledge of disease or condition will improve Outcome: Progressing Goal: Knowledge of the prescribed therapeutic regimen will improve Outcome:  Progressing   Problem: Fluid Volume: Goal: Peripheral tissue perfusion will improve Outcome: Progressing   Problem: Clinical Measurements: Goal: Complications related to disease process, condition or treatment will be avoided or minimized Outcome: Progressing   Problem: Nutrition: Goal: Adequate nutrition will be maintained Outcome: Completed/Met   Problem: Elimination: Goal: Will not experience complications related to urinary retention Outcome: Completed/Met

## 2020-08-31 LAB — COOXEMETRY PANEL
Carboxyhemoglobin: 1.5 % (ref 0.5–1.5)
Methemoglobin: 0.7 % (ref 0.0–1.5)
O2 Saturation: 68.5 %
Total hemoglobin: 10.9 g/dL — ABNORMAL LOW (ref 12.0–16.0)

## 2020-08-31 LAB — CBC
HCT: 35 % — ABNORMAL LOW (ref 36.0–46.0)
Hemoglobin: 10.5 g/dL — ABNORMAL LOW (ref 12.0–15.0)
MCH: 23.3 pg — ABNORMAL LOW (ref 26.0–34.0)
MCHC: 30 g/dL (ref 30.0–36.0)
MCV: 77.6 fL — ABNORMAL LOW (ref 80.0–100.0)
Platelets: 278 10*3/uL (ref 150–400)
RBC: 4.51 MIL/uL (ref 3.87–5.11)
RDW: 16.4 % — ABNORMAL HIGH (ref 11.5–15.5)
WBC: 17.5 10*3/uL — ABNORMAL HIGH (ref 4.0–10.5)
nRBC: 0.1 % (ref 0.0–0.2)

## 2020-08-31 LAB — GLUCOSE, CAPILLARY
Glucose-Capillary: 100 mg/dL — ABNORMAL HIGH (ref 70–99)
Glucose-Capillary: 91 mg/dL (ref 70–99)
Glucose-Capillary: 115 mg/dL — ABNORMAL HIGH (ref 70–99)
Glucose-Capillary: 95 mg/dL (ref 70–99)
Glucose-Capillary: 134 mg/dL — ABNORMAL HIGH (ref 70–99)

## 2020-08-31 LAB — BASIC METABOLIC PANEL
Anion gap: 12 (ref 5–15)
BUN: 11 mg/dL (ref 6–20)
CO2: 24 mmol/L (ref 22–32)
Calcium: 8.7 mg/dL — ABNORMAL LOW (ref 8.9–10.3)
Chloride: 105 mmol/L (ref 98–111)
Creatinine, Ser: 0.86 mg/dL (ref 0.44–1.00)
GFR, Estimated: >60 mL/min (ref >60)
Glucose, Bld: 95 mg/dL (ref 70–99)
Potassium: 4.2 mmol/L (ref 3.5–5.1)
Sodium: 141 mmol/L (ref 135–145)

## 2020-08-31 LAB — MAGNESIUM: Magnesium: 1.6 mg/dL — ABNORMAL LOW (ref 1.7–2.4)

## 2020-08-31 MED ORDER — POLYETHYLENE GLYCOL 3350 17 G PO PACK: 17.0000 g | PACK | Freq: Every day | ORAL | Status: DC | PRN

## 2020-08-31 MED ORDER — SENNOSIDES-DOCUSATE SODIUM 8.6-50 MG PO TABS
2.0000 | ORAL_TABLET | Freq: Two times a day (BID) | ORAL | Status: DC
  Administered 2020-08-31 – 2020-09-01 (×3): 2 via ORAL
  Filled 2020-08-31 (×3): qty 2

## 2020-08-31 MED ORDER — SIMETHICONE 80 MG PO CHEW
160.0000 mg | CHEWABLE_TABLET | Freq: Four times a day (QID) | ORAL | Status: DC | PRN
  Administered 2020-08-31: 160 mg via ORAL
  Filled 2020-08-31: qty 2

## 2020-08-31 MED ORDER — MAGNESIUM SULFATE 4 GM/100ML IV SOLN
4.0000 g | Freq: Once | INTRAVENOUS | Status: AC
  Administered 2020-08-31: 4 g via INTRAVENOUS
  Filled 2020-08-31: qty 100

## 2020-08-31 MED ORDER — SORBITOL 70 % SOLN
30.0000 mL | Status: AC
  Administered 2020-08-31: 30 mL via ORAL
  Filled 2020-08-31: qty 30

## 2020-08-31 NOTE — Progress Notes (Signed)
Pt refused CPAP for tonight.  

## 2020-08-31 NOTE — Plan of Care (Signed)
  Problem: Education: Goal: Knowledge of General Education information will improve Description: Including pain rating scale, medication(s)/side effects and non-pharmacologic comfort measures Outcome: Progressing   Problem: Health Behavior/Discharge Planning: Goal: Ability to manage health-related needs will improve Outcome: Progressing   Problem: Clinical Measurements: Goal: Ability to maintain clinical measurements within normal limits will improve Outcome: Progressing Goal: Will remain free from infection Outcome: Progressing Goal: Diagnostic test results will improve Outcome: Progressing Goal: Respiratory complications will improve Outcome: Progressing Goal: Cardiovascular complication will be avoided Outcome: Progressing   Problem: Activity: Goal: Risk for activity intolerance will decrease Outcome: Progressing   Problem: Coping: Goal: Level of anxiety will decrease Outcome: Progressing   Problem: Elimination: Goal: Will not experience complications related to bowel motility Outcome: Progressing   Problem: Pain Managment: Goal: General experience of comfort will improve Outcome: Progressing   Problem: Safety: Goal: Ability to remain free from injury will improve Outcome: Progressing   Problem: Skin Integrity: Goal: Risk for impaired skin integrity will decrease Outcome: Progressing   Problem: Education: Goal: Knowledge of disease or condition will improve Outcome: Progressing Goal: Knowledge of the prescribed therapeutic regimen will improve Outcome: Progressing Goal: Individualized Educational Video(s) Outcome: Progressing   Problem: Clinical Measurements: Goal: Complications related to the disease process, condition or treatment will be avoided or minimized Outcome: Progressing   Problem: Education: Goal: Knowledge of disease or condition will improve Outcome: Progressing Goal: Knowledge of the prescribed therapeutic regimen will improve Outcome:  Progressing   Problem: Fluid Volume: Goal: Peripheral tissue perfusion will improve Outcome: Progressing   Problem: Clinical Measurements: Goal: Complications related to disease process, condition or treatment will be avoided or minimized Outcome: Progressing

## 2020-08-31 NOTE — Progress Notes (Signed)
Subjective: POD# 3 Live born female  Birth Weight: 3 lb 6.3 oz (1540 g) APGAR: 8, 9  Newborn Delivery   Birth date/time: 08/28/2020 10:26:00 Delivery type: C-Section, Low Transverse Trial of labor: No C-section categorization: Primary     Baby name: Ariyah (NICU) Delivering provider: Olivia Mackie   Feeding: bottle, unable to pump due to cardiac meds per cardiology  Pain control at delivery: Spinal   Reports feeling well overall. Excited to be moving out of the ICU today and to a regular cardiac floor. States she has some right sided tugging pain near incision but relieved with PO meds. Desires discharge today but needs cleared by cardiology. Likely discharge home tomorrow. Would like to know when she can return to work.   Patient reports tolerating PO.   Pain controlled with acetaminophen, ibuprofen (OTC) and narcotic analgesics including Oxy IR Denies HA/SOB/C/P/N/V/dizziness. Flatus yes. She reports vaginal bleeding as normal, without clots. She is ambulating and urinating without difficulty.     Objective:  Vitals:   08/31/20 0634 08/31/20 0733 08/31/20 0819 08/31/20 0855  BP: 118/70   122/81  Pulse:    (!) 123  Resp:    (!) 23  Temp:  97.9 F (36.6 C)    TempSrc:  Oral    SpO2:   98% 98%  Weight:      Height:         Intake/Output Summary (Last 24 hours) at 08/31/2020 1209 Last data filed at 08/31/2020 0500 Gross per 24 hour  Intake 379.6 ml  Output 3250 ml  Net -2870.4 ml      Recent Labs    08/30/20 0437 08/31/20 0443  WBC 16.4* 17.5*  HGB 9.5* 10.5*  HCT 31.6* 35.0*  PLT 226 278    Blood type: --/--/O POS (12/07 1400)  Rubella:     Physical Exam:  General: alert and cooperative CV: Sinus tachycardia and regular rhythm  Resp: clear Abdomen: soft, nontender, normal bowel sounds Incision: wound vac on and operational Uterine Fundus: firm, below umbilicus, nontender Lochia: minimal Ext: extremities normal, atraumatic, no cyanosis or edema and  edema trace   Assessment/Plan: 39 y.o.   POD# 3. [id number]                  Active Problems:   Morbid obesity (HCC)   White classification A2 gestational diabetes mellitus (GDM), insulin controlled  Diabetic coordinator following  Insulin per orders   Shortness of breath   Pulmonary vascular congestion   Acute on chronic diastolic congestive heart failure [facility])  Cardiology following and managing   Edema   Acute combined systolic and diastolic heart failure, NYHA class 4 (HCC)   Essential hypertension  Cardiology following and managing   OSA (obstructive sleep apnea)  CPAP   Cesarean delivery - 31 wks/cardiac dz   Postpartum care following cesarean delivery 08/28/2020  Routine post-op care  Wound vac on and operational  Will return to office in 10-14 days for wound vac removal  Dr. Ernestina Penna to advise on return to work  Likely discharge tomorrow if cleared by cardiology.   June Leap, CNM, MSN 08/31/2020, 12:09 PM

## 2020-08-31 NOTE — Progress Notes (Addendum)
Patient ID: [name], female   DOB: 1981-09-13, 39 y.o.   MRN: [id number]     Advanced Heart Failure Rounding Note  PCP-Cardiologist: Chilton Si, MD   Subjective:    - Moved to ICU 12/10 for cardiogenic shock with MV sat 40%. Started on milrinone.  Initial CVP 21. - Delivered via C-section 12/11.   Milrinone off. Co-ox 69%   Still diuresing with IV lasix.  Weight down 5 more pounds. Breathing much better. No orthopnea or PND.   Mild ab pain. Passing gas. No BM yet.    Objective:   Weight Range: 125.2 kg Body mass index is 41.97 kg/m.   Vital Signs:   Temp:  [97.9 F (36.6 C)-98.5 F (36.9 C)] 97.9 F (36.6 C) (12/14 0733) Pulse Rate:  [102-120] 110 (12/14 0600) Resp:  [15-29] 17 (12/14 0600) BP: (107-126)/(59-73) 118/70 (12/14 0634) SpO2:  [91 %-100 %] 91 % (12/14 0600) Weight:  [125.2 kg] 125.2 kg (12/14 0446) Last BM Date: 08/27/20  Weight change: Filed Weights   08/28/20 0442 08/30/20 0500 08/31/20 0446  Weight: 131.9 kg 127.7 kg 125.2 kg    Intake/Output:   Intake/Output Summary (Last 24 hours) at 08/31/2020 0802 Last data filed at 08/31/2020 0500 Gross per 24 hour  Intake 624.55 ml  Output 4200 ml  Net -3575.45 ml      Physical Exam    General:  Well appearing. No resp difficulty HEENT: normal Neck: supple. no JVD. Carotids 2+ bilat; no bruits. No lymphadenopathy or thryomegaly appreciated. Cor: PMI nondisplaced. Regular rate & rhythm. No rubs, gallops or murmurs. Lungs: clear Abdomen: obese. + wound vac.  Mild tenderness. Soft. Good bowel sounds. Extremities: no cyanosis, clubbing, rash, tr edema Roland Rack Neuro: alert & orientedx3, cranial nerves grossly intact. moves all 4 extremities w/o difficulty. Affect pleasant   Telemetry   Sinus tach 100-115 Personally reviewed   Labs    CBC Recent Labs    08/28/20 1520 08/29/20 0500 08/30/20 0437 08/31/20 0443  WBC 20.9*   < > 16.4* 17.5*  NEUTROABS 17.6*  --   --   --   HGB  10.4*   < > 9.5* 10.5*  HCT 34.3*   < > 31.6* 35.0*  MCV 77.1*   < > 77.8* 77.6*  PLT 271   < > 226 278   < > = values in this interval not displayed.   Basic Metabolic Panel Recent Labs    28/41/32 0500 08/30/20 0437  NA 135 136  K 4.1 4.3  CL 104 102  CO2 22 24  GLUCOSE 116* 155*  BUN 17 16  CREATININE 0.95 0.96  CALCIUM 8.7* 8.5*  MG 2.0  --    Liver Function Tests No results for input(s): AST, ALT, ALKPHOS, BILITOT, PROT, ALBUMIN in the last 72 hours. No results for input(s): LIPASE, AMYLASE in the last 72 hours. Cardiac Enzymes No results for input(s): CKTOTAL, CKMB, CKMBINDEX, TROPONINI in the last 72 hours.  BNP: BNP (last 3 results) Recent Labs    08/24/20 1242  BNP 241.0*    ProBNP (last 3 results) No results for input(s): PROBNP in the last 8760 hours.   D-Dimer No results for input(s): DDIMER in the last 72 hours. Hemoglobin A1C No results for input(s): HGBA1C in the last 72 hours. Fasting Lipid Panel No results for input(s): CHOL, HDL, LDLCALC, TRIG, CHOLHDL, LDLDIRECT in the last 72 hours. Thyroid Function Tests No results for input(s): TSH, T4TOTAL, T3FREE, THYROIDAB in the last  72 hours.  Invalid input(s): FREET3  Other results:   Imaging    No results found.   Medications:     Scheduled Medications: . acetaminophen  1,000 mg Oral Q6H  . aspirin  81 mg Oral QHS  . budesonide  0.25 mg Nebulization BID  . Chlorhexidine Gluconate Cloth  6 each Topical Daily  . digoxin  0.125 mg Oral Daily  . docusate sodium  100 mg Oral Daily  . furosemide  40 mg Intravenous BID  . hydrALAZINE  50 mg Oral Q8H  . ibuprofen  600 mg Oral Q6H  . insulin aspart  0-9 Units Subcutaneous Q4H  . isosorbide dinitrate  10 mg Oral TID  . prenatal multivitamin  1 tablet Oral Q1200  . sacubitril-valsartan  1 tablet Oral BID  . scopolamine  1 patch Transdermal Once  . simethicone  80 mg Oral Daily  . sodium chloride flush  10-40 mL Intracatheter Q12H  .  spironolactone  12.5 mg Oral Daily  . valACYclovir  1,000 mg Oral Daily  . Vitamin D (Ergocalciferol)  50,000 Units Oral Q7 days    Infusions: . sodium chloride Stopped (08/28/20 0837)  . naLOXone [facility]) adult infusion for PRURITIS      PRN Medications: sodium chloride, albuterol, calcium carbonate, coconut oil, witch hazel-glycerin **AND** dibucaine, diphenhydrAMINE **OR** diphenhydrAMINE, hydrALAZINE, nalbuphine **OR** nalbuphine, naloxone **AND** sodium chloride flush, naLOXone (NARCAN) adult infusion for PRURITIS, ondansetron (ZOFRAN) IV, oxyCODONE, sodium chloride flush, zolpidem    Assessment/Plan   1. Acute Biventricular Heart Failure  -> cardiogenic shock - Echo this admit severely reduced EF 15-20% . Possible HTN CM - developed shock physiology on 12/10. MV sat 40%. Moved to ICU - C-section 12/11 without complications.  - milrinone off 12/13 co-ox 68%  - Volume status continues to improve. CVP 5. Stop diuretics. May need low dose oral lasix as outpatient after d/c. - continue UNNA - continue hydralazine/nitrates Off b-blocker due to shock.  - Continue Entresto 24/26 bid.  - Continue dig - Continue spiro - needs BMET today  2. HTN - controlled on current regimen - treatment as above  3. AKI - due to ATN, cardiorenal - resolved  4. Hypokalemia/hypomag - BMET pending  5. DM2 - SSI  6. OSA - Recently diagnosed. CPAP   7. DVT prophylaxis: Heparin SQ  Transfer to [facility]  Length of Stay: 7  Arvilla Meres, MD  08/31/2020, 8:02 AM  Advanced Heart Failure Team Pager [phone] (M-F; 7a - 4p)  Please contact [facility] for night-coverage after hours (4p -7a ) and weekends on [email or web address]

## 2020-08-31 NOTE — Addendum Note (Signed)
Addendum  created 08/31/20 1011 by Christiona Siddique, Nelle Don, DO   Intraprocedure Staff edited

## 2020-08-31 NOTE — Plan of Care (Signed)
°  Problem: Education: °Goal: Knowledge of General Education information will improve °Description: Including pain rating scale, medication(s)/side effects and non-pharmacologic comfort measures °Outcome: Progressing °  °Problem: Health Behavior/Discharge Planning: °Goal: Ability to manage health-related needs will improve °Outcome: Progressing °  °Problem: Clinical Measurements: °Goal: Ability to maintain clinical measurements within normal limits will improve °Outcome: Progressing °Goal: Will remain free from infection °Outcome: Progressing °Goal: Diagnostic test results will improve °Outcome: Progressing °Goal: Respiratory complications will improve °Outcome: Progressing °Goal: Cardiovascular complication will be avoided °Outcome: Progressing °  °Problem: Activity: °Goal: Risk for activity intolerance will decrease °Outcome: Progressing °  °Problem: Coping: °Goal: Level of anxiety will decrease °Outcome: Progressing °  °Problem: Elimination: °Goal: Will not experience complications related to bowel motility °Outcome: Progressing °  °Problem: Pain Managment: °Goal: General experience of comfort will improve °Outcome: Progressing °  °Problem: Safety: °Goal: Ability to remain free from injury will improve °Outcome: Progressing °  °Problem: Skin Integrity: °Goal: Risk for impaired skin integrity will decrease °Outcome: Progressing °  °

## 2020-09-01 ENCOUNTER — Other Ambulatory Visit (HOSPITAL_COMMUNITY): Payer: Self-pay | Admitting: Adult Health

## 2020-09-01 LAB — GLUCOSE, CAPILLARY
Glucose-Capillary: 101 mg/dL — ABNORMAL HIGH (ref 70–99)
Glucose-Capillary: 108 mg/dL — ABNORMAL HIGH (ref 70–99)
Glucose-Capillary: 131 mg/dL — ABNORMAL HIGH (ref 70–99)
Glucose-Capillary: 88 mg/dL (ref 70–99)

## 2020-09-01 LAB — CBC
HCT: 32.7 % — ABNORMAL LOW (ref 36.0–46.0)
Hemoglobin: 9.7 g/dL — ABNORMAL LOW (ref 12.0–15.0)
MCH: 22.9 pg — ABNORMAL LOW (ref 26.0–34.0)
MCHC: 29.7 g/dL — ABNORMAL LOW (ref 30.0–36.0)
MCV: 77.1 fL — ABNORMAL LOW (ref 80.0–100.0)
Platelets: 252 10*3/uL (ref 150–400)
RBC: 4.24 MIL/uL (ref 3.87–5.11)
RDW: 16.3 % — ABNORMAL HIGH (ref 11.5–15.5)
WBC: 15.9 10*3/uL — ABNORMAL HIGH (ref 4.0–10.5)
nRBC: 0.1 % (ref 0.0–0.2)

## 2020-09-01 LAB — BASIC METABOLIC PANEL
Anion gap: 10 (ref 5–15)
BUN: 9 mg/dL (ref 6–20)
CO2: 26 mmol/L (ref 22–32)
Calcium: 8.7 mg/dL — ABNORMAL LOW (ref 8.9–10.3)
Chloride: 104 mmol/L (ref 98–111)
Creatinine, Ser: 0.79 mg/dL (ref 0.44–1.00)
GFR, Estimated: >60 mL/min (ref >60)
Glucose, Bld: 100 mg/dL — ABNORMAL HIGH (ref 70–99)
Potassium: 4.2 mmol/L (ref 3.5–5.1)
Sodium: 140 mmol/L (ref 135–145)

## 2020-09-01 LAB — COOXEMETRY PANEL
Carboxyhemoglobin: 1.5 % (ref 0.5–1.5)
Methemoglobin: 0.7 % (ref 0.0–1.5)
O2 Saturation: 61.2 %
Total hemoglobin: 10.4 g/dL — ABNORMAL LOW (ref 12.0–16.0)

## 2020-09-01 MED ORDER — CARVEDILOL 3.125 MG PO TABS
3.1250 mg | ORAL_TABLET | Freq: Two times a day (BID) | ORAL | Status: DC
  Administered 2020-09-01 (×2): 3.125 mg via ORAL
  Filled 2020-09-01 (×2): qty 1

## 2020-09-01 MED ORDER — FLUOXETINE HCL 20 MG PO CAPS: 20.0000 mg | ORAL_CAPSULE | Freq: Every day | ORAL | 3 refills | Status: DC

## 2020-09-01 MED ORDER — HYDRALAZINE HCL 50 MG PO TABS: 50.0000 mg | ORAL_TABLET | Freq: Three times a day (TID) | ORAL | 6 refills | Status: DC

## 2020-09-01 MED ORDER — CARVEDILOL 3.125 MG PO TABS: 3.1250 mg | ORAL_TABLET | Freq: Two times a day (BID) | ORAL | 6 refills | Status: DC

## 2020-09-01 MED ORDER — FUROSEMIDE 20 MG PO TABS: 20.0000 mg | ORAL_TABLET | Freq: Every day | ORAL | 6 refills | Status: DC

## 2020-09-01 MED ORDER — WITCH HAZEL-GLYCERIN EX PADS
1.0000 | MEDICATED_PAD | CUTANEOUS | 12 refills | Status: DC | PRN
Start: 2020-09-01 — End: 2020-11-12

## 2020-09-01 MED ORDER — SENNOSIDES-DOCUSATE SODIUM 8.6-50 MG PO TABS
2.0000 | ORAL_TABLET | Freq: Two times a day (BID) | ORAL | 6 refills | Status: DC
Start: 2020-09-01 — End: 2020-09-01

## 2020-09-01 MED ORDER — FLUOXETINE HCL 20 MG PO CAPS: 20.0000 mg | ORAL_CAPSULE | Freq: Every day | ORAL | Status: DC

## 2020-09-01 MED ORDER — SACUBITRIL-VALSARTAN 24-26 MG PO TABS
1.0000 | ORAL_TABLET | Freq: Two times a day (BID) | ORAL | 6 refills | Status: DC
Start: 2020-09-01 — End: 2020-09-30

## 2020-09-01 MED ORDER — SPIRONOLACTONE 25 MG PO TABS: 12.5000 mg | ORAL_TABLET | Freq: Every day | ORAL | 6 refills | Status: DC

## 2020-09-01 MED ORDER — CARVEDILOL 3.125 MG PO TABS: 3.1250 mg | ORAL_TABLET | Freq: Two times a day (BID) | ORAL | Status: DC

## 2020-09-01 MED ORDER — ISOSORBIDE DINITRATE 10 MG PO TABS: 10.0000 mg | ORAL_TABLET | Freq: Three times a day (TID) | ORAL | 6 refills | Status: DC

## 2020-09-01 MED ORDER — DIGOXIN 125 MCG PO TABS: 0.1250 mg | ORAL_TABLET | Freq: Every day | ORAL | 6 refills | Status: DC

## 2020-09-01 MED FILL — FUROSEMIDE 20 MG TAB: 20 | 30 days supply | Qty: 30 | Fill #0

## 2020-09-01 MED FILL — CARVEDILOL 3.125 MG TABLET: 3.125 | 30 days supply | Qty: 60 | Fill #0

## 2020-09-01 MED FILL — ENTRESTO 24 MG-26 MG TABLET: 24-26 | 30 days supply | Qty: 60 | Fill #0

## 2020-09-01 MED FILL — ISOSORBIDE DN 10 MG TABLET: 10 | 30 days supply | Qty: 90 | Fill #0

## 2020-09-01 MED FILL — hydrALAZINE HCL 50 MG TABS: 50 | 30 days supply | Qty: 90 | Fill #0

## 2020-09-01 MED FILL — FLUoxetine HCL 20 MG CAPS: 20 | 30 days supply | Qty: 30 | Fill #0

## 2020-09-01 MED FILL — SPIRONOLACTONE 25 MG TABLET: 25 | 60 days supply | Qty: 30 | Fill #0

## 2020-09-01 MED FILL — DIGOXIN 0.125 MG TABLET: 125 | 30 days supply | Qty: 30 | Fill #0

## 2020-09-01 MED FILL — SENEXON-S 8.6-50 MG TABS: 8.6-50 | 15 days supply | Qty: 60 | Fill #0

## 2020-09-01 NOTE — TOC Transition Note (Signed)
Transition of Care [facility]) - CM/SW Discharge Note   Patient Details  Name: [name] MRN: [id number] Date of Birth: 01-20-1981  Transition of Care [facility]) CM/SW Contact:  Leone Haven, RN Phone Number: 09/01/2020, 3:19 PM   Clinical Narrative:    Patient is for dc today, she has a preveena wound  Vac.  NCM informed  Office manager that she can get the preveena canister from the OR , the preveena incision  Wound vac should not come off until patient is at the MD office visit.     Final next level of care: Home/Self Care Barriers to Discharge: No Barriers Identified   Patient Goals and CMS Choice        Discharge Placement                       Discharge Plan and Services                                     Social Determinants of Health (SDOH) Interventions     Readmission Risk Interventions No flowsheet data found.

## 2020-09-01 NOTE — TOC Progression Note (Signed)
Transition of Care [facility]) - Progression Note    Patient Details  Name: [name] MRN: [id number] Date of Birth: January 15, 1981  Transition of Care [facility]) CM/SW Contact  Leone Haven, RN Phone Number: 09/01/2020, 3:16 PM  Clinical Narrative:    Patient is for dc today, she has a preveena wound  Vac.  NCM informed  Office manager that she can get the preveena canister from the OR , the preveena incision  Wound vac should not come off until patient is at the MD office visit.          Expected Discharge Plan and Services           Expected Discharge Date: 09/01/20                                     Social Determinants of Health (SDOH) Interventions    Readmission Risk Interventions No flowsheet data found.

## 2020-09-01 NOTE — Progress Notes (Signed)
Subjective: Postpartum Day 4: Cesarean Delivery @ 31.1 complicated by CHF and cardiogenic shock  Patient currently stable and clinically improving in the PCU. She reports feeling well and is asking about when she can go home. She reports incisional pain as well controlled, but does complain of wound vac being cumbersome and limiting her ambulation. Minimal output in wound vac. Denies any dizziness with ambulation. Sparse lochia. Spontaneously voiding without difficulty. Passing flatus, gas pain improved with Simethicone, and +BM yesterday. Tolerating regular diet, denies any N/V. Denies CP or palpitations. Denies any SOB. Denies HA or vision changes.  Baby girl is doing well in the NICU, breathing on own. Using donor breast milk as patient cannot breastfeed on cardiac meds. Denies any problems with engorgement. Patient does report feeling a lot of emotions and is concerned about PP depression. She does become tearful as we're talking. States she has been on Prozac in the past which has helped. Denies any intolerable side effects. Would like to resume at this time.  Objective: Patient Vitals for the past 24 hrs:  BP Temp Temp src Pulse Resp SpO2 Weight  09/01/20 1150 119/79 98.7 F (37.1 C) Oral (!) 112 (!) 28 -- --  09/01/20 0810 -- -- -- -- -- 100 % --  09/01/20 0617 117/80 -- -- -- -- -- --  09/01/20 0418 (!) 101/55 98 F (36.7 C) Oral (!) 102 10 99 % 126.2 kg  08/31/20 2300 (!) 107/58 98.6 F (37 C) Oral (!) 109 19 99 % --  08/31/20 2133 (!) 136/93 -- -- -- -- -- --  08/31/20 2114 -- -- -- -- -- 98 % --  08/31/20 1952 132/83 98 F (36.7 C) Oral -- 19 100 % --  08/31/20 1645 122/80 97.6 F (36.4 C) Oral (!) 108 20 98 % --    Intake/Output Summary (Last 24 hours) at 09/01/2020 1211 Last data filed at 09/01/2020 0434 Gross per 24 hour  Intake 341.37 ml  Output 500 ml  Net -158.63 ml     Physical Exam:  General: alert, cooperative and no distress  Heart: tachycardic, regular  rhythm  Lungs: CTABL no audible rales, wheezes, or crackles Lochia: appropriate Uterine Fundus: firm Incision: (low transverse) healing well- Prevena wound vac in place- no appreciable output in the tubing or canister DVT Evaluation: No evidence of DVT seen on physical exam.- wrapped - neg Homan's b/l  Recent Labs    08/31/20 0443 09/01/20 0445  HGB 10.5* 9.7*  HCT 35.0* 32.7*    Assessment/Plan: Status post Cesarean section complicated by heart failure and cardiogenic shock  [name] [id number] POD#4 sp pLTCS at [redacted]w[redacted]d due to breech presentation in the setting of worsening heart failure and cardiogenic shock.  -Meeting all postoperative and postpartum milestones -Discussed continued wound vac care with plan for removal in office at POD7-10; portable canister ordered -PP Depression- emotional support provided at bedside, discussed risks/benefits/alt to starting antidepressant, especially given pt at high risk of PP depression and not breastfeeding, will restart Prozac at 20mg /d and continue to monitor -Primary care and discharge planning per cardiac care team. Cleared from an OB/GYN and postoperative perspective   [name] 09/01/2020, 12:03 PM

## 2020-09-01 NOTE — Progress Notes (Addendum)
Patient ID: [name], female   DOB: 1980-11-27, 39 y.o.   MRN: [id number]     Advanced Heart Failure Rounding Note  PCP-Cardiologist: Chilton Si, MD   Subjective:   Yesterday transferred to progressive. IV lasix stopped.  Feels good. Wants to go home.    Objective:   Weight Range: 126.2 kg Body mass index is 42.3 kg/m.   Vital Signs:   Temp:  [97.6 F (36.4 C)-98.6 F (37 C)] 98 F (36.7 C) (12/15 0418) Pulse Rate:  [102-112] 102 (12/15 0418) Resp:  [10-27] 10 (12/15 0418) BP: (101-136)/(55-93) 117/80 (12/15 0617) SpO2:  [97 %-100 %] 100 % (12/15 0810) Weight:  [126.2 kg] 126.2 kg (12/15 0418) Last BM Date: 08/31/20  Weight change: Filed Weights   08/30/20 0500 08/31/20 0446 09/01/20 0418  Weight: 127.7 kg 125.2 kg 126.2 kg    Intake/Output:   Intake/Output Summary (Last 24 hours) at 09/01/2020 0957 Last data filed at 09/01/2020 0434 Gross per 24 hour  Intake 341.37 ml  Output 700 ml  Net -358.63 ml      Physical Exam    General:   No resp difficulty HEENT: normal Neck: supple. no JVD. Carotids 2+ bilat; no bruits. No lymphadenopathy or thryomegaly appreciated. Cor: PMI nondisplaced. Regular rate & rhythm. No rubs, gallops or murmurs. Lungs: clear Abdomen: soft, nontender, nondistended. No hepatosplenomegaly. No bruits or masses. Good bowel sounds. Lower abdominal incisional VAC Extremities: no cyanosis, clubbing, rash, R and LLE compression stockings.  Neuro: alert & orientedx3, cranial nerves grossly intact. moves all 4 extremities w/o difficulty. Affect pleasant   Telemetry   ST 100-120 personally reviewed.    Labs    CBC Recent Labs    08/31/20 0443 09/01/20 0445  WBC 17.5* 15.9*  HGB 10.5* 9.7*  HCT 35.0* 32.7*  MCV 77.6* 77.1*  PLT 278 252   Basic Metabolic Panel Recent Labs    64/40/34 0842 09/01/20 0445  NA 141 140  K 4.2 4.2  CL 105 104  CO2 24 26  GLUCOSE 95 100*  BUN 11 9  CREATININE 0.86 0.79  CALCIUM  8.7* 8.7*  MG 1.6*  --    Liver Function Tests No results for input(s): AST, ALT, ALKPHOS, BILITOT, PROT, ALBUMIN in the last 72 hours. No results for input(s): LIPASE, AMYLASE in the last 72 hours. Cardiac Enzymes No results for input(s): CKTOTAL, CKMB, CKMBINDEX, TROPONINI in the last 72 hours.  BNP: BNP (last 3 results) Recent Labs    08/24/20 1242  BNP 241.0*    ProBNP (last 3 results) No results for input(s): PROBNP in the last 8760 hours.   D-Dimer No results for input(s): DDIMER in the last 72 hours. Hemoglobin A1C No results for input(s): HGBA1C in the last 72 hours. Fasting Lipid Panel No results for input(s): CHOL, HDL, LDLCALC, TRIG, CHOLHDL, LDLDIRECT in the last 72 hours. Thyroid Function Tests No results for input(s): TSH, T4TOTAL, T3FREE, THYROIDAB in the last 72 hours.  Invalid input(s): FREET3  Other results:   Imaging    No results found.   Medications:     Scheduled Medications: . acetaminophen  1,000 mg Oral Q6H  . aspirin  81 mg Oral QHS  . budesonide  0.25 mg Nebulization BID  . Chlorhexidine Gluconate Cloth  6 each Topical Daily  . digoxin  0.125 mg Oral Daily  . hydrALAZINE  50 mg Oral Q8H  . ibuprofen  600 mg Oral Q6H  . insulin aspart  0-9 Units Subcutaneous Q4H  .  isosorbide dinitrate  10 mg Oral TID  . prenatal multivitamin  1 tablet Oral Q1200  . sacubitril-valsartan  1 tablet Oral BID  . senna-docusate  2 tablet Oral BID  . simethicone  80 mg Oral Daily  . sodium chloride flush  10-40 mL Intracatheter Q12H  . spironolactone  12.5 mg Oral Daily  . valACYclovir  1,000 mg Oral Daily  . Vitamin D (Ergocalciferol)  50,000 Units Oral Q7 days    Infusions: . sodium chloride Stopped (08/28/20 0837)  . naLOXone [facility]) adult infusion for PRURITIS      PRN Medications: sodium chloride, albuterol, calcium carbonate, coconut oil, witch hazel-glycerin **AND** dibucaine, diphenhydrAMINE **OR** diphenhydrAMINE, hydrALAZINE,  nalbuphine **OR** nalbuphine, naloxone **AND** sodium chloride flush, naLOXone (NARCAN) adult infusion for PRURITIS, ondansetron (ZOFRAN) IV, oxyCODONE, polyethylene glycol, simethicone, sodium chloride flush, zolpidem    Assessment/Plan   1. Acute Biventricular Heart Failure  -> cardiogenic shock - Echo this admit severely reduced EF 15-20% . Possible HTN CM - developed shock physiology on 12/10. MV sat 40%. Moved to ICU - C-section 12/11 without complications. Has incisional VAC.  - milrinone off 12/13 co-ox 68%  - Volume status stable. Hold off on diuretics.  - Tachy today. Will add low dose coreg 3.125 mg twice a day .  - continue hydralazine/nitrates  - Continue Entresto 24/26 bid.  - Continue dig 0.125 mg daily  - Continue spiro 25 mg daily - Renal function stable.   2. HTN -Stable.   3. AKI - due to ATN, cardiorenal - resolved  4. Hypokalemia/hypomag - Stable.   5. DM2 - SSI  6. OSA - Recently diagnosed. CPAP   7. DVT prophylaxis: Heparin SQ    Length of Stay: 8  Amy Clegg, NP  09/01/2020, 9:57 AM  Advanced Heart Failure Team Pager [phone] (M-F; 7a - 4p)  Please contact [facility] for night-coverage after hours (4p -7a ) and weekends on [email or web address]   Patient seen and examined with the above-signed Advanced Practice Provider and/or Housestaff. I personally reviewed laboratory data, imaging studies and relevant notes. I independently examined the patient and formulated the important aspects of the plan. I have edited the note to reflect any of my changes or salient points. I have personally discussed the plan with the patient and/or family.  Feels much better. Wants to go home. HR still high  General:  Well appearing. No resp difficulty HEENT: normal Neck: supple. no JVD. Carotids 2+ bilat; no bruits. No lymphadenopathy or thryomegaly appreciated. Cor: PMI nondisplaced. Regular rate & rhythm. No rubs, gallops or murmurs. Lungs: clear Abdomen: obese  soft, nontender, nondistended. No hepatosplenomegaly. No bruits or masses. Good bowel sounds. + wound vac Extremities: no cyanosis, clubbing, rash, edema Neuro: alert & orientedx3, cranial nerves grossly intact. moves all 4 extremities w/o difficulty. Affect pleasant  Ok for d/c home today on current meds. Can go home today on current meds. Would give lasix for prn use. F/u in HF Clinic next week.   Arvilla Meres, MD  4:28 PM

## 2020-09-01 NOTE — Discharge Summary (Addendum)
Advanced Heart Failure Team  Discharge Summary   Patient ID: [name] MRN: [id number], DOB/AGE: 1981/02/09 39 y.o. Admit date: 08/24/2020 D/C date:     09/01/2020   Primary Discharge Diagnoses:  1. 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 HTN  3. AKI  4. Hypokalemia/Hypomag 5. DMII 6. OSA 7. DVT Prophylaxis 8. Cesarean Section 08/28/20  Hospital Course:  Ms [name] is a 39 year old [redacted] weeks pregnant and history of cardiomegaly, DM, HTN, asthma, OSA, and obesity.    Admitted with hypertension and lower extremity edema and found to have acute systolic heart failure. Echo completed and showed biventricular dysfunction with EF 15-20%. Cardiology consulted. Started on IV lasix and HF meds compatible with pregnancy.  Due to severely reduced EF, HF team consulted. Due to sluggish diuresis and worsening shortness of breath, PICC line was place and showed severely reduced CO-OX which was consistent with cardiogenic shock. Transferred 2H for shock management and initiation of milrinone.    Due to maternal/fetal risk, cesarean section was performed on 08/28/2020 with delivery of premature female. Incisional VAC placed and will remain for 10 days.Post op course included diuresis, weaning inotropes, and initiation GDMT for HF.At the time of discharge she was tolerating a diet, pain was controlled, and ambulating independently.   She received coordinated care from OB/GYN service and Advanced Heart Failure Team throughout hospitalization. She will follow up with OB/GYN next week for VAD removal. She will be followed closeley by the HF team. HF meds provided by [facility] pharmacy.    Discharge Vitals: Blood pressure 119/79, pulse 90, temperature 98.7 F (37.1 C), temperature source Oral, resp. rate (!) 28, height 5\' 8"  (1.727 m), weight 126.2 kg, last menstrual period 01/19/2020, SpO2 100 %, unknown if currently breastfeeding.  Labs: Lab Results  Component Value Date   WBC 15.9 (H) 09/01/2020   HGB  9.7 (L) 09/01/2020   HCT 32.7 (L) 09/01/2020   MCV 77.1 (L) 09/01/2020   PLT 252 09/01/2020    Recent Labs  Lab 09/01/20 0445  NA 140  K 4.2  CL 104  CO2 26  BUN 9  CREATININE 0.79  CALCIUM 8.7*  GLUCOSE 100*   Lab Results  Component Value Date   CHOL 175 09/02/2019   HDL 57 09/02/2019   LDLCALC 104 (H) 09/02/2019   TRIG 58 09/02/2019   BNP (last 3 results) Recent Labs    08/24/20 1242  BNP 241.0*    ProBNP (last 3 results) No results for input(s): PROBNP in the last 8760 hours.   Diagnostic Studies/Procedures   Recent Results (from the past 14/07/21 hour(s))  ECHOCARDIOGRAM COMPLETE   Collection Time: 08/24/20  5:51 PM  Result Value   Weight 4,697.6   Height 68   BP 133/97   S' Lateral 5.56   AR max vel 1.53   AV Area VTI 1.46   AV Mean grad 2.0   AV Peak grad 2.8   Ao pk vel 0.84   Radius 0.40   MV M vel 4.26   AV Area mean vel 1.43   MV Peak grad 72.6   Narrative      ECHOCARDIOGRAM REPORT       Patient Name:   [name] Date of Exam: 08/24/2020 Medical Rec #:  14/03/[id number]         Height:       68.0 in Accession #:    [id number]        Weight:  293.6 lb Date of Birth:  11-Aug-1981         BSA:          2.406 m Patient Age:    39 years          BP:           133/97 mmHg Patient Gender: F                 HR:           108 bpm. Exam Location:  Inpatient  Procedure: 2D Echo, Cardiac Doppler, Color Doppler and Intracardiac            Opacification Agent  Indications:    CHF-Acute diastolic   History:        Patient has prior history of Echocardiogram examinations, most                 recent 07/22/2009. CHF, Signs/Symptoms:Shortness of Breath; Risk                 Factors:Sleep Apnea.   Sonographer:    Ross Ludwig RDCS (AE) Referring Phys: [id number] [facility]    Sonographer Comments: Patient is morbidly obese. IMPRESSIONS    1. There is no left ventricular thrombus with Definity contrast. Left ventricular ejection fraction, by  estimation, is 15-20%. The left ventricle has severely decreased function. The left ventricle demonstrates global hypokinesis. The left ventricular  internal cavity size was moderately dilated. Left ventricular diastolic parameters are consistent with Grade III diastolic dysfunction (restrictive). Elevated left atrial pressure.  2. Right ventricular systolic function is moderately reduced. The right ventricular size is normal. There is moderately elevated pulmonary artery systolic pressure. The estimated right ventricular systolic pressure is 45.0 mmHg.  3. Left atrial size was severely dilated.  4. Right atrial size was moderately dilated.  5. The pericardial effusion is circumferential.  6. The mitral valve is normal in structure. Mild to moderate mitral valve regurgitation.  7. The aortic valve is normal in structure. Aortic valve regurgitation is not visualized. No aortic stenosis is present.  8. The inferior vena cava is dilated in size with <50% respiratory variability, suggesting right atrial pressure of 15 mmHg.  FINDINGS  Left Ventricle: There is no left ventricular thrombus with Definity contrast. Left ventricular ejection fraction, by estimation, is 15-20%. The left ventricle has severely decreased function. The left ventricle demonstrates global hypokinesis. Definity  contrast agent was given IV to delineate the left ventricular endocardial borders. The left ventricular internal cavity size was moderately dilated. There is no left ventricular hypertrophy. Left ventricular diastolic parameters are consistent with Grade  III diastolic dysfunction (restrictive). Elevated left atrial pressure.  Right Ventricle: The right ventricular size is normal. No increase in right ventricular wall thickness. Right ventricular systolic function is moderately reduced. There is moderately elevated pulmonary artery systolic pressure. The tricuspid regurgitant  velocity is 2.74 m/s, and with an assumed right  atrial pressure of 15 mmHg, the estimated right ventricular systolic pressure is 45.0 mmHg.  Left Atrium: Left atrial size was severely dilated.  Right Atrium: Right atrial size was moderately dilated.  Pericardium: Trivial pericardial effusion is present. The pericardial effusion is circumferential.  Mitral Valve: The mitral valve is normal in structure. Mild to moderate mitral valve regurgitation, with centrally-directed jet.  Tricuspid Valve: The tricuspid valve is normal in structure. Tricuspid valve regurgitation is mild.  Aortic Valve: The aortic valve is normal in structure. Aortic valve regurgitation is not visualized. No  aortic stenosis is present. Aortic valve mean gradient measures 2.0 mmHg. Aortic valve peak gradient measures 2.8 mmHg. Aortic valve area, by VTI  measures 1.46 cm.  Pulmonic Valve: The pulmonic valve was grossly normal. Pulmonic valve regurgitation is trivial.  Aorta: The aortic root and ascending aorta are structurally normal, with no evidence of dilitation.  Venous: The inferior vena cava is dilated in size with less than 50% respiratory variability, suggesting right atrial pressure of 15 mmHg.  IAS/Shunts: No atrial level shunt detected by color flow Doppler.    LEFT VENTRICLE PLAX 2D LVIDd:         6.18 cm LVIDs:         5.56 cm LV PW:         1.10 cm LV IVS:        1.10 cm LVOT diam:     1.80 cm LV SV:         18 LV SV Index:   7 LVOT Area:     2.54 cm    RIGHT VENTRICLE            IVC RV Basal diam:  3.40 cm    IVC diam: 2.70 cm RV S prime:     6.46 cm/s TAPSE (M-mode): 1.1 cm  LEFT ATRIUM             Index       RIGHT ATRIUM           Index LA diam:        4.50 cm 1.87 cm/m  RA Area:     23.90 cm LA Vol (A2C):   91.6 ml 38.07 ml/m RA Volume:   72.40 ml  30.09 ml/m LA Vol (A4C):   97.6 ml 40.57 ml/m LA Biplane Vol: 99.4 ml 41.31 ml/m  AORTIC VALVE AV Area (Vmax):    1.53 cm AV Area (Vmean):   1.43 cm AV Area (VTI):     1.46  cm AV Vmax:           84.30 cm/s AV Vmean:          61.300 cm/s AV VTI:            0.122 m AV Peak Grad:      2.8 mmHg AV Mean Grad:      2.0 mmHg LVOT Vmax:         50.80 cm/s LVOT Vmean:        34.400 cm/s LVOT VTI:          0.070 m LVOT/AV VTI ratio: 0.57   AORTA Ao Root diam: 2.50 cm Ao Asc diam:  2.80 cm  MR Peak grad:    72.6 mmHg   TRICUSPID VALVE MR Mean grad:    45.0 mmHg   TR Peak grad:   30.0 mmHg MR Vmax:         426.00 cm/s TR Vmax:        274.00 cm/s MR Vmean:        311.0 cm/s MR PISA:         1.01 cm    SHUNTS MR PISA Eff ROA: 9 mm       Systemic VTI:  0.07 m MR PISA Radius:  0.40 cm     Systemic Diam: 1.80 cm  Rachelle Hora Croitoru MD Electronically signed by Thurmon Fair MD Signature Date/Time: 08/24/2020/6:09:06 PM       Final     *Note: Due to a large number of results and/or encounters  for the requested time period, some results have not been displayed. A complete set of results can be found in Results Review.     Discharge Medications   Allergies as of 09/01/2020      Reactions   Clomiphene Hives   Hives and lips swelling.      Medication List    STOP taking these medications   clomiPHENE 50 MG tablet Commonly known as: CLOMID   cyclobenzaprine 10 MG tablet Commonly known as: FLEXERIL   ibuprofen 200 MG tablet Commonly known as: ADVIL   labetalol 100 MG tablet Commonly known as: NORMODYNE   letrozole 2.5 MG tablet Commonly known as: Femara   NIFEdipine 30 MG 24 hr tablet Commonly known as: PROCARDIA-XL/NIFEDICAL-XL   nitrofurantoin 50 MG capsule Commonly known as: Macrodantin   nystatin 100000 UNIT/ML suspension Commonly known as: MYCOSTATIN     TAKE these medications   albuterol 108 (90 Base) MCG/ACT inhaler Commonly known as: ProAir HFA Inhale 2 puffs into the lungs every 6 (six) hours as needed.   albuterol 108 (90 Base) MCG/ACT inhaler Commonly known as: VENTOLIN HFA Inhale 2 puffs into the lungs every 6 (six) hours  as needed for wheezing or shortness of breath.   ALPRAZolam 0.25 MG tablet Commonly known as: XANAX Take 1 tablet (0.25 mg total) by mouth 3 (three) times daily as needed for sleep.   aspirin 81 MG chewable tablet Chew 81 mg by mouth daily.   calcium carbonate 500 MG chewable tablet Commonly known as: TUMS - dosed in mg elemental calcium Chew 1-2 tablets by mouth daily as needed for indigestion or heartburn.   carvedilol 3.125 MG tablet Commonly known as: COREG Take 1 tablet (3.125 mg total) by mouth 2 (two) times daily with a meal.   digoxin 0.125 MG tablet Commonly known as: LANOXIN Take 1 tablet (0.125 mg total) by mouth daily. Start taking on: September 02, 2020   diphenhydramine-acetaminophen 25-500 MG Tabs tablet Commonly known as: TYLENOL PM Take 1 tablet by mouth at bedtime as needed (sleep).   ergocalciferol 1.25 MG (50000 UT) capsule Commonly known as: VITAMIN D2 Take 1,250 mcg by mouth daily. What changed: Another medication with the same name was removed. Continue taking this medication, and follow the directions you see here.   FLUoxetine 20 MG capsule Commonly known as: PROZAC Take 1 capsule (20 mg total) by mouth daily.   furosemide 20 MG tablet Commonly known as: Lasix Take 1 tablet (20 mg total) by mouth daily.   hydrALAZINE 50 MG tablet Commonly known as: APRESOLINE Take 1 tablet (50 mg total) by mouth every 8 (eight) hours.   isosorbide dinitrate 10 MG tablet Commonly known as: ISORDIL Take 1 tablet (10 mg total) by mouth 3 (three) times daily.   Lantus SoloStar 100 UNIT/ML Solostar Pen Generic drug: insulin glargine Inject 32 Units into the skin daily.   metFORMIN 500 MG 24 hr tablet Commonly known as: GLUCOPHAGE-XR TAKE 1 TABLET (500 MG TOTAL) BY MOUTH DAILY WITH BREAKFAST.   prenatal multivitamin Tabs tablet Take 1 tablet by mouth daily at 12 noon.   Pulmicort Flexhaler 180 MCG/ACT inhaler Generic drug: budesonide Inhale 2 puffs into  the lungs every 12 (twelve) hours.   sacubitril-valsartan 24-26 MG Commonly known as: ENTRESTO Take 1 tablet by mouth 2 (two) times daily.   senna-docusate 8.6-50 MG tablet Commonly known as: Senokot-S Take 2 tablets by mouth 2 (two) times daily.   spironolactone 25 MG tablet Commonly known as: ALDACTONE Take  0.5 tablets (12.5 mg total) by mouth daily. Start taking on: September 02, 2020   traMADol 50 MG tablet Commonly known as: ULTRAM Take by mouth.   valACYclovir 1000 MG tablet Commonly known as: VALTREX Take 1 tablet (1,000 mg total) by mouth daily.   witch hazel-glycerin pad Commonly known as: TUCKS Apply 1 application topically as needed for hemorrhoids.            Durable Medical Equipment  (From admission, onward)         Start     Ordered   09/01/20 1156  For home use only DME Negative pressure wound device  Once       Comments: Prevena wound vac in place, patient needing portable unit for discharge planning  Question Answer Comment  Frequency of dressing change Other see comments   Length of need Other see comments   Dressing type Gauze   Amount of suction 125 mm/Hg   Pressure application Continuous pressure   Supplies 10 canisters and 15 dressings per month for duration of therapy      09/01/20 1201          Disposition   The patient will be discharged in stable condition to home. Discharge Instructions    (HEART FAILURE PATIENTS) Call MD:  Anytime you have any of the following symptoms: 1) 3 pound weight gain in 24 hours or 5 pounds in 1 week 2) shortness of breath, with or without a dry hacking cough 3) swelling in the hands, feet or stomach 4) if you have to sleep on extra pillows at night in order to breathe.   Complete by: As directed    Diet - low sodium heart healthy   Complete by: As directed    Heart Failure patients record your daily weight using the same scale at the same time of day   Complete by: As directed    Increase activity  slowly   Complete by: As directed       Follow-up Information    [facility] Follow up on 09/09/2020.   Specialty: Cardiology Why: Located on the 1st floor of [facility]. Entrance C. Garage Code 3007. at 1100 Contact information: [address] [id number] [id number] [place] [address] [phone]       Noland Fordyce, MD. Call on 09/06/2020.   Specialty: Obstetrics and Gynecology Why: at 8:45 Contact information: [address] [place]uro K[place] [address] [phone]                 Duration of Discharge Encounter: Greater than 35 minutes   Signed, Tonye Becket  09/01/2020, 2:28 PM  Patient seen and examined with the above-signed Advanced Practice Provider and/or Housestaff. I personally reviewed laboratory data, imaging studies and relevant notes. I independently examined the patient and formulated the important aspects of the plan. I have edited the note to reflect any of my changes or salient points. I have personally discussed the plan with the patient and/or family.  Ok for d/c home today on current meds. Can go home today on current meds. Would give lasix for prn use. F/u in HF Clinic next week.   Arvilla Meres, MD  4:30 PM

## 2020-09-01 NOTE — Plan of Care (Signed)
  Problem: Education: Goal: Knowledge of General Education information will improve Description: Including pain rating scale, medication(s)/side effects and non-pharmacologic comfort measures Outcome: Adequate for Discharge   Problem: Health Behavior/Discharge Planning: Goal: Ability to manage health-related needs will improve Outcome: Adequate for Discharge   Problem: Clinical Measurements: Goal: Ability to maintain clinical measurements within normal limits will improve Outcome: Adequate for Discharge Goal: Will remain free from infection Outcome: Adequate for Discharge Goal: Diagnostic test results will improve Outcome: Adequate for Discharge Goal: Respiratory complications will improve Outcome: Adequate for Discharge Goal: Cardiovascular complication will be avoided Outcome: Adequate for Discharge   Problem: Activity: Goal: Risk for activity intolerance will decrease Outcome: Adequate for Discharge   Problem: Coping: Goal: Level of anxiety will decrease Outcome: Adequate for Discharge   Problem: Elimination: Goal: Will not experience complications related to bowel motility Outcome: Adequate for Discharge   Problem: Pain Managment: Goal: General experience of comfort will improve Outcome: Adequate for Discharge   Problem: Safety: Goal: Ability to remain free from injury will improve Outcome: Adequate for Discharge   Problem: Skin Integrity: Goal: Risk for impaired skin integrity will decrease Outcome: Adequate for Discharge   Problem: Education: Goal: Knowledge of disease or condition will improve Outcome: Adequate for Discharge Goal: Knowledge of the prescribed therapeutic regimen will improve Outcome: Adequate for Discharge Goal: Individualized Educational Video(s) Outcome: Adequate for Discharge   Problem: Clinical Measurements: Goal: Complications related to the disease process, condition or treatment will be avoided or minimized Outcome: Adequate for  Discharge   Problem: Education: Goal: Knowledge of disease or condition will improve Outcome: Adequate for Discharge Goal: Knowledge of the prescribed therapeutic regimen will improve Outcome: Adequate for Discharge   Problem: Fluid Volume: Goal: Peripheral tissue perfusion will improve Outcome: Adequate for Discharge   Problem: Clinical Measurements: Goal: Complications related to disease process, condition or treatment will be avoided or minimized Outcome: Adequate for Discharge

## 2020-09-02 ENCOUNTER — Other Ambulatory Visit (HOSPITAL_COMMUNITY): Payer: Self-pay | Admitting: Obstetrics

## 2020-09-02 DIAGNOSIS — K640 First degree hemorrhoids: Secondary | ICD-10-CM | POA: Diagnosis not present

## 2020-09-02 DIAGNOSIS — O900 Disruption of cesarean delivery wound: Secondary | ICD-10-CM | POA: Diagnosis not present

## 2020-09-02 MED FILL — UNIFINE PENTIPS 32GX5/32: 32G X 4 MM | 90 days supply | Qty: 100 | Fill #1

## 2020-09-02 MED FILL — OXYCODONE-APAP 5-325MG: 5-325 | 5 days supply | Qty: 30 | Fill #0

## 2020-09-02 MED FILL — PROCTOZONE-HC 2.5 % CREA: 2.5 | 10 days supply | Qty: 30 | Fill #0

## 2020-09-04 ENCOUNTER — Other Ambulatory Visit: Payer: Self-pay

## 2020-09-04 ENCOUNTER — Inpatient Hospital Stay (HOSPITAL_COMMUNITY)
Admission: AD | Admit: 2020-09-04 | Discharge: 2020-09-04 | Disposition: A | Discharge status: Home / Self Care | Payer: BC Managed Care – PPO | Attending: Obstetrics & Gynecology | Admitting: Obstetrics & Gynecology

## 2020-09-04 DIAGNOSIS — Z7982 Long term (current) use of aspirin: Secondary | ICD-10-CM | POA: Insufficient documentation

## 2020-09-04 DIAGNOSIS — Z4689 Encounter for fitting and adjustment of other specified devices: Secondary | ICD-10-CM | POA: Insufficient documentation

## 2020-09-04 DIAGNOSIS — Z4889 Encounter for other specified surgical aftercare: Secondary | ICD-10-CM | POA: Diagnosis not present

## 2020-09-04 DIAGNOSIS — Z79899 Other long term (current) drug therapy: Secondary | ICD-10-CM | POA: Insufficient documentation

## 2020-09-04 NOTE — MAU Note (Signed)
Pt woke up to use the bathroom and felt leaking from her incision. Has a wound vac which is not pulling much fluid and beeping. No pain.

## 2020-09-04 NOTE — Discharge Instructions (Signed)
Negative Pressure Wound Therapy Home Guide °Negative pressure wound therapy (NPWT) uses a sponge or foam-like material (dressing) placed on or inside the wound. The wound is then covered and sealed with a cover dressing that sticks to your skin (is adhesive). This keeps air out. A tube is attached to the cover dressing, and this tube connects to a small pump. The pump sucks fluid and germs from the wound. NPWT helps to increase blood flow to the wound and heal it from the inside. °What are the risks? °NPWT is usually safe to use. However, problems can occur, including: °· Skin irritation from the dressing adhesive. °· Bleeding. °· Infection. °· Dehydration. Wounds with large amounts of drainage can cause excessive fluid loss. °· Pain. °Supplies needed: °· A disposable garbage bag. °· Soap and water, or hand sanitizer. °· Wound cleanser or salt-water solution (saline). °· New sponge and cover dressing. °· Protective clothing. °· Gauze pad. °· Vinyl gloves. °· Tape. °· Skin protectant. This may be a wipe, film, or spray. °· Clean or germ-free (sterile) scissors. °· Eye protection. °How to change your dressing °Prepare to change your dressing ° °1. If told by your health care provider, take pain medicine 30 minutes before changing the dressing. °2. Wash your hands with soap and water. Dry your hands with a clean towel. If soap and water are not available, use hand sanitizer. °3. Set up a clean station for wound care. °4. Open the dressing package so that the sponge dressing remains on the inside of the package. °5. Wear gloves, protective clothing, and eye protection. °Remove old dressing ° °1. Turn off the pump and disconnect the tubing from the dressing. °2. Carefully remove the adhesive cover dressing in the direction of your hair growth. °3. Remove the sponge dressing that is inside the wound. If the sponge sticks, use a wound cleanser or saline solution to wet the sponge and help it come off more easily. °4. Throw  the old sponge and cover dressing supplies into the garbage bag. °5. Remove your gloves by grabbing the cuff and turning the glove inside out. Place the gloves in the trash immediately. °6. Wash your hands with soap and water. Dry your hands with a clean towel. If soap and water are not available, use hand sanitizer. °Clean your wound °· Wear gloves, protective clothing, and eye protection. °Follow your health care provider's instructions on how to clean your wound. You may be told to: °1. Clean the wound using a saline solution or a wound cleanser and a clean gauze pad. °2. Pat the wound dry with a gauze pad. Do not rub the wound. °3. Throw the gauze pad into the garbage bag. °4. Remove your gloves by grabbing the cuff and turning the glove inside out. Place the gloves in the trash immediately. °5. Wash your hands with soap and water. Dry your hands with a clean towel. If soap and water are not available, use hand sanitizer. °Apply new dressing °· Wear gloves, protective clothing, and eye protection. °1. If told by your health care provider, apply a skin protectant to any skin that will be exposed to adhesive. Let the skin protectant dry. °2. Cut a piece of new sponge dressing and put it on or in the wound. °3. Using clean scissors, cut a nickel-sized hole in the new cover dressing. °4. Apply the cover dressing. °5. Attach the suction tube over the hole in the cover dressing. °6. Take off your gloves. Put them in the   plastic bag with the old dressing. Tie the bag shut and throw it away. °7. Wash your hands with soap and water. Dry your hands with a clean towel. If soap and water are not available, use hand sanitizer. °8. Turn the pump back on. The sponge dressing should collapse. Do not change the settings on the machine without talking to a health care provider. °9. Replace the container in the pump that collects fluid if it is full. Replace the container per the manufacturer's instructions or at least once a  week, even if it is not full. °General tips and recommendations °If the alarm sounds: °· Stay calm. °· Do not turn off the pump or do anything with the dressing. °· Reasons the alarm may go off: °? The battery is low. Change the battery or plug the device into electrical power. °? The dressing has a leak. Find the leak and put tape over the leak. °? The fluid collection container is full. Change the fluid container. °· Call your health care provider right away if you cannot fix the problem. °· Explain to your health care provider what is happening. Follow his or her instructions. °General instructions °· Do not turn off the pump unless told to do so by your health care provider. °· Do not turn off the pump for more than 2 hours. If the pump is off for more than 2 hours, the dressing will need to be changed. °· If your health care provider says it is okay to shower: °? Do not take the pump into the shower. °? Make sure the wound dressing is protected and sealed. The wound dressing must stay dry. °· Check frequently that the machine indicates that therapy is on and that all clamps are open. °· Do not use over-the-counter medicated or antiseptic creams, sprays, liquids, or dressings unless your health care provider approves. °Contact a health care provider if: °· You have new pain. °· You develop irritation, a rash, or itching around the wound or dressing. °· You see new black or yellow tissue in your wound. °· The dressing changes are painful or cause bleeding. °· The pump has been off for more than 2 hours, and you do not know how to change the dressing. °· The pump alarm goes off, and you do not know what to do. °Get help right away if: °· You have a lot of bleeding. °· The wound breaks open. °· You have severe pain. °· You have signs of infection, such as: °? More redness, swelling, or pain. °? More fluid or blood. °? Warmth. °? Pus or a bad smell. °? Red streaks leading from the wound. °? A fever. °· You see a  sudden change in the color or texture of the drainage. °· You have signs of dehydration, such as: °? Little or no tears, urine, or sweat. °? Muscle cramps. °? Very dry mouth. °? Headache. °? Dizziness. °Summary °· Negative pressure wound therapy (NPWT) is a device that helps your wound heal. °· Set up a clean station for wound care. Your health care provider will tell you what supplies to use. °· Follow your health care provider's instructions on how to clean your wound and how to change the dressing. °· Contact a health care provider if you have new pain, an irritation, or a rash, or if the alarm goes off and you do not know what to do. °· Get help right away if you have a lot of bleeding, your wound breaks   open, or you have severe pain. Also, get help if you have signs of infection. °This information is not intended to replace advice given to you by your health care provider. Make sure you discuss any questions you have with your health care provider. °Document Revised: 12/27/2018 Document Reviewed: 11/22/2018 °[employer] Patient Education © 2020 [employer] ° °

## 2020-09-04 NOTE — MAU Note (Signed)
Wound vac removed by OR nurse, honeycomb dressing applied with abd and pressure tape per Dr. Barb Merino. Pt has follow up with OB/GYN on Monday. Incision was clean, and intact with minimal drainage.

## 2020-09-04 NOTE — MAU Provider Note (Signed)
History     CSN: [id number]  Arrival date and time: 09/04/20 0455   Event Date/Time   First Provider Initiated Contact with Patient 09/04/20 0524      Chief Complaint  Patient presents with  . Wound Check   [name] is a 39 y.o. G1P0101 at ppartum who presents to MAU for check of wound vac. Patient reports around 4AM this morning she felt fluid coming from her wound vac. Patient also reports that when she arrived to the MAU she felt the machine was malfunctioning as it was beeping at her. Patient denies fever, chills, pain or other s/sx of infection. Patient is s/p 30 week emergency C/S.   OB History    Gravida  1   Para  1   Term  0   Preterm  1   AB  0   Living  1     SAB  0   IAB  0   Ectopic  0   Multiple  0   Live Births  1           Past Medical History:  Diagnosis Date  . Anemia   . Asthma   . Genital herpes   . Gestational diabetes   . Obesity   . Pre-diabetes   . Thyroid disease     Past Surgical History:  Procedure Laterality Date  . CESAREAN SECTION N/A 08/28/2020   Procedure: CESAREAN SECTION;  Surgeon: Olivia Mackie, MD;  Location: [facility];  Service: Obstetrics;  Laterality: N/A;  . DILATATION & CURETTAGE/HYSTEROSCOPY WITH MYOSURE N/A 09/24/2018   Procedure: DILATATION & CURETTAGE/HYSTEROSCOPY WITH MYOSURE POLYPECTOMY;  Surgeon: [name], MD;  Location: [facility] ORS;  Service: Gynecology;  Laterality: N/A;  . KELOID EXCISION  2008   removed @[facility] ----- 10/2011  . KELOID EXCISION  09/20/2018   behind right ear  . 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 Hyperlipidemia Mother   . Hypertension Mother   . Depression Father   . Hyperlipidemia Father   . Heart disease Father 44       MI  . Hypertension Father   . Cancer Father 6       prostate  . Cancer Paternal Grandfather        prostate  . Coronary artery disease Other   .  Prostate cancer Other   . Cancer Maternal Grandfather        prostate    Social History   Tobacco Use  . Smoking status: Never Smoker  . Smokeless tobacco: Never Used  Vaping Use  . Vaping Use: Never used  Substance Use Topics  . Alcohol use: Yes    Comment: SOCIAL   . Drug use: No    Allergies:  Allergies  Allergen Reactions  . Clomiphene Hives    Hives and lips swelling.    Medications Prior to Admission  Medication Sig Dispense Refill Last Dose  . albuterol (PROAIR HFA) 108 (90 Base) MCG/ACT inhaler Inhale 2 puffs into the lungs every 6 (six) hours as needed. (Patient not taking: Reported on 08/25/2020) 6.7 g 1   . albuterol (VENTOLIN HFA) 108 (90 Base) MCG/ACT inhaler Inhale 2 puffs into the lungs every 6 (six) hours as needed for wheezing or shortness of breath. 8 g 6   . ALPRAZolam (XANAX) 0.25 MG tablet Take 1 tablet (0.25 mg total) by mouth 3 (three) times  daily as needed for sleep. (Patient not taking: Reported on 08/19/2020) 30 tablet 0   . aspirin 81 MG chewable tablet Chew 81 mg by mouth daily.     . budesonide (PULMICORT FLEXHALER) 180 MCG/ACT inhaler Inhale 2 puffs into the lungs every 12 (twelve) hours. 1 each 3   . calcium carbonate (TUMS - DOSED IN MG ELEMENTAL CALCIUM) 500 MG chewable tablet Chew 1-2 tablets by mouth daily as needed for indigestion or heartburn.     . carvedilol (COREG) 3.125 MG tablet Take 1 tablet (3.125 mg total) by mouth 2 (two) times daily with a meal. 60 tablet 6   . digoxin (LANOXIN) 0.125 MG tablet Take 1 tablet (0.125 mg total) by mouth daily. 30 tablet 6   . diphenhydramine-acetaminophen (TYLENOL PM) 25-500 MG TABS tablet Take 1 tablet by mouth at bedtime as needed (sleep).     . ergocalciferol (VITAMIN D2) 1.25 MG (50000 UT) capsule Take 1,250 mcg by mouth daily.     [name] FLUoxetine (PROZAC) 20 MG capsule Take 1 capsule (20 mg total) by mouth daily. 30 capsule 3   . furosemide (LASIX) 20 MG tablet Take 1 tablet (20 mg total) by mouth  daily. 30 tablet 6   . hydrALAZINE (APRESOLINE) 50 MG tablet Take 1 tablet (50 mg total) by mouth every 8 (eight) hours. 90 tablet 6   . isosorbide dinitrate (ISORDIL) 10 MG tablet Take 1 tablet (10 mg total) by mouth 3 (three) times daily. 90 tablet 6   . LANTUS SOLOSTAR 100 UNIT/ML Solostar Pen Inject 32 Units into the skin daily.      . metFORMIN (GLUCOPHAGE-XR) 500 MG 24 hr tablet TAKE 1 TABLET (500 MG TOTAL) BY MOUTH DAILY WITH BREAKFAST. (Patient not taking: Reported on 08/19/2020) 90 tablet 1   . Prenatal Vit-Fe Fumarate-FA (PRENATAL MULTIVITAMIN) TABS tablet Take 1 tablet by mouth daily at 12 noon.     . sacubitril-valsartan (ENTRESTO) 24-26 MG Take 1 tablet by mouth 2 (two) times daily. 60 tablet 6   . senna-docusate (SENOKOT-S) 8.6-50 MG tablet Take 2 tablets by mouth 2 (two) times daily. 60 tablet 6   . spironolactone (ALDACTONE) 25 MG tablet Take 0.5 tablets (12.5 mg total) by mouth daily. 30 tablet 6   . traMADol (ULTRAM) 50 MG tablet Take by mouth. (Patient not taking: Reported on 08/19/2020)     . valACYclovir (VALTREX) 1000 MG tablet Take 1 tablet (1,000 mg total) by mouth daily. 90 tablet 3   . witch hazel-glycerin (TUCKS) pad Apply 1 application topically as needed for hemorrhoids. 40 each 12     Review of Systems  Constitutional: Negative for chills, diaphoresis, fatigue and fever.  Eyes: Negative for visual disturbance.  Respiratory: Negative for shortness of breath.   Cardiovascular: Negative for chest pain.  Gastrointestinal: Negative for abdominal pain, constipation, diarrhea, nausea and vomiting.  Genitourinary: Negative for dysuria, flank pain, frequency, pelvic pain, urgency, vaginal bleeding and vaginal discharge.  Neurological: Negative for dizziness, weakness, light-headedness and headaches.   Physical Exam   Blood pressure 112/84, pulse (!) 107, temperature 98.7 F (37.1 C), temperature source Oral, resp. rate 16, height 5\' 8"  (1.727 m), weight 119.7 kg, SpO2 100  %, unknown if currently breastfeeding.  Physical Exam Vitals and nursing note reviewed.  Constitutional:      General: She is not in acute distress.    Appearance: Normal appearance. She is not ill-appearing or diaphoretic.  HENT:     Head: Normocephalic and atraumatic.  Pulmonary:  Effort: Pulmonary effort is normal.  Skin:    General: Skin is warm and dry.     Comments: On examination, wound vac functioning well, dry, clean and well-adhered to skin. Small area in right lower section minimally peeling away from skin, but not compromising the integrity of suction. Fluid noted in suction canister. Called Dr. Barb Merino for assistance and additional evaluation of wound vac. Dr. Barb Merino to bedside and trims rolled edges of tegederm and reinforced edges with additional tegederm. Patient in lying and standing position without malfunction of device.  Neurological:     Mental Status: She is alert and oriented to person, place, and time.  Psychiatric:        Mood and Affect: Mood normal.        Behavior: Behavior normal.        Thought Content: Thought content normal.        Judgment: Judgment normal.    No results found for this or any previous visit (from the past 24 hour(s)).  DG Chest 1 View  Result Date: 08/25/2020 CLINICAL DATA:  CHF, pregnant, asthma EXAM: CHEST  1 VIEW COMPARISON:  Portable exam 0655 hours compared to 08/24/2020 FINDINGS: Enlargement of cardiac silhouette with pulmonary vascular congestion. Mediastinal contours normal. Slightly improved interstitial infiltrate, likely improved edema. No pleural effusion or pneumothorax. IMPRESSION: Slightly improved probable pulmonary edema. Electronically Signed   By: Ulyses Southward M.D.   On: 08/25/2020 08:27   CT Angio Chest PE W and/or Wo Contrast  Result Date: 08/24/2020 CLINICAL DATA:  39 year old female pregnant female in the 3rd trimester with progressive shortness of breath. EXAM: CT ANGIOGRAPHY CHEST WITH CONTRAST TECHNIQUE:  Multidetector CT imaging of the chest was performed using the standard protocol during bolus administration of intravenous contrast. Multiplanar CT image reconstructions and MIPs were obtained to evaluate the vascular anatomy. CONTRAST:  75mL OMNIPAQUE IOHEXOL 350 MG/ML SOLN COMPARISON:  Portable chest radiograph earlier today. FINDINGS: Cardiovascular: Good contrast bolus timing in the pulmonary arterial tree. Mild respiratory motion. No focal filling defect identified in the pulmonary arteries to suggest acute pulmonary embolism. Mild cardiomegaly with small volume pericardial effusion and pericardial fluid tracking into the superior mediastinum. No contrast in the aorta on this exam. Mediastinum/Nodes: Small volume of pericardial effusion suspected tracking into the superior pericardial recesses. No mediastinal mass or lymphadenopathy is evident. Lungs/Pleura: Layering right greater than left pleural effusions with simple fluid density suggesting transudate (series 6, image 57). Up to moderate volume effusion on the right. Partially sub pulmonic effusion on the left. Major airways are patent. Confluent bilateral pulmonary ground-glass opacity with mosaic attenuation at the lung bases (series 8, image 78). There are some areas of more confluent peribronchial ground-glass opacity but no consolidation. Upper Abdomen: Trace contrast reflux into the hepatic IVC and hepatic veins. Negative visible spleen and stomach. Musculoskeletal: No acute osseous abnormality identified. Review of the MIP images confirms the above findings. IMPRESSION: 1. Negative for acute pulmonary embolus. 2. Constellation favoring cardiogenic pulmonary edema: - mild cardiomegaly with small pericardial effusion, - right greater than left layering pleural effusions, - diffuse pulmonary ground-glass opacity, perhaps with superimposed air trapping at the lung bases resulting in mosaic attenuation. A viral/atypical respiratory infection was  considered but felt unlikely. Electronically Signed   By: Odessa Fleming M.D.   On: 08/24/2020 17:43   DG CHEST PORT 1 VIEW  Result Date: 08/24/2020 CLINICAL DATA:  Shortness of breath. EXAM: PORTABLE CHEST 1 VIEW COMPARISON:  09/24/2008 FINDINGS: The heart is mildly  enlarged despite the AP projection and portable technique. There is central vascular congestion and slight increased interstitial markings which could suggest mild edema. No pleural effusions or focal infiltrates. The bony thorax is intact. IMPRESSION: Cardiac enlargement with vascular congestion and possible mild interstitial edema. No definite infiltrates or effusions. Electronically Signed   By: Rudie Meyer M.D.   On: 08/24/2020 12:48   [place] MFM OB DETAIL +14 WK  Result Date: 08/19/2020 ----------------------------------------------------------------------  OBSTETRICS REPORT                       (Signed Final 08/19/2020 02:45 pm) ---------------------------------------------------------------------- Patient Info  ID #:       [id number]                          D.O.B.:  15-Nov-1980 (39 yrs)  Name:       [name]               Visit Date: 08/19/2020 01:09 pm ---------------------------------------------------------------------- Performed By  Attending:        Noralee Space MD        Ref. Address:     [facility]                                                             OB/GYN &                                                             Infertility                                                             [address]                                                             [place], [place]                                                             [address]  Performed By:     Birdena Crandall        Location:  [facility]                                 [facility]  Referred By:      Lendon Colonel MD ---------------------------------------------------------------------- Orders  #  Description                           Code        Ordered By  1  [place] MFM OB DETAIL +14 WK               76811.01    Noland Fordyce ----------------------------------------------------------------------  #  Order #                     Accession #                Episode #  1  [id number]                   [id number]                 [id number] ---------------------------------------------------------------------- Indications  [redacted] weeks gestation of pregnancy                Z3A.30  Encounter for antenatal screening for          Z36.3  malformations  Pregnancy resulting from assisted              O09.819  reproductive technology  Gestational diabetes in pregnancy, (on         O24.419  insulin)  Obesity complicating pregnancy, third          O99.212  trimester (BMI:40-45)  Asthma                                         O99.89 j71.909  Advanced maternal age primigravida 57+,        O53.512  third trimester  Hypertension - Chronic/Pre-existing (on        O10.019  Labetolol)  NIPS:LOw risk, AFP: Neg ---------------------------------------------------------------------- Fetal Evaluation  Num Of Fetuses:         1  Fetal Heart Rate(bpm):  169  Cardiac Activity:       Observed  Presentation:           Breech  Placenta:  Anterior  P. Cord Insertion:      Not well visualized  Amniotic Fluid  AFI FV:      Within normal limits  AFI Sum(cm)     %Tile       Largest Pocket(cm)  19.1            73          6.7  RUQ(cm)       RLQ(cm)       LUQ(cm)        LLQ(cm)  2.8           6.4           6.7            3.2 ---------------------------------------------------------------------- Biometry  BPD:      75.6  mm     G. Age:  30w 2d         35  %    CI:        73.35   %    70 - 86                                                           FL/HC:      19.3   %    19.2 - 21.4  HC:      280.5  mm     G. Age:  30w 5d         22  %    HC/AC:      1.01        0.99 - 1.21  AC:      277.8  mm     G. Age:  31w 6d         84  %    FL/BPD:     71.4   %    71 - 87  FL:         54  mm     G. Age:  28w 4d        3.7  %    FL/AC:      19.4   %    20 - 24  Est. FW:    1606  gm      3 lb 9 oz     43  % ---------------------------------------------------------------------- OB History  Gravidity:    1 ---------------------------------------------------------------------- Gestational Age  LMP:           30w 3d        Date:  01/19/20                 EDD:   10/25/20  U/S Today:     30w 3d                                        EDD:   10/25/20  Best:          30w 3d     Det. By:  LMP  (01/19/20)          EDD:   10/25/20 ---------------------------------------------------------------------- Anatomy  Cranium:               Appears normal  Aortic Arch:            Appears normal  Cavum:                 Appears normal         Ductal Arch:            Not well visualized  Ventricles:            Appears normal         Diaphragm:              Appears normal  Choroid Plexus:        Appears normal         Stomach:                Appears normal, left                                                                        sided  Cerebellum:            Appears normal         Abdomen:                Appears normal  Posterior Fossa:       Appears normal         Abdominal Wall:         Appears nml (cord                                                                        insert, abd wall)  Nuchal Fold:           Not applicable (>20    Cord Vessels:           Appears normal ([redacted]                         wks GA)                                        vessel cord)  Face:                  Not well visualized    Kidneys:                Appear normal  Lips:                  Not well visualized    Bladder:                Appears normal  Thoracic:              Appears normal         Spine:                   Ltd views no  intracranial signs of                                                                        NTD  Heart:                 Appears normal         Upper Extremities:      Visualized                         (4CH, axis, and                         situs)  RVOT:                  Not well visualized    Lower Extremities:      Visualized  LVOT:                  Not well visualized  Other:  Technicallly difficult due to advanced GA and maternal habitus. ---------------------------------------------------------------------- Impression  Patient is here for second opinion ultrasound.  She had fetal  echocardiography that was reported as normal.  However,  aortic arch could not be visualized.  Patient has gestational diabetes and takes insulin.  She also  has chronic hypertension and takes labetalol.  Blood  pressures today at her office were 149/101 and 150/86  mmHg.  On ultrasound, amniotic fluid is normal good fetal activity  seen.  Fetal biometry is consistent with the previously  established dates.  Fetal anatomical survey appears normal  but extremely limited because of advanced gestational age  and maternal obesity.  Aortic arch appears normal.  Maternal obesity imposes limitations on the resolution of  images, and failure to detect fetal anomalies is more common  in obese pregnant women. As maternal obesity makes  clinical assessment of fetal growth difficult, we recommend  serial growth scans until delivery.  I discussed weekly antenatal testing because of gestational  diabetes and hypertension. ---------------------------------------------------------------------- Recommendations  -Weekly BPP till delivery that may be performed at your office. ----------------------------------------------------------------------                  Noralee Space, MD Electronically Signed Final Report   08/19/2020 02:45 pm  ----------------------------------------------------------------------  ECHOCARDIOGRAM COMPLETE  Result Date: 08/24/2020    ECHOCARDIOGRAM REPORT   Patient Name:   [name] Date of Exam: 08/24/2020 Medical Rec #:  [id number]         Height:       68.0 in Accession #:    [id number]        Weight:       293.6 lb Date of Birth:  Jul 02, 1981         BSA:          2.406 m Patient Age:    39 years          BP:           133/97 mmHg Patient Gender: F                 HR:           108 bpm. Exam Location:  Inpatient Procedure: 2D Echo, Cardiac Doppler, Color Doppler and Intracardiac            Opacification Agent Indications:    CHF-Acute diastolic  History:        Patient has prior history of Echocardiogram examinations, most                 recent 07/22/2009. CHF, Signs/Symptoms:Shortness of Breath; Risk                 Factors:Sleep Apnea.  Sonographer:    Ross Ludwig RDCS (AE) Referring Phys: [id number] [facility]  Sonographer Comments: Patient is morbidly obese. IMPRESSIONS  1. There is no left ventricular thrombus with Definity contrast. Left ventricular ejection fraction, by estimation, is 15-20%. The left ventricle has severely decreased function. The left ventricle demonstrates global hypokinesis. The left ventricular internal cavity size was moderately dilated. Left ventricular diastolic parameters are consistent with Grade III diastolic dysfunction (restrictive). Elevated left atrial pressure.  2. Right ventricular systolic function is moderately reduced. The right ventricular size is normal. There is moderately elevated pulmonary artery systolic pressure. The estimated right ventricular systolic pressure is 45.0 mmHg.  3. Left atrial size was severely dilated.  4. Right atrial size was moderately dilated.  5. The pericardial effusion is circumferential.  6. The mitral valve is normal in structure. Mild to moderate mitral valve regurgitation.  7. The aortic valve is normal in structure. Aortic valve  regurgitation is not visualized. No aortic stenosis is present.  8. The inferior vena cava is dilated in size with <50% respiratory variability, suggesting right atrial pressure of 15 mmHg. FINDINGS  Left Ventricle: There is no left ventricular thrombus with Definity contrast. Left ventricular ejection fraction, by estimation, is 15-20%. The left ventricle has severely decreased function. The left ventricle demonstrates global hypokinesis. Definity contrast agent was given IV to delineate the left ventricular endocardial borders. The left ventricular internal cavity size was moderately dilated. There is no left ventricular hypertrophy. Left ventricular diastolic parameters are consistent with Grade  III diastolic dysfunction (restrictive). Elevated left atrial pressure. Right Ventricle: The right ventricular size is normal. No increase in right ventricular wall thickness. Right ventricular systolic function is moderately reduced. There is moderately elevated pulmonary artery systolic pressure. The tricuspid regurgitant velocity is 2.74 m/s, and with an assumed right atrial pressure of 15 mmHg, the estimated right ventricular systolic pressure is 45.0 mmHg. Left Atrium: Left atrial size was severely dilated. Right Atrium: Right atrial size was moderately dilated. Pericardium: Trivial pericardial effusion is present. The pericardial effusion is circumferential. Mitral Valve: The mitral valve is normal in structure. Mild to moderate mitral valve regurgitation, with centrally-directed jet. Tricuspid Valve: The tricuspid valve is normal in structure. Tricuspid valve regurgitation is mild. Aortic Valve: The aortic valve is normal in structure. Aortic valve regurgitation is not visualized. No aortic stenosis is present. Aortic valve mean gradient measures 2.0 mmHg. Aortic valve peak gradient measures 2.8 mmHg. Aortic valve area, by VTI measures 1.46 cm. Pulmonic Valve: The pulmonic valve was grossly normal. Pulmonic valve  regurgitation is trivial. Aorta: The aortic root and ascending aorta are structurally normal, with no evidence of dilitation. Venous: The inferior vena cava is dilated in size with less than 50% respiratory variability, suggesting right atrial pressure of 15 mmHg. IAS/Shunts: No atrial level shunt detected by color flow Doppler.  LEFT VENTRICLE PLAX 2D LVIDd:         6.18 cm LVIDs:  5.56 cm LV PW:         1.10 cm LV IVS:        1.10 cm LVOT diam:     1.80 cm LV SV:         18 LV SV Index:   7 LVOT Area:     2.54 cm  RIGHT VENTRICLE            IVC RV Basal diam:  3.40 cm    IVC diam: 2.70 cm RV S prime:     6.46 cm/s TAPSE (M-mode): 1.1 cm LEFT ATRIUM             Index       RIGHT ATRIUM           Index LA diam:        4.50 cm 1.87 cm/m  RA Area:     23.90 cm LA Vol (A2C):   91.6 ml 38.07 ml/m RA Volume:   72.40 ml  30.09 ml/m LA Vol (A4C):   97.6 ml 40.57 ml/m LA Biplane Vol: 99.4 ml 41.31 ml/m  AORTIC VALVE AV Area (Vmax):    1.53 cm AV Area (Vmean):   1.43 cm AV Area (VTI):     1.46 cm AV Vmax:           84.30 cm/s AV Vmean:          61.300 cm/s AV VTI:            0.122 m AV Peak Grad:      2.8 mmHg AV Mean Grad:      2.0 mmHg LVOT Vmax:         50.80 cm/s LVOT Vmean:        34.400 cm/s LVOT VTI:          0.070 m LVOT/AV VTI ratio: 0.57  AORTA Ao Root diam: 2.50 cm Ao Asc diam:  2.80 cm MR Peak grad:    72.6 mmHg   TRICUSPID VALVE MR Mean grad:    45.0 mmHg   TR Peak grad:   30.0 mmHg MR Vmax:         426.00 cm/s TR Vmax:        274.00 cm/s MR Vmean:        311.0 cm/s MR PISA:         1.01 cm    SHUNTS MR PISA Eff ROA: 9 mm       Systemic VTI:  0.07 m MR PISA Radius:  0.40 cm     Systemic Diam: 1.80 cm Rachelle Hora Croitoru MD Electronically signed by Thurmon Fair MD Signature Date/Time: 08/24/2020/6:09:06 PM    Final    VAS [place] LOWER EXTREMITY VENOUS (DVT)  Result Date: 08/25/2020  Lower Venous DVT Study Indications: Swelling, and Edema.  Limitations: Poor ultrasound/tissue interface and body  habitus. Comparison Study: no prior Performing Technologist: Blanch Media RVS  Examination Guidelines: A complete evaluation includes B-mode imaging, spectral Doppler, color Doppler, and power Doppler as needed of all accessible portions of each vessel. Bilateral testing is considered an integral part of a complete examination. Limited examinations for reoccurring indications may be performed as noted. The reflux portion of the exam is performed with the patient in reverse Trendelenburg.  +---------+---------------+---------+-----------+----------+-------------------+ RIGHT    CompressibilityPhasicitySpontaneityPropertiesThrombus Aging      +---------+---------------+---------+-----------+----------+-------------------+ CFV      Full           Yes      Yes                                      +---------+---------------+---------+-----------+----------+-------------------+  SFJ      Full                                                             +---------+---------------+---------+-----------+----------+-------------------+ FV Prox  Full                                                             +---------+---------------+---------+-----------+----------+-------------------+ FV Mid                  Yes      Yes                                      +---------+---------------+---------+-----------+----------+-------------------+ FV Distal               Yes      Yes                                      +---------+---------------+---------+-----------+----------+-------------------+ PFV      Full                                                             +---------+---------------+---------+-----------+----------+-------------------+ POP      Full           Yes      Yes                                      +---------+---------------+---------+-----------+----------+-------------------+ PTV      Full                                                              +---------+---------------+---------+-----------+----------+-------------------+ PERO                                                  Not well visualized +---------+---------------+---------+-----------+----------+-------------------+   +---------+---------------+---------+-----------+----------+--------------+ LEFT     CompressibilityPhasicitySpontaneityPropertiesThrombus Aging +---------+---------------+---------+-----------+----------+--------------+ CFV      Full           Yes      Yes                                 +---------+---------------+---------+-----------+----------+--------------+ SFJ      Full                                                        +---------+---------------+---------+-----------+----------+--------------+  FV Prox  Full                                                        +---------+---------------+---------+-----------+----------+--------------+ FV Mid                  Yes      Yes                                 +---------+---------------+---------+-----------+----------+--------------+ FV Distal               Yes      Yes                                 +---------+---------------+---------+-----------+----------+--------------+ PFV      Full                                                        +---------+---------------+---------+-----------+----------+--------------+ POP      Full           Yes      Yes                                 +---------+---------------+---------+-----------+----------+--------------+ PTV      Full                                                        +---------+---------------+---------+-----------+----------+--------------+ PERO     Full                                                        +---------+---------------+---------+-----------+----------+--------------+     Summary: RIGHT: - There is no evidence of deep vein thrombosis in the lower extremity. However,  portions of this examination were limited- see technologist comments above.  - No cystic structure found in the popliteal fossa.  LEFT: - There is no evidence of deep vein thrombosis in the lower extremity. However, portions of this examination were limited- see technologist comments above.  - No cystic structure found in the popliteal fossa.  *See table(s) above for measurements and observations. Electronically signed by Lemar Livings MD on 08/25/2020 at 3:13:22 PM.    Final    [place] EKG SITE RITE  Result Date: 08/27/2020 If Site Rite image not attached, placement could not be confirmed due to current cardiac rhythm.  [place] MFM OB LIMITED  Result Date: 08/25/2020 ----------------------------------------------------------------------  OBSTETRICS REPORT                       (Signed Final 08/25/2020 01:20 pm) ---------------------------------------------------------------------- Patient Info  ID #:       [id number]  D.O.B.:  1981/01/30 (39 yrs)  Name:       [name]               Visit Date: 08/25/2020 10:27 am ---------------------------------------------------------------------- Performed By  Attending:        Ma Rings MD         Ref. Address:     [facility]                                                             OB/GYN &                                                             Infertility                                                             [address]                                                             [place], [place]                                                             [address]  Performed By:     Percell Boston          Secondary Phy.:   [facility]                    RD[facility]                                                             Care  Referred By:      Alphonsus Sias                Location:         [facility]  ---------------------------------------------------------------------- Orders  #  Description                           Code        Ordered By  1  [place] MFM OB LIMITED                     [id number]    CASSANDRA LAW ----------------------------------------------------------------------  #  Order #                     Accession #                Episode #  1  [id number]                   [id number]                 [id number] ---------------------------------------------------------------------- Indications  Gestational diabetes in pregnancy, (on         O24.419  insulin)  Hypertension - Chronic/Pre-existing (on        O10.019  Labetolol)  [redacted] weeks gestation of pregnancy                Z3A.31  Pregnancy resulting from assisted              O09.819  reproductive technology  Obesity complicating pregnancy, third          O99.212  trimester (BMI:40-45)  Asthma                                         O99.89 j34.909  Advanced maternal age primigravida 23+,        O61.512  third trimester  NIPS:LOw risk, AFP: Neg ---------------------------------------------------------------------- Fetal Evaluation  Num Of Fetuses:         1  Fetal Heart Rate(bpm):  143  Cardiac Activity:       Observed  Presentation:           Breech  Placenta:               Anterior  Amniotic Fluid  AFI FV:      Within normal limits  AFI Sum(cm)     %Tile       Largest Pocket(cm)  12.9            38          4.3  RUQ(cm)       RLQ(cm)       LUQ(cm)        LLQ(cm)  4.3           2.3           4.1            2.2 ---------------------------------------------------------------------- OB History  Gravidity:    1 ---------------------------------------------------------------------- Gestational Age  LMP:           31w 2d        Date:  01/19/20                 EDD:   10/25/20  Best:          31w 2d     Det. By:  LMP  (01/19/20)          EDD:   10/25/20 ---------------------------------------------------------------------- Anatomy  Thoracic:  Appears normal          Bladder:                Appears normal  Stomach:               Appears normal, left                         sided ---------------------------------------------------------------------- Cervix Uterus Adnexa  Cervix  Not visualized (advanced GA >24wks)  Uterus  No abnormality visualized.  Right Ovary  No adnexal mass visualized.  Left Ovary  No adnexal mass visualized.  Cul De Sac  No free fluid seen.  Adnexa  No abnormality visualized. ---------------------------------------------------------------------- Comments  This patient has been hospitalized due to maternal heart  failure with a systolic ejection fraction of between 15 to 20%.  A limited ultrasound performed today shows normal amniotic  fluid.  The fetus is in the breech presentation today.  Fetal  breathing and fetal body movements were noted throughout  today's ultrasound exam.  The patient should continue to be treated with the appropriate  medications for the treatment of heart failure. ----------------------------------------------------------------------                   Ma Rings, MD Electronically Signed Final Report   08/25/2020 01:20 pm ----------------------------------------------------------------------   MAU Course  Procedures  MDM -s/p emergency C/S with wound vac -wound vac and incision appear intact without s/sx of infection -tegederm reinforced in lower right corner, no fluid noted -pt discharged to home in stable condition  Orders Placed This Encounter  Procedures  . Discharge patient    Order Specific Question:   Discharge disposition    Answer:   01-Home or Self Care [1]    Order Specific Question:   Discharge patient date    Answer:   09/04/2020   No orders of the defined types were placed in this encounter.  Assessment and Plan   1. Encounter for post surgical wound check   2. Encounter for management of wound VAC   3. Postpartum state    Allergies as of 09/04/2020      Reactions   Clomiphene  Hives   Hives and lips swelling.      Medication List    TAKE these medications   albuterol 108 (90 Base) MCG/ACT inhaler Commonly known as: ProAir HFA Inhale 2 puffs into the lungs every 6 (six) hours as needed.   albuterol 108 (90 Base) MCG/ACT inhaler Commonly known as: VENTOLIN HFA Inhale 2 puffs into the lungs every 6 (six) hours as needed for wheezing or shortness of breath.   ALPRAZolam 0.25 MG tablet Commonly known as: XANAX Take 1 tablet (0.25 mg total) by mouth 3 (three) times daily as needed for sleep.   aspirin 81 MG chewable tablet Chew 81 mg by mouth daily.   calcium carbonate 500 MG chewable tablet Commonly known as: TUMS - dosed in mg elemental calcium Chew 1-2 tablets by mouth daily as needed for indigestion or heartburn.   carvedilol 3.125 MG tablet Commonly known as: COREG Take 1 tablet (3.125 mg total) by mouth 2 (two) times daily with a meal.   digoxin 0.125 MG tablet Commonly known as: LANOXIN Take 1 tablet (0.125 mg total) by mouth daily.   diphenhydramine-acetaminophen 25-500 MG Tabs tablet Commonly known as: TYLENOL PM Take 1 tablet by mouth at bedtime as needed (sleep).   ergocalciferol 1.25 MG (50000 UT) capsule Commonly known  as: VITAMIN D2 Take 1,250 mcg by mouth daily.   FLUoxetine 20 MG capsule Commonly known as: PROZAC Take 1 capsule (20 mg total) by mouth daily.   furosemide 20 MG tablet Commonly known as: Lasix Take 1 tablet (20 mg total) by mouth daily.   hydrALAZINE 50 MG tablet Commonly known as: APRESOLINE Take 1 tablet (50 mg total) by mouth every 8 (eight) hours.   isosorbide dinitrate 10 MG tablet Commonly known as: ISORDIL Take 1 tablet (10 mg total) by mouth 3 (three) times daily.   Lantus SoloStar 100 UNIT/ML Solostar Pen Generic drug: insulin glargine Inject 32 Units into the skin daily.   metFORMIN 500 MG 24 hr tablet Commonly known as: GLUCOPHAGE-XR TAKE 1 TABLET (500 MG TOTAL) BY MOUTH DAILY WITH  BREAKFAST.   prenatal multivitamin Tabs tablet Take 1 tablet by mouth daily at 12 noon.   Pulmicort Flexhaler 180 MCG/ACT inhaler Generic drug: budesonide Inhale 2 puffs into the lungs every 12 (twelve) hours.   sacubitril-valsartan 24-26 MG Commonly known as: ENTRESTO Take 1 tablet by mouth 2 (two) times daily.   senna-docusate 8.6-50 MG tablet Commonly known as: Senokot-S Take 2 tablets by mouth 2 (two) times daily.   spironolactone 25 MG tablet Commonly known as: ALDACTONE Take 0.5 tablets (12.5 mg total) by mouth daily.   traMADol 50 MG tablet Commonly known as: ULTRAM Take by mouth.   valACYclovir 1000 MG tablet Commonly known as: VALTREX Take 1 tablet (1,000 mg total) by mouth daily.   witch hazel-glycerin pad Commonly known as: TUCKS Apply 1 application topically as needed for hemorrhoids.      -pt to keep appt with OB 09/06/2020 as planned for reevaluation of wound vac -discussed hose can become blocked with too much pressure from certain positioning and to adjust positioning if she felt as though suction was impaired -return MAU precautions given -pt discharged to home in stable condition  Joni Reining E Maniah Nading 09/04/2020, 5:56 AM

## 2020-09-06 DIAGNOSIS — T888XXD Other specified complications of surgical and medical care, not elsewhere classified, subsequent encounter: Secondary | ICD-10-CM | POA: Diagnosis not present

## 2020-09-06 DIAGNOSIS — R238 Other skin changes: Secondary | ICD-10-CM | POA: Diagnosis not present

## 2020-09-08 NOTE — Progress Notes (Addendum)
Advanced Heart Failure Clinic Note   PCP: Donato Schultz, DO PCP-Cardiologist: Chilton Si, MD  HF Cardiologist: Dr. Gala Romney, MD  HPI: [name] is a 39 year old African American female with a history of cardiomegaly, DM, HTN, asthma, OSA, and obesity. IVF pregnancy complicated by HTN and DM. She has taken weight medications. Back in 2010 she saw Dr Eden Emms for HTN and cardiomegaly. Echo in 2010 with normal EF 55-60%. She has not been seen since that time.  Over the past couple of months she developed shortness of breath and lower extremity edema. Systolic blood pressures at home have been in the 130s with an isolated reading > 150.  + orthopnea .  Admitted [redacted] weeks pregnant, with first baby, with hypertension and lower extremity edema and found to have acute systolic heart failure. Echo completed and showed biventricular dysfunction with EF 15-20%. Lower extremity doppler negative for DVT. CTA negative for PE. Cardiology consulted. Started on IV lasix and HF meds compatible with pregnancy. Due to severely reduced EF, HF team consulted. Due to sluggish diuresis and worsening shortness of breath, PICC line was place and showed severely reduced CO-OX which was consistent with cardiogenic shock. Transferred 2H for shock management and initiation of milrinone.  Due to maternal/fetal risk, cesarean section was performed on 08/28/2020 with delivery of premature female. Incisional VAC placed. Post op course included diuresis, weaning inotropes, and initiation GDMT for HF. Discharge weight 277 lbs.   Here today for follow up. SOB has completely resolved. No swelling, CP, dizziness, weights steadily decreasing,  ~247 lbs at home. She is down 50 lbs from admission. No orthopnea/PND. Has not been wearing CPAP. Baby [name] doing well in NICU, should be home in 4-6 weeks. Taking all medications as directed. No fever or chills.   Echo 08/24/20: Biventricular HF EF < 20%  Echo 2010: EF 55-60%    FH: Dad has A FIb, Aunt heart failure.  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    Past Medical History:  Diagnosis Date  . Anemia   . Asthma   . Genital herpes   . Gestational diabetes   . Obesity   . Pre-diabetes   . Thyroid disease     Current Outpatient Medications  Medication Sig Dispense Refill  . albuterol (PROAIR HFA) 108 (90 Base) MCG/ACT inhaler Inhale 2 puffs into the lungs every 6 (six) hours as needed. 6.7 g 1  . albuterol (VENTOLIN HFA) 108 (90 Base) MCG/ACT inhaler Inhale 2 puffs into the lungs every 6 (six) hours as needed for wheezing or shortness of breath. 8 g 6  . aspirin 81 MG chewable tablet Chew 81 mg by mouth daily.    . budesonide (PULMICORT FLEXHALER) 180 MCG/ACT inhaler Inhale 2 puffs into the lungs every 12 (twelve) hours. 1 each 3  . calcium carbonate (TUMS - DOSED IN MG ELEMENTAL CALCIUM) 500 MG chewable tablet Chew 1-2 tablets by mouth daily as needed for indigestion or heartburn.    . carvedilol (COREG) 3.125 MG tablet Take 1 tablet (3.125 mg total) by mouth 2 (two) times daily with a meal. 60 tablet 6  . digoxin (LANOXIN) 0.125 MG tablet Take 1 tablet (0.125 mg total) by mouth daily. 30 tablet 6  . diphenhydramine-acetaminophen (TYLENOL PM) 25-500 MG TABS tablet Take 1 tablet by mouth at bedtime as needed (sleep).    . ergocalciferol (VITAMIN D2) 1.25 MG (50000 UT) capsule Take 1,250 mcg by mouth daily.    [name]  FLUoxetine (PROZAC) 20 MG capsule Take 1 capsule (20 mg total) by mouth daily. 30 capsule 3  . furosemide (LASIX) 20 MG tablet Take 1 tablet (20 mg total) by mouth daily. 30 tablet 6  . hydrALAZINE (APRESOLINE) 50 MG tablet Take 1 tablet (50 mg total) by mouth every 8 (eight) hours. 90 tablet 6  . isosorbide dinitrate (ISORDIL) 10 MG tablet Take 1 tablet (10 mg total) by mouth 3 (three) times daily. 90 tablet 6  . LANTUS SOLOSTAR 100 UNIT/ML Solostar Pen Inject 32 Units into the skin daily.     . Prenatal Vit-Fe  Fumarate-FA (PRENATAL MULTIVITAMIN) TABS tablet Take 1 tablet by mouth daily at 12 noon.    . sacubitril-valsartan (ENTRESTO) 24-26 MG Take 1 tablet by mouth 2 (two) times daily. 60 tablet 6  . senna-docusate (SENOKOT-S) 8.6-50 MG tablet Take 2 tablets by mouth 2 (two) times daily. 60 tablet 6  . spironolactone (ALDACTONE) 25 MG tablet Take 0.5 tablets (12.5 mg total) by mouth daily. 30 tablet 6  . valACYclovir (VALTREX) 1000 MG tablet Take 1 tablet (1,000 mg total) by mouth daily. 90 tablet 3  . witch hazel-glycerin (TUCKS) pad Apply 1 application topically as needed for hemorrhoids. 40 each 12  . ivabradine (CORLANOR) 5 MG TABS tablet Take 0.5 tablets (2.5 mg total) by mouth 2 (two) times daily with a meal. 30 tablet 3   No current facility-administered medications for this encounter.    Allergies  Allergen Reactions  . Clomiphene Hives    Hives and lips swelling.     Social History   Socioeconomic History  . Marital status: Single    Spouse name: Not on file  . Number of children: Not on file  . Years of education: Not on file  . Highest education level: Not on file  Occupational History  . 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 Smoking status: Never Smoker  . Smokeless tobacco: Never Used  Vaping Use  . Vaping Use: Never used  Substance and Sexual Activity  . Alcohol use: Yes    Comment: SOCIAL   . Drug use: No  . Sexual activity: Yes    Partners: Male    Birth control/protection: None    Comment: 1st intercourse- 51, partners- 10, current partner- 4 yrs   Other Topics Concern  . Not on file  Social History Narrative  . Not on file   Social Determinants of Health   Financial Resource Strain: Not on file  Food Insecurity: No Food Insecurity  . Worried About Programme researcher, broadcasting/film/video in the Last Year: Never true  . Ran Out of Food in the Last Year: Never true  Transportation Needs: Not on file  Physical Activity: Not on file  Stress: Not  on file  Social Connections: Not on file  Intimate Partner Violence: Not on file     Family History  Problem Relation Age of Onset  . Hyperlipidemia Mother   . Hypertension Mother   . Depression Father   . Hyperlipidemia Father   . Heart disease Father 45       MI  . Hypertension Father   . Cancer Father 58       prostate  . Cancer Paternal Grandfather        prostate  . Coronary artery disease Other   . Prostate cancer Other   . Cancer Maternal Grandfather        prostate  Vitals:   09/09/20 1110 09/09/20 1145  BP:  98/72  Pulse: (!) 106   SpO2: 96%   Weight: 114 kg (251 lb 6.4 oz)     Wt Readings from Last 3 Encounters:  09/09/20 114 kg (251 lb 6.4 oz)  09/04/20 119.7 kg (264 lb)  09/01/20 126.2 kg (278 lb 3.5 oz)    PHYSICAL EXAM: General:  NAD. No resp difficulty HEENT: Normal Neck: Supple. No JVD. Carotids 2+ bilat; no bruits. No lymphadenopathy or thryomegaly appreciated. Cor: PMI nondisplaced. Tachy rate & rhythm. No rubs, gallops or murmurs. Lungs: Clear Abdomen: Obese, soft, nontender, nondistended. No hepatosplenomegaly. No bruits or masses. Good bowel sounds. Wound vac removed 12/18. Extremities: No cyanosis, clubbing, rash, edema. Neuro: alert & oriented x 3, cranial nerves grossly intact. Moves all 4 extremities w/o difficulty. Affect pleasant.  ECG: ST 104 bpm (personally reviewed) ReDs: 39%  ASSESSMENT & PLAN: 1. Chronic Biventricular Heart Failure - Echo 11/21 severely reduced EF 15-20% . Possible HTN CM. - Developed shock physiology on 12/10. MV sat 40%. Moved to ICU. - C-section 12/11 without complications. Had incisional VAC.  - NYHA II. Volume status stable, ReDs 39%. Continue daily lasix for now. - Start ivabradine 2.5 mg bid. HR 109 today. (personally discussed with Dr. Gala Romney). - Continue carvedilol 3.125 mg bid. No room for titration today. - Continue hydralazine/nitrates.  - Continue Entresto 24/26 bid.  - Continue dig 0.125 mg  daily.  - Continue spiro 12.5 mg daily. - Consider adding Farxiga at next visit. - Discussed reliable birth control & avoidance of future pregnancy. - BMET, BNP, CBC, & digoxin level today  2. Sinus tachycardia - BP too soft to increase beta blocker. - start ivabradine 2.5 mg bid.  3. HTN - Low, stable today. - Continue current regimen.  4. History of AKI - due to ATN, cardiorenal - Labs today.  5. Hypokalemia/hypomag - BMET today.  6. DM2 - a1c 5.4 (12/21) - Continue metformin + Lantus - Per PCP. - add Farxiga at next visit  7. OSA - Recently diagnosed.  - Encouraged nightly CPAP use.  Follow back with PharmD in 2-3 weeks for further medication titration, and back with Dr. Gala Romney in 2-3 months with Echo.  Jacklynn Ganong, FNP 09/09/20   Patient seen and examined with the above-signed Advanced Practice Provider and/or Housestaff. I personally reviewed laboratory data, imaging studies and relevant notes. I independently examined the patient and formulated the important aspects of the plan. I have edited the note to reflect any of my changes or salient points. I have personally discussed the plan with the patient and/or family.  She returns for post-hospital visit for cardiogenic shock and emergent c-section. Feeling much better. Weight down 5o pounds total. Diuresed well. NYHA II. BP soft  General:  Well appearing. No resp difficulty HEENT: normal Neck: supple. no JVD. Carotids 2+ bilat; no bruits. No lymphadenopathy or thryomegaly appreciated. Cor: PMI nondisplaced. Regular. Mildly tachy. No rubs, gallops or murmurs. Lungs: clear Abdomen: soft, nontender, nondistended. No hepatosplenomegaly. No bruits or masses. Good bowel sounds. Extremities: no cyanosis, clubbing, rash, edema Neuro: alert & orientedx3, cranial nerves grossly intact. moves all 4 extremities w/o difficulty. Affect pleasant  She is much improved but still a bit tachycardic with low BP. Will  start low-dose ivabradine. Check labs today. Continue close f/u.   Arvilla Meres, MD  6:08 PM

## 2020-09-09 ENCOUNTER — Other Ambulatory Visit: Payer: Self-pay

## 2020-09-09 ENCOUNTER — Telehealth (HOSPITAL_COMMUNITY): Payer: Self-pay | Admitting: Pharmacy Technician

## 2020-09-09 ENCOUNTER — Encounter (HOSPITAL_COMMUNITY): Payer: Self-pay

## 2020-09-09 ENCOUNTER — Other Ambulatory Visit (HOSPITAL_COMMUNITY): Payer: Self-pay | Admitting: Internal Medicine

## 2020-09-09 ENCOUNTER — Ambulatory Visit (HOSPITAL_COMMUNITY)
Admit: 2020-09-09 | Discharge: 2020-09-09 | Disposition: A | Discharge status: Home / Self Care | Payer: BC Managed Care – PPO | Source: Ambulatory Visit | Attending: Internal Medicine | Admitting: Internal Medicine

## 2020-09-09 ENCOUNTER — Telehealth (HOSPITAL_COMMUNITY): Payer: Self-pay

## 2020-09-09 VITALS — BP 98/72 | HR 106 | Wt 251.4 lb

## 2020-09-09 DIAGNOSIS — Z79899 Other long term (current) drug therapy: Secondary | ICD-10-CM | POA: Insufficient documentation

## 2020-09-09 DIAGNOSIS — J45909 Unspecified asthma, uncomplicated: Secondary | ICD-10-CM | POA: Insufficient documentation

## 2020-09-09 DIAGNOSIS — I5022 Chronic systolic (congestive) heart failure: Secondary | ICD-10-CM

## 2020-09-09 DIAGNOSIS — Z888 Allergy status to other drugs, medicaments and biological substances status: Secondary | ICD-10-CM | POA: Insufficient documentation

## 2020-09-09 DIAGNOSIS — I5082 Biventricular heart failure: Secondary | ICD-10-CM | POA: Insufficient documentation

## 2020-09-09 DIAGNOSIS — E119 Type 2 diabetes mellitus without complications: Secondary | ICD-10-CM | POA: Insufficient documentation

## 2020-09-09 DIAGNOSIS — E876 Hypokalemia: Secondary | ICD-10-CM | POA: Diagnosis not present

## 2020-09-09 DIAGNOSIS — Z794 Long term (current) use of insulin: Secondary | ICD-10-CM | POA: Diagnosis not present

## 2020-09-09 DIAGNOSIS — Z7982 Long term (current) use of aspirin: Secondary | ICD-10-CM | POA: Insufficient documentation

## 2020-09-09 DIAGNOSIS — G4733 Obstructive sleep apnea (adult) (pediatric): Secondary | ICD-10-CM | POA: Insufficient documentation

## 2020-09-09 DIAGNOSIS — I110 Hypertensive heart disease with heart failure: Secondary | ICD-10-CM | POA: Insufficient documentation

## 2020-09-09 DIAGNOSIS — N170 Acute kidney failure with tubular necrosis: Secondary | ICD-10-CM | POA: Insufficient documentation

## 2020-09-09 DIAGNOSIS — R000 Tachycardia, unspecified: Secondary | ICD-10-CM | POA: Insufficient documentation

## 2020-09-09 DIAGNOSIS — I10 Essential (primary) hypertension: Secondary | ICD-10-CM | POA: Diagnosis not present

## 2020-09-09 DIAGNOSIS — Z8249 Family history of ischemic heart disease and other diseases of the circulatory system: Secondary | ICD-10-CM | POA: Diagnosis not present

## 2020-09-09 LAB — CBC
HCT: 37.5 % (ref 36.0–46.0)
Hemoglobin: 11.8 g/dL — ABNORMAL LOW (ref 12.0–15.0)
MCH: 23.8 pg — ABNORMAL LOW (ref 26.0–34.0)
MCHC: 31.5 g/dL (ref 30.0–36.0)
MCV: 75.6 fL — ABNORMAL LOW (ref 80.0–100.0)
Platelets: 369 10*3/uL (ref 150–400)
RBC: 4.96 MIL/uL (ref 3.87–5.11)
RDW: 16.4 % — ABNORMAL HIGH (ref 11.5–15.5)
WBC: 12.1 10*3/uL — ABNORMAL HIGH (ref 4.0–10.5)
nRBC: 0 % (ref 0.0–0.2)

## 2020-09-09 LAB — DIGOXIN LEVEL: Digoxin Level: 0.8 ng/mL (ref 0.8–2.0)

## 2020-09-09 LAB — BASIC METABOLIC PANEL
Anion gap: 11 (ref 5–15)
BUN: 6 mg/dL (ref 6–20)
CO2: 23 mmol/L (ref 22–32)
Calcium: 9 mg/dL (ref 8.9–10.3)
Chloride: 106 mmol/L (ref 98–111)
Creatinine, Ser: 0.95 mg/dL (ref 0.44–1.00)
GFR, Estimated: >60 mL/min (ref >60)
Glucose, Bld: 101 mg/dL — ABNORMAL HIGH (ref 70–99)
Potassium: 3.4 mmol/L — ABNORMAL LOW (ref 3.5–5.1)
Sodium: 140 mmol/L (ref 135–145)

## 2020-09-09 LAB — BRAIN NATRIURETIC PEPTIDE: B Natriuretic Peptide: 114.4 pg/mL — ABNORMAL HIGH (ref 0.0–100.0)

## 2020-09-09 MED ORDER — POTASSIUM CHLORIDE CRYS ER 20 MEQ PO TBCR: 20.0000 meq | EXTENDED_RELEASE_TABLET | Freq: Every day | ORAL | 3 refills | Status: DC

## 2020-09-09 MED ORDER — IVABRADINE HCL 5 MG PO TABS: 2.5000 mg | ORAL_TABLET | Freq: Two times a day (BID) | ORAL | 3 refills | Status: DC

## 2020-09-09 MED FILL — POTASSIUM CHLORIDE CRYS ER: 20 | 90 days supply | Qty: 90 | Fill #0

## 2020-09-09 NOTE — Telephone Encounter (Signed)
-----   Message from Jacklynn Ganong, [place] sent at 09/09/2020  2:16 PM EST ----- Potassium is low. Please take 60 mEq KCl x 1 dose and then 20 mEq KCl daily. She will need a repeat bmet in 7-10 days.

## 2020-09-09 NOTE — Telephone Encounter (Signed)
Samara Snide, RN  09/09/2020 2:57 PM EST Back to Top     Patient advised and verbalized understanding. Med list updated. Lab appointment scheduled, lab orders placed.

## 2020-09-09 NOTE — Addendum Note (Signed)
Encounter addended by: Dolores Patty, MD on: 09/09/2020 6:10 PM  Actions taken: Level of Service modified, Visit diagnoses modified, Clinical Note Signed

## 2020-09-09 NOTE — Telephone Encounter (Signed)
Patient Advocate Encounter   Received notification from [facility] that prior authorization for Corlanor is required.   PA submitted on CoverMyMeds Key [id number] Status is pending   Will continue to follow.

## 2020-09-09 NOTE — Telephone Encounter (Signed)
Advanced Heart Failure Patient Advocate Encounter  Prior Authorization for Corlanor has been approved.    Effective dates: 09/09/20 through 09/09/21  Patients co-pay is $0  Archer Asa, CPhT

## 2020-09-09 NOTE — Patient Instructions (Signed)
Start Ivabradine (Corlanor) 2.5 mg (1/2 tab) Twice daily   Labs done today, your results will be available in MyChart, we will contact you for abnormal readings.  Please follow up with our heart failure pharmacist in 2-3 weeks  Your physician recommends that you schedule a follow-up appointment in: 2-3 months with echocardiogram  If you have any questions or concerns before your next appointment please send [place] a message through [facility] or call our office at [phone].    TO LEAVE A MESSAGE FOR THE NURSE SELECT OPTION 2, PLEASE LEAVE A MESSAGE INCLUDING: . YOUR NAME . DATE OF BIRTH . CALL BACK NUMBER . REASON FOR CALL**this is important as we prioritize the call backs  YOU WILL RECEIVE A CALL BACK THE SAME DAY AS LONG AS YOU CALL BEFORE 4:00 PM  At the [facility], you and your health needs are our priority. As part of our continuing mission to provide you with exceptional heart care, we have created designated Provider Care Teams. These Care Teams include your primary Cardiologist (physician) and Advanced Practice Providers (APPs- Physician Assistants and Nurse Practitioners) who all work together to provide you with the care you need, when you need it.   You may see any of the following providers on your designated Care Team at your next follow up: [name] Dr Arvilla Meres . Dr Marca Ancona . Tonye Becket, NP . Robbie Lis, PA . Karle Plumber, PharmD   Please be sure to bring in all your medications bottles to every appointment.

## 2020-09-09 NOTE — Progress Notes (Signed)
Medication Samples have been provided to the patient.  Drug name: Corlanor       Strength: 5 mg        Qty: 4  LOT: [id number]  Exp.Date: 7/25  Dosing instructions: Take 1/2 tab Twice daily   The patient has been instructed regarding the correct time, dose, and frequency of taking this medication, including desired effects and most common side effects.   Klaire Court 12:16 PM 09/09/2020   Pt also given corlanor copay card

## 2020-09-19 ENCOUNTER — Ambulatory Visit: Payer: Self-pay

## 2020-09-19 NOTE — Lactation Note (Addendum)
This note was copied from a baby's chart. Lactation Consultation Note  Patient Name: Girl Alaila Pillard [id number] Date: 09/19/2020   Family not in room when  [facility] visited.  RN suggested coming back between 2 pm/ evening.     Age:40 wk.o.  Maternal Data    Feeding Feeding Type: Donor Breast Milk  LATCH Score                   Interventions    Lactation Tools Discussed/Used     Consult Status      Maryruth Hancock [facility] 09/19/2020, 11:13 AM

## 2020-09-20 ENCOUNTER — Other Ambulatory Visit: Payer: Self-pay

## 2020-09-20 ENCOUNTER — Ambulatory Visit (HOSPITAL_COMMUNITY)
Admission: RE | Admit: 2020-09-20 | Discharge: 2020-09-20 | Disposition: A | Discharge status: Home / Self Care | Payer: BC Managed Care – PPO | Source: Ambulatory Visit | Attending: Cardiology | Admitting: Cardiology

## 2020-09-20 DIAGNOSIS — I5022 Chronic systolic (congestive) heart failure: Secondary | ICD-10-CM | POA: Diagnosis not present

## 2020-09-20 LAB — BASIC METABOLIC PANEL
Anion gap: 10 (ref 5–15)
BUN: 10 mg/dL (ref 6–20)
CO2: 25 mmol/L (ref 22–32)
Calcium: 9.5 mg/dL (ref 8.9–10.3)
Chloride: 102 mmol/L (ref 98–111)
Creatinine, Ser: 0.99 mg/dL (ref 0.44–1.00)
GFR, Estimated: >60 mL/min (ref >60)
Glucose, Bld: 95 mg/dL (ref 70–99)
Potassium: 3.7 mmol/L (ref 3.5–5.1)
Sodium: 137 mmol/L (ref 135–145)

## 2020-09-21 NOTE — Progress Notes (Addendum)
PCP: Donato Schultz, DO PCP-Cardiologist: Chilton Si, MD  HF Cardiologist: Dr. Gala Romney, MD  HPI:  [name] is a 40 year old African American female with a history of cardiomegaly,DM, HTN, asthma,OSA,and obesity.IVF pregnancy complicated by HTN and DM.She has taken weight medications. Back in 2010 she saw Dr Eden Emms for HTN and cardiomegaly. Echo in 2010with normal EF55-60%.She has not been seen since that time.  Over thepast couple of monthsshe developed shortness of breath and lower extremity edema.Systolic blood pressures at home have been in the 130s with an isolated reading >150. + orthopnea .  Admitted [redacted] weeks pregnant, with first baby, with hypertension and lower extremity edema and found to have acute systolic heart failure. Echo completed and showed biventricular dysfunction with EF 15-20%. Lower extremity doppler negative for DVT.CTA negative for PE.Cardiology consulted. Started on IV lasix and HF meds compatible with pregnancy. Due to severely reduced EF, HF team consulted. Due to sluggish diuresis and worsening shortness of breath, PICC line was place and showed severely reduced CO-OX which was consistent with cardiogenic shock. Transferred 2H for shock management and initiation of milrinone. Due to maternal/fetal risk, cesarean section was performed on 08/28/2020 with delivery of premature female. Incisional VAC placed. Post op course included diuresis, weaning inotropes, and initiation GDMT for HF. Discharge weight 277 lbs.   Recently presented to HF Clinic for follow up on 09/09/20. SOB has completely resolved. No swelling, CP, dizziness, weights steadily decreasing,  ~247 lbs at home. She was down 50 lbs from admission. No orthopnea/PND. Had not been wearing CPAP. Baby [name] doing well in NICU, should be home in 4-6 weeks. Taking all medications as directed. No fever or chills.   Today she returns to HF clinic for pharmacist medication titration. At  last visit with APP, ivabradine 2.5 mg BID was initiated. Additionally, potassium chloride 20 mEq daily was initiated for low K. Overall she is not feeling well today. Has noted gaining >5 lbs over the last week. She messaged Dr. Gala Romney, who had her take furosemide 40 mg x3 days. Unfortunately, she has continued to gain weight and has not experienced increased urine output with the increase in furosemide dose. Additionally, she has been complaining of dizziness, which is worse upon standing. Also notes that her heart has been "racing" throughout the day for the last week. When she takes her pulse during these events, the pulse is "normal". The episodes occur multiple times per day and last for approximately 30 minutes. Although weight has been increasing at home, weight in clinic is down 1 lb since last visit. No LEE, PND or orthopnea. Reds 38% in clinic (last visit ReDS 39%).  No SOB/DOE. Appetite has been improving. Patient noted that she self-discontinued her Lantus (was on for gestational diabetes and has now given birth). Encouraged her to follow up with OB or PCP to determine if she needs to restart metformin (previously taking for pre-diabetes and last A1C was 5.4%).     HF Medications: Carvedilol 3.125 mg BID Entresto 24/26 mg BID Spironolactone 12.5 mg daily Hydralazine 50 mg TID Isosorbide dinitrate 10 mg TID Ivabradine 2.5 mg BID Digoxin 0.125 mg daily Furosemide 20 mg daily Potassium chloride 20 mEq daily  Does the patient have any problems obtaining medications due to transportation or finances?   No - has Financial risk analyst. I gave her Sherryll Burger and Corlanor copay cards today.   Understanding of regimen: fair Understanding of indications: good Potential of compliance: good Patient understands to avoid NSAIDs.  Patient understands to avoid decongestants.    Pertinent Lab Values: . 09/20/20: Serum creatinine 0.99, BUN 10, Potassium 3.7, Sodium 137, BNP 114.4  (09/09/21) . 10/05/20: Serum creatinine 0.86, BUN 5, Potassium 3.9, Sodium 137, BNP 61.4  Vital Signs: . Weight: 250.6 lbs (last clinic weight: 251.4 lbs) . Blood pressure: 110/66  . Heart rate: 60   Assessment: 1. Chronic Biventricular Heart Failure - Echo 11/21 severely reduced EF 15-20% . Possible HTN CM. - Developed shock physiology on 12/10. MV sat 40%. Moved to ICU. - C-section 08/28/20 without complications.Had incisional VAC. -NYHA II. Appears euvolemic on exam and ReDS 38% (39% at last clinic visit). However, weight has been increasing at home, with no improvement after increasing furosemide to 40 mg daily for 3 days.  - Labs returned following visit: BNP had improved from 114.4 to 61.4. BMET stable, Scr 0.86, BUN 5, K 3.9. - Continue furosemide 20 mg daily.  - Continue carvedilol 3.125 mg BID.  - Continue ivabradine 2.5 mg BID. - Continue Entresto 24/26 mg BID  - Continue spironolactone 12.5 mg daily - Decrease Hydralazine to 25 mg TID to help with dizziness.  - Continue isosorbide dinitrate 10 mg TID - Continue digoxin0.125 mg daily. - Discussed reliable birth control & avoidance of future pregnancy. - Case discussed with Robbie Lis, PA. Will do 72 hour Zio patch to better assess her symptoms of dizziness and palpitations.   2. Sinus tachycardia - Improved to 60 bpm today - Continue carvedilol 3.125 mg BID - Continue ivabradine 2.5 mg mg BID. - 72 hour Zio patch placed as above.   3. HTN - Low, stable today. - Decrease hydralazine as above  4. History of AKI - due to ATN, cardiorenal - Scr 0.86 on 10/05/20  5. DM2 - a1c 5.4 (12/21) -Self-discontinued Lantus. Encouraged follow up with either PCP or OB.   7. OSA - Recently diagnosed.  - Encouraged nightly CPAP use.

## 2020-09-28 ENCOUNTER — Encounter: Payer: Self-pay | Admitting: Primary Care

## 2020-09-28 ENCOUNTER — Other Ambulatory Visit: Payer: Self-pay

## 2020-09-28 ENCOUNTER — Ambulatory Visit (INDEPENDENT_AMBULATORY_CARE_PROVIDER_SITE_OTHER): Payer: BC Managed Care – PPO | Admitting: Primary Care

## 2020-09-28 VITALS — BP 118/72 | HR 80 | Temp 97.6°F | Ht 68.0 in | Wt 248.2 lb

## 2020-09-28 DIAGNOSIS — G4733 Obstructive sleep apnea (adult) (pediatric): Secondary | ICD-10-CM | POA: Diagnosis not present

## 2020-09-28 DIAGNOSIS — J4520 Mild intermittent asthma, uncomplicated: Secondary | ICD-10-CM

## 2020-09-28 NOTE — Patient Instructions (Signed)
Pleasure seeing you today, congratulations on your new born   Recommendations: - Aim to wear CPAP 4-6 hours or more each night (you need to be 70% compliant with this for insurance coverage) - Continue to work on weight loss efforts - You can stop pulmicort - Continue to use albuterol 2 puffs every 4-6 hours for shortness of breath/wheeizng- if using more than 2-3 times a day and not effective call our office   Orders: - Change CPAP pressure to 5-15cm h20  Follow-up: - 6 week televisit with Beth NP     CPAP and BPAP Information CPAP and BPAP are methods that use air pressure to keep your airways open and to help you breathe well. CPAP and BPAP use different amounts of pressure. Your health care provider will tell you whether CPAP or BPAP would be more helpful for you.  CPAP stands for "continuous positive airway pressure." With CPAP, the amount of pressure stays the same while you breathe in and out.  BPAP stands for "bi-level positive airway pressure." With BPAP, the amount of pressure will be higher when you breathe in (inhale) and lower when you breathe out(exhale). This allows you to take larger breaths. CPAP or BPAP may be used in the hospital, or your health care provider may want you to use it at home. You may need to have a sleep study before your health care provider can order a machine for you to use at home. Why are CPAP and BPAP treatments used? CPAP or BPAP can be helpful if you have:  Sleep apnea.  Chronic obstructive pulmonary disease (COPD).  Heart failure.  Medical conditions that cause muscle weakness, including muscular dystrophy or amyotrophic lateral sclerosis (ALS).  Other problems that cause breathing to be shallow, weak, abnormal, or difficult. CPAP and BPAP are most commonly used for obstructive sleep apnea (OSA) to keep the airways from collapsing when the muscles relax during sleep. How is CPAP or BPAP administered? Both CPAP and BPAP are provided by a  small machine with a flexible plastic tube that attaches to a plastic mask that you wear. Air is blown through the mask into your nose or mouth. The amount of pressure that is used to blow the air can be adjusted on the machine. Your health care provider will set the pressure setting and help you find the best mask for you. When should CPAP or BPAP be used? In most cases, the mask only needs to be worn during sleep. Generally, the mask needs to be worn throughout the night and during any daytime naps. People with certain medical conditions may also need to wear the mask at other times when they are awake. Follow instructions from your health care provider about when to use the machine. What are some tips for using the mask?  Because the mask needs to be snug, some people feel trapped or closed-in (claustrophobic) when first using the mask. If you feel this way, you may need to get used to the mask. One way to do this is to hold the mask loosely over your nose or mouth and then gradually apply the mask more snugly. You can also gradually increase the amount of time that you use the mask.  Masks are available in various types and sizes. If your mask does not fit well, talk with your health care provider about getting a different one. Some common types of masks include: ? Full face masks, which fit over the mouth and nose. ? Nasal masks,  which fit over the nose. ? Nasal pillow or prong masks, which fit into the nostrils.  If you are using a mask that fits over your nose and you tend to breathe through your mouth, a chin strap may be applied to help keep your mouth closed.  Some CPAP and BPAP machines have alarms that may sound if the mask comes off or develops a leak.  If you have trouble with the mask, it is very important that you talk with your health care provider about finding a way to make the mask easier to tolerate. Do not stop using the mask. There could be a negative impact to your health if  you stop using the mask.   What are some tips for using the machine?  Place your CPAP or BPAP machine on a secure table or stand near an electrical outlet.  Know where the on/off switch is on the machine.  Follow instructions from your health care provider about how to set the pressure on your machine and when you should use it.  Do not eat or drink while the CPAP or BPAP machine is on. Food or fluids could get pushed into your lungs by the pressure of the CPAP or BPAP.  For home use, CPAP and BPAP machines can be rented or purchased through home health care companies. Many different brands of machines are available. Renting a machine before purchasing may help you find out which particular machine works well for you. Your insurance may also decide which machine you may get.  Keep the CPAP or BPAP machine and attachments clean. Ask your health care provider for specific instructions. Follow these instructions at home:  Do not use any products that contain nicotine or tobacco, such as cigarettes, e-cigarettes, and chewing tobacco. If you need help quitting, ask your health care provider.  Keep all follow-up visits as told by your health care provider. This is important. Contact a health care provider if:  You have redness or pressure sores on your head, face, mouth, or nose from the mask or head gear.  You have trouble using the CPAP or BPAP machine.  You cannot tolerate wearing the CPAP or BPAP mask.  Someone tells you that you snore even when wearing your CPAP or BPAP. Get help right away if:  You have trouble breathing.  You feel confused. Summary  CPAP and BPAP are methods that use air pressure to keep your airways open and to help you breathe well.  You may need to have a sleep study before your health care provider can order a machine for home use.  If you have trouble with the mask, it is very important that you talk with your health care provider about finding a way to  make the mask easier to tolerate. Do not stop using the mask. There could be a negative impact to your health if you stop using the mask.  Follow instructions from your health care provider about when to use the machine. This information is not intended to replace advice given to you by your health care provider. Make sure you discuss any questions you have with your health care provider. Document Revised: 09/26/2019 Document Reviewed: 09/29/2019 Elsevier Patient Education  2021 [employer].

## 2020-09-28 NOTE — Progress Notes (Signed)
@Patient  ID: , female    DOB: August 22, 1981, 40 y.o.   MRN: [id number]  Chief Complaint  Patient presents with  . Follow-up    Referring provider: [id number], *  HPI: 40 -year-old female, never smoked.  Past medical history significant for obstructive sleep apnea, childhood asthma, hypertension, chronic diastolic heart failure, cardiomegaly, vitamin D deficiency.  Hello 40, seen for initial consult on 07/07/2020 for shortness of breath nocturnal wheezing.  Patient was given prescription for Pulmicort 180 MCG Flexhaler to use twice daily with albuterol inhaler and ordered for home sleep study for possible underlying obstructive sleep apnea.  Advised to follow-up in 6 weeks.  09/28/2020- Interim  Patient presents today for for an overdue follow-up.  Sleep study completed on 08/08/2020 which showed severe obstructive sleep apnea with mild oxygen desaturations.  Recommended CPAP therapy auto titrate 5 to 20 cm H2O. patient received CPAP machine beginning of December 2021.  Patient as admitted from 08/24/2020 - 09/01/2020 with hypertension and lower extremity edema and found to have acute systolic hearty failure. Echo completed and showed biventricular dysfunction with EF 15-20%. She was started on Lasix and HF meds. She had cesarean section 08/28/20 without complications, had incisional VAC. She established with cardiology on 09/09/20 outpatient following hospitalization. Continues hydralazine/nitrates, Entresto 24/26 BID, Digoxin 0.110mcg daily, Spirolactone 12.5mg  daily. Added 32m.   She is doing ok. Has not breathing complaints. Denies wheezing or shortness of breath. Stopped using Pulmicort and has not required SABA. Her baby is in the NICU. She just started wearing CPAP, pressure feeling a little high. She has not problems sleeping at night. No significant improvement in energy level.   Airview download 08/28/20-09/27/19 3/30 days (10%); 3 days (10%) > 4  hours Average usage days used 6 hours 14 mins Pressure 5-20cm h20 (8.3cm h20-95%) Airleak 0.8L/min (95%) AHI 0.7  Allergies  Allergen Reactions  . Clomiphene Hives    Hives and lips swelling.    Immunization History  Administered Date(s) Administered  . Hpv 05/16/2006, 08/06/2006, 03/11/2007  . Influenza Whole 07/02/2008, 07/13/2009  . Influenza,inj,Quad PF,6+ Mos 07/07/2020  . Moderna SARS-COV2 Booster Vaccination 07/16/2020  . Moderna Sars-Covid-2 Vaccination 11/10/2019, 12/09/2019  . Td 02/17/1999, 11/10/2010  . Tdap 08/11/2020    Past Medical History:  Diagnosis Date  . Anemia   . Asthma   . Genital herpes   . Gestational diabetes   . Obesity   . Pre-diabetes   . Thyroid disease     Tobacco History: Social History   Tobacco Use  Smoking Status Never Smoker  Smokeless Tobacco Never Used   Counseling given: Not Answered   Outpatient Medications Prior to Visit  Medication Sig Dispense Refill  . albuterol (PROAIR HFA) 108 (90 Base) MCG/ACT inhaler Inhale 2 puffs into the lungs every 6 (six) hours as needed. 6.7 g 1  . albuterol (VENTOLIN HFA) 108 (90 Base) MCG/ACT inhaler Inhale 2 puffs into the lungs every 6 (six) hours as needed for wheezing or shortness of breath. 8 g 6  . aspirin 81 MG chewable tablet Chew 81 mg by mouth daily.    . budesonide (PULMICORT FLEXHALER) 180 MCG/ACT inhaler Inhale 2 puffs into the lungs every 12 (twelve) hours. 1 each 3  . calcium carbonate (TUMS - DOSED IN MG ELEMENTAL CALCIUM) 500 MG chewable tablet Chew 1-2 tablets by mouth daily as needed for indigestion or heartburn.    . carvedilol (COREG) 3.125 MG tablet Take 1 tablet (3.125 mg total) by  mouth 2 (two) times daily with a meal. 60 tablet 6  . digoxin (LANOXIN) 0.125 MG tablet Take 1 tablet (0.125 mg total) by mouth daily. 30 tablet 6  . diphenhydramine-acetaminophen (TYLENOL PM) 25-500 MG TABS tablet Take 1 tablet by mouth at bedtime as needed (sleep).    . ergocalciferol  (VITAMIN D2) 1.25 MG (50000 UT) capsule Take 1,250 mcg by mouth daily.    [name] FLUoxetine (PROZAC) 20 MG capsule Take 1 capsule (20 mg total) by mouth daily. 30 capsule 3  . furosemide (LASIX) 20 MG tablet Take 1 tablet (20 mg total) by mouth daily. 30 tablet 6  . hydrALAZINE (APRESOLINE) 50 MG tablet Take 1 tablet (50 mg total) by mouth every 8 (eight) hours. 90 tablet 6  . isosorbide dinitrate (ISORDIL) 10 MG tablet Take 1 tablet (10 mg total) by mouth 3 (three) times daily. 90 tablet 6  . ivabradine (CORLANOR) 5 MG TABS tablet Take 0.5 tablets (2.5 mg total) by mouth 2 (two) times daily with a meal. 30 tablet 3  . LANTUS SOLOSTAR 100 UNIT/ML Solostar Pen Inject 32 Units into the skin daily.     . potassium chloride SA (KLOR-CON) 20 MEQ tablet Take 1 tablet (20 mEq total) by mouth daily. 90 tablet 3  . Prenatal Vit-Fe Fumarate-FA (PRENATAL MULTIVITAMIN) TABS tablet Take 1 tablet by mouth daily at 12 noon.    . sacubitril-valsartan (ENTRESTO) 24-26 MG Take 1 tablet by mouth 2 (two) times daily. 60 tablet 6  . senna-docusate (SENOKOT-S) 8.6-50 MG tablet Take 2 tablets by mouth 2 (two) times daily. 60 tablet 6  . spironolactone (ALDACTONE) 25 MG tablet Take 0.5 tablets (12.5 mg total) by mouth daily. 30 tablet 6  . valACYclovir (VALTREX) 1000 MG tablet Take 1 tablet (1,000 mg total) by mouth daily. 90 tablet 3  . witch hazel-glycerin (TUCKS) pad Apply 1 application topically as needed for hemorrhoids. 40 each 12   No facility-administered medications prior to visit.   Review of Systems  Review of Systems  Constitutional: Negative.   Respiratory: Negative for cough, shortness of breath and wheezing.   Cardiovascular: Negative.    Physical Exam  BP 118/72 (BP Location: Left Arm, Cuff Size: Normal)   Pulse 80   Temp 97.6 F (36.4 C) (Oral)   Ht 5\' 8"  (1.727 m)   Wt 248 lb 3.2 oz (112.6 kg)   SpO2 100%   BMI 37.74 kg/m  Physical Exam Constitutional:      Appearance: Normal appearance.   HENT:     Head: Normocephalic and atraumatic.     Mouth/Throat:     Mouth: Mucous membranes are moist.     Pharynx: Oropharynx is clear.  Cardiovascular:     Rate and Rhythm: Normal rate and regular rhythm.  Pulmonary:     Effort: Pulmonary effort is normal.     Breath sounds: No wheezing or rhonchi.     Comments: No edema Skin:    General: Skin is warm and dry.  Neurological:     General: No focal deficit present.     Mental Status: She is alert and oriented to person, place, and time. Mental status is at baseline.  Psychiatric:        Mood and Affect: Mood normal.        Behavior: Behavior normal.        Thought Content: Thought content normal.        Judgment: Judgment normal.     Lab Results:  CBC  Component Value Date/Time   WBC 12.1 (H) 09/09/2020 1211   RBC 4.96 09/09/2020 1211   HGB 11.8 (L) 09/09/2020 1211   HGB 11.8 07/23/2009 1131   HCT 37.5 09/09/2020 1211   HCT 34.7 (L) 07/23/2009 1131   PLT 369 09/09/2020 1211   PLT 321 07/23/2009 1131   MCV 75.6 (L) 09/09/2020 1211   MCV 68 (L) 07/23/2009 1131   MCH 23.8 (L) 09/09/2020 1211   MCHC 31.5 09/09/2020 1211   RDW 16.4 (H) 09/09/2020 1211   RDW 15.5 (H) 07/23/2009 1131   LYMPHSABS 1.3 08/28/2020 1520   LYMPHSABS 3.0 07/23/2009 1131   MONOABS 1.8 (H) 08/28/2020 1520   EOSABS 0.0 08/28/2020 1520   EOSABS 0.3 07/23/2009 1131   BASOSABS 0.0 08/28/2020 1520   BASOSABS 0.1 07/23/2009 1131    BMET    Component Value Date/Time   NA 137 09/20/2020 1009   K 3.7 09/20/2020 1009   CL 102 09/20/2020 1009   CO2 25 09/20/2020 1009   GLUCOSE 95 09/20/2020 1009   BUN 10 09/20/2020 1009   CREATININE 0.99 09/20/2020 1009   CREATININE 0.80 09/02/2019 0914   CALCIUM 9.5 09/20/2020 1009   GFRNONAA >60 09/20/2020 1009   GFRAA >60 09/19/2018 1315    BNP    Component Value Date/Time   BNP 114.4 (H) 09/09/2020 1255    ProBNP No results found for: PROBNP  Imaging: No results found.   Assessment & Plan:    OSA (obstructive sleep apnea) - HST 08/08/20 showed severe OSA AHI 36.4 with SpO2 low 81%  - Started on Auto CPAP 5-20cm h20, received machine early December 2021. She had C-section delivery in mid-December, her baby is currently in the ICU. She just recently started wearing CPAP mask, she has worn the last 3 night for average fo 6 hours. Residual AHI 0.7  - Recommend decreasing auto CPAP pressure to 5-15cm h20 for comfort - FU in 6 week televisit for compliance   Asthma - Breathing improved after delivery and starting HF medication - Denies shortness of breath, chest tightness or wheezing - Patient has already stopped Pulmicort, continue to use albuteorl hfa 2 puffs q 4-6 hours prn shortness of breath/wheezing  Glenford Bayley, NP 09/28/2020

## 2020-09-28 NOTE — Assessment & Plan Note (Addendum)
-   HST 08/08/20 showed severe OSA AHI 36.4 with SpO2 low 81%  - Started on Auto CPAP 5-20cm h20, received machine early December 2021. She had C-section delivery in mid-December, her baby is currently in the ICU. She just recently started wearing CPAP mask, she has worn the last 3 night for average fo 6 hours. Residual AHI 0.7  - Recommend decreasing auto CPAP pressure to 5-15cm h20 for comfort - FU in 6 week televisit for compliance

## 2020-09-28 NOTE — Assessment & Plan Note (Addendum)
-   Breathing improved after delivery and starting HF medication - Denies shortness of breath, chest tightness or wheezing - Patient has already stopped Pulmicort, continue to use albuteorl hfa 2 puffs q 4-6 hours prn shortness of breath/wheezing

## 2020-09-30 ENCOUNTER — Encounter (HOSPITAL_COMMUNITY): Payer: Self-pay

## 2020-09-30 ENCOUNTER — Other Ambulatory Visit (HOSPITAL_COMMUNITY): Payer: Self-pay | Admitting: Internal Medicine

## 2020-09-30 ENCOUNTER — Other Ambulatory Visit (HOSPITAL_COMMUNITY): Payer: Self-pay | Admitting: *Deleted

## 2020-09-30 DIAGNOSIS — G4733 Obstructive sleep apnea (adult) (pediatric): Secondary | ICD-10-CM | POA: Diagnosis not present

## 2020-09-30 MED ORDER — SACUBITRIL-VALSARTAN 24-26 MG PO TABS: 1.0000 | ORAL_TABLET | Freq: Two times a day (BID) | ORAL | 3 refills | Status: DC

## 2020-09-30 MED FILL — FLUoxetine HCL 20 MG CAPS: 20 | 30 days supply | Qty: 30 | Fill #0

## 2020-09-30 MED FILL — ISOSORBIDE DN 10 MG TABLET: 10 | 30 days supply | Qty: 90 | Fill #0

## 2020-09-30 MED FILL — ENTRESTO 24 MG-26 MG TABLET: 24-26 | 90 days supply | Qty: 180 | Fill #0

## 2020-09-30 MED FILL — FUROSEMIDE 20 MG TABS: 20 | 30 days supply | Qty: 30 | Fill #0

## 2020-09-30 MED FILL — DIGOXIN 0.125 MG TABLET: 125 | 30 days supply | Qty: 30 | Fill #0

## 2020-09-30 MED FILL — hydrALAZINE HCL 50 MG TABS: 50 | 30 days supply | Qty: 90 | Fill #0

## 2020-09-30 MED FILL — CARVEDILOL 3.125 MG TABLET: 3.125 | 30 days supply | Qty: 60 | Fill #0

## 2020-09-30 MED FILL — valACYclovir HCL 1 GM TABS: 1 | 90 days supply | Qty: 90 | Fill #1

## 2020-10-01 ENCOUNTER — Other Ambulatory Visit (HOSPITAL_COMMUNITY): Payer: Self-pay | Admitting: Internal Medicine

## 2020-10-01 ENCOUNTER — Encounter (HOSPITAL_COMMUNITY): Payer: Self-pay

## 2020-10-01 ENCOUNTER — Other Ambulatory Visit (HOSPITAL_COMMUNITY): Payer: Self-pay

## 2020-10-01 MED ORDER — SACUBITRIL-VALSARTAN 24-26 MG PO TABS: 1.0000 | ORAL_TABLET | Freq: Two times a day (BID) | ORAL | 3 refills | Status: DC

## 2020-10-05 ENCOUNTER — Ambulatory Visit (HOSPITAL_COMMUNITY)
Admission: RE | Admit: 2020-10-05 | Discharge: 2020-10-05 | Disposition: A | Discharge status: Home / Self Care | Payer: BC Managed Care – PPO | Source: Ambulatory Visit | Attending: Cardiology | Admitting: Cardiology

## 2020-10-05 ENCOUNTER — Other Ambulatory Visit (HOSPITAL_COMMUNITY): Payer: Self-pay | Admitting: Cardiology

## 2020-10-05 ENCOUNTER — Other Ambulatory Visit (HOSPITAL_COMMUNITY): Payer: Self-pay | Admitting: Internal Medicine

## 2020-10-05 ENCOUNTER — Other Ambulatory Visit: Payer: Self-pay

## 2020-10-05 ENCOUNTER — Ambulatory Visit (HOSPITAL_COMMUNITY)
Admission: RE | Admit: 2020-10-05 | Discharge: 2020-10-05 | Disposition: A | Discharge status: Home / Self Care | Payer: BC Managed Care – PPO | Source: Ambulatory Visit | Attending: Internal Medicine | Admitting: Internal Medicine

## 2020-10-05 VITALS — BP 110/66 | HR 60 | Wt 250.6 lb

## 2020-10-05 DIAGNOSIS — R000 Tachycardia, unspecified: Secondary | ICD-10-CM | POA: Diagnosis not present

## 2020-10-05 DIAGNOSIS — G4733 Obstructive sleep apnea (adult) (pediatric): Secondary | ICD-10-CM | POA: Diagnosis not present

## 2020-10-05 DIAGNOSIS — R002 Palpitations: Secondary | ICD-10-CM

## 2020-10-05 DIAGNOSIS — I110 Hypertensive heart disease with heart failure: Secondary | ICD-10-CM | POA: Diagnosis not present

## 2020-10-05 DIAGNOSIS — I5022 Chronic systolic (congestive) heart failure: Secondary | ICD-10-CM

## 2020-10-05 DIAGNOSIS — E119 Type 2 diabetes mellitus without complications: Secondary | ICD-10-CM | POA: Insufficient documentation

## 2020-10-05 DIAGNOSIS — I5082 Biventricular heart failure: Secondary | ICD-10-CM | POA: Diagnosis not present

## 2020-10-05 LAB — BASIC METABOLIC PANEL
Anion gap: 9 (ref 5–15)
BUN: 5 mg/dL — ABNORMAL LOW (ref 6–20)
CO2: 27 mmol/L (ref 22–32)
Calcium: 9.1 mg/dL (ref 8.9–10.3)
Chloride: 101 mmol/L (ref 98–111)
Creatinine, Ser: 0.86 mg/dL (ref 0.44–1.00)
GFR, Estimated: >60 mL/min (ref >60)
Glucose, Bld: 104 mg/dL — ABNORMAL HIGH (ref 70–99)
Potassium: 3.9 mmol/L (ref 3.5–5.1)
Sodium: 137 mmol/L (ref 135–145)

## 2020-10-05 LAB — BRAIN NATRIURETIC PEPTIDE: B Natriuretic Peptide: 61.4 pg/mL (ref 0.0–100.0)

## 2020-10-05 MED ORDER — HYDRALAZINE HCL 50 MG PO TABS: 25.0000 mg | ORAL_TABLET | Freq: Three times a day (TID) | ORAL | 6 refills | Status: DC

## 2020-10-05 NOTE — Patient Instructions (Addendum)
It was a pleasure seeing you today!  MEDICATIONS: -We are changing your medications today -Decrease hydralazine to 25 mg (1/2 tablet) three times daily  - Wear Zio patch for 72 hours - We will call you regarding any changes to your diuretic regimen pending lab results.  -Call if you have questions about your medications.   NEXT APPOINTMENT: Return to clinic in 1 month with Dr. Gala Romney.  In general, to take care of your heart failure: -Limit your fluid intake to 2 Liters (half-gallon) per day.   -Limit your salt intake to ideally 2-3 grams (2000-3000 mg) per day. -Weigh yourself daily and record, and bring that "weight diary" to your next appointment.  (Weight gain of 2-3 pounds in 1 day typically means fluid weight.) -The medications for your heart are to help your heart and help you live longer.   -Please contact us before stopping any of your heart medications.  Call the clinic at [phone] with questions or to reschedule future appointments.

## 2020-10-06 DIAGNOSIS — I509 Heart failure, unspecified: Secondary | ICD-10-CM | POA: Diagnosis not present

## 2020-10-13 DIAGNOSIS — Z30430 Encounter for insertion of intrauterine contraceptive device: Secondary | ICD-10-CM | POA: Diagnosis not present

## 2020-10-13 DIAGNOSIS — Z3202 Encounter for pregnancy test, result negative: Secondary | ICD-10-CM | POA: Diagnosis not present

## 2020-10-19 ENCOUNTER — Encounter (HOSPITAL_COMMUNITY): Payer: BC Managed Care – PPO

## 2020-10-25 ENCOUNTER — Encounter (HOSPITAL_COMMUNITY): Payer: Self-pay

## 2020-10-25 NOTE — Telephone Encounter (Signed)
Monitor was normal.

## 2020-10-26 ENCOUNTER — Encounter (HOSPITAL_COMMUNITY): Payer: BC Managed Care – PPO

## 2020-10-27 ENCOUNTER — Encounter (HOSPITAL_COMMUNITY): Payer: BC Managed Care – PPO

## 2020-10-29 MED FILL — CARVEDILOL 3.125 MG TABLET: 3.125 | 30 days supply | Qty: 60 | Fill #1

## 2020-10-29 MED FILL — DIGOXIN 0.125 MG TABLET: 125 | 30 days supply | Qty: 30 | Fill #1

## 2020-10-29 MED FILL — hydrALAZINE HCL 50 MG TABS: 50 | 30 days supply | Qty: 90 | Fill #1

## 2020-10-29 MED FILL — ISOSORBIDE DN 10 MG TABLET: 10 | 30 days supply | Qty: 90 | Fill #1

## 2020-10-31 DIAGNOSIS — G4733 Obstructive sleep apnea (adult) (pediatric): Secondary | ICD-10-CM | POA: Diagnosis not present

## 2020-11-05 MED FILL — CORLANOR 5 MG TABLET: 5 | 30 days supply | Qty: 30 | Fill #0

## 2020-11-05 MED FILL — FLUoxetine HCL 20 MG CAPS: 20 | 30 days supply | Qty: 30 | Fill #1

## 2020-11-05 MED FILL — FUROSEMIDE 20 MG TABS: 20 | 30 days supply | Qty: 30 | Fill #1

## 2020-11-08 NOTE — Progress Notes (Signed)
Virtual Visit via Telephone Note  I connected with SHAQUASIA CAPONIGRO on 11/08/20 at  9:00 AM EST by telephone and verified that I am speaking with the correct person using two identifiers.  Location: Patient: Home Provider: Office   I discussed the limitations, risks, security and privacy concerns of performing an evaluation and management service by telephone and the availability of in person appointments. I also discussed with the patient that there may be a patient responsible charge related to this service. The patient expressed understanding and agreed to proceed.   History of Present Illness: 40 -year-old female, never smoked.  Past medical history significant for obstructive sleep apnea, childhood asthma, hypertension, chronic diastolic heart failure, cardiomegaly, vitamin D deficiency. Patient of Dr. Wynona Neat, seen for initial consult on 07/07/2020 for shortness of breath nocturnal wheezing.  Patient was given prescription for Pulmicort 180 MCG Flexhaler to use twice daily with albuterol inhaler and ordered for home sleep study for possible underlying obstructive sleep apnea.  Advised to follow-up in 6 weeks.  Previous [facility] pulmonary encounter: 09/28/2020 Patient presents today for for an overdue follow-up.  Sleep study completed on 08/08/2020 which showed severe obstructive sleep apnea with mild oxygen desaturations.  Recommended CPAP therapy auto titrate 5 to 20 cm H2O. patient received CPAP machine beginning of December 2021.  Patient as admitted from 08/24/2020 - 09/01/2020 with hypertension and lower extremity edema and found to have acute systolic hearty failure. Echo completed and showed biventricular dysfunction with EF 15-20%. She was started on Lasix and HF meds. She had cesarean section 08/28/20 without complications, had incisional VAC. She established with cardiology on 09/09/20 outpatient following hospitalization. Continues hydralazine/nitrates, Entresto 24/26 BID, Digoxin  0.149mcg daily, Spirolactone 12.5mg  daily.   She is doing ok. Has no breathing complaints. Denies wheezing or shortness of breath. Stopped using Pulmicort and has not required SABA. Her baby is in the NICU. She just started wearing CPAP, pressure feeling a little high. She has not problems sleeping at night. No significant improvement in energy level.   Airview download 08/28/20-09/27/19 3/30 days (10%); 3 days (10%) > 4 hours Average usage days used 6 hours 14 mins Pressure 5-20cm h20 (8.3cm h20-95%) Airleak 0.8L/min (95%) AHI 0.7  11/09/2020 Interim  Patient contacted for 6 week follow-up. Patient has a newborn at home. During last visit encouraged patient improve his compliance with CPAP machine. We changed CPAP pressure setting to 5-15cm h20 but per download it remains set at 5-20cmh20. She is doing well, she has been compliant with CPAP use. No issues with mask fit or pressure setting. At times pressure can feel too strong. She is getting on average 6-7 hours of sleep a night, this can be interrupted d/t having a newborn at home. She has not noticed a significant improvement in her energy level yet but feels she will be able to judge this better when she returns to work in the next couple of weeks. She is off Pulmicort inhaler and has not required albuterol hfa prn shortness of breath.   Airview download 10/09/20-11/07/20 27/30 days (90%); 26 (87%) > 4 hours Average usage 6 hours 38 mins Pressure 5-20 cm h20 (10.2cm h20-95%) Airleaks 4.3L/min (95%) AHI 1.3   Observations/Objective:  - Able to speak in full sentences; no overt shortness of breath  Assessment and Plan:  Severe OSA on CPAP: - Patient is 87% compliant with CPAP > 4 hours - Pressure 5-20cm; residual AHI 1.3 - Adjusting CPAP pressure to 5-15cm h20 for comfort - Encouraged  patient to continue to wear CPAP every night for minimum 4-6 hours or longer - Advised not ot drive if experiencing excessive daytime fatigue or  somnolence  Mild intermittent asthma: - Asymptomatic; no SABA use  - Not currently taking Pulmicort - Continue prn Albuterol hfa 2 puffs every 4-6 hours for breakthrough sob/wheezing   Follow Up Instructions:   - 6 months or sooner if needed  I discussed the assessment and treatment plan with the patient. The patient was provided an opportunity to ask questions and all were answered. The patient agreed with the plan and demonstrated an understanding of the instructions.   The patient was advised to call back or seek an in-person evaluation if the symptoms worsen or if the condition fails to improve as anticipated.  I provided 18 minutes of non-face-to-face time during this encounter.   Glenford Bayley, NP

## 2020-11-09 ENCOUNTER — Other Ambulatory Visit: Payer: Self-pay

## 2020-11-09 ENCOUNTER — Encounter: Payer: Self-pay | Admitting: Primary Care

## 2020-11-09 ENCOUNTER — Ambulatory Visit (INDEPENDENT_AMBULATORY_CARE_PROVIDER_SITE_OTHER): Payer: BC Managed Care – PPO | Admitting: Primary Care

## 2020-11-09 DIAGNOSIS — G4733 Obstructive sleep apnea (adult) (pediatric): Secondary | ICD-10-CM

## 2020-11-09 MED FILL — SPIRONOLACTONE 25 MG TABS: 25 | 60 days supply | Qty: 30 | Fill #0

## 2020-11-09 NOTE — Patient Instructions (Signed)
Great work wearing CPAP, continue to wear every night for minimum 4 hours Do not drive if experiencing excessive daytime fatigue Use albuterol 2 puffs every 6 hours as needed for shortness of breath/wheezing  Follow-up 6 months

## 2020-11-10 DIAGNOSIS — Z30431 Encounter for routine checking of intrauterine contraceptive device: Secondary | ICD-10-CM | POA: Diagnosis not present

## 2020-11-10 NOTE — Progress Notes (Signed)
Advanced Heart Failure Clinic Note   PCP: Donato Schultz, DO PCP-Cardiologist: Chilton Si, MD  HF Cardiologist: Dr. Gala Romney, MD  HPI:  [name] is a 40 year old female with a history of obesity, DM, HTN, asthma, OSA and severe systolic HF.  Echo 2010  EF 55-60%  Admitted 12/21 at [redacted] weeks pregnant, with first baby, with hypertension and lower extremity edema and found to have acute systolic heart failure. Echo completed and showed biventricular dysfunction with EF 15-20%. CO-OX consistent with cardiogenic shock.  Diuresed over 50 pounds and GDMT initiated   Here today for follow up. Feels good. No SOB, edema, orthopnea or PND. BP much improved. Taking all meds without problem  [name] here with her today.   Echo today 11/12/20: EF 30% mild to mod RV dysfunction Personally reviewed   Echo 08/24/20: Biventricular HF EF < 20%   FH: Dad has A FIb, Aunt heart failure.  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    Past Medical History:  Diagnosis Date  . Anemia   . Asthma   . Genital herpes   . Gestational diabetes   . Obesity   . Pre-diabetes   . Thyroid disease     Current Outpatient Medications  Medication Sig Dispense Refill  . albuterol (PROAIR HFA) 108 (90 Base) MCG/ACT inhaler Inhale 2 puffs into the lungs every 6 (six) hours as needed. 6.7 g 1  . aspirin 81 MG chewable tablet Chew 81 mg by mouth daily.    . budesonide (PULMICORT FLEXHALER) 180 MCG/ACT inhaler Inhale 2 puffs into the lungs every 12 (twelve) hours. 1 each 3  . calcium carbonate (TUMS - DOSED IN MG ELEMENTAL CALCIUM) 500 MG chewable tablet Chew 1-2 tablets by mouth daily as needed for indigestion or heartburn.    . carvedilol (COREG) 3.125 MG tablet Take 1 tablet (3.125 mg total) by mouth 2 (two) times daily with a meal. 60 tablet 6  . digoxin (LANOXIN) 0.125 MG tablet Take 1 tablet (0.125 mg total) by mouth daily. 30 tablet 6  .  diphenhydramine-acetaminophen (TYLENOL PM) 25-500 MG TABS tablet Take 1 tablet by mouth at bedtime as needed (sleep).    . ergocalciferol (VITAMIN D2) 1.25 MG (50000 UT) capsule Take 1,250 mcg by mouth daily.    [name] FLUoxetine (PROZAC) 20 MG capsule Take 1 capsule (20 mg total) by mouth daily. 30 capsule 3  . furosemide (LASIX) 20 MG tablet Take 1 tablet (20 mg total) by mouth daily. 30 tablet 6  . hydrALAZINE (APRESOLINE) 50 MG tablet Take 0.5 tablets (25 mg total) by mouth every 8 (eight) hours. 45 tablet 6  . isosorbide dinitrate (ISORDIL) 10 MG tablet Take 1 tablet (10 mg total) by mouth 3 (three) times daily. 90 tablet 6  . ivabradine (CORLANOR) 5 MG TABS tablet Take 0.5 tablets (2.5 mg total) by mouth 2 (two) times daily with a meal. 30 tablet 3  . potassium chloride SA (KLOR-CON) 20 MEQ tablet Take 1 tablet (20 mEq total) by mouth daily. 90 tablet 3  . Prenatal Vit-Fe Fumarate-FA (PRENATAL MULTIVITAMIN) TABS tablet Take 1 tablet by mouth daily at 12 noon.    . sacubitril-valsartan (ENTRESTO) 24-26 MG Take 1 tablet by mouth 2 (two) times daily. 180 tablet 3  . senna-docusate (SENOKOT-S) 8.6-50 MG tablet Take 2 tablets by mouth 2 (two) times daily. 60 tablet 6  . spironolactone (ALDACTONE) 25 MG tablet Take 0.5 tablets (12.5 mg total)  by mouth daily. 30 tablet 6  . valACYclovir (VALTREX) 1000 MG tablet Take 1 tablet (1,000 mg total) by mouth daily. 90 tablet 3  . witch hazel-glycerin (TUCKS) pad Apply 1 application topically as needed for hemorrhoids. 40 each 12   No current facility-administered medications for this encounter.    Allergies  Allergen Reactions  . Clomiphene Hives    Hives and lips swelling.     Social History   Socioeconomic History  . Marital status: Married    Spouse name: Not on file  . Number of children: Not on file  . Years of education: Not on file  . Highest education level: Not on file  Occupational History  . 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 Smoking status: Never Smoker  . Smokeless tobacco: Never Used  Vaping Use  . Vaping Use: Never used  Substance and Sexual Activity  . Alcohol use: Yes    Comment: SOCIAL   . Drug use: No  . Sexual activity: Yes    Partners: Male    Birth control/protection: None    Comment: 1st intercourse- 37, partners- 10, current partner- 4 yrs   Other Topics Concern  . Not on file  Social History Narrative  . Not on file   Social Determinants of Health   Financial Resource Strain: Not on file  Food Insecurity: No Food Insecurity  . Worried About Programme researcher, broadcasting/film/video in the Last Year: Never true  . Ran Out of Food in the Last Year: Never true  Transportation Needs: Not on file  Physical Activity: Not on file  Stress: Not on file  Social Connections: Not on file  Intimate Partner Violence: Not on file     Family History  Problem Relation Age of Onset  . Hyperlipidemia Mother   . Hypertension Mother   . Depression Father   . Hyperlipidemia Father   . Heart disease Father 34       MI  . Hypertension Father   . Cancer Father 42       prostate  . Cancer Paternal Grandfather        prostate  . Coronary artery disease Other   . Prostate cancer Other   . Cancer Maternal Grandfather        prostate    Vitals:   11/12/20 0953  BP: 110/70  Pulse: 75  SpO2: 99%  Weight: 115 kg (253 lb 9.6 oz)    Wt Readings from Last 3 Encounters:  10/05/20 113.7 kg (250 lb 9.6 oz)  09/28/20 112.6 kg (248 lb 3.2 oz)  09/09/20 114 kg (251 lb 6.4 oz)    PHYSICAL EXAM: General:  Well appearing. No resp difficulty HEENT: normal Neck: supple. no JVD. Carotids 2+ bilat; no bruits. No lymphadenopathy or thryomegaly appreciated. Cor: PMI nondisplaced. Regular rate & rhythm. No rubs, gallops or murmurs. Lungs: clear Abdomen: obese soft, nontender, nondistended. No hepatosplenomegaly. No bruits or masses. Good bowel sounds. Extremities: no cyanosis, clubbing, rash,  edema Neuro: alert & orientedx3, cranial nerves grossly intact. moves all 4 extremities w/o difficulty. Affect pleasant   ASSESSMENT & PLAN : 1. Chronic Biventricular Heart Failure - Echo 12/21 severely reduced EF 15-20% . Possible HTN CM. - C-section 12/11 without complications.  - Echo today 11/12/20: EF 30% mild to mod RV dysfunction Personally reviewed - Stable  NYHA II. Volume status stable,  - Continue carvedilol 3.125 mg bid.  - Continue hydralazine 25 tid/isordil  10 tid - Continue Entresto 24/26 bid.  - Continue spiro 12.5 mg daily. - Continue ivabradine - Stop digoxin  - Add Farxiga - Discussed reliable birth control & avoidance of future pregnancy. - Labs today  2. Sinus tachycardia - Improved with ivabradine  3. HTN Much improved   4. DM2 - a1c 5.4 (12/21) - Continue metformin + Lantus - Per PCP. - add Farxiga  5. OSA - Recently diagnosed.  - Encouraged nightly CPAP use.  Arvilla Meres, MD 11/10/20

## 2020-11-12 ENCOUNTER — Encounter (HOSPITAL_COMMUNITY): Payer: Self-pay

## 2020-11-12 ENCOUNTER — Other Ambulatory Visit (HOSPITAL_COMMUNITY): Payer: Self-pay | Admitting: Internal Medicine

## 2020-11-12 ENCOUNTER — Encounter (HOSPITAL_COMMUNITY): Payer: Self-pay | Admitting: Internal Medicine

## 2020-11-12 ENCOUNTER — Telehealth (HOSPITAL_COMMUNITY): Payer: Self-pay | Admitting: Pharmacist

## 2020-11-12 ENCOUNTER — Ambulatory Visit (HOSPITAL_COMMUNITY)
Admission: RE | Admit: 2020-11-12 | Discharge: 2020-11-12 | Disposition: A | Discharge status: Home / Self Care | Payer: BC Managed Care – PPO | Source: Ambulatory Visit | Attending: Internal Medicine | Admitting: Internal Medicine

## 2020-11-12 ENCOUNTER — Ambulatory Visit (HOSPITAL_BASED_OUTPATIENT_CLINIC_OR_DEPARTMENT_OTHER)
Admission: RE | Admit: 2020-11-12 | Discharge: 2020-11-12 | Disposition: A | Discharge status: Home / Self Care | Payer: BC Managed Care – PPO | Source: Ambulatory Visit | Attending: Internal Medicine | Admitting: Internal Medicine

## 2020-11-12 ENCOUNTER — Other Ambulatory Visit: Payer: Self-pay

## 2020-11-12 VITALS — BP 110/70 | HR 75 | Wt 253.6 lb

## 2020-11-12 DIAGNOSIS — Z7951 Long term (current) use of inhaled steroids: Secondary | ICD-10-CM | POA: Diagnosis not present

## 2020-11-12 DIAGNOSIS — O99213 Obesity complicating pregnancy, third trimester: Secondary | ICD-10-CM | POA: Insufficient documentation

## 2020-11-12 DIAGNOSIS — Z794 Long term (current) use of insulin: Secondary | ICD-10-CM | POA: Diagnosis not present

## 2020-11-12 DIAGNOSIS — G4733 Obstructive sleep apnea (adult) (pediatric): Secondary | ICD-10-CM

## 2020-11-12 DIAGNOSIS — I5042 Chronic combined systolic (congestive) and diastolic (congestive) heart failure: Secondary | ICD-10-CM | POA: Diagnosis not present

## 2020-11-12 DIAGNOSIS — R000 Tachycardia, unspecified: Secondary | ICD-10-CM | POA: Diagnosis not present

## 2020-11-12 DIAGNOSIS — Z7982 Long term (current) use of aspirin: Secondary | ICD-10-CM | POA: Insufficient documentation

## 2020-11-12 DIAGNOSIS — I5022 Chronic systolic (congestive) heart failure: Secondary | ICD-10-CM | POA: Diagnosis not present

## 2020-11-12 DIAGNOSIS — O99353 Diseases of the nervous system complicating pregnancy, third trimester: Secondary | ICD-10-CM | POA: Insufficient documentation

## 2020-11-12 DIAGNOSIS — Z3A30 30 weeks gestation of pregnancy: Secondary | ICD-10-CM | POA: Insufficient documentation

## 2020-11-12 DIAGNOSIS — I5082 Biventricular heart failure: Secondary | ICD-10-CM | POA: Diagnosis not present

## 2020-11-12 DIAGNOSIS — O24113 Pre-existing diabetes mellitus, type 2, in pregnancy, third trimester: Secondary | ICD-10-CM | POA: Diagnosis not present

## 2020-11-12 DIAGNOSIS — I110 Hypertensive heart disease with heart failure: Secondary | ICD-10-CM | POA: Insufficient documentation

## 2020-11-12 DIAGNOSIS — Z7901 Long term (current) use of anticoagulants: Secondary | ICD-10-CM | POA: Diagnosis not present

## 2020-11-12 DIAGNOSIS — O10913 Unspecified pre-existing hypertension complicating pregnancy, third trimester: Secondary | ICD-10-CM | POA: Diagnosis not present

## 2020-11-12 DIAGNOSIS — Z79899 Other long term (current) drug therapy: Secondary | ICD-10-CM | POA: Insufficient documentation

## 2020-11-12 DIAGNOSIS — I10 Essential (primary) hypertension: Secondary | ICD-10-CM

## 2020-11-12 LAB — CBC
HCT: 40.2 % (ref 36.0–46.0)
Hemoglobin: 12 g/dL (ref 12.0–15.0)
MCH: 22 pg — ABNORMAL LOW (ref 26.0–34.0)
MCHC: 29.9 g/dL — ABNORMAL LOW (ref 30.0–36.0)
MCV: 73.6 fL — ABNORMAL LOW (ref 80.0–100.0)
Platelets: 277 10*3/uL (ref 150–400)
RBC: 5.46 MIL/uL — ABNORMAL HIGH (ref 3.87–5.11)
RDW: 16.5 % — ABNORMAL HIGH (ref 11.5–15.5)
WBC: 13.5 10*3/uL — ABNORMAL HIGH (ref 4.0–10.5)
nRBC: 0 % (ref 0.0–0.2)

## 2020-11-12 LAB — ECHOCARDIOGRAM COMPLETE
Area-P 1/2: 3.99 cm2
Calc EF: 33.6 %
S' Lateral: 4.8 cm
Single Plane A2C EF: 36.5 %
Single Plane A4C EF: 31.7 %

## 2020-11-12 LAB — BASIC METABOLIC PANEL
Anion gap: 7 (ref 5–15)
BUN: <5 mg/dL — ABNORMAL LOW (ref 6–20)
CO2: 26 mmol/L (ref 22–32)
Calcium: 9.2 mg/dL (ref 8.9–10.3)
Chloride: 104 mmol/L (ref 98–111)
Creatinine, Ser: 0.76 mg/dL (ref 0.44–1.00)
GFR, Estimated: >60 mL/min (ref >60)
Glucose, Bld: 101 mg/dL — ABNORMAL HIGH (ref 70–99)
Potassium: 3.6 mmol/L (ref 3.5–5.1)
Sodium: 137 mmol/L (ref 135–145)

## 2020-11-12 LAB — BRAIN NATRIURETIC PEPTIDE: B Natriuretic Peptide: 58.7 pg/mL (ref 0.0–100.0)

## 2020-11-12 MED ORDER — DAPAGLIFLOZIN PROPANEDIOL 10 MG PO TABS: 10.0000 mg | ORAL_TABLET | Freq: Every day | ORAL | 11 refills | Status: DC

## 2020-11-12 NOTE — Telephone Encounter (Signed)
Advanced Heart Failure Patient Advocate Encounter  Prior Authorization for Marcelline Deist has been approved.    PA# [id number] Effective dates: 11/12/20 through 11/12/21  Karle Plumber, PharmD, BCPS, BCCP, CPP Heart Failure Clinic Pharmacist [phone]

## 2020-11-12 NOTE — Patient Instructions (Signed)
STOP Digoxin START Farxiga 10 mg, one tab daily  Labs today We will only contact you if something comes back abnormal or we need to make some changes. Otherwise no news is good news!  Your physician recommends that you schedule a follow-up appointment in: 2 months   Do the following things EVERYDAY: 1) Weigh yourself in the morning before breakfast. Write it down and keep it in a log. 2) Take your medicines as prescribed 3) Eat low salt foods--Limit salt (sodium) to 2000 mg per day.  4) Stay as active as you can everyday 5) Limit all fluids for the day to less than 2 liters At the [facility], you and your health needs are our priority. As part of our continuing mission to provide you with exceptional heart care, we have created designated Provider Care Teams. These Care Teams include your primary Cardiologist (physician) and Advanced Practice Providers (APPs- Physician Assistants and Nurse Practitioners) who all work together to provide you with the care you need, when you need it.   You may see any of the following providers on your designated Care Team at your next follow up: [name] Dr Arvilla Meres . Dr Marca Ancona . Dr Thornell Mule . Tonye Becket, NP . Robbie Lis, PA . Shanda Bumps [place],NP . Karle Plumber, PharmD   Please be sure to bring in all your medications bottles to every appointment.   If you have any questions or concerns before your next appointment please send [place] a message through [place] or call our office at [phone].    TO LEAVE A MESSAGE FOR THE NURSE SELECT OPTION 2, PLEASE LEAVE A MESSAGE INCLUDING: . YOUR NAME . DATE OF BIRTH . CALL BACK NUMBER . REASON FOR CALL**this is important as we prioritize the call backs  YOU WILL RECEIVE A CALL BACK THE SAME DAY AS LONG AS YOU CALL BEFORE 4:00 PM

## 2020-11-12 NOTE — Progress Notes (Signed)
Echocardiogram 2D Echocardiogram has been performed.  [name] 11/12/2020, 9:44 AM

## 2020-11-15 ENCOUNTER — Telehealth (HOSPITAL_COMMUNITY): Payer: Self-pay | Admitting: *Deleted

## 2020-11-15 NOTE — Telephone Encounter (Signed)
[name] with disability requests a call back from the nurse that filled out pts paperwork.  Routed to [facility]  Call back # [phone]

## 2020-11-16 ENCOUNTER — Encounter (HOSPITAL_COMMUNITY): Payer: Self-pay | Admitting: *Deleted

## 2020-11-16 NOTE — Telephone Encounter (Signed)
Spoke w/Kris, she wanted clarification on letter that was provided to pt at appt 11/12/20. 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 Explained this to Gaylyn Rong, she has about a time frame of pt's recovery, explained pt is improving, she is sch to see Dr Gala Romney back on 4/25 and will re-evaluate at that time.

## 2020-11-24 ENCOUNTER — Encounter (HOSPITAL_COMMUNITY): Payer: Self-pay

## 2020-11-25 MED FILL — SPIRONOLACTONE 25 MG TABS: 25 | 60 days supply | Qty: 30 | Fill #0

## 2020-11-25 MED FILL — FARXIGA 10 MG TABLET: 10 | 30 days supply | Qty: 30 | Fill #0

## 2020-11-25 MED FILL — ISOSORBIDE DN 10 MG TABLET: 10 | 30 days supply | Qty: 90 | Fill #2

## 2020-11-25 MED FILL — CARVEDILOL 3.125 MG TABLET: 3.125 | 30 days supply | Qty: 60 | Fill #2

## 2020-11-25 MED FILL — hydrALAZINE HCL 50 MG TABS: 50 | 30 days supply | Qty: 90 | Fill #2

## 2020-11-25 NOTE — Telephone Encounter (Signed)
Double lasix for 2 days

## 2020-11-28 DIAGNOSIS — G4733 Obstructive sleep apnea (adult) (pediatric): Secondary | ICD-10-CM | POA: Diagnosis not present

## 2020-11-30 ENCOUNTER — Encounter (HOSPITAL_COMMUNITY): Payer: Self-pay

## 2020-11-30 ENCOUNTER — Ambulatory Visit (HOSPITAL_COMMUNITY)
Admission: RE | Admit: 2020-11-30 | Discharge: 2020-11-30 | Disposition: A | Discharge status: Home / Self Care | Payer: BC Managed Care – PPO | Source: Ambulatory Visit | Attending: Cardiology | Admitting: Cardiology

## 2020-11-30 ENCOUNTER — Other Ambulatory Visit (HOSPITAL_COMMUNITY): Payer: Self-pay | Admitting: Cardiology

## 2020-11-30 ENCOUNTER — Other Ambulatory Visit: Payer: Self-pay

## 2020-11-30 VITALS — BP 130/90 | HR 74 | Wt 251.6 lb

## 2020-11-30 DIAGNOSIS — Z8249 Family history of ischemic heart disease and other diseases of the circulatory system: Secondary | ICD-10-CM | POA: Diagnosis not present

## 2020-11-30 DIAGNOSIS — I5022 Chronic systolic (congestive) heart failure: Secondary | ICD-10-CM | POA: Diagnosis not present

## 2020-11-30 DIAGNOSIS — Z794 Long term (current) use of insulin: Secondary | ICD-10-CM | POA: Diagnosis not present

## 2020-11-30 DIAGNOSIS — E119 Type 2 diabetes mellitus without complications: Secondary | ICD-10-CM | POA: Diagnosis not present

## 2020-11-30 DIAGNOSIS — I10 Essential (primary) hypertension: Secondary | ICD-10-CM | POA: Diagnosis not present

## 2020-11-30 DIAGNOSIS — I110 Hypertensive heart disease with heart failure: Secondary | ICD-10-CM | POA: Insufficient documentation

## 2020-11-30 DIAGNOSIS — J45909 Unspecified asthma, uncomplicated: Secondary | ICD-10-CM | POA: Diagnosis not present

## 2020-11-30 DIAGNOSIS — E669 Obesity, unspecified: Secondary | ICD-10-CM | POA: Diagnosis not present

## 2020-11-30 DIAGNOSIS — G4733 Obstructive sleep apnea (adult) (pediatric): Secondary | ICD-10-CM | POA: Diagnosis not present

## 2020-11-30 DIAGNOSIS — Z888 Allergy status to other drugs, medicaments and biological substances status: Secondary | ICD-10-CM | POA: Insufficient documentation

## 2020-11-30 DIAGNOSIS — I5042 Chronic combined systolic (congestive) and diastolic (congestive) heart failure: Secondary | ICD-10-CM

## 2020-11-30 DIAGNOSIS — Z7982 Long term (current) use of aspirin: Secondary | ICD-10-CM | POA: Diagnosis not present

## 2020-11-30 DIAGNOSIS — I5082 Biventricular heart failure: Secondary | ICD-10-CM | POA: Diagnosis not present

## 2020-11-30 DIAGNOSIS — Z79899 Other long term (current) drug therapy: Secondary | ICD-10-CM | POA: Insufficient documentation

## 2020-11-30 LAB — BASIC METABOLIC PANEL
Anion gap: 5 (ref 5–15)
BUN: 7 mg/dL (ref 6–20)
CO2: 27 mmol/L (ref 22–32)
Calcium: 10 mg/dL (ref 8.9–10.3)
Chloride: 105 mmol/L (ref 98–111)
Creatinine, Ser: 0.84 mg/dL (ref 0.44–1.00)
GFR, Estimated: >60 mL/min (ref >60)
Glucose, Bld: 92 mg/dL (ref 70–99)
Potassium: 4.1 mmol/L (ref 3.5–5.1)
Sodium: 137 mmol/L (ref 135–145)

## 2020-11-30 MED ORDER — SPIRONOLACTONE 25 MG PO TABS: 25.0000 mg | ORAL_TABLET | Freq: Every day | ORAL | 11 refills | Status: DC

## 2020-11-30 MED ORDER — ENTRESTO 49-51 MG PO TABS: 1.0000 | ORAL_TABLET | Freq: Two times a day (BID) | ORAL | 11 refills | Status: DC

## 2020-11-30 NOTE — Progress Notes (Signed)
Advanced Heart Failure Clinic Note   PCP: Donato Schultz, DO PCP-Cardiologist: Chilton Si, MD  HF Cardiologist: Dr. Gala Romney, MD   Reason for Visit: F/u for chronic systolic heart failure   HPI:  [name] is a 40 year old female with a history of obesity, DM, HTN, asthma, OSA and severe systolic HF.  Echo 2010  EF 55-60%  Admitted 12/21 at [redacted] weeks pregnant, with first baby, with hypertension and lower extremity edema and found to have acute systolic heart failure. Echo completed and showed biventricular dysfunction with EF 15-20%. CO-OX consistent with cardiogenic shock.  Diuresed over 50 pounds and GDMT initiated   Has done fairly well postpartum. Had recent f/u w/ Dr. Gala Romney 11/12/20 and was doing well w/o symptoms. Echo was repeated. EF still low but mildly improved, up to 30% w/ mild-mod RV dysfunction. Digoxin was discontinued and Farxiga 10 mg daily was added.   On 3/10, pt contacted clinic w/ reports of increased dyspnea and 3 lb wt gain. She was advised to increase lasix to 40 mg daily x 2 days.   She presents back for f/u. Wt down 2 lb from previous visit. BP 130/90. Pulse rate 74 bpm. Continues w/ exertional dyspnea and fatigue. Wt still up ~3 lb per her home scale. Recent CBC 2/25 showed stable Hgb at 12. She has tolerated [place] ok w/o SE. No GU symptoms.    Note: Baby Girls' name is [name]    Echo 08/24/20: Biventricular HF EF < 20%  Echo 2/22: EF 30% mild to mod RV dysfunction    FH: Dad has A FIb, Aunt heart failure.  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    Past Medical History:  Diagnosis Date  . Anemia   . Asthma   . Genital herpes   . Gestational diabetes   . Obesity   . Pre-diabetes   . Thyroid disease     Current Outpatient Medications  Medication Sig Dispense Refill  . albuterol (PROAIR HFA) 108 (90 Base) MCG/ACT inhaler Inhale 2 puffs into the lungs every 6 (six) hours as needed. 6.7 g 1   . aspirin 81 MG chewable tablet Chew 81 mg by mouth daily.    . calcium carbonate (TUMS - DOSED IN MG ELEMENTAL CALCIUM) 500 MG chewable tablet Chew 1-2 tablets by mouth daily as needed for indigestion or heartburn.    . carvedilol (COREG) 3.125 MG tablet Take 1 tablet (3.125 mg total) by mouth 2 (two) times daily with a meal. 60 tablet 6  . dapagliflozin propanediol (FARXIGA) 10 MG TABS tablet Take 1 tablet (10 mg total) by mouth daily before breakfast. 30 tablet 11  . diphenhydramine-acetaminophen (TYLENOL PM) 25-500 MG TABS tablet Take 1 tablet by mouth at bedtime as needed (sleep).    . ergocalciferol (VITAMIN D2) 1.25 MG (50000 UT) capsule Take 1,250 mcg by mouth daily.    [name] FLUoxetine (PROZAC) 20 MG capsule Take 1 capsule (20 mg total) by mouth daily. 30 capsule 3  . furosemide (LASIX) 20 MG tablet Take 1 tablet (20 mg total) by mouth daily. 30 tablet 6  . hydrALAZINE (APRESOLINE) 50 MG tablet Take 0.5 tablets (25 mg total) by mouth every 8 (eight) hours. 45 tablet 6  . isosorbide dinitrate (ISORDIL) 10 MG tablet Take 1 tablet (10 mg total) by mouth 3 (three) times daily. 90 tablet 6  . ivabradine (CORLANOR) 5 MG TABS tablet Take 0.5 tablets (2.5 mg total) by mouth  2 (two) times daily with a meal. 30 tablet 3  . potassium chloride SA (KLOR-CON) 20 MEQ tablet Take 1 tablet (20 mEq total) by mouth daily. 90 tablet 3  . Prenatal Vit-Fe Fumarate-FA (PRENATAL MULTIVITAMIN) TABS tablet Take 1 tablet by mouth daily at 12 noon.    . sacubitril-valsartan (ENTRESTO) 24-26 MG Take 1 tablet by mouth 2 (two) times daily. 180 tablet 3  . senna-docusate (SENOKOT-S) 8.6-50 MG tablet Take 2 tablets by mouth 2 (two) times daily. 60 tablet 6  . spironolactone (ALDACTONE) 25 MG tablet Take 0.5 tablets (12.5 mg total) by mouth daily. 30 tablet 6  . valACYclovir (VALTREX) 1000 MG tablet Take 1 tablet (1,000 mg total) by mouth daily. 90 tablet 3   No current facility-administered medications for this encounter.     Allergies  Allergen Reactions  . Clomiphene Hives    Hives and lips swelling.     Social History   Socioeconomic History  . Marital status: Married    Spouse name: Not on file  . Number of children: Not on file  . Years of education: Not on file  . Highest education level: Not on file  Occupational History  . 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 Smoking status: Never Smoker  . Smokeless tobacco: Never Used  Vaping Use  . Vaping Use: Never used  Substance and Sexual Activity  . Alcohol use: Yes    Comment: SOCIAL   . Drug use: No  . Sexual activity: Yes    Partners: Male    Birth control/protection: None    Comment: 1st intercourse- 3, partners- 10, current partner- 4 yrs   Other Topics Concern  . Not on file  Social History Narrative  . Not on file   Social Determinants of Health   Financial Resource Strain: Not on file  Food Insecurity: No Food Insecurity  . Worried About Programme researcher, broadcasting/film/video in the Last Year: Never true  . Ran Out of Food in the Last Year: Never true  Transportation Needs: Not on file  Physical Activity: Not on file  Stress: Not on file  Social Connections: Not on file  Intimate Partner Violence: Not on file     Family History  Problem Relation Age of Onset  . Hyperlipidemia Mother   . Hypertension Mother   . Depression Father   . Hyperlipidemia Father   . Heart disease Father 27       MI  . Hypertension Father   . Cancer Father 38       prostate  . Cancer Paternal Grandfather        prostate  . Coronary artery disease Other   . Prostate cancer Other   . Cancer Maternal Grandfather        prostate    Vitals:   11/30/20 1436  BP: 130/90  Pulse: 74  SpO2: 99%  Weight: 114.1 kg (251 lb 9.6 oz)    Wt Readings from Last 3 Encounters:  11/30/20 114.1 kg (251 lb 9.6 oz)  11/12/20 115 kg (253 lb 9.6 oz)  10/05/20 113.7 kg (250 lb 9.6 oz)    PHYSICAL EXAM: General:  Well appearing young  female. No respiratory difficulty HEENT: normal Neck: supple. Thick neck JVD not well visualized. Carotids 2+ bilat; no bruits. No lymphadenopathy or thyromegaly appreciated. Cor: PMI nondisplaced. Regular rate & rhythm. No rubs, gallops or murmurs. Lungs: clear Abdomen: soft, nontender, nondistended. No hepatosplenomegaly. No bruits  or masses. Good bowel sounds. Extremities: no cyanosis, clubbing, rash, edema Neuro: alert & oriented x 3, cranial nerves grossly intact. moves all 4 extremities w/o difficulty. Affect pleasant.   ASSESSMENT & PLAN 1. Chronic Biventricular Heart Failure - Echo 12/21 severely reduced EF 15-20% . Possible HTN CM. - C-section 12/11 without complications.  - Repeat Echo: 11/12/20: EF 30% mild to mod RV dysfunction  - NYHA II-early III. Does not appear significantly fluid overloaded on exam  - Continue carvedilol 3.125 mg bid.  - Continue hydralazine 25 tid/isordil 10 tid - Increase Entresto to 49-51 mg bid.  - Increase spiro to 25 mg daily. - Continue ivabradine 5 mg daily (HR 74) - Continue Farxiga 10 mg daily  - Continue Lasix 20 daily (she will notify us if she loses wt too quickly, may need to scale back) - Check BMP today and again in 7 days  - Discussed reliable birth control & avoidance of future pregnancy.   2. Sinus tachycardia - Resolved. HR low 70s - c/w ivabradine  3. HTN - controlled on current regimen   4. DM2 - a1c 5.4 (12/21) - Continue metformin + Lantus - Per PCP. - now on Farxiga   5. OSA - Recently diagnosed.  - Encouraged nightly CPAP use.  F/u in 4 weeks w/ APP   Robbie Lis, PA-C 11/30/20

## 2020-11-30 NOTE — Patient Instructions (Signed)
Labs done today. We will contact you only if your labs are abnormal.  INCREASE Entresto to 49-51mg  (1 tablet) by mouth 2 times daily.  INCREASE Spironolactone to 25mg  (1 tablet) by mouth daily.  No other medication changes were made. Please continue all current medications as prescribed.  Your physician recommends that you schedule a follow-up appointment in: 1 week for a lab only appointment and in April for an appointment with May.    If you have any questions or concerns before your next appointment please send Robbie Lis a message through [place] or call our office at [phone].    TO LEAVE A MESSAGE FOR THE NURSE SELECT OPTION 2, PLEASE LEAVE A MESSAGE INCLUDING: . YOUR NAME . DATE OF BIRTH . CALL BACK NUMBER . REASON FOR CALL**this is important as we prioritize the call backs  YOU WILL RECEIVE A CALL BACK THE SAME DAY AS LONG AS YOU CALL BEFORE 4:00 PM   Do the following things EVERYDAY: 1) Weigh yourself in the morning before breakfast. Write it down and keep it in a log. 2) Take your medicines as prescribed 3) Eat low salt foods--Limit salt (sodium) to 2000 mg per day.  4) Stay as active as you can everyday 5) Limit all fluids for the day to less than 2 liters   At the [facility], you and your health needs are our priority. As part of our continuing mission to provide you with exceptional heart care, we have created designated Provider Care Teams. These Care Teams include your primary Cardiologist (physician) and Advanced Practice Providers (APPs- Physician Assistants and Nurse Practitioners) who all work together to provide you with the care you need, when you need it.   You may see any of the following providers on your designated Care Team at your next follow up: [phone] Dr [name] . Dr Arvilla Meres . Marca Ancona, NP . Tonye Becket, PA . Robbie Lis, PharmD   Please be sure to bring in all your medications bottles to every  appointment.

## 2020-12-02 ENCOUNTER — Encounter (HOSPITAL_COMMUNITY): Payer: Self-pay

## 2020-12-09 ENCOUNTER — Other Ambulatory Visit: Payer: Self-pay

## 2020-12-09 ENCOUNTER — Ambulatory Visit (HOSPITAL_COMMUNITY)
Admission: RE | Admit: 2020-12-09 | Discharge: 2020-12-09 | Disposition: A | Discharge status: Home / Self Care | Payer: BC Managed Care – PPO | Source: Ambulatory Visit | Attending: Cardiology | Admitting: Cardiology

## 2020-12-09 DIAGNOSIS — I5042 Chronic combined systolic (congestive) and diastolic (congestive) heart failure: Secondary | ICD-10-CM | POA: Insufficient documentation

## 2020-12-09 LAB — BASIC METABOLIC PANEL
Anion gap: 8 (ref 5–15)
BUN: 9 mg/dL (ref 6–20)
CO2: 25 mmol/L (ref 22–32)
Calcium: 9.3 mg/dL (ref 8.9–10.3)
Chloride: 104 mmol/L (ref 98–111)
Creatinine, Ser: 0.96 mg/dL (ref 0.44–1.00)
GFR, Estimated: >60 mL/min (ref >60)
Glucose, Bld: 132 mg/dL — ABNORMAL HIGH (ref 70–99)
Potassium: 4 mmol/L (ref 3.5–5.1)
Sodium: 137 mmol/L (ref 135–145)

## 2020-12-18 ENCOUNTER — Other Ambulatory Visit (HOSPITAL_COMMUNITY): Payer: Self-pay

## 2020-12-29 ENCOUNTER — Other Ambulatory Visit (HOSPITAL_COMMUNITY): Payer: Self-pay

## 2020-12-29 DIAGNOSIS — G4733 Obstructive sleep apnea (adult) (pediatric): Secondary | ICD-10-CM | POA: Diagnosis not present

## 2020-12-29 MED FILL — Dapagliflozin Propanediol Tab 10 MG (Base Equivalent): ORAL | 30 days supply | Qty: 30 | Fill #0 | Status: AC

## 2020-12-29 MED FILL — Valacyclovir HCl Tab 1 GM: ORAL | 90 days supply | Qty: 90 | Fill #0 | Status: AC

## 2020-12-29 MED FILL — Sacubitril-Valsartan Tab 49-51 MG: ORAL | 30 days supply | Qty: 60 | Fill #0 | Status: AC

## 2020-12-29 MED FILL — Fluoxetine HCl Cap 20 MG: ORAL | 30 days supply | Qty: 30 | Fill #0 | Status: AC

## 2020-12-29 MED FILL — Potassium Chloride Microencapsulated Crys ER Tab 20 mEq: ORAL | 90 days supply | Qty: 90 | Fill #0 | Status: AC

## 2020-12-29 MED FILL — Furosemide Tab 20 MG: ORAL | 30 days supply | Qty: 30 | Fill #0 | Status: AC

## 2020-12-29 MED FILL — Ivabradine HCl Tab 5 MG (Base Equiv): ORAL | 30 days supply | Qty: 30 | Fill #0 | Status: AC

## 2020-12-30 ENCOUNTER — Encounter (HOSPITAL_COMMUNITY): Payer: Self-pay

## 2020-12-30 ENCOUNTER — Ambulatory Visit (HOSPITAL_COMMUNITY)
Admission: RE | Admit: 2020-12-30 | Discharge: 2020-12-30 | Disposition: A | Discharge status: Home / Self Care | Payer: BC Managed Care – PPO | Source: Ambulatory Visit | Attending: Cardiology | Admitting: Cardiology

## 2020-12-30 ENCOUNTER — Other Ambulatory Visit: Payer: Self-pay

## 2020-12-30 VITALS — BP 136/88 | HR 90 | Wt 261.2 lb

## 2020-12-30 DIAGNOSIS — Z7984 Long term (current) use of oral hypoglycemic drugs: Secondary | ICD-10-CM | POA: Diagnosis not present

## 2020-12-30 DIAGNOSIS — I110 Hypertensive heart disease with heart failure: Secondary | ICD-10-CM | POA: Insufficient documentation

## 2020-12-30 DIAGNOSIS — J45909 Unspecified asthma, uncomplicated: Secondary | ICD-10-CM | POA: Insufficient documentation

## 2020-12-30 DIAGNOSIS — Z7982 Long term (current) use of aspirin: Secondary | ICD-10-CM | POA: Diagnosis not present

## 2020-12-30 DIAGNOSIS — Z79899 Other long term (current) drug therapy: Secondary | ICD-10-CM | POA: Diagnosis not present

## 2020-12-30 DIAGNOSIS — G4733 Obstructive sleep apnea (adult) (pediatric): Secondary | ICD-10-CM | POA: Insufficient documentation

## 2020-12-30 DIAGNOSIS — I5042 Chronic combined systolic (congestive) and diastolic (congestive) heart failure: Secondary | ICD-10-CM | POA: Insufficient documentation

## 2020-12-30 DIAGNOSIS — Z8249 Family history of ischemic heart disease and other diseases of the circulatory system: Secondary | ICD-10-CM | POA: Diagnosis not present

## 2020-12-30 DIAGNOSIS — I5082 Biventricular heart failure: Secondary | ICD-10-CM | POA: Insufficient documentation

## 2020-12-30 DIAGNOSIS — E119 Type 2 diabetes mellitus without complications: Secondary | ICD-10-CM | POA: Insufficient documentation

## 2020-12-30 DIAGNOSIS — Z6839 Body mass index (BMI) 39.0-39.9, adult: Secondary | ICD-10-CM | POA: Diagnosis not present

## 2020-12-30 LAB — BASIC METABOLIC PANEL
Anion gap: 9 (ref 5–15)
BUN: 7 mg/dL (ref 6–20)
CO2: 26 mmol/L (ref 22–32)
Calcium: 8.9 mg/dL (ref 8.9–10.3)
Chloride: 101 mmol/L (ref 98–111)
Creatinine, Ser: 0.81 mg/dL (ref 0.44–1.00)
GFR, Estimated: >60 mL/min (ref >60)
Glucose, Bld: 117 mg/dL — ABNORMAL HIGH (ref 70–99)
Potassium: 3.6 mmol/L (ref 3.5–5.1)
Sodium: 136 mmol/L (ref 135–145)

## 2020-12-30 NOTE — Progress Notes (Signed)
[facility] Note   PCP: Donato Schultz, DO PCP-Cardiologist: Chilton Si, MD  HF Cardiologist: Dr. Gala Romney, MD   Reason for Visit: F/u for chronic systolic heart failure   HPI:  [name] is a 40 year old female with a history of obesity, DM, HTN, asthma, OSA and severe systolic HF.  Echo 2010  EF 55-60%  Admitted 12/21 at [redacted] weeks pregnant, with first baby, with hypertension and lower extremity edema and found to have acute systolic heart failure. Echo completed and showed biventricular dysfunction with EF 15-20%. CO-OX consistent with cardiogenic shock.  Diuresed over 50 pounds and GDMT initiated   Has done fairly well postpartum. Had recent f/u w/ Dr. Gala Romney 11/12/20 and was doing well w/o symptoms. Echo was repeated. EF still low but mildly improved, up to 30% w/ mild-mod RV dysfunction. Digoxin was discontinued and Farxiga 10 mg daily was added.   On 3/10, pt contacted clinic w/ reports of increased dyspnea and 3 lb wt gain. She was advised to increase lasix to 40 mg daily x 2 days.   I saw her back on return f/u on 3/15. Wt was down 2 lb from previous visit but still up 3 lb, based on her home scale. BP 130/90. Pulse rate 74 bpm. She continued w/ exertional dyspnea and fatigue. She was tolerating [place] ok w/o SE. No GU symptoms. I increased Cleda Daub to 25 mg daily and increased Entresto to 49-51 mg bid. She had f/u BMP 1 week post med titration and both SCr and K were stable.   She returns now for f/u. Admits to being off track w/ meds and w/ use of CPAP. Has been struggling juggling being new mom + work. Ran out of her meds 1 week ago and has not been to pharmacy for refills yet but plans to pick them up today. Also not wearing CPAP at night. Wt is up 10 lb from previous visit, but she reports stable symptoms, still NYHA Class II-III, no issues walking on flat surfaces but SOB walking up stairs. No orthopnea/PND. Frustrated about her weight, she is  asking about diet pills.    Note: Baby Girls' name is [name]    Echo 08/24/20: Biventricular HF EF < 20%  Echo 2/22: EF 30% mild to mod RV dysfunction    FH: Dad has A FIb, Aunt heart failure.  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    Past Medical History:  Diagnosis Date  . Anemia   . Asthma   . Genital herpes   . Gestational diabetes   . Obesity   . Pre-diabetes   . Thyroid disease     Current Outpatient Medications  Medication Sig Dispense Refill  . albuterol (PROAIR HFA) 108 (90 Base) MCG/ACT inhaler Inhale 2 puffs into the lungs every 6 (six) hours as needed. 6.7 g 1  . aspirin 81 MG chewable tablet Chew 81 mg by mouth daily.    . calcium carbonate (TUMS - DOSED IN MG ELEMENTAL CALCIUM) 500 MG chewable tablet Chew 1-2 tablets by mouth daily as needed for indigestion or heartburn.    . carvedilol (COREG) 3.125 MG tablet TAKE 1 TABLET (3.125 MG TOTAL) BY MOUTH TWO TIMES DAILY WITH A MEAL. 60 tablet 5  . COVID-19 mRNA vaccine, Moderna, 100 MCG/0.5ML injection INJECT AS DIRECTED .25 mL 0  . dapagliflozin propanediol (FARXIGA) 10 MG TABS tablet TAKE 1 TABLET (10 MG TOTAL) BY MOUTH DAILY BEFORE BREAKFAST.  30 tablet 11  . diphenhydramine-acetaminophen (TYLENOL PM) 25-500 MG TABS tablet Take 1 tablet by mouth at bedtime as needed (sleep).    [name] FLUoxetine (PROZAC) 20 MG capsule TAKE 1 CAPSULE (20 MG TOTAL) BY MOUTH DAILY. 30 capsule 2  . furosemide (LASIX) 20 MG tablet TAKE 1 TABLET (20 MG TOTAL) BY MOUTH DAILY. 30 tablet 5  . glucose blood test strip USE 1 STRIP TO CHECK BLOOD GLUCOSE FOUR TIMES DAILY. (FASTING AND TWO HOURS AFTER MEALS) (Patient taking differently: USE 1 STRIP TO CHECK BLOOD GLUCOSE FOUR TIMES DAILY. (FASTING AND TWO HOURS AFTER MEALS)) 100 strip 3  . hydrALAZINE (APRESOLINE) 50 MG tablet TAKE 0.5 TABLETS (25 MG TOTAL) BY MOUTH EVERY 8 (EIGHT) HOURS. 45 tablet 6  . isosorbide dinitrate (ISORDIL) 10 MG tablet TAKE 1 TABLET (10 MG  TOTAL) BY MOUTH THREE TIMES DAILY. 90 tablet 5  . ivabradine (CORLANOR) 5 MG TABS tablet TAKE 1/2 TABLET BY MOUTH TWICE DAILY WITH MEALS 30 tablet 3  . labetalol (NORMODYNE) 100 MG tablet TAKE 1 TABLET BY MOUTH EVERY 8 HOURS 90 tablet 3  . potassium chloride SA (KLOR-CON) 20 MEQ tablet TAKE 1 TABLET (20 MEQ TOTAL) BY MOUTH DAILY. 90 tablet 3  . Prenatal Vit-Fe Fumarate-FA (PRENATAL MULTIVITAMIN) TABS tablet Take 1 tablet by mouth daily at 12 noon.    . sacubitril-valsartan (ENTRESTO) 49-51 MG TAKE 1 TABLET BY MOUTH 2 (TWO) TIMES DAILY. 60 tablet 11  . senna-docusate (SENOKOT-S) 8.6-50 MG tablet TAKE 2 TABLETS BY MOUTH TWO TIMES DAILY. 60 tablet 6  . spironolactone (ALDACTONE) 25 MG tablet TAKE 1 TABLET (25 MG TOTAL) BY MOUTH DAILY. 30 tablet 11  . valACYclovir (VALTREX) 1000 MG tablet Take 1 tablet (1,000 mg total) by mouth daily. 90 tablet 3  . ergocalciferol (VITAMIN D2) 1.25 MG (50000 UT) capsule Take 1,250 mcg by mouth daily. (Patient not taking: Reported on 12/30/2020)    . oxyCODONE-acetaminophen (PERCOCET/ROXICET) 5-325 MG tablet TAKE 1-2 TABLETS EVERY 4-6 HOURS AS NEEDED FOR SEVERE PAIN (Patient not taking: Reported on 12/30/2020) 30 tablet 0   No current facility-administered medications for this encounter.    Allergies  Allergen Reactions  . Clomiphene Hives    Hives and lips swelling.     Social History   Socioeconomic History  . Marital status: Married    Spouse name: Not on file  . Number of children: Not on file  . Years of education: Not on file  . Highest education level: Not on file  Occupational History  . 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 Smoking status: Never Smoker  . Smokeless tobacco: Never Used  Vaping Use  . Vaping Use: Never used  Substance and Sexual Activity  . Alcohol use: Yes    Comment: SOCIAL   . Drug use: No  . Sexual activity: Yes    Partners: Male    Birth control/protection: None    Comment: 1st  intercourse- 54, partners- 10, current partner- 4 yrs   Other Topics Concern  . Not on file  Social History Narrative  . Not on file   Social Determinants of Health   Financial Resource Strain: Not on file  Food Insecurity: No Food Insecurity  . Worried About Programme researcher, broadcasting/film/video in the Last Year: Never true  . Ran Out of Food in the Last Year: Never true  Transportation Needs: Not on file  Physical Activity: Not on file  Stress: Not on file  Social Connections: Not on file  Intimate Partner Violence: Not on file     Family History  Problem Relation Age of Onset  . Hyperlipidemia Mother   . Hypertension Mother   . Depression Father   . Hyperlipidemia Father   . Heart disease Father 66       MI  . Hypertension Father   . Cancer Father 44       prostate  . Cancer Paternal Grandfather        prostate  . Coronary artery disease Other   . Prostate cancer Other   . Cancer Maternal Grandfather        prostate    Vitals:   12/30/20 1104  BP: 136/88  Pulse: 90  SpO2: 97%  Weight: 118.5 kg (261 lb 3.2 oz)    Wt Readings from Last 3 Encounters:  12/30/20 118.5 kg (261 lb 3.2 oz)  11/30/20 114.1 kg (251 lb 9.6 oz)  11/12/20 115 kg (253 lb 9.6 oz)    PHYSICAL EXAM: General:  Well appearing, obese. No respiratory difficulty HEENT: normal Neck: supple. no JVD. Carotids 2+ bilat; no bruits. No lymphadenopathy or thyromegaly appreciated. Cor: PMI nondisplaced. Regular rate & rhythm. No rubs, gallops or murmurs. Lungs: clear, no wheezing  Abdomen: soft, nontender, nondistended. No hepatosplenomegaly. No bruits or masses. Good bowel sounds. Extremities: no cyanosis, clubbing, rash, edema Neuro: alert & oriented x 3, cranial nerves grossly intact. moves all 4 extremities w/o difficulty. Affect pleasant.      ASSESSMENT & PLAN 1. Chronic Biventricular Heart Failure - Echo 12/21 severely reduced EF 15-20% . Possible HTN CM. - C-section 12/11 without complications.  -  Repeat Echo: 11/12/20: EF 30% mild to mod RV dysfunction  - NYHA II-early III. Does not appear significantly fluid overloaded on exam but wt is up 10 lb, ran out of meds 1 week ago.  - stressed the importance of strict daily compliance w/ meds + CPAP. She will pick meds up from pharmacy today. Given recent increase in meds pervious visit and SBP at 130 w/o meds, will not titrate today. Will plan to pick back up on regimen today and will f/u w/ RN phone call on Monday for BP check (using home monitor) and assessment of med tolerance. If SBP > 110 and no dizziness, will plan to titrate Entresto to 97-103 bid w/ f/u BMP 1 week later and pharmD visit in 3-4 weeks. If pulse rate > 70, increase Corlanor to 7.5 mg bid.  - Continue carvedilol 3.125 mg bid.  - Continue hydralazine 25 tid/isordil 10 tid - Continue Entresto to 49-51 mg bid.  - Continue spiro 25 mg daily. - Continue ivabradine 5 mg daily  - Continue Farxiga 10 mg daily  - Continue Lasix 20 daily (she will notify us if she loses wt too quickly, may need to scale back) - Check BMP today  - Discussed reliable birth control & avoidance of future pregnancy.   2. Sinus tachycardia - Resolved. HR 90s (out of corlanor x 7 days) - c/w ivabradine  3. HTN - controlled on current regimen (see above)   4. DM2 - a1c 5.4 (12/21) - Continue metformin + Lantus - Per PCP. - now on Farxiga   5. OSA - Recently diagnosed.  - Encouraged to improve nightly compliance w/ CPAP  6. Obesity - Body mass index is 39.72 kg/m. - post-partum  - she is frustrated regarding wt. I advised against the use of diet supp given her cardiac history  and cardiac meds - I offered referral to health/y wt management clinic but she declined at this time, will try lifestyle modification on her on to start   F/u in 4 weeks w/ PharmD. F/u in 8 weeks w/ Dr. Aquilla Solian, PA-C 12/30/20

## 2020-12-30 NOTE — Patient Instructions (Signed)
Please restart all medications  Your physician has requested that you regularly monitor and record your blood pressure readings at home. Please use the same machine at the same time of day to check your readings and record them to bring to your follow-up visit. -Triage will follow up with you on Monday 01/03/2021. Please have b/p reading available   Labs today We will only contact you if something comes back abnormal or we need to make some changes. Otherwise no news is good news!  Your physician recommends that you schedule a follow-up appointment in: 4 weeks with the pharmacy team and 8 weeks with Dr Gala Romney  Do the following things EVERYDAY: 1) Weigh yourself in the morning before breakfast. Write it down and keep it in a log. 2) Take your medicines as prescribed 3) Eat low salt foods--Limit salt (sodium) to 2000 mg per day.  4) Stay as active as you can everyday 5) Limit all fluids for the day to less than 2 liters If you have any questions or concerns before your next appointment please send [place] a message through [place] or call our office at [phone].    TO LEAVE A MESSAGE FOR THE NURSE SELECT OPTION 2, PLEASE LEAVE A MESSAGE INCLUDING: . YOUR NAME . DATE OF BIRTH . CALL BACK NUMBER . REASON FOR CALL**this is important as we prioritize the call backs  YOU WILL RECEIVE A CALL BACK THE SAME DAY AS LONG AS YOU CALL BEFORE 4:00 PM

## 2021-01-03 ENCOUNTER — Telehealth (HOSPITAL_COMMUNITY): Payer: Self-pay | Admitting: *Deleted

## 2021-01-03 ENCOUNTER — Encounter (HOSPITAL_COMMUNITY): Payer: BC Managed Care – PPO

## 2021-01-03 NOTE — Telephone Encounter (Signed)
Called pt she didn't start taking all medications until Sunday. BP this AM 109/74. Will call patient Thursday for bp readings and schedule lab appt for Monday.

## 2021-01-03 NOTE — Telephone Encounter (Signed)
-----   Message from [facility] [place] sent at 12/30/2020 11:31 AM EDT ----- Regarding: Call Monday 4/18 This patient restarted all medications today and will check b/p over the weekend. Can you   do triage call on Monday to see how she is doing and  review b/p readings? If b/p is still elevated she can increase entresto to 97/103. She will then need a repeat bmet 7-10 days from Mondays date per Brittainy,PA   Thanks

## 2021-01-09 ENCOUNTER — Encounter (HOSPITAL_COMMUNITY): Payer: Self-pay

## 2021-01-10 ENCOUNTER — Encounter (HOSPITAL_COMMUNITY): Payer: BC Managed Care – PPO

## 2021-01-26 ENCOUNTER — Other Ambulatory Visit (HOSPITAL_COMMUNITY): Payer: Self-pay | Admitting: Adult Health

## 2021-01-26 ENCOUNTER — Other Ambulatory Visit (HOSPITAL_COMMUNITY): Payer: Self-pay

## 2021-01-26 MED FILL — Dapagliflozin Propanediol Tab 10 MG (Base Equivalent): ORAL | 30 days supply | Qty: 30 | Fill #1 | Status: AC

## 2021-01-26 MED FILL — Sacubitril-Valsartan Tab 49-51 MG: ORAL | 30 days supply | Qty: 60 | Fill #1 | Status: AC

## 2021-01-26 MED FILL — Spironolactone Tab 25 MG: ORAL | 30 days supply | Qty: 30 | Fill #0 | Status: AC

## 2021-01-26 MED FILL — Furosemide Tab 20 MG: ORAL | 30 days supply | Qty: 30 | Fill #1 | Status: AC

## 2021-01-26 MED FILL — Ivabradine HCl Tab 5 MG (Base Equiv): ORAL | 30 days supply | Qty: 30 | Fill #1 | Status: AC

## 2021-01-27 ENCOUNTER — Other Ambulatory Visit (HOSPITAL_COMMUNITY): Payer: Self-pay

## 2021-01-28 DIAGNOSIS — G4733 Obstructive sleep apnea (adult) (pediatric): Secondary | ICD-10-CM | POA: Diagnosis not present

## 2021-02-07 ENCOUNTER — Other Ambulatory Visit (HOSPITAL_COMMUNITY): Payer: BC Managed Care – PPO

## 2021-02-10 ENCOUNTER — Other Ambulatory Visit: Payer: Self-pay

## 2021-02-10 ENCOUNTER — Ambulatory Visit (HOSPITAL_COMMUNITY)
Admission: RE | Admit: 2021-02-10 | Discharge: 2021-02-10 | Disposition: A | Discharge status: Home / Self Care | Payer: BC Managed Care – PPO | Source: Ambulatory Visit | Attending: Internal Medicine | Admitting: Internal Medicine

## 2021-02-10 ENCOUNTER — Encounter (HOSPITAL_COMMUNITY): Payer: Self-pay

## 2021-02-10 ENCOUNTER — Other Ambulatory Visit (HOSPITAL_COMMUNITY): Payer: Self-pay

## 2021-02-10 VITALS — BP 122/86 | HR 83 | Wt 266.2 lb

## 2021-02-10 DIAGNOSIS — G4733 Obstructive sleep apnea (adult) (pediatric): Secondary | ICD-10-CM | POA: Diagnosis not present

## 2021-02-10 DIAGNOSIS — I110 Hypertensive heart disease with heart failure: Secondary | ICD-10-CM | POA: Diagnosis not present

## 2021-02-10 DIAGNOSIS — Z7901 Long term (current) use of anticoagulants: Secondary | ICD-10-CM | POA: Insufficient documentation

## 2021-02-10 DIAGNOSIS — I5082 Biventricular heart failure: Secondary | ICD-10-CM | POA: Diagnosis not present

## 2021-02-10 DIAGNOSIS — R0602 Shortness of breath: Secondary | ICD-10-CM | POA: Insufficient documentation

## 2021-02-10 DIAGNOSIS — Z79899 Other long term (current) drug therapy: Secondary | ICD-10-CM | POA: Insufficient documentation

## 2021-02-10 DIAGNOSIS — Z6839 Body mass index (BMI) 39.0-39.9, adult: Secondary | ICD-10-CM | POA: Insufficient documentation

## 2021-02-10 DIAGNOSIS — Z7984 Long term (current) use of oral hypoglycemic drugs: Secondary | ICD-10-CM | POA: Diagnosis not present

## 2021-02-10 DIAGNOSIS — E669 Obesity, unspecified: Secondary | ICD-10-CM | POA: Insufficient documentation

## 2021-02-10 DIAGNOSIS — I5020 Unspecified systolic (congestive) heart failure: Secondary | ICD-10-CM | POA: Diagnosis not present

## 2021-02-10 DIAGNOSIS — E118 Type 2 diabetes mellitus with unspecified complications: Secondary | ICD-10-CM | POA: Insufficient documentation

## 2021-02-10 DIAGNOSIS — Z8616 Personal history of COVID-19: Secondary | ICD-10-CM | POA: Diagnosis not present

## 2021-02-10 DIAGNOSIS — I5041 Acute combined systolic (congestive) and diastolic (congestive) heart failure: Secondary | ICD-10-CM

## 2021-02-10 MED ORDER — ENTRESTO 97-103 MG PO TABS
1.0000 | ORAL_TABLET | Freq: Two times a day (BID) | ORAL | 11 refills | Status: DC
  Filled 2021-02-10 – 2021-03-09 (×2): qty 60, 30d supply, fill #0
  Filled 2021-05-27: qty 60, 30d supply, fill #1
  Filled 2021-09-14: qty 60, 30d supply, fill #2

## 2021-02-10 MED ORDER — IVABRADINE HCL 5 MG PO TABS
5.0000 mg | ORAL_TABLET | Freq: Two times a day (BID) | ORAL | 11 refills | Status: DC
  Filled 2021-02-10: qty 60, 30d supply, fill #0

## 2021-02-10 NOTE — Progress Notes (Signed)
PCP: Donato Schultz, DO PCP-Cardiologist: Chilton Si, MD  HF Cardiologist: Dr. Gala Romney, MD  HPI:  [name] is a 40 year old female with a history of obesity,DM, HTN, asthma,OSA and severe systolic HF.  Echo in 2010 with normal EF55-60%  Was admitted 08/2020 at [redacted] weeks pregnant with first baby. Had hypertension and lower extremity edema and found to have acute systolic heart failure. Echo completed and showed biventricular dysfunction with EF 15-20%. CO-OX consistent with cardiogenic shock. Diuresed over 50 pounds and GDMT initiated.   Has done fairly well postpartum. Had recent follow-up w/ Dr. Gala Romney on 11/12/20 and was doing well w/o symptoms. Echo was repeated. EF was still low but mildly improved, up to 30% w/ mild-mod RV dysfunction. Digoxin was discontinued and Farxiga 10 mg daily was added.   On 11/25/20, she contacted the clinic w/ reports of increased dyspnea and 3 lb wt gain. She was advised to increase furosemide to 40 mg daily for 2 days.   She returned for follow-up on 11/30/20 with Robbie Lis, PA-C. Weight was down 2 lbs from previous visit but still up 3 lbs based on her home scale. She continued to have exertional dyspnea and fatigue. She was tolerating [place] well. Spironolactone was increased to 25 mg and Entresto increased to 49/51 mg BID at this visit.    She returned for follow-up on 12/30/20 with Robbie Lis, PA-C. She admitted to being off track w/ medications and had been struggling juggling being a new mom and work. She ran out of her medications 1 week prior and had not been to the pharmacy for refills. Also was not wearing CPAP at night. Wt was up 10 lbs from previous visit, but she reported stable symptoms, still NYHA Class II-III. Did not have issues walking on flat surfaces but was SOB walking up stairs. No orthopnea/PND. Was frustrated about her weight, she was asking about diet pills.  Today she returns to HF clinic for  pharmacist medication titration. At last visit with APP, patient was supposed to restart all HF medications after picking up refills from pharmacy. Plan was to call patient 1 week after restarting medications and to increase Entresto to 96/103 mg BID if SBP >110 and to increase Corlanor if HR >70. However, during the follow up call, she had only picked up her medications the day prior and no medication changes were made. An additional follow up was documented to be arranged later that week but this does not look like it was completed.   She is overall feeling fine. Was recently diagnosed with COVID on 01/09/21 using a home test. Has been taking her medications since picking them up from the pharmacy. Has not measured BP at home. No dizziness, lightheadedness, chest pain, or palpitations. Continues to have fatigue but thinks this is due to baby waking up every 2 hours at night. Is also not wearing CPAP at night. Breathing is good, able to climb a flight of stairs before getting SOB. Weight up 5 lbs since last visit. She does not think this is due to increase in volume status but rather due to her appetite increasing and not being physically active due to her fatigue. No LEE, PND, orthopnea. She states that she wants to have another baby.   HF Medications: Carvedilol 3.125 mg BID  Entresto 49/51 mg BID  Spironolactone 25 mg daily Farxiga 10 mg daily Hydralazine 25 mg TID Isosorbide dinitrate 10 mg TID Corlanor 2.5 mg BID Furosemide 20 mg daily  KCl 20 mEq daily  Has the patient been experiencing any side effects to the medications prescribed?  no  Does the patient have any problems obtaining medications due to transportation or finances?   no - Has [employer]. Uses Entresto and Corlanor copay cards.  Understanding of regimen: fair Understanding of indications: fair Potential of compliance: fair Patient understands to avoid NSAIDs. Patient understands to avoid decongestants.     Pertinent Lab Values: . 12/30/20: Serum creatinine 0.81, BUN 7, Potassium 3.6, Sodium 136   Vital Signs: . Weight: 266.2 lbs (last clinic weight: 261 lbs) . Blood pressure: 122/86 mmHg . Heart rate: 83 bpm  Assessment/Plan: 1. Chronic Biventricular Heart Failure - Echo 12/21 severely reduced EF 15-20%. Possible HTN CM. - C-section 12/11 without complications. - Repeat Echo on 11/12/20: EF 30% mild to mod RV dysfunction  -NYHA II-early III. Does not appear fluid overloaded on exam but weight is up 5 lbs since last visit. - Continue furosemide 20 mg daily.  - Continue KCl 20 mEq daily - Continue carvedilol 3.125 mg BID  - Increase Entresto to 97/103 mg BID - Continue spironolactone 25 mg daily - Continue Farxiga 10 mg daily - Continue hydralazine 25 mg TID - Continue isosorbide dinitrate 10 mg TID - Increase Corlanor to 5 mg BID - Follow-up in 3 weeks with APP clinic and in 2 months with Dr. Gala Romney - Discussed reliable birth control & avoidance of future pregnancy.  2. Sinus tachycardia - Resolved.  - Increase Corlanor and continue carvedilol as above  3. HTN - Diastolic BP slightly elevated.  - Increase Entresto as above - Continue carvedilol, hydralazine, spironolactone, isosorbide dinitrate as above  4. DM2 - a1c 5.4 (12/21). Per PCP. - Continue metformin, Lantus, and Farxiga  5. OSA - Recently diagnosed.  - Previously encouraged to improve nightly compliance w/ CPAP  6. Obesity - Body mass index is 39.72 kg/m. - Post-partum  - She has been frustrated with weight. Advised against the use of diet supplements given her cardiac history and cardiac medications - Noted previously offered referral to health/y wt management clinic but she declined  Tama Headings, PharmD, BCPS PGY2 Cardiology Pharmacy Resident  Sharen Hones, PharmD, BCPS [facility] Pharmacist [phone]

## 2021-02-10 NOTE — Patient Instructions (Addendum)
It was a pleasure seeing you today!  MEDICATIONS: -We are changing your medications today -Increase Entresto to 97/103 mg twice daily -Increase Corlanor to 5 mg twice daily -Call if you have questions about your medications.  NEXT APPOINTMENT: Return to clinic in 3-4 weeks with [facility] Clinic.  In general, to take care of your heart failure: -Limit your fluid intake to 2 Liters (half-gallon) per day.   -Limit your salt intake to ideally 2-3 grams (2000-3000 mg) per day. -Weigh yourself daily and record, and bring that "weight diary" to your next appointment.  (Weight gain of 2-3 pounds in 1 day typically means fluid weight.) -The medications for your heart are to help your heart and help you live longer.   -Please contact us before stopping any of your heart medications.  Call the clinic at [phone] with questions or to reschedule future appointments.

## 2021-02-18 ENCOUNTER — Other Ambulatory Visit (HOSPITAL_COMMUNITY): Payer: Self-pay

## 2021-03-07 NOTE — Progress Notes (Addendum)
Advanced [facility] Failure Clinic Note   PCP: Donato Schultz, DO PCP-Cardiologist: Chilton Si, MD  HF Cardiologist: Dr. Gala Romney, MD  Reason for Visit: F/u for chronic systolic heart failure   HPI:  [name] is a 40 year old female with a history of obesity, DM, HTN, asthma, OSA and severe systolic HF.  Echo 2010  EF 55-60%   Admitted 12/21 at [redacted] weeks pregnant, with first baby, with hypertension and lower extremity edema and found to have acute systolic heart failure. Echo completed and showed biventricular dysfunction with EF 15-20%. CO-OX consistent with cardiogenic shock.  Diuresed over 50 pounds and GDMT initiated   Has done fairly well postpartum. Had recent f/u w/ Dr. Gala Romney 11/12/20 and was doing well w/o symptoms. Echo was repeated. EF still low but mildly improved, up to 30% w/ mild-mod RV dysfunction. Digoxin was discontinued and Farxiga 10 mg daily was added.   On 3/10, pt contacted clinic w/ reports of increased dyspnea and 3 lb wt gain. She was advised to increase lasix to 40 mg daily x 2 days.   She returned f/u on 3/15. NYHA II-early IIIb symptoms.  She was tolerating [place] ok w/o SE. No GU symptoms. Cleda Daub and Sherryll Burger increased.   Follow-up on 12/30/20. She ran out of her meds 1 week prior. Not wearing CPAP. Wt was up 10 lbs from previous visit. Asking about diet pills. She was COVID+ 01/09/21.   Today she returns for HF follow up. Overall feeling fatigued which she says is due to waking with her baby every 2 hours at night. Currently has a yeast infection, has had 3 in last 6 months, now has IUD. Denies increasing SOB, CP, dizziness, edema, or PND/Orthopnea. Appetite ok. No fever or chills. Weight at home ~268 pounds. Taking all medications, but has missed 1 day this past week. Back to working FT. Not wearing CPAP, waking with baby every 2 hours at night, partner works 3rd shift. Frustrated with weight gain.  Note: Baby Girls' name is [name]   Echo  08/24/20: Biventricular HF EF < 20%  Echo 2/22: EF 30% mild to mod RV dysfunction     FH: Dad has A FIb, Aunt heart failure.  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   Past Medical History:  Diagnosis Date   Anemia    Asthma    Genital herpes    Gestational diabetes    Obesity    Pre-diabetes    Thyroid disease    Current Outpatient Medications  Medication Sig Dispense Refill   albuterol (PROAIR HFA) 108 (90 Base) MCG/ACT inhaler Inhale 2 puffs into the lungs every 6 (six) hours as needed. 6.7 g 1   aspirin 81 MG chewable tablet Chew 81 mg by mouth daily.     calcium carbonate (TUMS - DOSED IN MG ELEMENTAL CALCIUM) 500 MG chewable tablet Chew 1-2 tablets by mouth daily as needed for indigestion or heartburn.     carvedilol (COREG) 3.125 MG tablet TAKE 1 TABLET (3.125 MG TOTAL) BY MOUTH TWO TIMES DAILY WITH A MEAL. 60 tablet 5   dapagliflozin propanediol (FARXIGA) 10 MG TABS tablet TAKE 1 TABLET (10 MG TOTAL) BY MOUTH DAILY BEFORE BREAKFAST. 30 tablet 11   diphenhydramine-acetaminophen (TYLENOL PM) 25-500 MG TABS tablet Take 1 tablet by mouth at bedtime as needed (sleep).     ergocalciferol (VITAMIN D2) 1.25 MG (50000 UT) capsule Take 1,250 mcg by mouth daily.     FLUoxetine (  PROZAC) 20 MG capsule TAKE 1 CAPSULE (20 MG TOTAL) BY MOUTH DAILY. 30 capsule 2   furosemide (LASIX) 20 MG tablet TAKE 1 TABLET (20 MG TOTAL) BY MOUTH DAILY. 30 tablet 5   glucose blood test strip USE 1 STRIP TO CHECK BLOOD GLUCOSE FOUR TIMES DAILY. (FASTING AND TWO HOURS AFTER MEALS) (Patient taking differently: USE 1 STRIP TO CHECK BLOOD GLUCOSE FOUR TIMES DAILY. (FASTING AND TWO HOURS AFTER MEALS)) 100 strip 3   hydrALAZINE (APRESOLINE) 50 MG tablet TAKE 0.5 TABLETS (25 MG TOTAL) BY MOUTH EVERY 8 (EIGHT) HOURS. 45 tablet 6   isosorbide dinitrate (ISORDIL) 10 MG tablet TAKE 1 TABLET (10 MG TOTAL) BY MOUTH THREE TIMES DAILY. 90 tablet 5   ivabradine (CORLANOR) 5 MG TABS tablet Take 1  tablet (5 mg total) by mouth 2 (two) times daily with a meal. 60 tablet 11   potassium chloride SA (KLOR-CON) 20 MEQ tablet TAKE 1 TABLET (20 MEQ TOTAL) BY MOUTH DAILY. 90 tablet 3   sacubitril-valsartan (ENTRESTO) 97-103 MG Take 1 tablet by mouth 2 (two) times daily. 60 tablet 11   spironolactone (ALDACTONE) 25 MG tablet TAKE 1 TABLET (25 MG TOTAL) BY MOUTH DAILY. 30 tablet 11   valACYclovir (VALTREX) 1000 MG tablet Take 1 tablet (1,000 mg total) by mouth daily. 90 tablet 3   COVID-19 mRNA vaccine, Moderna, 100 MCG/0.5ML injection INJECT AS DIRECTED .25 mL 0   No current facility-administered medications for this encounter.   Allergies  Allergen Reactions   Clomiphene Hives    Hives and lips swelling.    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    Employer: [employer]  Tobacco Use   Smoking status: Never   Smokeless tobacco: Never  Vaping Use   Vaping Use: Never used  Substance and Sexual Activity   Alcohol use: Yes    Comment: SOCIAL    Drug use: No   Sexual activity: Yes    Partners: Male    Birth control/protection: None    Comment: 1st intercourse- 16, partners- 10, current partner- 4 yrs   Other Topics Concern   Not on file  Social History Narrative   Not on file   Social Determinants of Health   Financial Resource Strain: Not on file  Food Insecurity: No Food Insecurity   Worried About Programme researcher, broadcasting/film/video in the Last Year: Never true   Barista in the Last Year: Never true  Transportation Needs: Not on file  Physical Activity: Not on file  Stress: Not on file  Social Connections: Not on file  Intimate Partner Violence: Not on file     Family History  Problem Relation Age of Onset   Hyperlipidemia Mother    Hypertension Mother     Depression Father    Hyperlipidemia Father    Heart disease Father 73       MI   Hypertension Father    Cancer Father 70       prostate   Cancer Paternal Grandfather        prostate   Coronary artery disease Other    Prostate cancer Other    Cancer Maternal Grandfather        prostate  BP 110/70   Pulse 95   Wt 122.4 kg (269 lb 12.8 oz)   SpO2 98%   BMI 41.02 kg/m   Wt Readings from Last 3 Encounters:  03/08/21 122.4 kg (269 lb 12.8 oz)  02/10/21 120.7 kg (266 lb 3.2 oz)  12/30/20 118.5 kg (261 lb 3.2 oz)   PHYSICAL EXAM: General:  NAD. No resp difficulty HEENT: Normal Neck: Supple. Thick neck, difficult to assess JVD. Carotids 2+ bilat; no bruits. No lymphadenopathy or thryomegaly appreciated. Cor: PMI nondisplaced. Regular rate & rhythm. No rubs, gallops or murmurs. Lungs: Clear Abdomen: Obese, soft, nontender, nondistended. No hepatosplenomegaly. No bruits or masses. Good bowel sounds. Extremities: No cyanosis, clubbing, rash, edema Neuro: alert & oriented x 3, cranial nerves grossly intact. Moves all 4 extremities w/o difficulty. Affect pleasant.  ECG: SR 86 bpm, qtc 435 ms (personally reviewed)  Reds: 39%   ASSESSMENT & PLAN 1. Chronic Biventricular Heart Failure - Echo 12/21 severely reduced EF 15-20% . Possible HTN CM. - C-section 12/11 without complications.  - Repeat Echo: 11/12/20: EF 30% mild to mod RV dysfunction  - NYHA II. Does not appear significantly fluid overloaded on exam, but wt continues to rise. - Increase ivabradine to 7.5 mg bid. HR today 95. - Increase lasix to 40 mg daily. - Continue carvedilol 3.125 mg bid.  - Continue hydralazine 25 tid/isordil 10 tid. - Continue Entresto 97/103 mg bid.  - Continue spiro 25 mg daily. - Continue Farxiga 10 mg daily for now. Adamant she does not want to stop [place]. - Discussed reliable birth control & avoidance of future pregnancy. She has an IUD in place. - BMET & BNP today; repeat BMET in 10  days. - Stressed importance to adhere to medication regimen.  2. Sinus tachycardia - Resolved. HR 90s today. - Increase ivabradine.   3. HTN - Controlled on current regimen.    4. DM2 - a1c 5.4 (12/21). - Per PCP. - On Farxiga.    5. OSA - Recently diagnosed.  - Encouraged to wear CPAP nightly, hopeful to improve her fatigue.   6. Obesity - Body mass index is 41.02 kg/m. - 6 months post-partum  - I offered referral to healthy wt management clinic but she declined at this time, will try lifestyle modification. - Been on Saxenda in past, but unable to afford. May be a good candidate for Wegovy/Ozempic, advised follow up with PCP to discuss.  7. Candidasis, vulvovaginal - Fluconazole x 1, QTc today 435 ms. - ideally needs to stop [place], adamant she wants to continue. - I think it's reasonable to have her follow up with her GYN to make sure current infection is not BV, or related to IUD strings.   F/u in 4 weeks w/ Dr. Gala Romney as scheduled.  Kelli Malta [place], FNP 03/08/21

## 2021-03-08 ENCOUNTER — Other Ambulatory Visit (HOSPITAL_COMMUNITY): Payer: Self-pay

## 2021-03-08 ENCOUNTER — Encounter (HOSPITAL_COMMUNITY): Payer: Self-pay

## 2021-03-08 ENCOUNTER — Ambulatory Visit (HOSPITAL_COMMUNITY)
Admission: RE | Admit: 2021-03-08 | Discharge: 2021-03-08 | Disposition: A | Discharge status: Home / Self Care | Payer: BC Managed Care – PPO | Source: Ambulatory Visit | Attending: Family Medicine | Admitting: Family Medicine

## 2021-03-08 ENCOUNTER — Other Ambulatory Visit: Payer: Self-pay

## 2021-03-08 VITALS — BP 110/70 | HR 95 | Wt 269.8 lb

## 2021-03-08 DIAGNOSIS — G4733 Obstructive sleep apnea (adult) (pediatric): Secondary | ICD-10-CM

## 2021-03-08 DIAGNOSIS — I10 Essential (primary) hypertension: Secondary | ICD-10-CM

## 2021-03-08 DIAGNOSIS — Z8616 Personal history of COVID-19: Secondary | ICD-10-CM | POA: Diagnosis not present

## 2021-03-08 DIAGNOSIS — Z8249 Family history of ischemic heart disease and other diseases of the circulatory system: Secondary | ICD-10-CM | POA: Insufficient documentation

## 2021-03-08 DIAGNOSIS — E119 Type 2 diabetes mellitus without complications: Secondary | ICD-10-CM | POA: Insufficient documentation

## 2021-03-08 DIAGNOSIS — R000 Tachycardia, unspecified: Secondary | ICD-10-CM | POA: Insufficient documentation

## 2021-03-08 DIAGNOSIS — B3731 Acute candidiasis of vulva and vagina: Secondary | ICD-10-CM

## 2021-03-08 DIAGNOSIS — Z79899 Other long term (current) drug therapy: Secondary | ICD-10-CM | POA: Insufficient documentation

## 2021-03-08 DIAGNOSIS — E669 Obesity, unspecified: Secondary | ICD-10-CM | POA: Insufficient documentation

## 2021-03-08 DIAGNOSIS — R5383 Other fatigue: Secondary | ICD-10-CM | POA: Insufficient documentation

## 2021-03-08 DIAGNOSIS — Z6841 Body Mass Index (BMI) 40.0 and over, adult: Secondary | ICD-10-CM | POA: Insufficient documentation

## 2021-03-08 DIAGNOSIS — Z7982 Long term (current) use of aspirin: Secondary | ICD-10-CM | POA: Insufficient documentation

## 2021-03-08 DIAGNOSIS — Z975 Presence of (intrauterine) contraceptive device: Secondary | ICD-10-CM | POA: Diagnosis not present

## 2021-03-08 DIAGNOSIS — I5042 Chronic combined systolic (congestive) and diastolic (congestive) heart failure: Secondary | ICD-10-CM

## 2021-03-08 DIAGNOSIS — I110 Hypertensive heart disease with heart failure: Secondary | ICD-10-CM | POA: Diagnosis not present

## 2021-03-08 DIAGNOSIS — Z7984 Long term (current) use of oral hypoglycemic drugs: Secondary | ICD-10-CM | POA: Diagnosis not present

## 2021-03-08 DIAGNOSIS — B379 Candidiasis, unspecified: Secondary | ICD-10-CM | POA: Insufficient documentation

## 2021-03-08 DIAGNOSIS — I5082 Biventricular heart failure: Secondary | ICD-10-CM | POA: Diagnosis not present

## 2021-03-08 DIAGNOSIS — B373 Candidiasis of vulva and vagina: Secondary | ICD-10-CM

## 2021-03-08 LAB — BASIC METABOLIC PANEL
Anion gap: 7 (ref 5–15)
BUN: 7 mg/dL (ref 6–20)
CO2: 26 mmol/L (ref 22–32)
Calcium: 9.2 mg/dL (ref 8.9–10.3)
Chloride: 102 mmol/L (ref 98–111)
Creatinine, Ser: 0.82 mg/dL (ref 0.44–1.00)
GFR, Estimated: >60 mL/min (ref >60)
Glucose, Bld: 110 mg/dL — ABNORMAL HIGH (ref 70–99)
Potassium: 4 mmol/L (ref 3.5–5.1)
Sodium: 135 mmol/L (ref 135–145)

## 2021-03-08 LAB — BRAIN NATRIURETIC PEPTIDE: B Natriuretic Peptide: 16.8 pg/mL (ref 0.0–100.0)

## 2021-03-08 MED ORDER — FLUCONAZOLE 150 MG PO TABS
150.0000 mg | ORAL_TABLET | Freq: Once | ORAL | 0 refills | Status: AC
  Filled 2021-03-08: qty 1, 1d supply, fill #0

## 2021-03-08 MED ORDER — IVABRADINE HCL 7.5 MG PO TABS
7.5000 mg | ORAL_TABLET | Freq: Two times a day (BID) | ORAL | 11 refills | Status: DC
  Filled 2021-03-08: qty 60, 30d supply, fill #0
  Filled 2021-05-27: qty 60, 30d supply, fill #1

## 2021-03-08 MED ORDER — FUROSEMIDE 20 MG PO TABS
40.0000 mg | ORAL_TABLET | Freq: Every day | ORAL | 5 refills | Status: DC
  Filled 2021-03-08: qty 60, 30d supply, fill #0
  Filled 2021-05-27: qty 60, 30d supply, fill #1
  Filled 2021-09-14: qty 60, 30d supply, fill #2
  Filled 2021-12-14: qty 60, 30d supply, fill #3

## 2021-03-08 NOTE — Patient Instructions (Signed)
INCREASE Corlanor to 7.5 mg, one tab twice a day INCREASE Lasix to 40 mg, daily  Labs today We will only contact you if something comes back abnormal or we need to make some changes. Otherwise no news is good news!  Labs needed in 7-10 days  Keep follow up as scheduled  Do the following things EVERYDAY: Weigh yourself in the morning before breakfast. Write it down and keep it in a log. Take your medicines as prescribed Eat low salt foods--Limit salt (sodium) to 2000 mg per day.  Stay as active as you can everyday Limit all fluids for the day to less than 2 liters  At the [facility], you and your health needs are our priority. As part of our continuing mission to provide you with exceptional heart care, we have created designated Provider Care Teams. These Care Teams include your primary Cardiologist (physician) and Advanced Practice Providers (APPs- Physician Assistants and Nurse Practitioners) who all work together to provide you with the care you need, when you need it.   You may see any of the following providers on your designated Care Team at your next follow up: Dr Arvilla Meres Dr Marca Ancona Dr Brandon Melnick, NP Robbie Lis, [place] Mikki Santee Karle Plumber, PharmD   Please be sure to bring in all your medications bottles to every appointment.   If you have any questions or concerns before your next appointment please send [place] a message through [place] or call our office at [phone].    TO LEAVE A MESSAGE FOR THE NURSE SELECT OPTION 2, PLEASE LEAVE A MESSAGE INCLUDING: YOUR NAME DATE OF BIRTH CALL BACK NUMBER REASON FOR CALL**this is important as we prioritize the call backs  YOU WILL RECEIVE A CALL BACK THE SAME DAY AS LONG AS YOU CALL BEFORE 4:00 PM

## 2021-03-08 NOTE — Progress Notes (Signed)
ReDS Vest / Clip - 03/08/21 0900       ReDS Vest / Clip   Station Marker D    Ruler Value 36    ReDS Value Range Moderate volume overload    ReDS Actual Value 39    Anatomical Comments sitting

## 2021-03-09 ENCOUNTER — Other Ambulatory Visit (HOSPITAL_COMMUNITY): Payer: Self-pay

## 2021-03-09 MED FILL — Dapagliflozin Propanediol Tab 10 MG (Base Equivalent): ORAL | 30 days supply | Qty: 30 | Fill #2 | Status: AC

## 2021-03-09 MED FILL — Hydralazine HCl Tab 50 MG: ORAL | 30 days supply | Qty: 45 | Fill #0 | Status: AC

## 2021-03-09 MED FILL — Carvedilol Tab 3.125 MG: ORAL | 30 days supply | Qty: 60 | Fill #0 | Status: AC

## 2021-03-09 MED FILL — Spironolactone Tab 25 MG: ORAL | 30 days supply | Qty: 30 | Fill #1 | Status: AC

## 2021-03-09 MED FILL — Isosorbide Dinitrate Tab 10 MG: ORAL | 30 days supply | Qty: 90 | Fill #0 | Status: AC

## 2021-03-14 ENCOUNTER — Other Ambulatory Visit: Payer: Self-pay

## 2021-03-14 ENCOUNTER — Ambulatory Visit (HOSPITAL_COMMUNITY)
Admission: RE | Admit: 2021-03-14 | Discharge: 2021-03-14 | Disposition: A | Discharge status: Home / Self Care | Payer: BC Managed Care – PPO | Source: Ambulatory Visit | Attending: Internal Medicine | Admitting: Internal Medicine

## 2021-03-14 DIAGNOSIS — I5042 Chronic combined systolic (congestive) and diastolic (congestive) heart failure: Secondary | ICD-10-CM

## 2021-03-14 LAB — BASIC METABOLIC PANEL
Anion gap: 7 (ref 5–15)
BUN: 8 mg/dL (ref 6–20)
CO2: 26 mmol/L (ref 22–32)
Calcium: 8.8 mg/dL — ABNORMAL LOW (ref 8.9–10.3)
Chloride: 104 mmol/L (ref 98–111)
Creatinine, Ser: 0.93 mg/dL (ref 0.44–1.00)
GFR, Estimated: >60 mL/min (ref >60)
Glucose, Bld: 101 mg/dL — ABNORMAL HIGH (ref 70–99)
Potassium: 3.9 mmol/L (ref 3.5–5.1)
Sodium: 137 mmol/L (ref 135–145)

## 2021-03-28 NOTE — Progress Notes (Signed)
Airview download 10/11/20-11/09/20 26/30 days used; 25 days (83%) > 4 hours Average usage days used 6 hours 29 mins Pressure 5-20cm h20 (10.2cm h20- 95%) [place]  10.2L/min (95%) AHI 1.3

## 2021-03-29 ENCOUNTER — Other Ambulatory Visit: Payer: Self-pay

## 2021-03-29 ENCOUNTER — Encounter (HOSPITAL_COMMUNITY): Payer: Self-pay | Admitting: Internal Medicine

## 2021-03-29 ENCOUNTER — Ambulatory Visit (HOSPITAL_COMMUNITY)
Admission: RE | Admit: 2021-03-29 | Discharge: 2021-03-29 | Disposition: A | Discharge status: Home / Self Care | Payer: BC Managed Care – PPO | Source: Ambulatory Visit | Attending: Internal Medicine | Admitting: Internal Medicine

## 2021-03-29 VITALS — BP 102/62 | HR 60 | Wt 275.4 lb

## 2021-03-29 DIAGNOSIS — G4733 Obstructive sleep apnea (adult) (pediatric): Secondary | ICD-10-CM | POA: Diagnosis not present

## 2021-03-29 DIAGNOSIS — I5042 Chronic combined systolic (congestive) and diastolic (congestive) heart failure: Secondary | ICD-10-CM

## 2021-03-29 DIAGNOSIS — D509 Iron deficiency anemia, unspecified: Secondary | ICD-10-CM

## 2021-03-29 LAB — IRON AND TIBC
Iron: 35 ug/dL (ref 28–170)
Saturation Ratios: 9 % — ABNORMAL LOW (ref 10.4–31.8)
TIBC: 382 ug/dL (ref 250–450)
UIBC: 347 ug/dL

## 2021-03-29 LAB — BASIC METABOLIC PANEL
Anion gap: 7 (ref 5–15)
BUN: 6 mg/dL (ref 6–20)
CO2: 29 mmol/L (ref 22–32)
Calcium: 9.3 mg/dL (ref 8.9–10.3)
Chloride: 101 mmol/L (ref 98–111)
Creatinine, Ser: 0.93 mg/dL (ref 0.44–1.00)
GFR, Estimated: >60 mL/min (ref >60)
Glucose, Bld: 100 mg/dL — ABNORMAL HIGH (ref 70–99)
Potassium: 3.8 mmol/L (ref 3.5–5.1)
Sodium: 137 mmol/L (ref 135–145)

## 2021-03-29 LAB — CBC
HCT: 41.1 % (ref 36.0–46.0)
Hemoglobin: 12.4 g/dL (ref 12.0–15.0)
MCH: 22.8 pg — ABNORMAL LOW (ref 26.0–34.0)
MCHC: 30.2 g/dL (ref 30.0–36.0)
MCV: 75.4 fL — ABNORMAL LOW (ref 80.0–100.0)
Platelets: 317 10*3/uL (ref 150–400)
RBC: 5.45 MIL/uL — ABNORMAL HIGH (ref 3.87–5.11)
RDW: 15.5 % (ref 11.5–15.5)
WBC: 15.1 10*3/uL — ABNORMAL HIGH (ref 4.0–10.5)
nRBC: 0 % (ref 0.0–0.2)

## 2021-03-29 NOTE — Progress Notes (Addendum)
Advanced [facility] Failure Clinic Note   PCP: Donato Schultz, DO PCP-Cardiologist: Chilton Si, MD  HF Cardiologist: Dr. Gala Romney, MD  Reason for Visit: F/u for chronic systolic heart failure   HPI:  [name] is a 40 year old female with a history of obesity, DM, HTN, asthma, OSA and severe systolic HF.  Echo 2010  EF 55-60%   Admitted 12/21 at [redacted] weeks pregnant, with first baby, with hypertension and lower extremity edema and found to have acute systolic heart failure. Echo completed and showed biventricular dysfunction with EF 15-20%. CO-OX consistent with cardiogenic shock.  Diuresed over 50 pounds and GDMT initiated   Has done fairly well postpartum. Had recent f/u w/ Dr. Gala Romney 11/12/20 and was doing well w/o symptoms. Echo was repeated. EF still low but mildly improved, up to 30% w/ mild-mod RV dysfunction. Digoxin was discontinued and Farxiga 10 mg daily was added.   On 3/10, pt contacted clinic w/ reports of increased dyspnea and 3 lb wt gain. She was advised to increase lasix to 40 mg daily x 2 days.   She returned f/u on 3/15. NYHA II-early IIIb symptoms.  She was tolerating [place] ok w/o SE. No GU symptoms. Cleda Daub and Sherryll Burger increased.   Follow-up on 12/30/20. She ran out of her meds 1 week prior. Not wearing CPAP. Wt was up 10 lbs from previous visit. Asking about diet pills. She was COVID+ 01/09/21.   Last visit lasix increased to 40mg  daily for weight gain, ivabradine increased to 7.5 BID, yeast infection treated with fluconazole.  Not wearing cpap.    Today she returns for HF follow up. Overall feeling fatigued which she says is due to waking with her baby every 2 hours at night. Currently has a yeast infection, has had 3 in last 6 months, now has IUD. Denies increasing SOB, CP, dizziness, edema, or PND/Orthopnea. Appetite ok. No fever or chills. Weight at home ~268 pounds. Taking all medications, but has missed 1 day this past week. Back to working FT. Not  wearing CPAP, waking with baby every 2 hours at night, partner works 3rd shift. Frustrated with weight gain.  Here for follow up, reports breathing doing well on 40mg  lasix, but has gained 6 more lbs since last visit but unsure if that is fluid weight.  Still gets winded going up a hill or a flight of stairs but overall feels much improved compared with hospitalization.  Still struggling with fatigue which is her main complaint.  Does have a history of heavy periods in the past but no period in months due to IUD.      Echo 08/24/20: Biventricular HF EF < 20%  Echo 2/22: EF 30% mild to mod RV dysfunction     FH: Dad has A FIb, Aunt heart failure.  SH: Works full time 14/7/21. Does not drink alcohol or smoke.  Note: Baby Girls' name is 3/22  Past Medical History:  Diagnosis Date   Anemia    Asthma    Genital herpes    Gestational diabetes    Obesity    Pre-diabetes    Thyroid disease    Current Outpatient Medications  Medication Sig Dispense Refill   albuterol (PROAIR HFA) 108 (90 Base) MCG/ACT inhaler Inhale 2 puffs into the lungs every 6 (six) hours as needed. 6.7 g 1   aspirin 81 MG chewable tablet Chew 81 mg by mouth daily.     calcium carbonate (TUMS - DOSED IN MG ELEMENTAL CALCIUM)  500 MG chewable tablet Chew 1-2 tablets by mouth daily as needed for indigestion or heartburn.     carvedilol (COREG) 3.125 MG tablet TAKE 1 TABLET (3.125 MG TOTAL) BY MOUTH TWO TIMES DAILY WITH A MEAL. 60 tablet 5   COVID-19 mRNA vaccine, Moderna, 100 MCG/0.5ML injection INJECT AS DIRECTED .25 mL 0   dapagliflozin propanediol (FARXIGA) 10 MG TABS tablet TAKE 1 TABLET (10 MG TOTAL) BY MOUTH DAILY BEFORE BREAKFAST. 30 tablet 11   diphenhydramine-acetaminophen (TYLENOL PM) 25-500 MG TABS tablet Take 1 tablet by mouth at bedtime as needed (sleep).     FLUoxetine (PROZAC) 20 MG capsule TAKE 1 CAPSULE (20 MG TOTAL) BY MOUTH DAILY. 30 capsule 2   furosemide (LASIX) 20 MG tablet Take 2 tablets (40  mg total) by mouth daily. 60 tablet 5   glucose blood test strip USE 1 STRIP TO CHECK BLOOD GLUCOSE FOUR TIMES DAILY. (FASTING AND TWO HOURS AFTER MEALS) (Patient taking differently: USE 1 STRIP TO CHECK BLOOD GLUCOSE FOUR TIMES DAILY. (FASTING AND TWO HOURS AFTER MEALS)) 100 strip 3   hydrALAZINE (APRESOLINE) 50 MG tablet TAKE 0.5 TABLETS (25 MG TOTAL) BY MOUTH EVERY 8 (EIGHT) HOURS. 45 tablet 6   isosorbide dinitrate (ISORDIL) 10 MG tablet TAKE 1 TABLET (10 MG TOTAL) BY MOUTH THREE TIMES DAILY. 90 tablet 5   ivabradine (CORLANOR) 7.5 MG TABS tablet Take 1 tablet (7.5 mg total) by mouth 2 (two) times daily with a meal. 60 tablet 11   potassium chloride SA (KLOR-CON) 20 MEQ tablet TAKE 1 TABLET (20 MEQ TOTAL) BY MOUTH DAILY. 90 tablet 3   sacubitril-valsartan (ENTRESTO) 97-103 MG Take 1 tablet by mouth 2 (two) times daily. 60 tablet 11   spironolactone (ALDACTONE) 25 MG tablet TAKE 1 TABLET (25 MG TOTAL) BY MOUTH DAILY. 30 tablet 11   valACYclovir (VALTREX) 1000 MG tablet Take 1 tablet (1,000 mg total) by mouth daily. 90 tablet 3   No current facility-administered medications for this encounter.   Allergies  Allergen Reactions   Clomiphene Hives    Hives and lips swelling.    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    Employer: [employer]  Tobacco Use   Smoking status: Never   Smokeless tobacco: Never  Vaping Use   Vaping Use: Never used  Substance and Sexual Activity   Alcohol use: Yes    Comment: SOCIAL    Drug use: No   Sexual activity: Yes    Partners: Male    Birth control/protection: None    Comment: 1st intercourse- 16, partners- 10, current partner- 4 yrs   Other Topics Concern   Not on file  Social History Narrative   Not on file   Social Determinants of  Health   Financial Resource Strain: Not on file  Food Insecurity: No Food Insecurity   Worried About Programme researcher, broadcasting/film/video in the Last Year: Never true   Barista in the Last Year: Never true  Transportation Needs: Not on file  Physical Activity: Not on file  Stress: Not on file  Social Connections: Not on file  Intimate Partner Violence: Not on file     Family History  Problem Relation Age of Onset   Hyperlipidemia  Mother    Hypertension Mother    Depression Father    Hyperlipidemia Father    Heart disease Father 63       MI   Hypertension Father    Cancer Father 52       prostate   Cancer Paternal Grandfather        prostate   Coronary artery disease Other    Prostate cancer Other    Cancer Maternal Grandfather        prostate    BP 102/62   Pulse 60   Wt 124.9 kg (275 lb 6.4 oz)   SpO2 99%   BMI 41.87 kg/m   Wt Readings from Last 3 Encounters:  03/29/21 124.9 kg (275 lb 6.4 oz)  03/08/21 122.4 kg (269 lb 12.8 oz)  02/10/21 120.7 kg (266 lb 3.2 oz)   PHYSICAL EXAM: General:  NAD. No resp difficulty HEENT: Normal Neck: Supple. Thick neck, difficult to assess JVD. Carotids 2+ bilat; no bruits. No lymphadenopathy or thryomegaly appreciated. Cor: PMI nondisplaced. Regular rate & rhythm. No rubs, gallops or murmurs. Lungs: Clear Abdomen: Obese, soft, nontender, nondistended. No hepatosplenomegaly. No bruits or masses. Good bowel sounds. Extremities: No cyanosis, clubbing, rash, edema Neuro: alert & oriented x 3, cranial nerves grossly intact. Moves all 4 extremities w/o difficulty. Affect pleasant.    ASSESSMENT & PLAN 1. Chronic Biventricular Heart Failure - Echo 12/21 severely reduced EF 15-20% . Possible HTN CM. - C-section 12/11 without complications.  - Repeat Echo: 11/12/20: EF 30% mild to mod RV dysfunction  - NYHA II. Does not appear significantly fluid overloaded on exam, but wt continues to rise. - HR today 60. Decrease ivabradine to 5 bid  -  Continue lasix 40 mg daily. - Continue carvedilol 3.125 mg bid.  - Continue hydralazine 25 tid/isordil 10 tid. - Continue Entresto 97/103 mg bid.  - Continue spiro 25 mg daily. - Continue Farxiga 10 mg daily for now.  - Discussed reliable birth control & avoidance of future pregnancy. She has an IUD in place. - BMET & BNP today; repeat BMET in 10 days. - Stressed importance to adhere to medication regimen.  2. Sinus tachycardia - Resolved. HR 60s today. - Decrease ivabradine.   3. HTN - Controlled on current regimen.    4. DM2 - a1c 5.4 (12/21). - Per PCP. - On Farxiga.    5. OSA - Recently diagnosed.  - Encouraged to wear CPAP nightly, hopeful to improve her fatigue.   6. Obesity - Body mass index is 41.87 kg/m. - 6 months post-partum  - I offered referral to healthy wt management clinic but she declined at this time, will try lifestyle modification. - Been on Saxenda in past, but unable to afford. May be a good candidate for Wegovy/Ozempic, advised follow up with PCP to discuss.  7. Candidasis, vulvovaginal - Fluconazole x 1, QTc today 435 ms. - ideally needs to stop [place], adamant she wants to continue. - I think it's reasonable to have her follow up with her GYN to make sure current infection is not BV, or related to IUD strings.   F/u in 4 weeks w/ Dr. Gala Romney as scheduled.  Angelita Ingles, MD 03/29/21   Patient seen and examined with the above-signed Advanced Practice Provider and/or Housestaff. I personally reviewed laboratory data, imaging studies and relevant notes. I independently examined the patient and formulated the important aspects of the plan. I have edited the note to reflect any of my changes or  salient points. I have personally discussed the plan with the patient and/or family.  Feels great. Tolerating meds well. Volume status well controlled.   General:  Well appearing. No resp difficulty HEENT: normal Neck: supple. no JVD. Carotids 2+  bilat; no bruits. No lymphadenopathy or thryomegaly appreciated. Cor: PMI nondisplaced. Regular rate & rhythm. No rubs, gallops or murmurs. Lungs: clear Abdomen: obese soft, nontender, nondistended. No hepatosplenomegaly. No bruits or masses. Good bowel sounds. Extremities: no cyanosis, clubbing, rash, edema Neuro: alert & orientedx3, cranial nerves grossly intact. moves all 4 extremities w/o difficulty. Affect pleasant  Overall doing very well. NYHA I-II. Volume status looks good. Sinus tachycardia much improved. HR 60s. Can cut ivabradine back. Bedside echo done in clinic EF ~45%. Encouraged her to wear CPAP to help with fatigue. Agree with checking iron stores.   Arvilla Meres, MD  12:17 AM

## 2021-03-29 NOTE — Patient Instructions (Signed)
Labs done today, your results will be available in MyChart, we will contact you for abnormal readings.  Please call our office in December to schedule your follow up appointment  If you have any questions or concerns before your next appointment please send [place] a message through [place] or call our office at [phone].    TO LEAVE A MESSAGE FOR THE NURSE SELECT OPTION 2, PLEASE LEAVE A MESSAGE INCLUDING: YOUR NAME DATE OF BIRTH CALL BACK NUMBER REASON FOR CALL**this is important as we prioritize the call backs  YOU WILL RECEIVE A CALL BACK THE SAME DAY AS LONG AS YOU CALL BEFORE 4:00 PM  At the [facility], you and your health needs are our priority. As part of our continuing mission to provide you with exceptional heart care, we have created designated Provider Care Teams. These Care Teams include your primary Cardiologist (physician) and Advanced Practice Providers (APPs- Physician Assistants and Nurse Practitioners) who all work together to provide you with the care you need, when you need it.   You may see any of the following providers on your designated Care Team at your next follow up: Dr Arvilla Meres Dr Marca Ancona Dr Brandon Melnick, NP Robbie Lis, [place] Mikki Santee Karle Plumber, PharmD   Please be sure to bring in all your medications bottles to every appointment.

## 2021-03-30 ENCOUNTER — Encounter (HOSPITAL_COMMUNITY): Payer: BC Managed Care – PPO | Admitting: Internal Medicine

## 2021-03-31 ENCOUNTER — Telehealth (HOSPITAL_COMMUNITY): Payer: Self-pay | Admitting: Internal Medicine

## 2021-03-31 DIAGNOSIS — D500 Iron deficiency anemia secondary to blood loss (chronic): Secondary | ICD-10-CM

## 2021-03-31 NOTE — Telephone Encounter (Signed)
Discussed lab results with patient CBC, BMP and iron studies.  Recommended IV iron infusion given prolonged fatigue, hx of systolic CHF.  Ordered feraheme 510mg .  She was given phone number to schedule her infusion at her convenience.

## 2021-04-12 ENCOUNTER — Other Ambulatory Visit (HOSPITAL_COMMUNITY): Payer: Self-pay

## 2021-04-12 MED ORDER — VALACYCLOVIR HCL 1 G PO TABS
1000.0000 mg | ORAL_TABLET | Freq: Every day | ORAL | 1 refills | Status: DC
  Filled 2021-04-12 – 2021-05-27 (×2): qty 90, 90d supply, fill #0

## 2021-04-12 MED ORDER — FLUOXETINE HCL 20 MG PO CAPS
20.0000 mg | ORAL_CAPSULE | Freq: Every day | ORAL | 1 refills | Status: DC
  Filled 2021-04-12 – 2021-05-27 (×2): qty 90, 90d supply, fill #0
  Filled 2021-09-14: qty 90, 90d supply, fill #1

## 2021-04-20 ENCOUNTER — Other Ambulatory Visit (HOSPITAL_COMMUNITY): Payer: Self-pay

## 2021-05-19 DIAGNOSIS — L910 Hypertrophic scar: Secondary | ICD-10-CM | POA: Diagnosis not present

## 2021-05-27 ENCOUNTER — Other Ambulatory Visit (HOSPITAL_COMMUNITY): Payer: Self-pay

## 2021-05-27 MED FILL — Hydralazine HCl Tab 50 MG: ORAL | 30 days supply | Qty: 45 | Fill #1 | Status: AC

## 2021-05-27 MED FILL — Potassium Chloride Microencapsulated Crys ER Tab 20 mEq: ORAL | 90 days supply | Qty: 90 | Fill #1 | Status: AC

## 2021-05-27 MED FILL — Isosorbide Dinitrate Tab 10 MG: ORAL | 30 days supply | Qty: 90 | Fill #1 | Status: AC

## 2021-05-27 MED FILL — Dapagliflozin Propanediol Tab 10 MG (Base Equivalent): ORAL | 30 days supply | Qty: 30 | Fill #3 | Status: AC

## 2021-05-27 MED FILL — Spironolactone Tab 25 MG: ORAL | 30 days supply | Qty: 30 | Fill #2 | Status: AC

## 2021-05-27 MED FILL — Carvedilol Tab 3.125 MG: ORAL | 30 days supply | Qty: 60 | Fill #1 | Status: AC

## 2021-07-12 DIAGNOSIS — H5213 Myopia, bilateral: Secondary | ICD-10-CM | POA: Diagnosis not present

## 2021-07-12 DIAGNOSIS — H1045 Other chronic allergic conjunctivitis: Secondary | ICD-10-CM | POA: Diagnosis not present

## 2021-07-14 DIAGNOSIS — L910 Hypertrophic scar: Secondary | ICD-10-CM | POA: Diagnosis not present

## 2021-07-31 ENCOUNTER — Emergency Department (HOSPITAL_BASED_OUTPATIENT_CLINIC_OR_DEPARTMENT_OTHER): Payer: BC Managed Care – PPO

## 2021-07-31 ENCOUNTER — Inpatient Hospital Stay (HOSPITAL_BASED_OUTPATIENT_CLINIC_OR_DEPARTMENT_OTHER)
Admission: EM | Admit: 2021-07-31 | Discharge: 2021-08-07 | DRG: 336 | Disposition: A | Discharge status: Home / Self Care | Payer: BC Managed Care – PPO | Attending: Internal Medicine | Admitting: Internal Medicine

## 2021-07-31 ENCOUNTER — Other Ambulatory Visit: Payer: Self-pay

## 2021-07-31 ENCOUNTER — Encounter (HOSPITAL_BASED_OUTPATIENT_CLINIC_OR_DEPARTMENT_OTHER): Payer: Self-pay | Admitting: Obstetrics and Gynecology

## 2021-07-31 DIAGNOSIS — Z8249 Family history of ischemic heart disease and other diseases of the circulatory system: Secondary | ICD-10-CM | POA: Diagnosis not present

## 2021-07-31 DIAGNOSIS — Z7982 Long term (current) use of aspirin: Secondary | ICD-10-CM | POA: Diagnosis not present

## 2021-07-31 DIAGNOSIS — Z20822 Contact with and (suspected) exposure to covid-19: Secondary | ICD-10-CM | POA: Diagnosis not present

## 2021-07-31 DIAGNOSIS — K430 Incisional hernia with obstruction, without gangrene: Principal | ICD-10-CM | POA: Diagnosis present

## 2021-07-31 DIAGNOSIS — Z01810 Encounter for preprocedural cardiovascular examination: Secondary | ICD-10-CM | POA: Diagnosis not present

## 2021-07-31 DIAGNOSIS — Z79899 Other long term (current) drug therapy: Secondary | ICD-10-CM | POA: Diagnosis not present

## 2021-07-31 DIAGNOSIS — K660 Peritoneal adhesions (postprocedural) (postinfection): Secondary | ICD-10-CM | POA: Diagnosis not present

## 2021-07-31 DIAGNOSIS — Z7951 Long term (current) use of inhaled steroids: Secondary | ICD-10-CM

## 2021-07-31 DIAGNOSIS — I509 Heart failure, unspecified: Secondary | ICD-10-CM | POA: Diagnosis not present

## 2021-07-31 DIAGNOSIS — Z975 Presence of (intrauterine) contraceptive device: Secondary | ICD-10-CM

## 2021-07-31 DIAGNOSIS — I5082 Biventricular heart failure: Secondary | ICD-10-CM | POA: Diagnosis not present

## 2021-07-31 DIAGNOSIS — R109 Unspecified abdominal pain: Secondary | ICD-10-CM | POA: Diagnosis not present

## 2021-07-31 DIAGNOSIS — E559 Vitamin D deficiency, unspecified: Secondary | ICD-10-CM | POA: Diagnosis not present

## 2021-07-31 DIAGNOSIS — G4733 Obstructive sleep apnea (adult) (pediatric): Secondary | ICD-10-CM | POA: Diagnosis not present

## 2021-07-31 DIAGNOSIS — Z6841 Body Mass Index (BMI) 40.0 and over, adult: Secondary | ICD-10-CM

## 2021-07-31 DIAGNOSIS — Z98891 History of uterine scar from previous surgery: Secondary | ICD-10-CM | POA: Diagnosis not present

## 2021-07-31 DIAGNOSIS — I428 Other cardiomyopathies: Secondary | ICD-10-CM | POA: Diagnosis not present

## 2021-07-31 DIAGNOSIS — Z7984 Long term (current) use of oral hypoglycemic drugs: Secondary | ICD-10-CM

## 2021-07-31 DIAGNOSIS — D72829 Elevated white blood cell count, unspecified: Secondary | ICD-10-CM | POA: Diagnosis present

## 2021-07-31 DIAGNOSIS — Z888 Allergy status to other drugs, medicaments and biological substances status: Secondary | ICD-10-CM | POA: Diagnosis not present

## 2021-07-31 DIAGNOSIS — I110 Hypertensive heart disease with heart failure: Secondary | ICD-10-CM | POA: Diagnosis not present

## 2021-07-31 DIAGNOSIS — K436 Other and unspecified ventral hernia with obstruction, without gangrene: Secondary | ICD-10-CM | POA: Diagnosis not present

## 2021-07-31 DIAGNOSIS — Z83438 Family history of other disorder of lipoprotein metabolism and other lipidemia: Secondary | ICD-10-CM | POA: Diagnosis not present

## 2021-07-31 DIAGNOSIS — I10 Essential (primary) hypertension: Secondary | ICD-10-CM | POA: Diagnosis present

## 2021-07-31 DIAGNOSIS — Z818 Family history of other mental and behavioral disorders: Secondary | ICD-10-CM

## 2021-07-31 DIAGNOSIS — Z8042 Family history of malignant neoplasm of prostate: Secondary | ICD-10-CM

## 2021-07-31 DIAGNOSIS — K5669 Other partial intestinal obstruction: Secondary | ICD-10-CM | POA: Diagnosis not present

## 2021-07-31 DIAGNOSIS — I5042 Chronic combined systolic (congestive) and diastolic (congestive) heart failure: Secondary | ICD-10-CM | POA: Diagnosis not present

## 2021-07-31 DIAGNOSIS — J449 Chronic obstructive pulmonary disease, unspecified: Secondary | ICD-10-CM | POA: Diagnosis not present

## 2021-07-31 DIAGNOSIS — E119 Type 2 diabetes mellitus without complications: Secondary | ICD-10-CM | POA: Diagnosis present

## 2021-07-31 DIAGNOSIS — K469 Unspecified abdominal hernia without obstruction or gangrene: Secondary | ICD-10-CM

## 2021-07-31 DIAGNOSIS — K56609 Unspecified intestinal obstruction, unspecified as to partial versus complete obstruction: Secondary | ICD-10-CM | POA: Diagnosis present

## 2021-07-31 DIAGNOSIS — Z8632 Personal history of gestational diabetes: Secondary | ICD-10-CM

## 2021-07-31 DIAGNOSIS — Z9114 Patient's other noncompliance with medication regimen: Secondary | ICD-10-CM | POA: Diagnosis not present

## 2021-07-31 DIAGNOSIS — Z4682 Encounter for fitting and adjustment of non-vascular catheter: Secondary | ICD-10-CM | POA: Diagnosis not present

## 2021-07-31 DIAGNOSIS — I5022 Chronic systolic (congestive) heart failure: Secondary | ICD-10-CM | POA: Diagnosis present

## 2021-07-31 LAB — CBC
HCT: 39.8 % (ref 36.0–46.0)
Hemoglobin: 12.3 g/dL (ref 12.0–15.0)
MCH: 22.7 pg — ABNORMAL LOW (ref 26.0–34.0)
MCHC: 30.9 g/dL (ref 30.0–36.0)
MCV: 73.4 fL — ABNORMAL LOW (ref 80.0–100.0)
Platelets: 304 10*3/uL (ref 150–400)
RBC: 5.42 MIL/uL — ABNORMAL HIGH (ref 3.87–5.11)
RDW: 16 % — ABNORMAL HIGH (ref 11.5–15.5)
WBC: 16.1 10*3/uL — ABNORMAL HIGH (ref 4.0–10.5)
nRBC: 0 % (ref 0.0–0.2)

## 2021-07-31 LAB — COMPREHENSIVE METABOLIC PANEL
ALT: 8 U/L (ref 0–44)
AST: 9 U/L — ABNORMAL LOW (ref 15–41)
Albumin: 3.9 g/dL (ref 3.5–5.0)
Alkaline Phosphatase: 137 U/L — ABNORMAL HIGH (ref 38–126)
Anion gap: 9 (ref 5–15)
BUN: 7 mg/dL (ref 6–20)
CO2: 30 mmol/L (ref 22–32)
Calcium: 9.3 mg/dL (ref 8.9–10.3)
Chloride: 101 mmol/L (ref 98–111)
Creatinine, Ser: 0.77 mg/dL (ref 0.44–1.00)
GFR, Estimated: >60 mL/min (ref >60)
Glucose, Bld: 107 mg/dL — ABNORMAL HIGH (ref 70–99)
Potassium: 3.5 mmol/L (ref 3.5–5.1)
Sodium: 140 mmol/L (ref 135–145)
Total Bilirubin: 0.6 mg/dL (ref 0.3–1.2)
Total Protein: 7.5 g/dL (ref 6.5–8.1)

## 2021-07-31 LAB — URINALYSIS, ROUTINE W REFLEX MICROSCOPIC
Bilirubin Urine: NEGATIVE
Glucose, UA: NEGATIVE mg/dL
Hgb urine dipstick: NEGATIVE
Ketones, ur: NEGATIVE mg/dL
Leukocytes,Ua: NEGATIVE
Nitrite: NEGATIVE
Protein, ur: NEGATIVE mg/dL
Specific Gravity, Urine: 1.022 (ref 1.005–1.030)
pH: 7 (ref 5.0–8.0)

## 2021-07-31 LAB — PREGNANCY, URINE: Preg Test, Ur: NEGATIVE

## 2021-07-31 LAB — LIPASE, BLOOD: Lipase: 12 U/L (ref 11–51)

## 2021-07-31 MED ORDER — ONDANSETRON HCL 4 MG/2ML IJ SOLN
4.0000 mg | Freq: Once | INTRAMUSCULAR | Status: AC
  Administered 2021-07-31: 4 mg via INTRAVENOUS
  Filled 2021-07-31: qty 2

## 2021-07-31 MED ORDER — MIDAZOLAM HCL 2 MG/2ML IJ SOLN
2.0000 mg | Freq: Once | INTRAMUSCULAR | Status: AC
  Administered 2021-07-31: 2 mg via INTRAVENOUS
  Filled 2021-07-31: qty 2

## 2021-07-31 MED ORDER — HYDROMORPHONE HCL 1 MG/ML IJ SOLN
1.0000 mg | Freq: Once | INTRAMUSCULAR | Status: AC
Start: 2021-07-31 — End: 2021-07-31
  Administered 2021-07-31: 1 mg via INTRAVENOUS
  Filled 2021-07-31: qty 1

## 2021-07-31 MED ORDER — LACTATED RINGERS IV BOLUS
1000.0000 mL | Freq: Once | INTRAVENOUS | Status: AC
  Administered 2021-07-31: 1000 mL via INTRAVENOUS

## 2021-07-31 MED ORDER — IOHEXOL 300 MG/ML  SOLN
100.0000 mL | Freq: Once | INTRAMUSCULAR | Status: AC | PRN
  Administered 2021-07-31: 100 mL via INTRAVENOUS

## 2021-07-31 NOTE — ED Notes (Signed)
Denies N/V. Asking for pain meds.

## 2021-07-31 NOTE — ED Provider Notes (Signed)
[facility] EMERGENCY DEPT Provider Note   CSN: [id number] Arrival date & time: 07/31/21  1806     History Chief Complaint  Patient presents with   Abdominal Pain    SAHIRA BEX is a 40 y.o. female.   Abdominal Pain Associated symptoms: nausea and vomiting   Associated symptoms: no chest pain, no chills, no cough, no dysuria, no fever, no hematuria, no shortness of breath and no sore throat    40 year old female with a history of obesity, anemia, presenting to the emergency department with sudden onset nausea and vomiting and periumbilical abdominal pain.  The patient endorses sharp, severe, sudden onset periumbilical abdominal pain that came on around noon today.  No aggravating or alleviating factors.  Patient has not tried tolerating any oral intake.  She denies any fevers or chills.  She denies any diarrhea or dysuria.  Past Medical History:  Diagnosis Date   Anemia    Asthma    Genital herpes    Gestational diabetes    Obesity    Pre-diabetes    Thyroid disease     Patient Active Problem List   Diagnosis Date Noted   Small bowel obstruction ([place]) 07/31/2021   Cesarean delivery - 31 wks/cardiac dz 08/28/2020   Postpartum care following cesarean delivery 08/28/2020 08/28/2020   OSA (obstructive sleep apnea) 08/25/2020   Shortness of breath 08/24/2020   Pulmonary vascular congestion    Acute on chronic diastolic congestive heart failure (HCC)    Edema    Acute combined systolic and diastolic heart failure, NYHA class 4 ([place])    Essential hypertension    White classification A2 gestational diabetes mellitus (GDM), insulin controlled 06/04/2020   B12 deficiency 05/28/2015   Vitamin D deficiency 05/28/2015   Secondary amenorrhea 08/28/2013   Genital herpes 07/19/2012   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, oral cavity, 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/20/2012   Cheloid 02/20/2012   THYROID NODULE 12/21/2009   WRIST PAIN, LEFT 12/14/2009    LOW BACK PAIN, ACUTE 10/12/2009   DYSURIA 10/12/2009   Goiter 07/23/2009   CARDIOMEGALY, MILD 07/13/2009   ELEVATED BP READING WITHOUT DX HYPERTENSION 09/21/2008   LEUKOCYTOSIS UNSPECIFIED 08/27/2008   UTI 02/24/2008   Morbid obesity ([place]) 03/11/2007   ANEMIA-IRON DEFICIENCY 11/05/2006   Asthma 11/05/2006   DISEASE, 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    Past Surgical History:  Procedure Laterality Date   CESAREAN SECTION N/A 08/28/2020   Procedure: CESAREAN SECTION;  Surgeon: Brien Few, MD;  Location: [facility];  Service: Obstetrics;  Laterality: N/A;   DILATATION & CURETTAGE/HYSTEROSCOPY WITH MYOSURE N/A 09/24/2018   Procedure: [place] POLYPECTOMY;  Surgeon: Princess Bruins, MD;  Location: [facility];  Service: Gynecology;  Laterality: N/A;   [place] EXCISION  2008   removed @[facility] ----- 10/2011   KELOID EXCISION  09/20/2018   behind right ear   SALIVARY GLAND SURGERY     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     OB History     Gravida  1   Para  1   Term  0   Preterm  1   AB  0   Living  1      SAB  0   IAB  0   Ectopic  0   Multiple  0   Live Births  1           Family History  Problem Relation Age of Onset   Hyperlipidemia Mother  Hypertension Mother    Depression Father    Hyperlipidemia Father    Heart disease Father 55       MI   Hypertension Father    Cancer Father 52       prostate   Cancer Paternal Grandfather        prostate   Coronary artery disease Other    Prostate cancer Other    Cancer Maternal Grandfather        prostate    Social History   Tobacco Use   Smoking status: Never    Passive exposure: Never   Smokeless tobacco: Never  Vaping Use   Vaping Use: Never used  Substance Use Topics   Alcohol use: Yes    Comment: SOCIAL    Drug use: No    Home Medications Prior to Admission medications   Medication Sig Start Date End Date Taking? Authorizing Provider  albuterol  (PROAIR HFA) 108 (90 Base) MCG/ACT inhaler Inhale 2 puffs into the lungs every 6 (six) hours as needed. 03/02/16   Ann Held, DO  aspirin 81 MG chewable tablet Chew 81 mg by mouth daily.    [provider]  calcium carbonate (TUMS - DOSED IN MG ELEMENTAL CALCIUM) 500 MG chewable tablet Chew 1-2 tablets by mouth daily as needed for indigestion or heartburn.    [provider]  carvedilol (COREG) 3.125 MG tablet TAKE 1 TABLET (3.125 MG TOTAL) BY MOUTH TWO TIMES DAILY WITH A MEAL. 09/01/20 09/01/21  Clegg, Amy D, NP  dapagliflozin propanediol (FARXIGA) 10 MG TABS tablet TAKE 1 TABLET (10 MG TOTAL) BY MOUTH DAILY BEFORE BREAKFAST. 11/12/20 11/12/21  Bensimhon, Shaune Pascal, MD  diphenhydramine-acetaminophen (TYLENOL PM) 25-500 MG TABS tablet Take 1 tablet by mouth at bedtime as needed (sleep).    [provider]  FLUoxetine (PROZAC) 20 MG capsule TAKE 1 CAPSULE (20 MG TOTAL) BY MOUTH DAILY. 09/01/20 09/01/21  Clegg, Amy D, NP  FLUoxetine (PROZAC) 20 MG capsule Take 1 capsule (20 mg total) by mouth daily. 04/11/21     furosemide (LASIX) 20 MG tablet Take 2 tablets (40 mg total) by mouth daily. 03/08/21   Milford, Maricela Bo, FNP  hydrALAZINE (APRESOLINE) 50 MG tablet TAKE 0.5 TABLETS (25 MG TOTAL) BY MOUTH EVERY 8 (EIGHT) HOURS. 10/05/20 10/05/21  Bensimhon, Shaune Pascal, MD  isosorbide dinitrate (ISORDIL) 10 MG tablet TAKE 1 TABLET (10 MG TOTAL) BY MOUTH THREE TIMES DAILY. 09/01/20 09/01/21  Darrick Grinder D, NP  ivabradine (CORLANOR) 7.5 MG TABS tablet Take 1 tablet (7.5 mg total) by mouth 2 (two) times daily with a meal. 03/08/21   Milford, Maricela Bo, FNP  potassium chloride SA (KLOR-CON) 20 MEQ tablet TAKE 1 TABLET (20 MEQ TOTAL) BY MOUTH DAILY. 09/09/20 09/09/21  Rafael Bihari, FNP  sacubitril-valsartan (ENTRESTO) 97-103 MG Take 1 tablet by mouth 2 (two) times daily. 02/10/21   Bensimhon, Shaune Pascal, MD  spironolactone (ALDACTONE) 25 MG tablet TAKE 1 TABLET (25 MG TOTAL) BY MOUTH  DAILY. 11/30/20 11/30/21  Consuelo Pandy, PA-C  valACYclovir (VALTREX) 1000 MG tablet Take 1 tablet (1,000 mg total) by mouth daily. 09/23/19   Ann Held, DO  valACYclovir (VALTREX) 1000 MG tablet Take 1 tablet (1,000 mg total) by mouth daily. 04/11/21     budesonide (PULMICORT FLEXHALER) 180 MCG/ACT inhaler Inhale 2 puffs into the lungs every 12 (twelve) hours. 07/07/20 11/12/20  Laurin Coder, MD    Allergies    Clomiphene  Review of  Systems   Review of Systems  Constitutional:  Negative for chills and fever.  HENT:  Negative for ear pain and sore throat.   Eyes:  Negative for pain and visual disturbance.  Respiratory:  Negative for cough and shortness of breath.   Cardiovascular:  Negative for chest pain and palpitations.  Gastrointestinal:  Positive for abdominal pain, nausea and vomiting.  Genitourinary:  Negative for dysuria and hematuria.  Musculoskeletal:  Negative for arthralgias and back pain.  Skin:  Negative for color change and rash.  Neurological:  Negative for seizures and syncope.  All other systems reviewed and are negative.  Physical Exam Updated Vital Signs BP (!) 141/81   Pulse 86   Temp 98.5 F (36.9 C)   Resp 18   LMP  (Exact Date)   SpO2 98%   Breastfeeding No   Physical Exam Vitals and nursing note reviewed.  Constitutional:      General: She is in acute distress.     Appearance: She is well-developed.  HENT:     Head: Normocephalic and atraumatic.  Eyes:     Conjunctiva/sclera: Conjunctivae normal.     Pupils: Pupils are equal, round, and reactive to light.  Cardiovascular:     Rate and Rhythm: Normal rate and regular rhythm.     Heart sounds: No murmur heard. Pulmonary:     Effort: Pulmonary effort is normal. No respiratory distress.     Breath sounds: Normal breath sounds.  Abdominal:     General: There is no distension.     Palpations: Abdomen is soft.     Tenderness: There is abdominal tenderness in the periumbilical  area. There is guarding. There is no rebound.  Musculoskeletal:        General: No deformity or signs of injury.     Cervical back: Normal range of motion and neck supple.  Skin:    General: Skin is warm and dry.     Findings: No lesion or rash.  Neurological:     General: No focal deficit present.     Mental Status: She is alert. Mental status is at baseline.    ED Results / Procedures / Treatments   Labs (all labs ordered are listed, but only abnormal results are displayed) Labs Reviewed  COMPREHENSIVE METABOLIC PANEL - Abnormal; Notable for the following components:      Result Value   Glucose, Bld 107 (*)    AST 9 (*)    Alkaline Phosphatase 137 (*)    All other components within normal limits  CBC - Abnormal; Notable for the following components:   WBC 16.1 (*)    RBC 5.42 (*)    MCV 73.4 (*)    MCH 22.7 (*)    RDW 16.0 (*)    All other components within normal limits  URINALYSIS, ROUTINE W REFLEX MICROSCOPIC - Abnormal; Notable for the following components:   Bacteria, UA RARE (*)    All other components within normal limits  RESP PANEL BY RT-PCR (FLU A&B, COVID) ARPGX2  LIPASE, BLOOD  PREGNANCY, URINE    EKG None  Radiology CT ABDOMEN PELVIS W CONTRAST  Result Date: 07/31/2021 CLINICAL DATA:  Bowel obstruction suspected.  Abdominal pain EXAM: CT ABDOMEN AND PELVIS WITH CONTRAST TECHNIQUE: Multidetector CT imaging of the abdomen and pelvis was performed using the standard protocol following bolus administration of intravenous contrast. CONTRAST:  154mL OMNIPAQUE IOHEXOL 300 MG/ML  SOLN COMPARISON:  None. FINDINGS: Lower chest: No acute abnormality. Hepatobiliary: Low-density adjacent  to the falciform ligament, likely focal fatty infiltration. Gallbladder unremarkable. Pancreas: No focal abnormality or ductal dilatation. Spleen: No focal abnormality.  Normal size. Adrenals/Urinary Tract: No adrenal abnormality. No focal renal abnormality. No stones or hydronephrosis.  Urinary bladder is unremarkable. Stomach/Bowel: Infraumbilical ventral hernia containing small bowel loops. Prominent fluid-filled small bowel loops in the lower abdomen and pelvis concerning for low grade small bowel obstruction. Stomach is moderately distended with fluid. Large bowel grossly unremarkable. Vascular/Lymphatic: No evidence of aneurysm or adenopathy. Reproductive: Uterus and adnexa unremarkable. No mass. IUD within the uterus. Other: No free fluid or free air. Musculoskeletal: No acute bony abnormality. IMPRESSION: Infraumbilical ventral hernia containing multiple small bowel loops. Small bowel loops in the lower abdomen and pelvis are mildly dilated and fluid-filled. Findings concerning for early/low grade small bowel obstruction. Electronically Signed   By: Charlett Nose M.D.   On: 07/31/2021 22:32    Procedures Procedures   Medications Ordered in ED Medications  lactated ringers bolus 1,000 mL (1,000 mLs Intravenous New Bag/Given 07/31/21 2154)  HYDROmorphone (DILAUDID) injection 1 mg (1 mg Intravenous Given 07/31/21 2155)  ondansetron (ZOFRAN) injection 4 mg (4 mg Intravenous Given 07/31/21 2155)  iohexol (OMNIPAQUE) 300 MG/ML solution 100 mL (100 mLs Intravenous Contrast Given 07/31/21 2159)  midazolam (VERSED) injection 2 mg (2 mg Intravenous Given 07/31/21 2317)    ED Course  I have reviewed the triage vital signs and the nursing notes.  Pertinent labs & imaging results that were available during my care of the patient were reviewed by me and considered in my medical decision making (see chart for details).    MDM Rules/Calculators/A&P                           40 year old female with a history of obesity, anemia, presenting to the emergency department with sudden onset nausea and vomiting and periumbilical abdominal pain.  The patient endorses sharp, severe, sudden onset periumbilical abdominal pain that came on around noon today.  No aggravating or alleviating factors.   Patient has not tried tolerating any oral intake.  She denies any fevers or chills.  She denies any diarrhea or dysuria.  On arrival, the patient was afebrile, hypertensive, hemodynamically stable, saturating well on room air.  Physical exam significant for patient in acute distress from severe pain, with an abdominal exam significant for guarding, periumbilical tenderness to palpation with no rebound tenderness.  Differential diagnosis includes small bowel obstruction, incarcerated hernia, appendicitis, pyelonephritis, diverticulitis, nephrolithiasis.  IV access was obtained and the patient was administered an IV fluid bolus for volume resuscitation and IV Dilaudid for pain control.  A CT of the abdomen pelvis was performed which revealed a small bowel obstruction. On CT the patient was found to have an infraumbilical ventral hernia containing multiple small bowel loops.  Small bowel loops in the lower abdomen and pelvis are mildly dilated and fluid-filled, the findings are concerning for early/low-grade small bowel obstruction.  An NG tube was placed.  The patient on reassessment remained pain-free.  She had a nontender abdomen on repeat examination.  Low concern for incarcerated hernia at this time.  I did consult general surgery who recommended conservative management.  Hospitalist medicine was consulted for admission and Dr. Imogene Burn admitted.  Final Clinical Impression(s) / ED Diagnoses Final diagnoses:  SBO (small bowel obstruction) (HCC)    Rx / DC Orders ED Discharge Orders     None  Regan Lemming, MD 07/31/21 [phone]

## 2021-07-31 NOTE — ED Triage Notes (Signed)
Patient reports to the ER for abdominal pain. Patient reports generalized per-umbilical abdominal pain. Denies N/V/D. Patient reports the pain started about 12 today

## 2021-07-31 NOTE — ED Notes (Signed)
Patient transported to CT 

## 2021-08-01 ENCOUNTER — Inpatient Hospital Stay (HOSPITAL_COMMUNITY): Payer: BC Managed Care – PPO

## 2021-08-01 DIAGNOSIS — Z79899 Other long term (current) drug therapy: Secondary | ICD-10-CM | POA: Diagnosis not present

## 2021-08-01 DIAGNOSIS — I5082 Biventricular heart failure: Secondary | ICD-10-CM | POA: Diagnosis present

## 2021-08-01 DIAGNOSIS — G4733 Obstructive sleep apnea (adult) (pediatric): Secondary | ICD-10-CM | POA: Diagnosis present

## 2021-08-01 DIAGNOSIS — Z8042 Family history of malignant neoplasm of prostate: Secondary | ICD-10-CM | POA: Diagnosis not present

## 2021-08-01 DIAGNOSIS — Z20822 Contact with and (suspected) exposure to covid-19: Secondary | ICD-10-CM | POA: Diagnosis present

## 2021-08-01 DIAGNOSIS — K660 Peritoneal adhesions (postprocedural) (postinfection): Secondary | ICD-10-CM | POA: Diagnosis present

## 2021-08-01 DIAGNOSIS — Z01810 Encounter for preprocedural cardiovascular examination: Secondary | ICD-10-CM | POA: Diagnosis not present

## 2021-08-01 DIAGNOSIS — Z83438 Family history of other disorder of lipoprotein metabolism and other lipidemia: Secondary | ICD-10-CM | POA: Diagnosis not present

## 2021-08-01 DIAGNOSIS — I5042 Chronic combined systolic (congestive) and diastolic (congestive) heart failure: Secondary | ICD-10-CM | POA: Diagnosis not present

## 2021-08-01 DIAGNOSIS — Z7951 Long term (current) use of inhaled steroids: Secondary | ICD-10-CM | POA: Diagnosis not present

## 2021-08-01 DIAGNOSIS — E119 Type 2 diabetes mellitus without complications: Secondary | ICD-10-CM | POA: Diagnosis present

## 2021-08-01 DIAGNOSIS — Z98891 History of uterine scar from previous surgery: Secondary | ICD-10-CM | POA: Diagnosis not present

## 2021-08-01 DIAGNOSIS — Z6841 Body Mass Index (BMI) 40.0 and over, adult: Secondary | ICD-10-CM | POA: Diagnosis not present

## 2021-08-01 DIAGNOSIS — D72829 Elevated white blood cell count, unspecified: Secondary | ICD-10-CM | POA: Diagnosis present

## 2021-08-01 DIAGNOSIS — I428 Other cardiomyopathies: Secondary | ICD-10-CM | POA: Diagnosis present

## 2021-08-01 DIAGNOSIS — I509 Heart failure, unspecified: Secondary | ICD-10-CM

## 2021-08-01 DIAGNOSIS — Z9114 Patient's other noncompliance with medication regimen: Secondary | ICD-10-CM | POA: Diagnosis not present

## 2021-08-01 DIAGNOSIS — K469 Unspecified abdominal hernia without obstruction or gangrene: Secondary | ICD-10-CM

## 2021-08-01 DIAGNOSIS — Z975 Presence of (intrauterine) contraceptive device: Secondary | ICD-10-CM | POA: Diagnosis not present

## 2021-08-01 DIAGNOSIS — Z7984 Long term (current) use of oral hypoglycemic drugs: Secondary | ICD-10-CM | POA: Diagnosis not present

## 2021-08-01 DIAGNOSIS — K56609 Unspecified intestinal obstruction, unspecified as to partial versus complete obstruction: Secondary | ICD-10-CM

## 2021-08-01 DIAGNOSIS — Z8249 Family history of ischemic heart disease and other diseases of the circulatory system: Secondary | ICD-10-CM | POA: Diagnosis not present

## 2021-08-01 DIAGNOSIS — Z888 Allergy status to other drugs, medicaments and biological substances status: Secondary | ICD-10-CM | POA: Diagnosis not present

## 2021-08-01 DIAGNOSIS — K430 Incisional hernia with obstruction, without gangrene: Secondary | ICD-10-CM | POA: Diagnosis present

## 2021-08-01 DIAGNOSIS — Z7982 Long term (current) use of aspirin: Secondary | ICD-10-CM | POA: Diagnosis not present

## 2021-08-01 DIAGNOSIS — I110 Hypertensive heart disease with heart failure: Secondary | ICD-10-CM | POA: Diagnosis present

## 2021-08-01 DIAGNOSIS — I5022 Chronic systolic (congestive) heart failure: Secondary | ICD-10-CM | POA: Diagnosis present

## 2021-08-01 DIAGNOSIS — Z818 Family history of other mental and behavioral disorders: Secondary | ICD-10-CM | POA: Diagnosis not present

## 2021-08-01 LAB — BASIC METABOLIC PANEL
Anion gap: 11 (ref 5–15)
BUN: 7 mg/dL (ref 6–20)
CO2: 27 mmol/L (ref 22–32)
Calcium: 8.9 mg/dL (ref 8.9–10.3)
Chloride: 101 mmol/L (ref 98–111)
Creatinine, Ser: 0.57 mg/dL (ref 0.44–1.00)
GFR, Estimated: >60 mL/min (ref >60)
Glucose, Bld: 125 mg/dL — ABNORMAL HIGH (ref 70–99)
Potassium: 3.4 mmol/L — ABNORMAL LOW (ref 3.5–5.1)
Sodium: 139 mmol/L (ref 135–145)

## 2021-08-01 LAB — HEMOGLOBIN A1C
Hgb A1c MFr Bld: 6.5 % — ABNORMAL HIGH (ref 4.8–5.6)
Mean Plasma Glucose: 139.85 mg/dL

## 2021-08-01 LAB — GLUCOSE, CAPILLARY
Glucose-Capillary: 106 mg/dL — ABNORMAL HIGH (ref 70–99)
Glucose-Capillary: 113 mg/dL — ABNORMAL HIGH (ref 70–99)
Glucose-Capillary: 116 mg/dL — ABNORMAL HIGH (ref 70–99)
Glucose-Capillary: 122 mg/dL — ABNORMAL HIGH (ref 70–99)
Glucose-Capillary: 123 mg/dL — ABNORMAL HIGH (ref 70–99)
Glucose-Capillary: 140 mg/dL — ABNORMAL HIGH (ref 70–99)

## 2021-08-01 LAB — LACTIC ACID, PLASMA: Lactic Acid, Venous: 1.2 mmol/L (ref 0.5–1.9)

## 2021-08-01 LAB — RESP PANEL BY RT-PCR (FLU A&B, COVID) ARPGX2
Influenza A by PCR: NEGATIVE
Influenza B by PCR: NEGATIVE
SARS Coronavirus 2 by RT PCR: NEGATIVE

## 2021-08-01 LAB — HIV ANTIBODY (ROUTINE TESTING W REFLEX): HIV Screen 4th Generation wRfx: NONREACTIVE

## 2021-08-01 MED ORDER — DIATRIZOATE MEGLUMINE & SODIUM 66-10 % PO SOLN
90.0000 mL | Freq: Once | ORAL | Status: AC
  Administered 2021-08-01: 90 mL via NASOGASTRIC
  Filled 2021-08-01: qty 90

## 2021-08-01 MED ORDER — PHENOL 1.4 % MT LIQD
1.0000 | OROMUCOSAL | Status: DC | PRN
  Administered 2021-08-01: 1 via OROMUCOSAL
  Filled 2021-08-01: qty 177

## 2021-08-01 MED ORDER — ONDANSETRON HCL 4 MG/2ML IJ SOLN
4.0000 mg | Freq: Four times a day (QID) | INTRAMUSCULAR | Status: DC | PRN
  Administered 2021-08-01 – 2021-08-03 (×7): 4 mg via INTRAVENOUS
  Filled 2021-08-01 (×8): qty 2

## 2021-08-01 MED ORDER — INSULIN ASPART 100 UNIT/ML IJ SOLN
0.0000 [IU] | INTRAMUSCULAR | Status: DC
  Administered 2021-08-01 – 2021-08-04 (×5): 1 [IU] via SUBCUTANEOUS

## 2021-08-01 MED ORDER — HEPARIN SODIUM (PORCINE) 5000 UNIT/ML IJ SOLN
5000.0000 [IU] | Freq: Three times a day (TID) | INTRAMUSCULAR | Status: DC
  Administered 2021-08-01 – 2021-08-03 (×7): 5000 [IU] via SUBCUTANEOUS
  Filled 2021-08-01 (×7): qty 1

## 2021-08-01 MED ORDER — MORPHINE SULFATE (PF) 2 MG/ML IV SOLN
1.0000 mg | INTRAVENOUS | Status: DC | PRN
Start: 2021-08-01 — End: 2021-08-01
  Administered 2021-08-01 (×2): 1 mg via INTRAVENOUS
  Filled 2021-08-01 (×2): qty 1

## 2021-08-01 MED ORDER — HYDROMORPHONE HCL 1 MG/ML IJ SOLN
0.5000 mg | INTRAMUSCULAR | Status: DC | PRN
  Administered 2021-08-01 – 2021-08-04 (×12): 1 mg via INTRAVENOUS
  Filled 2021-08-01 (×13): qty 1

## 2021-08-01 NOTE — Progress Notes (Signed)
Pt currently has NG tube in place and unable to tolerate CPAP.  Pt will resume CPAP usage when NG tube is removed

## 2021-08-01 NOTE — Plan of Care (Signed)

## 2021-08-01 NOTE — Consult Note (Signed)
[facility] Consult Note  [name] [birth year]  [id number].    Requesting MD: Marzetta Board Chief Complaint/Reason for Consult: SBO  HPI:  [name] is a 40yo female PMH Chronic Biventricular Heart Failure (EF 30% on ECHO 11/12/20), HTN, and DM who presented to [facility] yesterday complaining of abdominal pain. States that she was in her normal health yesterday morning. She had a bowel movent and ate some chinese food. Around midday she developed diffuse crampy abdominal pain. No n/v at home, but she had one episode of emesis in the ED. Initially thought she had food poisoning. Due to persistent pain she decided to go to the ED.  In the ED she underwent CT scan which shows infraumbilical ventral hernia containing multiple small bowel loops, small bowel loops in the lower abdomen and pelvis are mildly dilated and fluid-filled, findings concerning for early/low grade small bowel obstruction.  Patient was admitted to the medical service. NG tube was placed. General surgery asked to see.  She has had no prior SBOs, and did not know she had a hernia.  Abdominal surgical history: c section Anticoagulants: none Nonsmoker Employment: teaches anatomy   Review of Systems  Gastrointestinal:  Positive for abdominal pain, constipation, nausea and vomiting.   All systems reviewed and otherwise negative except for as above  Family History  Problem Relation Age of Onset   Hyperlipidemia Mother    Hypertension Mother    Depression Father    Hyperlipidemia Father    Heart disease Father 66       MI   Hypertension Father    Cancer Father 44       prostate   Cancer Paternal Grandfather        prostate   Coronary artery disease Other    Prostate cancer Other    Cancer Maternal Grandfather        prostate    Past Medical History:  Diagnosis Date   Anemia    Asthma    Genital herpes    Gestational diabetes    Obesity    Pre-diabetes    Thyroid disease     Past  Surgical History:  Procedure Laterality Date   CESAREAN SECTION N/A 08/28/2020   Procedure: CESAREAN SECTION;  Surgeon: Brien Few, MD;  Location: [facility];  Service: Obstetrics;  Laterality: N/A;   DILATATION & CURETTAGE/HYSTEROSCOPY WITH MYOSURE N/A 09/24/2018   Procedure: [place] POLYPECTOMY;  Surgeon: Princess Bruins, MD;  Location: [facility];  Service: Gynecology;  Laterality: N/A;   KELOID EXCISION  2008   removed @[facility] ----- 10/2011   KELOID EXCISION  09/20/2018   behind right ear   SALIVARY GLAND SURGERY     removed seondary to cancer--dr [employer]    Social History:  reports that she has never smoked. She has never been exposed to tobacco smoke. She has never used smokeless tobacco. She reports current alcohol use. She reports that she does not use drugs.  Allergies:  Allergies  Allergen Reactions   Clomiphene Hives    Hives and lips swelling.    Medications Prior to Admission  Medication Sig Dispense Refill   albuterol (PROAIR HFA) 108 (90 Base) MCG/ACT inhaler Inhale 2 puffs into the lungs every 6 (six) hours as needed. (Patient taking differently: Inhale 2 puffs into the lungs every 6 (six) hours as needed for wheezing or shortness of breath.) 6.7 g 1   aspirin 81 MG chewable tablet Chew 81 mg by mouth daily.  calcium carbonate (TUMS - DOSED IN MG ELEMENTAL CALCIUM) 500 MG chewable tablet Chew 1-2 tablets by mouth daily as needed for indigestion or heartburn.     carvedilol (COREG) 3.125 MG tablet TAKE 1 TABLET (3.125 MG TOTAL) BY MOUTH TWO TIMES DAILY WITH A MEAL. (Patient taking differently: Take 3.125 mg by mouth 2 (two) times daily with a meal.) 60 tablet 5   dapagliflozin propanediol (FARXIGA) 10 MG TABS tablet TAKE 1 TABLET (10 MG TOTAL) BY MOUTH DAILY BEFORE BREAKFAST. (Patient taking differently: Take 10 mg by mouth daily before breakfast.) 30 tablet 11   diphenhydramine-acetaminophen (TYLENOL PM) 25-500 MG TABS tablet Take 1  tablet by mouth at bedtime as needed (sleep).     FLUoxetine (PROZAC) 20 MG capsule Take 1 capsule (20 mg total) by mouth daily. 90 capsule 1   furosemide (LASIX) 20 MG tablet Take 2 tablets (40 mg total) by mouth daily. 60 tablet 5   hydrALAZINE (APRESOLINE) 50 MG tablet TAKE 0.5 TABLETS (25 MG TOTAL) BY MOUTH EVERY 8 (EIGHT) HOURS. (Patient taking differently: Take 25 mg by mouth 3 (three) times daily.) 45 tablet 6   isosorbide dinitrate (ISORDIL) 10 MG tablet TAKE 1 TABLET (10 MG TOTAL) BY MOUTH THREE TIMES DAILY. (Patient taking differently: Take 10 mg by mouth 3 (three) times daily.) 90 tablet 5   ivabradine (CORLANOR) 7.5 MG TABS tablet Take 1 tablet (7.5 mg total) by mouth 2 (two) times daily with a meal. 60 tablet 11   potassium chloride SA (KLOR-CON) 20 MEQ tablet TAKE 1 TABLET (20 MEQ TOTAL) BY MOUTH DAILY. (Patient taking differently: Take 20 mEq by mouth daily.) 90 tablet 3   sacubitril-valsartan (ENTRESTO) 97-103 MG Take 1 tablet by mouth 2 (two) times daily. 60 tablet 11   spironolactone (ALDACTONE) 25 MG tablet TAKE 1 TABLET (25 MG TOTAL) BY MOUTH DAILY. (Patient taking differently: Take 25 mg by mouth daily.) 30 tablet 11   valACYclovir (VALTREX) 1000 MG tablet Take 1 tablet (1,000 mg total) by mouth daily. 90 tablet 3   FLUoxetine (PROZAC) 20 MG capsule TAKE 1 CAPSULE (20 MG TOTAL) BY MOUTH DAILY. (Patient not taking: Reported on 08/01/2021) 30 capsule 2   valACYclovir (VALTREX) 1000 MG tablet Take 1 tablet (1,000 mg total) by mouth daily. (Patient not taking: No sig reported) 90 tablet 1    Prior to Admission medications   Medication Sig Start Date End Date Taking? Authorizing Provider  albuterol (PROAIR HFA) 108 (90 Base) MCG/ACT inhaler Inhale 2 puffs into the lungs every 6 (six) hours as needed. Patient taking differently: Inhale 2 puffs into the lungs every 6 (six) hours as needed for wheezing or shortness of breath. 03/02/16  Yes Donato Schultz, DO  aspirin 81 MG  chewable tablet Chew 81 mg by mouth daily.   Yes [provider]  calcium carbonate (TUMS - DOSED IN MG ELEMENTAL CALCIUM) 500 MG chewable tablet Chew 1-2 tablets by mouth daily as needed for indigestion or heartburn.   Yes [provider]  carvedilol (COREG) 3.125 MG tablet TAKE 1 TABLET (3.125 MG TOTAL) BY MOUTH TWO TIMES DAILY WITH A MEAL. Patient taking differently: Take 3.125 mg by mouth 2 (two) times daily with a meal. 09/01/20 09/01/21 Yes Clegg, Amy D, NP  dapagliflozin propanediol (FARXIGA) 10 MG TABS tablet TAKE 1 TABLET (10 MG TOTAL) BY MOUTH DAILY BEFORE BREAKFAST. Patient taking differently: Take 10 mg by mouth daily before breakfast. 11/12/20 11/12/21 Yes Bensimhon, Bevelyn Buckles, MD  diphenhydramine-acetaminophen (TYLENOL  PM) 25-500 MG TABS tablet Take 1 tablet by mouth at bedtime as needed (sleep).   Yes [provider]  FLUoxetine (PROZAC) 20 MG capsule Take 1 capsule (20 mg total) by mouth daily. 04/11/21  Yes   furosemide (LASIX) 20 MG tablet Take 2 tablets (40 mg total) by mouth daily. 03/08/21  Yes Milford, Anderson Malta, FNP  hydrALAZINE (APRESOLINE) 50 MG tablet TAKE 0.5 TABLETS (25 MG TOTAL) BY MOUTH EVERY 8 (EIGHT) HOURS. Patient taking differently: Take 25 mg by mouth 3 (three) times daily. 10/05/20 10/05/21 Yes Bensimhon, Bevelyn Buckles, MD  isosorbide dinitrate (ISORDIL) 10 MG tablet TAKE 1 TABLET (10 MG TOTAL) BY MOUTH THREE TIMES DAILY. Patient taking differently: Take 10 mg by mouth 3 (three) times daily. 09/01/20 09/01/21 Yes Clegg, Amy D, NP  ivabradine (CORLANOR) 7.5 MG TABS tablet Take 1 tablet (7.5 mg total) by mouth 2 (two) times daily with a meal. 03/08/21  Yes Milford, Jessica M, FNP  potassium chloride SA (KLOR-CON) 20 MEQ tablet TAKE 1 TABLET (20 MEQ TOTAL) BY MOUTH DAILY. Patient taking differently: Take 20 mEq by mouth daily. 09/09/20 09/09/21 Yes Milford, Anderson Malta, FNP  sacubitril-valsartan (ENTRESTO) 97-103 MG Take 1 tablet by mouth 2 (two) times  daily. 02/10/21  Yes Bensimhon, Bevelyn Buckles, MD  spironolactone (ALDACTONE) 25 MG tablet TAKE 1 TABLET (25 MG TOTAL) BY MOUTH DAILY. Patient taking differently: Take 25 mg by mouth daily. 11/30/20 11/30/21 Yes Simmons, Brittainy M, PA-C  valACYclovir (VALTREX) 1000 MG tablet Take 1 tablet (1,000 mg total) by mouth daily. 09/23/19  Yes Seabron Spates R, DO  FLUoxetine (PROZAC) 20 MG capsule TAKE 1 CAPSULE (20 MG TOTAL) BY MOUTH DAILY. Patient not taking: Reported on 08/01/2021 09/01/20 09/01/21  Tonye Becket D, NP  valACYclovir (VALTREX) 1000 MG tablet Take 1 tablet (1,000 mg total) by mouth daily. Patient not taking: No sig reported 04/11/21     budesonide (PULMICORT FLEXHALER) 180 MCG/ACT inhaler Inhale 2 puffs into the lungs every 12 (twelve) hours. 07/07/20 11/12/20  Virl Diamond A, MD    Blood pressure (!) 150/98, pulse 94, temperature 98 F (36.7 C), temperature source Oral, resp. rate 18, height 5\' 8"  (1.727 m), weight 129.8 kg, SpO2 95 %, not currently breastfeeding. Physical Exam: General: pleasant, WD/WN female who is laying in bed in NAD HEENT: head is normocephalic, atraumatic.  Sclera are noninjected.  Pupils equal and round.  Ears and nose without any masses or lesions.  Mouth is pink and moist. Dentition fair Heart: regular, rate, and rhythm.  Normal s1,s2. No obvious murmurs, gallops, or rubs noted.  Palpable pedal pulses bilaterally  Lungs: CTAB, no wheezes, rhonchi, or rales noted.  Respiratory effort nonlabored Abd: soft, mild right sided abdominal TTP without rebound or guarding, few bowel sounds heard, no masses or organomegaly. Unable to feel known infraumbilical hernia - nontender in this area MS: no BUE/BLE edema, calves soft and nontender Skin: warm and dry with no masses, lesions, or rashes Psych: A&Ox4 with an appropriate affect Neuro: cranial nerves grossly intact, equal strength in BUE/BLE bilaterally, normal speech, thought process intact  Results for orders placed  or performed during the hospital encounter of 07/31/21 (from the past 48 hour(s))  Urinalysis, Routine w reflex microscopic Urine, Clean Catch     Status: Abnormal   Collection Time: 07/31/21  7:10 PM  Result Value Ref Range   Color, Urine YELLOW YELLOW   APPearance CLEAR CLEAR   Specific Gravity, Urine 1.022 1.005 - 1.030  pH 7.0 5.0 - 8.0   Glucose, UA NEGATIVE NEGATIVE mg/dL   Hgb urine dipstick NEGATIVE NEGATIVE   Bilirubin Urine NEGATIVE NEGATIVE   Ketones, ur NEGATIVE NEGATIVE mg/dL   Protein, ur NEGATIVE NEGATIVE mg/dL   Nitrite NEGATIVE NEGATIVE   Leukocytes,Ua NEGATIVE NEGATIVE   RBC / HPF 0-5 0 - 5 RBC/hpf   WBC, UA 0-5 0 - 5 WBC/hpf   Bacteria, UA RARE (A) NONE SEEN   Squamous Epithelial / LPF 0-5 0 - 5   Mucus PRESENT     Comment: Performed at [facility], [address], [place], [place] [address]  Pregnancy, urine     Status: None   Collection Time: 07/31/21  7:10 PM  Result Value Ref Range   Preg Test, Ur NEGATIVE NEGATIVE    Comment:        THE SENSITIVITY OF THIS METHODOLOGY IS >20 mIU/mL. Performed at [facility], [address], [place], [place] [address]   Lipase, blood     Status: None   Collection Time: 07/31/21  7:11 PM  Result Value Ref Range   Lipase 12 11 - 51 U/L    Comment: Performed at [facility], [address] [place], [place] [address]  Comprehensive metabolic panel     Status: Abnormal   Collection Time: 07/31/21  7:11 PM  Result Value Ref Range   Sodium 140 135 - 145 mmol/L   Potassium 3.5 3.5 - 5.1 mmol/L   Chloride 101 98 - 111 mmol/L   CO2 30 22 - 32 mmol/L   Glucose, Bld 107 (H) 70 - 99 mg/dL    Comment: Glucose reference range applies only to samples taken after fasting for at least 8 hours.   BUN 7 6 - 20 mg/dL   Creatinine, Ser 0.77 0.44 - 1.00 mg/dL   Calcium 9.3 8.9 - 10.3 mg/dL   Total Protein 7.5 6.5 - 8.1 g/dL   Albumin 3.9 3.5 - 5.0 g/dL   AST 9 (L) 15 - 41  U/L   ALT 8 0 - 44 U/L   Alkaline Phosphatase 137 (H) 38 - 126 U/L   Total Bilirubin 0.6 0.3 - 1.2 mg/dL   GFR, Estimated >60 >60 mL/min    Comment: (NOTE) Calculated using the CKD-EPI Creatinine Equation (2021)    Anion gap 9 5 - 15    Comment: Performed at [facility], [place], [place] [address]  CBC     Status: Abnormal   Collection Time: 07/31/21  7:11 PM  Result Value Ref Range   WBC 16.1 (H) 4.0 - 10.5 K/uL   RBC 5.42 (H) 3.87 - 5.11 MIL/uL   Hemoglobin 12.3 12.0 - 15.0 g/dL   HCT 39.8 36.0 - 46.0 %   MCV 73.4 (L) 80.0 - 100.0 fL   MCH 22.7 (L) 26.0 - 34.0 pg   MCHC 30.9 30.0 - 36.0 g/dL   RDW 16.0 (H) 11.5 - 15.5 %   Platelets 304 150 - 400 K/uL   nRBC 0.0 0.0 - 0.2 %    Comment: Performed at [facility], [address] [place], [place] [address]  Resp Panel by RT-PCR (Flu A&B, Covid) Nasopharyngeal Swab     Status: None   Collection Time: 07/31/21 10:50 PM   Specimen: Nasopharyngeal Swab; Nasopharyngeal(NP) swabs in vial transport medium  Result Value Ref Range   SARS Coronavirus 2 by RT PCR NEGATIVE NEGATIVE    Comment: (NOTE) SARS-CoV-2 target nucleic  acids are NOT DETECTED.  The SARS-CoV-2 RNA is generally detectable in upper respiratory specimens during the acute phase of infection. The lowest concentration of SARS-CoV-2 viral copies this assay can detect is 138 copies/mL. A negative result does not preclude SARS-Cov-2 infection and should not be used as the sole basis for treatment or other patient management decisions. A negative result may occur with  improper specimen collection/handling, submission of specimen other than nasopharyngeal swab, presence of viral mutation(s) within the areas targeted by this assay, and inadequate number of viral copies(<138 copies/mL). A negative result must be combined with clinical observations, patient history, and epidemiological information. The expected result is  Negative.  Fact Sheet for Patients:  EntrepreneurPulse.com.au  Fact Sheet for Healthcare Providers:  IncredibleEmployment.be  This test is no t yet approved or cleared by the [place] FDA and  has been authorized for detection and/or diagnosis of SARS-CoV-2 by FDA under an Emergency Use Authorization (EUA). This EUA will remain  in effect (meaning this test can be used) for the duration of the COVID-19 declaration under Section 564(b)(1) of the Act, 21 U.S.C.section 360bbb-3(b)(1), unless the authorization is terminated  or revoked sooner.       Influenza A by PCR NEGATIVE NEGATIVE   Influenza B by PCR NEGATIVE NEGATIVE    Comment: (NOTE) The Xpert Xpress SARS-CoV-2/FLU/RSV plus assay is intended as an aid in the diagnosis of influenza from Nasopharyngeal swab specimens and should not be used as a sole basis for treatment. Nasal washings and aspirates are unacceptable for Xpert Xpress SARS-CoV-2/FLU/RSV testing.  Fact Sheet for Patients: EntrepreneurPulse.com.au  Fact Sheet for Healthcare Providers: IncredibleEmployment.be  This test is not yet approved or cleared by the [place] FDA and has been authorized for detection and/or diagnosis of SARS-CoV-2 by FDA under an Emergency Use Authorization (EUA). This EUA will remain in effect (meaning this test can be used) for the duration of the COVID-19 declaration under Section 564(b)(1) of the Act, 21 U.S.C. section 360bbb-3(b)(1), unless the authorization is terminated or revoked.  Performed at [facility], [address], [place], [place] [address]   Hemoglobin A1c     Status: Abnormal   Collection Time: 08/01/21  3:19 AM  Result Value Ref Range   Hgb A1c MFr Bld 6.5 (H) 4.8 - 5.6 %    Comment: (NOTE) Pre diabetes:          5.7%-6.4%  Diabetes:              >6.4%  Glycemic control for   <7.0% adults with diabetes     Mean Plasma Glucose 139.85 mg/dL    Comment: Performed at [facility] [address], [place], [place] [address]  Lactic acid, plasma     Status: None   Collection Time: 08/01/21  3:19 AM  Result Value Ref Range   Lactic Acid, Venous 1.2 0.5 - 1.9 mmol/L    Comment: Performed at [facility], [place] [address] [place], [place] [id number]  Basic metabolic panel     Status: Abnormal   Collection Time: 08/01/21  3:19 AM  Result Value Ref Range   Sodium 139 135 - 145 mmol/L   Potassium 3.4 (L) 3.5 - 5.1 mmol/L   Chloride 101 98 - 111 mmol/L   CO2 27 22 - 32 mmol/L   Glucose, Bld 125 (H) 70 - 99 mg/dL    Comment: Glucose reference range applies only to samples taken after fasting for at least 8  hours.   BUN 7 6 - 20 mg/dL   Creatinine, Ser 0.57 0.44 - 1.00 mg/dL   Calcium 8.9 8.9 - 10.3 mg/dL   GFR, Estimated >60 >60 mL/min    Comment: (NOTE) Calculated using the CKD-EPI Creatinine Equation (2021)    Anion gap 11 5 - 15    Comment: Performed at [facility], [place] [address] [place], [place] [address]  Glucose, capillary     Status: Abnormal   Collection Time: 08/01/21  4:13 AM  Result Value Ref Range   Glucose-Capillary 140 (H) 70 - 99 mg/dL    Comment: Glucose reference range applies only to samples taken after fasting for at least 8 hours.  Glucose, capillary     Status: Abnormal   Collection Time: 08/01/21  7:53 AM  Result Value Ref Range   Glucose-Capillary 123 (H) 70 - 99 mg/dL    Comment: Glucose reference range applies only to samples taken after fasting for at least 8 hours.   DG Abd 1 View  Result Date: 08/01/2021 CLINICAL DATA:  Nasogastric tube placement EXAM: ABDOMEN - 1 VIEW COMPARISON:  None. FINDINGS: Nasogastric tube tip overlies the proximal to mid body of the stomach. Indeterminate bowel-gas pattern due to a paucity of intra-abdominal gas. No gross free intraperitoneal gas. IMPRESSION: Nasogastric tube tip within the  proximal to mid body of the stomach. Electronically Signed   By: Fidela Salisbury M.D.   On: 08/01/2021 03:31   CT ABDOMEN PELVIS W CONTRAST  Result Date: 07/31/2021 CLINICAL DATA:  Bowel obstruction suspected.  Abdominal pain EXAM: CT ABDOMEN AND PELVIS WITH CONTRAST TECHNIQUE: Multidetector CT imaging of the abdomen and pelvis was performed using the standard protocol following bolus administration of intravenous contrast. CONTRAST:  169mL OMNIPAQUE IOHEXOL 300 MG/ML  SOLN COMPARISON:  None. FINDINGS: Lower chest: No acute abnormality. Hepatobiliary: Low-density adjacent to the falciform ligament, likely focal fatty infiltration. Gallbladder unremarkable. Pancreas: No focal abnormality or ductal dilatation. Spleen: No focal abnormality.  Normal size. Adrenals/Urinary Tract: No adrenal abnormality. No focal renal abnormality. No stones or hydronephrosis. Urinary bladder is unremarkable. Stomach/Bowel: Infraumbilical ventral hernia containing small bowel loops. Prominent fluid-filled small bowel loops in the lower abdomen and pelvis concerning for low grade small bowel obstruction. Stomach is moderately distended with fluid. Large bowel grossly unremarkable. Vascular/Lymphatic: No evidence of aneurysm or adenopathy. Reproductive: Uterus and adnexa unremarkable. No mass. IUD within the uterus. Other: No free fluid or free air. Musculoskeletal: No acute bony abnormality. IMPRESSION: Infraumbilical ventral hernia containing multiple small bowel loops. Small bowel loops in the lower abdomen and pelvis are mildly dilated and fluid-filled. Findings concerning for early/low grade small bowel obstruction. Electronically Signed   By: Rolm Baptise M.D.   On: 07/31/2021 22:32      Assessment/Plan SBO Ventral hernia - Patient with prior c section and SBO seen on CT scan. Reviewed CT with MD. No role for acute surgical intervention. Will start small bowel obstruction protocol and follow closely.   ID - none VTE - sq  heparin FEN - IVF, NPO/NGT to LIWS Foley - none  Chronic Biventricular Heart Failure - followed by Dr. Haroldine Laws, EF 30% on ECHO 11/12/20 HTN DM OSA Obesity BMI 43.51  Wellington Hampshire, [facility] 08/01/2021, 10:02 AM Please see Amion for pager number during day hours 7:00am-4:30pm

## 2021-08-01 NOTE — Progress Notes (Signed)
Patient seen and examined this morning, admitted overnight, H&P reviewed and agree with the assessment and plan.  Pleasant 40 year old female with history of chronic systolic CHF with most recent EF 30% in February 2022, followed by heart failure clinic, OSA on CPAP, HTN, DM2, obesity class III, history of C-section comes to the hospital with abdominal pain, nausea and vomiting.  CT scan showed infraumbilical ventral hernia containing multiple small bowel loops.  Small bowel loops in the lower abdomen and pelvis are mildly dilated and fluid-filled, concerning for low-grade small bowel obstruction.  General surgery was consulted and she was admitted to the hospital.   She continues to have abdominal pain today, NG tube is bothering her.  She does not necessarily feel any better   Infraumbilical ventral hernia, complicated by small bowel obstruction-continue conservative management for now, awaiting general surgery input.  NG tube placed, continue.  For now avoid IV fluids for reasons as below  Chronic systolic CHF-most recent EF 30%.  She is followed in heart failure clinic.  She tells me that she has been off of her heart failure medication over the last 2 weeks as she was very busy and did not have time to sort and arrange her medications.  She reports that she is starting to gain some fluid, thus avoid IV fluids now.  Continue to monitor daily labs and volume status, if small bowel obstruction persists or if there is any signs of dehydration will initiate IV fluid  OSA-continue CPAP  Essential hypertension-strict n.p.o., monitor blood pressure  DM2-continue sliding scale  Obesity, class III-BMI 43, she would benefit from weight loss  Scheduled Meds:  heparin  5,000 Units Subcutaneous Q8H   insulin aspart  0-9 Units Subcutaneous Q4H   Continuous Infusions: PRN Meds:.morphine injection, ondansetron (ZOFRAN) IV  Windsor Zirkelbach M. Elvera Lennox, MD, PhD [facility]  Between 7 am - 7 pm you can  contact me via Amion (for emergencies) or Securechat (non urgent matters).  I am not available 7 pm - 7 am, please contact night coverage MD/APP via Amion

## 2021-08-01 NOTE — Progress Notes (Signed)
Contrast given. See mar for time. Patient vomitted 200cc @ 1410. NGT reconnected to suction.

## 2021-08-01 NOTE — H&P (Signed)
History and Physical    [name] UEA:[id number] DOB: Mar 29, 1981 DOA: 07/31/2021  PCP: Donato Schultz, DO Patient coming from: Home  Chief Complaint: Abdominal pain, vomiting  HPI: [name] is a 40 y.o. female with medical history significant of chronic systolic CHF (EF 47% per echo done in February 2022), OSA on CPAP, hypertension, type 2 diabetes, morbid obesity (BMI 43.51), history of C-section presented to the ED complaining of abdominal pain, nausea, and vomiting.  Not febrile.  Labs showing WBC 16.1 (chronically elevated), hemoglobin 12.3, platelet count 304k.  Sodium 140, potassium 3.5, chloride 101, bicarb 30, BUN 7, creatinine 0.7, glucose 107.  Lipase normal and no significant elevation of LFTs.  UA not suggestive of infection.  Urine pregnancy test negative.  COVID and influenza PCR negative.  CT showing infraumbilical ventral hernia containing multiple small bowel loops.  Small bowel loops in the lower abdomen and pelvis are mildly dilated and fluid-filled.  Findings concerning for early/low-grade small bowel obstruction. NG tube placed.  General surgery (Dr. Freida Busman) consulted and recommended conservative management.  Patient was given Dilaudid, Versed, Zofran, and 1 L LR bolus.  Patient states she ate around 11 AM yesterday and then by noon started having severe generalized abdominal pain.  No nausea or vomiting at home but did vomit here in the hospital.  She is having regular bowel movements and did have a bowel movement yesterday morning.  Reports prior surgical history of C-section but no other abdominal surgeries.  Denies history of abdominal hernia.  Denies cough, shortness of breath, or chest pain.  Review of Systems:  All systems reviewed and apart from history of presenting illness, are negative.  Past Medical History:  Diagnosis Date   Anemia    Asthma    Genital herpes    Gestational diabetes    Obesity    Pre-diabetes    Thyroid disease      Past Surgical History:  Procedure Laterality Date   CESAREAN SECTION N/A 08/28/2020   Procedure: CESAREAN SECTION;  Surgeon: Olivia Mackie, MD;  Location: [facility] OR;  Service: Obstetrics;  Laterality: N/A;   DILATATION & CURETTAGE/HYSTEROSCOPY WITH MYOSURE N/A 09/24/2018   Procedure: DILATATION & CURETTAGE/HYSTEROSCOPY WITH MYOSURE POLYPECTOMY;  Surgeon: [name], MD;  Location: [facility];  Service: Gynecology;  Laterality: N/A;   KELOID EXCISION  2008   removed @[facility] ----- 10/2011   KELOID EXCISION  09/20/2018   behind right ear   SALIVARY GLAND SURGERY     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     reports that she has never smoked. She has never been exposed to tobacco smoke. She has never used smokeless tobacco. She reports current alcohol use. She reports that she does not use drugs.  Allergies  Allergen Reactions   Clomiphene Hives    Hives and lips swelling.    Family History  Problem Relation Age of Onset   Hyperlipidemia Mother    Hypertension Mother    Depression Father    Hyperlipidemia Father    Heart disease Father 27       MI   Hypertension Father    Cancer Father 12       prostate   Cancer Paternal Grandfather        prostate   Coronary artery disease Other    Prostate cancer Other    Cancer Maternal Grandfather        prostate    Prior to Admission medications   Medication Sig Start Date End  Date Taking? Authorizing Provider  albuterol (PROAIR HFA) 108 (90 Base) MCG/ACT inhaler Inhale 2 puffs into the lungs every 6 (six) hours as needed. 03/02/16   Ann Held, DO  aspirin 81 MG chewable tablet Chew 81 mg by mouth daily.    [provider]  calcium carbonate (TUMS - DOSED IN MG ELEMENTAL CALCIUM) 500 MG chewable tablet Chew 1-2 tablets by mouth daily as needed for indigestion or heartburn.    [provider]  carvedilol (COREG) 3.125 MG tablet TAKE 1 TABLET (3.125 MG TOTAL) BY MOUTH TWO TIMES DAILY WITH A MEAL. 09/01/20  09/01/21  Clegg, Amy D, NP  dapagliflozin propanediol (FARXIGA) 10 MG TABS tablet TAKE 1 TABLET (10 MG TOTAL) BY MOUTH DAILY BEFORE BREAKFAST. 11/12/20 11/12/21  Bensimhon, Shaune Pascal, MD  diphenhydramine-acetaminophen (TYLENOL PM) 25-500 MG TABS tablet Take 1 tablet by mouth at bedtime as needed (sleep).    [provider]  FLUoxetine (PROZAC) 20 MG capsule TAKE 1 CAPSULE (20 MG TOTAL) BY MOUTH DAILY. 09/01/20 09/01/21  Clegg, Amy D, NP  FLUoxetine (PROZAC) 20 MG capsule Take 1 capsule (20 mg total) by mouth daily. 04/11/21     furosemide (LASIX) 20 MG tablet Take 2 tablets (40 mg total) by mouth daily. 03/08/21   Milford, Maricela Bo, FNP  hydrALAZINE (APRESOLINE) 50 MG tablet TAKE 0.5 TABLETS (25 MG TOTAL) BY MOUTH EVERY 8 (EIGHT) HOURS. 10/05/20 10/05/21  Bensimhon, Shaune Pascal, MD  isosorbide dinitrate (ISORDIL) 10 MG tablet TAKE 1 TABLET (10 MG TOTAL) BY MOUTH THREE TIMES DAILY. 09/01/20 09/01/21  Darrick Grinder D, NP  ivabradine (CORLANOR) 7.5 MG TABS tablet Take 1 tablet (7.5 mg total) by mouth 2 (two) times daily with a meal. 03/08/21   Milford, Maricela Bo, FNP  potassium chloride SA (KLOR-CON) 20 MEQ tablet TAKE 1 TABLET (20 MEQ TOTAL) BY MOUTH DAILY. 09/09/20 09/09/21  Rafael Bihari, FNP  sacubitril-valsartan (ENTRESTO) 97-103 MG Take 1 tablet by mouth 2 (two) times daily. 02/10/21   Bensimhon, Shaune Pascal, MD  spironolactone (ALDACTONE) 25 MG tablet TAKE 1 TABLET (25 MG TOTAL) BY MOUTH DAILY. 11/30/20 11/30/21  Consuelo Pandy, PA-C  valACYclovir (VALTREX) 1000 MG tablet Take 1 tablet (1,000 mg total) by mouth daily. 09/23/19   Ann Held, DO  valACYclovir (VALTREX) 1000 MG tablet Take 1 tablet (1,000 mg total) by mouth daily. 04/11/21     budesonide (PULMICORT FLEXHALER) 180 MCG/ACT inhaler Inhale 2 puffs into the lungs every 12 (twelve) hours. 07/07/20 11/12/20  Laurin Coder, MD    Physical Exam: Vitals:   07/31/21 2130 07/31/21 2247 07/31/21 2248 08/01/21 0054  BP: (!)  115/33  (!) 141/81 (!) 158/96  Pulse: 81 89 86 96  Resp: 17  18 18   Temp:    98 F (36.7 C)  TempSrc:    Oral  SpO2: 99% 100% 98% 97%  Weight:    129.8 kg  Height:    5\' 8"  (1.727 m)    Physical Exam Constitutional:      General: She is not in acute distress. HENT:     Head: Normocephalic and atraumatic.  Eyes:     Extraocular Movements: Extraocular movements intact.     Conjunctiva/sclera: Conjunctivae normal.  Cardiovascular:     Rate and Rhythm: Normal rate and regular rhythm.     Pulses: Normal pulses.  Pulmonary:     Effort: Pulmonary effort is normal. No respiratory distress.     Breath sounds: Normal breath sounds.  No wheezing or rales.  Abdominal:     General: Bowel sounds are normal. There is no distension.     Palpations: Abdomen is soft.     Tenderness: There is abdominal tenderness. There is no guarding or rebound.     Comments: Mild generalized tenderness to palpation  Musculoskeletal:        General: No swelling or tenderness.     Cervical back: Normal range of motion and neck supple.  Skin:    General: Skin is warm and dry.  Neurological:     General: No focal deficit present.     Mental Status: She is alert and oriented to person, place, and time.     Labs on Admission: I have personally reviewed following labs and imaging studies  CBC: Recent Labs  Lab 07/31/21 1911  WBC 16.1*  HGB 12.3  HCT 39.8  MCV 73.4*  PLT 123456   Basic Metabolic Panel: Recent Labs  Lab 07/31/21 1911  NA 140  K 3.5  CL 101  CO2 30  GLUCOSE 107*  BUN 7  CREATININE 0.77  CALCIUM 9.3   GFR: Estimated Creatinine Clearance: 133.3 mL/min (by C-G formula based on SCr of 0.77 mg/dL). Liver Function Tests: Recent Labs  Lab 07/31/21 1911  AST 9*  ALT 8  ALKPHOS 137*  BILITOT 0.6  PROT 7.5  ALBUMIN 3.9   Recent Labs  Lab 07/31/21 1911  LIPASE 12   No results for input(s): AMMONIA in the last 168 hours. Coagulation Profile: No results for input(s): INR,  PROTIME in the last 168 hours. Cardiac Enzymes: No results for input(s): CKTOTAL, CKMB, CKMBINDEX, TROPONINI in the last 168 hours. BNP (last 3 results) No results for input(s): PROBNP in the last 8760 hours. HbA1C: No results for input(s): HGBA1C in the last 72 hours. CBG: No results for input(s): GLUCAP in the last 168 hours. Lipid Profile: No results for input(s): CHOL, HDL, LDLCALC, TRIG, CHOLHDL, LDLDIRECT in the last 72 hours. Thyroid Function Tests: No results for input(s): TSH, T4TOTAL, FREET4, T3FREE, THYROIDAB in the last 72 hours. Anemia Panel: No results for input(s): VITAMINB12, FOLATE, FERRITIN, TIBC, IRON, RETICCTPCT in the last 72 hours. Urine analysis:    Component Value Date/Time   COLORURINE YELLOW 07/31/2021 1910   APPEARANCEUR CLEAR 07/31/2021 1910   LABSPEC 1.022 07/31/2021 1910   PHURINE 7.0 07/31/2021 1910   GLUCOSEU NEGATIVE 07/31/2021 1910   HGBUR NEGATIVE 07/31/2021 1910   HGBUR negative 11/10/2010 1038   BILIRUBINUR NEGATIVE 07/31/2021 1910   BILIRUBINUR neg 03/03/2016 1043   KETONESUR NEGATIVE 07/31/2021 1910   PROTEINUR NEGATIVE 07/31/2021 1910   UROBILINOGEN 0.2 03/03/2016 1043   UROBILINOGEN negative 11/10/2010 1038   NITRITE NEGATIVE 07/31/2021 1910   LEUKOCYTESUR NEGATIVE 07/31/2021 1910    Radiological Exams on Admission: CT ABDOMEN PELVIS W CONTRAST  Result Date: 07/31/2021 CLINICAL DATA:  Bowel obstruction suspected.  Abdominal pain EXAM: CT ABDOMEN AND PELVIS WITH CONTRAST TECHNIQUE: Multidetector CT imaging of the abdomen and pelvis was performed using the standard protocol following bolus administration of intravenous contrast. CONTRAST:  132mL OMNIPAQUE IOHEXOL 300 MG/ML  SOLN COMPARISON:  None. FINDINGS: Lower chest: No acute abnormality. Hepatobiliary: Low-density adjacent to the falciform ligament, likely focal fatty infiltration. Gallbladder unremarkable. Pancreas: No focal abnormality or ductal dilatation. Spleen: No focal  abnormality.  Normal size. Adrenals/Urinary Tract: No adrenal abnormality. No focal renal abnormality. No stones or hydronephrosis. Urinary bladder is unremarkable. Stomach/Bowel: Infraumbilical ventral hernia containing small bowel loops. Prominent fluid-filled small bowel  loops in the lower abdomen and pelvis concerning for low grade small bowel obstruction. Stomach is moderately distended with fluid. Large bowel grossly unremarkable. Vascular/Lymphatic: No evidence of aneurysm or adenopathy. Reproductive: Uterus and adnexa unremarkable. No mass. IUD within the uterus. Other: No free fluid or free air. Musculoskeletal: No acute bony abnormality. IMPRESSION: Infraumbilical ventral hernia containing multiple small bowel loops. Small bowel loops in the lower abdomen and pelvis are mildly dilated and fluid-filled. Findings concerning for early/low grade small bowel obstruction. Electronically Signed   By: Rolm Baptise M.D.   On: 07/31/2021 22:32    Assessment/Plan Principal Problem:   Small bowel obstruction (HCC) Active Problems:   Essential hypertension   OSA (obstructive sleep apnea)   Abdominal hernia   CHF (congestive heart failure) ([place])   Infraumbilical ventral hernia complicated by small bowel obstruction Patient here with severe generalized abdominal pain and vomiting.  Surgical history of C-section.  CT showing infraumbilical ventral hernia containing multiple small bowel loops.  Small bowel loops in the lower abdomen and pelvis are mildly dilated and fluid-filled.  Findings concerning for early/low-grade small bowel obstruction.  Labs showing chronic stable leukocytosis.  No fever or signs of sepsis. -NG tube placed.  General surgery recommending conservative management.  Bowel rest, IV fluids, pain management, monitor electrolytes, and serial abdominal exams.  Check lactic acid level.  Chronic systolic CHF EF [id number] per echo done in February 2022.  No signs of volume overload at this  time. -Resume home meds after pharmacy med rec is done.  OSA -Continue nightly CPAP  Hypertension Systolic currently in the 140s to 150s. -Resume home meds after pharmacy med rec is done.  Noninsulin-dependent type 2 diabetes -Check A1c.  Sliding scale insulin sensitive every 4 hours.  DVT prophylaxis: Subcutaneous heparin Code Status: Full code Family Communication: No family available at this time. Disposition Plan: Status is: Inpatient  Remains inpatient appropriate because: SBO requiring bowel rest, IV fluid hydration, IV analgesic  Level of care: Level of care: Med-Surg  The medical decision making on this patient was of high complexity and the patient is at high risk for clinical deterioration, therefore this is a level 3 visit.  Shela Leff MD [facility]  If 7PM-7AM, please contact night-coverage [email or web address]  08/01/2021, 2:32 AM

## 2021-08-02 ENCOUNTER — Inpatient Hospital Stay (HOSPITAL_COMMUNITY): Payer: BC Managed Care – PPO

## 2021-08-02 DIAGNOSIS — Z01810 Encounter for preprocedural cardiovascular examination: Secondary | ICD-10-CM

## 2021-08-02 DIAGNOSIS — K56609 Unspecified intestinal obstruction, unspecified as to partial versus complete obstruction: Secondary | ICD-10-CM | POA: Diagnosis not present

## 2021-08-02 DIAGNOSIS — I5042 Chronic combined systolic (congestive) and diastolic (congestive) heart failure: Secondary | ICD-10-CM

## 2021-08-02 LAB — GLUCOSE, CAPILLARY
Glucose-Capillary: 113 mg/dL — ABNORMAL HIGH (ref 70–99)
Glucose-Capillary: 110 mg/dL — ABNORMAL HIGH (ref 70–99)
Glucose-Capillary: 95 mg/dL (ref 70–99)
Glucose-Capillary: 99 mg/dL (ref 70–99)
Glucose-Capillary: 94 mg/dL (ref 70–99)
Glucose-Capillary: 108 mg/dL — ABNORMAL HIGH (ref 70–99)

## 2021-08-02 NOTE — Progress Notes (Signed)
PROGRESS NOTE  [name] KGY:[id number] DOB: 17-Sep-1981 DOA: 07/31/2021 PCP: Donato Schultz, DO   LOS: 1 day   Brief Narrative / Interim history: 40 year old female with history of chronic systolic CHF with most recent EF 30% in February 2022, followed by heart failure clinic, OSA on CPAP, HTN, DM2, obesity class III, history of C-section comes to the hospital with abdominal pain, nausea and vomiting.  CT scan showed infraumbilical ventral hernia containing multiple small bowel loops.  Small bowel loops in the lower abdomen and pelvis are mildly dilated and fluid-filled, concerning for low-grade small bowel obstruction.  General surgery was consulted and she was admitted to the hospital.  Subjective / 24h Interval events: Feeling perhaps a little bit better but still is quite uncomfortable.  Has not passed any gas and had not had any bowel movements  Assessment & Plan: Principal Problem Infraumbilical ventral hernia, complicated by small bowel obstruction-continue conservative management for now, awaiting general surgery input.  NG tube placed, continue.  For now avoid IV fluids for reasons as below -Discussed with surgery, continue conservative management  Active Problems Chronic systolic CHF-most recent EF 30%.  She is followed in heart failure clinic.  She tells me that she has been off of her heart failure medication over the last 2 weeks as she was very busy and did not have time to sort and arrange her medications.  She reports that she is starting to gain some fluid, thus avoid IV fluids now.  Continue to monitor daily labs and volume status, if small bowel obstruction persists or if there is any signs of dehydration will initiate IV fluid -Discussed with general surgery, ID request cardiology consultation EKG will need to have surgery.  I have called cardiology as well, appreciate input   OSA-continue CPAP  Essential hypertension-strict n.p.o., monitor blood pressure    DM2-continue sliding scale   Obesity, class III-BMI 43, she would benefit from weight loss  Scheduled Meds:  heparin  5,000 Units Subcutaneous Q8H   insulin aspart  0-9 Units Subcutaneous Q4H   Continuous Infusions: PRN Meds:.HYDROmorphone (DILAUDID) injection, ondansetron (ZOFRAN) IV, phenol  Diet Orders (From admission, onward)     Start     Ordered   07/31/21 1906  Diet NPO time specified  Diet effective now        07/31/21 1905            DVT prophylaxis: heparin injection 5,000 Units Start: 08/01/21 0600     Code Status: Full Code  Family Communication: No family at bedside  Status is: Inpatient  Remains inpatient appropriate because: Persistent symptoms, NG tube  Level of care: Med-Surg  Consultants:  General surgery  Cardiology   Procedures:  none  Microbiology  none  Antimicrobials: none    Objective: Vitals:   08/01/21 0751 08/01/21 1406 08/01/21 2008 08/02/21 0451  BP: (!) 150/98 (!) 155/94 (!) 158/93 (!) 159/93  Pulse: 94 81 72 76  Resp: 18 18 18 17   Temp: 98 F (36.7 C) 98.2 F (36.8 C) 98.2 F (36.8 C) 98.5 F (36.9 C)  TempSrc: Oral Oral Oral Oral  SpO2: 95% 95% 97% 93%  Weight:      Height:        Intake/Output Summary (Last 24 hours) at 08/02/2021 1008 Last data filed at 08/02/2021 0600 Gross per 24 hour  Intake 120 ml  Output 1150 ml  Net -1030 ml   Filed Weights   08/01/21 0054  Weight: 129.8 kg  Examination: Constitutional: NAD Eyes: no scleral icterus ENMT: Mucous membranes are moist.  Neck: normal, supple Respiratory: clear to auscultation bilaterally, no wheezing, no crackles. Normal respiratory effort.  Cardiovascular: Regular rate and rhythm, no murmurs / rubs / gallops. No LE edema Abdomen: non distended, no tenderness. Bowel sounds positive.  Musculoskeletal: no clubbing / cyanosis.  Skin: no rashes Neurologic: non focal    Data Reviewed: I have independently reviewed following labs and imaging  studies  CBC: Recent Labs  Lab 07/31/21 1911  WBC 16.1*  HGB 12.3  HCT 39.8  MCV 73.4*  PLT 123456   Basic Metabolic Panel: Recent Labs  Lab 07/31/21 1911 08/01/21 0319  NA 140 139  K 3.5 3.4*  CL 101 101  CO2 30 27  GLUCOSE 107* 125*  BUN 7 7  CREATININE 0.77 0.57  CALCIUM 9.3 8.9   Liver Function Tests: Recent Labs  Lab 07/31/21 1911  AST 9*  ALT 8  ALKPHOS 137*  BILITOT 0.6  PROT 7.5  ALBUMIN 3.9   Coagulation Profile: No results for input(s): INR, PROTIME in the last 168 hours. HbA1C: Recent Labs    08/01/21 0319  HGBA1C 6.5*   CBG: Recent Labs  Lab 08/01/21 1556 08/01/21 2012 08/01/21 2348 08/02/21 0452 08/02/21 0735  GLUCAP 113* 106* 122* 113* 110*    Recent Results (from the past 240 hour(s))  Resp Panel by RT-PCR (Flu A&B, Covid) Nasopharyngeal Swab     Status: None   Collection Time: 07/31/21 10:50 PM   Specimen: Nasopharyngeal Swab; Nasopharyngeal(NP) swabs in vial transport medium  Result Value Ref Range Status   SARS Coronavirus 2 by RT PCR NEGATIVE NEGATIVE Final    Comment: (NOTE) SARS-CoV-2 target nucleic acids are NOT DETECTED.  The SARS-CoV-2 RNA is generally detectable in upper respiratory specimens during the acute phase of infection. The lowest concentration of SARS-CoV-2 viral copies this assay can detect is 138 copies/mL. A negative result does not preclude SARS-Cov-2 infection and should not be used as the sole basis for treatment or other patient management decisions. A negative result may occur with  improper specimen collection/handling, submission of specimen other than nasopharyngeal swab, presence of viral mutation(s) within the areas targeted by this assay, and inadequate number of viral copies(<138 copies/mL). A negative result must be combined with clinical observations, patient history, and epidemiological information. The expected result is Negative.  Fact Sheet for Patients:   EntrepreneurPulse.com.au  Fact Sheet for Healthcare Providers:  IncredibleEmployment.be  This test is no t yet approved or cleared by the [place] FDA and  has been authorized for detection and/or diagnosis of SARS-CoV-2 by FDA under an Emergency Use Authorization (EUA). This EUA will remain  in effect (meaning this test can be used) for the duration of the COVID-19 declaration under Section 564(b)(1) of the Act, 21 U.S.C.section 360bbb-3(b)(1), unless the authorization is terminated  or revoked sooner.       Influenza A by PCR NEGATIVE NEGATIVE Final   Influenza B by PCR NEGATIVE NEGATIVE Final    Comment: (NOTE) The Xpert Xpress SARS-CoV-2/FLU/RSV plus assay is intended as an aid in the diagnosis of influenza from Nasopharyngeal swab specimens and should not be used as a sole basis for treatment. Nasal washings and aspirates are unacceptable for Xpert Xpress SARS-CoV-2/FLU/RSV testing.  Fact Sheet for Patients: EntrepreneurPulse.com.au  Fact Sheet for Healthcare Providers: IncredibleEmployment.be  This test is not yet approved or cleared by the [place] FDA and has been authorized for detection and/or diagnosis  of SARS-CoV-2 by FDA under an Emergency Use Authorization (EUA). This EUA will remain in effect (meaning this test can be used) for the duration of the COVID-19 declaration under Section 564(b)(1) of the Act, 21 U.S.C. section 360bbb-3(b)(1), unless the authorization is terminated or revoked.  Performed at [facility], [address] [place], [place] [address]      Radiology Studies: DG Abd Portable 1V-Small Bowel Obstruction Protocol-initial, 8 hr delay  Result Date: 08/01/2021 CLINICAL DATA:  Abdominal pain, follow up small bowel. EXAM: PORTABLE ABDOMEN - 1 VIEW COMPARISON:  Film from earlier in the same day. FINDINGS: Nasogastric catheter remains in place.  Some of the previously administered contrast lies in the gastric fundus. Contrast within the small bowel is difficult to evaluate due to patient's body habitus. The degree of small-bowel dilatation seen on prior CT is not appreciated possibly related to fluid-filled bowel loops. No free air is noted. IMPRESSION: Administered contrast is not well evaluated distally. No obstructive pattern is noted although previously dilated loops of small bowel were fluid-filled and likely difficult to visualized on plain film. Electronically Signed   By: Alcide Clever M.D.   On: 08/01/2021 21:27    Pamella Pert, MD, PhD [facility]  Between 7 am - 7 pm I am available, please contact me via Amion (for emergencies) or Securechat (non urgent messages)  Between 7 pm - 7 am I am not available, please contact night coverage MD/APP via Amion

## 2021-08-02 NOTE — Consult Note (Addendum)
Cardiology Consultation:   Patient ID: [name] MRN: [id number]; DOB: May 31, 1981  Admit date: 07/31/2021 Date of Consult: 08/02/2021  PCP:  Ann Held, DO   [facility] Providers Cardiologist:  Skeet Latch, MD  Advanced Heart Failure:  Glori Bickers, MD  {   Patient Profile:   [name] is a 40 y.o. female with a hx of chronic systolic failure with improved LVEF,  HTN, DM, OSA on CPAP, morbid obesity who is being seen 08/02/2021 for the evaluation of surgical clearance at the request of Dr. Cruzita Lederer.  Admitted 12/21 at [redacted] weeks pregnant, with first baby, with hypertension and lower extremity edema and found to have acute systolic heart failure. Echo completed and showed biventricular dysfunction with EF 15-20%. CO-OX consistent with cardiogenic shock.  Diuresed over 50 pounds and GDMT initiated.  Recommended reliable birth control & avoidance of future pregnancy. She has an IUD in place.  Echo 11/12/2020 showed improved LVEF to 30%. Normal RV function and size.   Last seen by Dr. Haroldine Laws 03/29/2021. Bedside echo in clinic with EF ~45%. Encouraged to wear CPAP to help with fatigue. HR was 60. Reduced ivabradine to 5mg  BID.   History of Present Illness:   Ms. Schiffman presented to ER 07/31/2021 around 7 pm for acute onset abdominal pain, nausea and vomiting. Work up reveled infraumbilical ventral hernia containing multiple small bowel loops.  Findings concerning for early/low-grade small bowel obstruction. NG tube placed. Seen by surgery and conservative management recommended. She has remained NPO. No cardiac meds. No fluids given due to hx of CHF. Not passing gas. No BM yet. Plan for possible robotic repair. Cardiology is asked for surgical clearance.   The patient denies fever, chest pain, palpitations, shortness of breath, orthopnea, PND, dizziness, syncope, cough, congestion,melena or lower extremity edema. Reports not taking his cardiac  medications for "past 2 weeks" and said " I need to organize my medications". No clear explanation.    Past Medical History:  Diagnosis Date   Anemia    Asthma    Genital herpes    Gestational diabetes    Obesity    Pre-diabetes    Thyroid disease     Past Surgical History:  Procedure Laterality Date   CESAREAN SECTION N/A 08/28/2020   Procedure: CESAREAN SECTION;  Surgeon: Brien Few, MD;  Location: [facility] [place];  Service: Obstetrics;  Laterality: N/A;   DILATATION & CURETTAGE/HYSTEROSCOPY WITH MYOSURE N/A 09/24/2018   Procedure: [place] POLYPECTOMY;  Surgeon: Princess Bruins, MD;  Location: [facility];  Service: Gynecology;  Laterality: N/A;   KELOID EXCISION  2008   removed @[facility] ----- 10/2011   KELOID EXCISION  09/20/2018   behind right ear   SALIVARY GLAND SURGERY     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     Inpatient Medications: Scheduled Meds:  heparin  5,000 Units Subcutaneous Q8H   insulin aspart  0-9 Units Subcutaneous Q4H   Continuous Infusions:  PRN Meds: HYDROmorphone (DILAUDID) injection, ondansetron (ZOFRAN) IV, phenol  Allergies:    Allergies  Allergen Reactions   Clomiphene Hives    Hives and lips swelling.    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  Employer: [employer]  Tobacco Use   Smoking status: Never    Passive exposure: Never   Smokeless tobacco: Never  Vaping Use   Vaping Use: Never used  Substance and Sexual Activity   Alcohol use: Yes    Comment: SOCIAL    Drug use: No   Sexual activity: Yes    Partners: Male    Birth control/protection: None    Comment: 1st intercourse- 61, partners- 31, current partner- 4 yrs   Other Topics Concern   Not on file   Social History Narrative   Not on file   Social Determinants of Health   Financial Resource Strain: Not on file  Food Insecurity: Not on file  Transportation Needs: Not on file  Physical Activity: Not on file  Stress: Not on file  Social Connections: Not on file  Intimate Partner Violence: Not on file    Family History:    Family History  Problem Relation Age of Onset   Hyperlipidemia Mother    Hypertension Mother    Depression Father    Hyperlipidemia Father    Heart disease Father 105       MI   Hypertension Father    Cancer Father 41       prostate   Cancer Paternal Grandfather        prostate   Coronary artery disease Other    Prostate cancer Other    Cancer Maternal Grandfather        prostate     ROS:  Please see the history of present illness.  All other ROS reviewed and negative.     Physical Exam/Data:   Vitals:   08/01/21 0751 08/01/21 1406 08/01/21 2008 08/02/21 0451  BP: (!) 150/98 (!) 155/94 (!) 158/93 (!) 159/93  Pulse: 94 81 72 76  Resp: 18 18 18 17   Temp: 98 F (36.7 C) 98.2 F (36.8 C) 98.2 F (36.8 C) 98.5 F (36.9 C)  TempSrc: Oral Oral Oral Oral  SpO2: 95% 95% 97% 93%  Weight:      Height:        Intake/Output Summary (Last 24 hours) at 08/02/2021 1033 Last data filed at 08/02/2021 0600 Gross per 24 hour  Intake 120 ml  Output 1150 ml  Net -1030 ml   Last 3 Weights 08/01/2021 03/29/2021 03/08/2021  Weight (lbs) 286 lb 2.5 oz 275 lb 6.4 oz 269 lb 12.8 oz  Weight (kg) 129.8 kg 124.921 kg 122.38 kg     Body mass index is 43.51 kg/m.  General:  Obese female in no acute distress HEENT: normal Neck: no JVD Vascular: No carotid bruits; Distal pulses 2+ bilaterally Cardiac:  normal S1, S2; RRR; no murmur  Lungs:  clear to auscultation bilaterally, no wheezing, rhonchi or rales  Abd: TTP Ext: no edema Musculoskeletal:  No deformities, BUE and BLE strength normal and equal Skin: warm and dry  Neuro:  CNs 2-12 intact, no focal  abnormalities noted Psych:  Normal affect   EKG:  The EKG was personally reviewed and demonstrates:  Pending EKG Telemetry:  Telemetry was personally reviewed and demonstrates:  Not on tele   Relevant CV Studies:  Echo 11/12/2020  1. Left ventricular ejection fraction, by estimation, is 30%. The left  ventricle has moderately decreased function. The left ventricle has no  regional wall motion abnormalities. The left ventricular internal cavity  size was mildly dilated. Left  ventricular diastolic parameters were normal.   2. Right ventricular systolic function is normal. The  right ventricular  size is normal. Mildly increased right ventricular wall thickness.   3. Left atrial size was mildly dilated.   4. The mitral valve is normal in structure. Trivial mitral valve  regurgitation. No evidence of mitral stenosis.   5. The aortic valve is normal in structure. Aortic valve regurgitation is  not visualized. No aortic stenosis is present.   6. The inferior vena cava is normal in size with greater than 50%  respiratory variability, suggesting right atrial pressure of 3 mmHg.   Laboratory Data:  Chemistry Recent Labs  Lab 07/31/21 1911 08/01/21 0319  NA 140 139  K 3.5 3.4*  CL 101 101  CO2 30 27  GLUCOSE 107* 125*  BUN 7 7  CREATININE 0.77 0.57  CALCIUM 9.3 8.9  GFRNONAA >60 >60  ANIONGAP 9 11    Recent Labs  Lab 07/31/21 1911  PROT 7.5  ALBUMIN 3.9  AST 9*  ALT 8  ALKPHOS 137*  BILITOT 0.6    Hematology Recent Labs  Lab 07/31/21 1911  WBC 16.1*  RBC 5.42*  HGB 12.3  HCT 39.8  MCV 73.4*  MCH 22.7*  MCHC 30.9  RDW 16.0*  PLT 304    Radiology/Studies:  DG Abd 1 View  Result Date: 08/01/2021 CLINICAL DATA:  Nasogastric tube placement EXAM: ABDOMEN - 1 VIEW COMPARISON:  None. FINDINGS: Nasogastric tube tip overlies the proximal to mid body of the stomach. Indeterminate bowel-gas pattern due to a paucity of intra-abdominal gas. No gross free intraperitoneal  gas. IMPRESSION: Nasogastric tube tip within the proximal to mid body of the stomach. Electronically Signed   By: Helyn Numbers M.D.   On: 08/01/2021 03:31   CT ABDOMEN PELVIS W CONTRAST  Result Date: 07/31/2021 CLINICAL DATA:  Bowel obstruction suspected.  Abdominal pain EXAM: CT ABDOMEN AND PELVIS WITH CONTRAST TECHNIQUE: Multidetector CT imaging of the abdomen and pelvis was performed using the standard protocol following bolus administration of intravenous contrast. CONTRAST:  OMNIPAQUE IOHEXOL 300 MG/ML  SOLN COMPARISON:  None. FINDINGS: Lower chest: No acute abnormality. Hepatobiliary: Low-density adjacent to the falciform ligament, likely focal fatty infiltration. Gallbladder unremarkable. Pancreas: No focal abnormality or ductal dilatation. Spleen: No focal abnormality.  Normal size. Adrenals/Urinary Tract: No adrenal abnormality. No focal renal abnormality. No stones or hydronephrosis. Urinary bladder is unremarkable. Stomach/Bowel: Infraumbilical ventral hernia containing small bowel loops. Prominent fluid-filled small bowel loops in the lower abdomen and pelvis concerning for low grade small bowel obstruction. Stomach is moderately distended with fluid. Large bowel grossly unremarkable. Vascular/Lymphatic: No evidence of aneurysm or adenopathy. Reproductive: Uterus and adnexa unremarkable. No mass. IUD within the uterus. Other: No free fluid or free air. Musculoskeletal: No acute bony abnormality. IMPRESSION: Infraumbilical ventral hernia containing multiple small bowel loops. Small bowel loops in the lower abdomen and pelvis are mildly dilated and fluid-filled. Findings concerning for early/low grade small bowel obstruction. Electronically Signed   By: Charlett Nose M.D.   On: 07/31/2021 22:32   DG Abd Portable 1V-Small Bowel Obstruction Protocol-initial, 8 hr delay  Result Date: 08/01/2021 CLINICAL DATA:  Abdominal pain, follow up small bowel. EXAM: PORTABLE ABDOMEN - 1 VIEW COMPARISON:   Film from earlier in the same day. FINDINGS: Nasogastric catheter remains in place. Some of the previously administered contrast lies in the gastric fundus. Contrast within the small bowel is difficult to evaluate due to patient's body habitus. The degree of small-bowel dilatation seen on prior CT is not appreciated possibly related to fluid-filled bowel loops.  No free air is noted. IMPRESSION: Administered contrast is not well evaluated distally. No obstructive pattern is noted although previously dilated loops of small bowel were fluid-filled and likely difficult to visualized on plain film. Electronically Signed   By: Inez Catalina M.D.   On: 08/01/2021 21:27     Assessment and Plan:   Chronic systolic CHF - Echo [id number] severely reduced EF 15-20% while pregnant. Felt possible HTN CM. Recommended avoidance of future pregnancy. Has IUD. Repeat echo 11/12/20 with LVEF of 30%.  - Bedside echo during office visit 03/29/2021 showed improved LVEF to ~45%.  - Patient reports currently she is not taking cardiac meds for past 2 weeks. Currently she is NPO.  - At length discussion about importance of medications.  - Appears Euvolemic.  - Start meds when taking PO - Compliant with low sodium diet  2. Pre-op clearance  - She is getting at least 4 mets of activity. RCRI risk is 0.9%.  -Given past medical history and time since last visit, based on ACC/AHA guidelines, Kysha D Minniear would be at acceptable risk for the planned procedure without further cardiovascular testing.    3. OSA on CPAP - Encouraged compliance   Risk Assessment/Risk Scores:   New York Heart Association (NYHA) Functional Class NYHA Class II   For questions or updates, please contact [facility] Please consult [email or web address] for contact info under   Jarrett Soho, PA  08/02/2021 10:33 AM   Patient seen and examined and agree with Robbie Lis as detailed above.  In brief, the patient is a 41 year old female  with nonischemic cardiomyopathy with EF 15>30% on TTE (45% on bedside TTE 07/22) who presented to the hospital with abdominal pain, nausea and vomiting found to have ventral hernia with small bowel obstruction now planned for surgical intervention as has failed conservative management. Cardiology is consulted for preoperative evaluation.  Patient first diagnosed with acute systolic heart failure in pregnancy with TTE revealing EF 15-20% with biventricular failure. Developed cardiogenic shock at that time. Has followed with Dr. Sung Amabile and has done fairly well post-partum with improvement in LVEF to 30% in 10/2020 with GDMT. Per Dr. Letha Cape note in 03/2021, EF looked closer to 45%. She admits to intermittently taking her medications over the past 2 weeks due to difficulty finding time in the day to organize her meds with a newborn. Fortunately, she appears compensated from a volume standpoint without evidence of acute decompensated HF. Has not resumed her medications as she is NPO for bowel obstruction. Will obtain TTE to update LVEF. No ischemic testing needed at this time. Once post-op, we will add back GDMT as tolerated. Would be very cautious with fluid administration during procedure given risk of volume overload. We will follow post-op and titrate medications and monitor volume status as well.    GEN: Sitting in bed, NGT in place  Neck: No JVD Cardiac: RRR, no murmurs, rubs, or gallops.  Respiratory: Clear to auscultation bilaterally. GI: Obese, mildly tender to palpation MS: No edema; No deformity. Neuro:  Nonfocal  Psych: Normal affect    Plan: -No ischemic testing needed prior to OR -Will obtain limited TTE to update LVEF -Currently NPO for SBO; will need to add back GDMT for underlying HF once able to tolerate PO -Recommend judicious use of fluid administration during OR and post-op due to risk of acute on chronic HF exacerbation -We will follow post-op to monitor volume status and  optimize HF medications -Discussed importance of compliance  at length today and the patient understands  Laurance Flatten, MD

## 2021-08-02 NOTE — Progress Notes (Signed)
Patient ID: [name], female   DOB: [birth year], 40 y.o.   MRN: [id number] [facility] Surgery Progress Note     Subjective: CC-  Feels a little better today. States that she feels things moving down in her abdomen. No flatus or BM. NG with 1150cc out last 24 hours. Xray last night showed contrast in the stomach, difficult to see small bowel.  Objective: Vital signs in last 24 hours: Temp:  [98.2 F (36.8 C)-98.5 F (36.9 C)] 98.5 F (36.9 C) (11/15 0451) Pulse Rate:  [72-81] 76 (11/15 0451) Resp:  [17-18] 17 (11/15 0451) BP: (155-159)/(93-94) 159/93 (11/15 0451) SpO2:  [93 %-97 %] 93 % (11/15 0451) Last BM Date: 07/31/21  Intake/Output from previous day: 11/14 0701 - 11/15 0700 In: 120 [P.O.:120] Out: 1150 [Emesis/NG output:1150] Intake/Output this shift: No intake/output data recorded.  PE: Gen:  Alert, NAD, pleasant Card:  RRR Pulm:  rate and effort normal Abd: soft, mild right sided abdominal TTP without rebound or guarding, few bowel sounds heard, no masses or organomegaly. Unable to feel known infraumbilical hernia MS: no BUE/BLE edema, calves soft and nontender Psych: A&Ox3 Skin: no rashes noted, warm and dry  Lab Results:  Recent Labs    07/31/21 1911  WBC 16.1*  HGB 12.3  HCT 39.8  PLT 304   BMET Recent Labs    07/31/21 1911 08/01/21 0319  NA 140 139  K 3.5 3.4*  CL 101 101  CO2 30 27  GLUCOSE 107* 125*  BUN 7 7  CREATININE 0.77 0.57  CALCIUM 9.3 8.9   PT/INR No results for input(s): LABPROT, INR in the last 72 hours. CMP     Component Value Date/Time   NA 139 08/01/2021 0319   K 3.4 (L) 08/01/2021 0319   CL 101 08/01/2021 0319   CO2 27 08/01/2021 0319   GLUCOSE 125 (H) 08/01/2021 0319   BUN 7 08/01/2021 0319   CREATININE 0.57 08/01/2021 0319   CREATININE 0.80 09/02/2019 0914   CALCIUM 8.9 08/01/2021 0319   PROT 7.5 07/31/2021 1911   ALBUMIN 3.9 07/31/2021 1911   AST 9 (L) 07/31/2021 1911   ALT 8 07/31/2021 1911    ALKPHOS 137 (H) 07/31/2021 1911   BILITOT 0.6 07/31/2021 1911   GFRNONAA >60 08/01/2021 0319   GFRAA >60 09/19/2018 1315   Lipase     Component Value Date/Time   LIPASE 12 07/31/2021 1911       Studies/Results: DG Abd 1 View  Result Date: 08/01/2021 CLINICAL DATA:  Nasogastric tube placement EXAM: ABDOMEN - 1 VIEW COMPARISON:  None. FINDINGS: Nasogastric tube tip overlies the proximal to mid body of the stomach. Indeterminate bowel-gas pattern due to a paucity of intra-abdominal gas. No gross free intraperitoneal gas. IMPRESSION: Nasogastric tube tip within the proximal to mid body of the stomach. Electronically Signed   By: Helyn Numbers M.D.   On: 08/01/2021 03:31   CT ABDOMEN PELVIS W CONTRAST  Result Date: 07/31/2021 CLINICAL DATA:  Bowel obstruction suspected.  Abdominal pain EXAM: CT ABDOMEN AND PELVIS WITH CONTRAST TECHNIQUE: Multidetector CT imaging of the abdomen and pelvis was performed using the standard protocol following bolus administration of intravenous contrast. CONTRAST:  OMNIPAQUE IOHEXOL 300 MG/ML  SOLN COMPARISON:  None. FINDINGS: Lower chest: No acute abnormality. Hepatobiliary: Low-density adjacent to the falciform ligament, likely focal fatty infiltration. Gallbladder unremarkable. Pancreas: No focal abnormality or ductal dilatation. Spleen: No focal abnormality.  Normal size. Adrenals/Urinary Tract: No adrenal abnormality. No focal renal  abnormality. No stones or hydronephrosis. Urinary bladder is unremarkable. Stomach/Bowel: Infraumbilical ventral hernia containing small bowel loops. Prominent fluid-filled small bowel loops in the lower abdomen and pelvis concerning for low grade small bowel obstruction. Stomach is moderately distended with fluid. Large bowel grossly unremarkable. Vascular/Lymphatic: No evidence of aneurysm or adenopathy. Reproductive: Uterus and adnexa unremarkable. No mass. IUD within the uterus. Other: No free fluid or free air.  Musculoskeletal: No acute bony abnormality. IMPRESSION: Infraumbilical ventral hernia containing multiple small bowel loops. Small bowel loops in the lower abdomen and pelvis are mildly dilated and fluid-filled. Findings concerning for early/low grade small bowel obstruction. Electronically Signed   By: Charlett Nose M.D.   On: 07/31/2021 22:32   DG Abd Portable 1V-Small Bowel Obstruction Protocol-initial, 8 hr delay  Result Date: 08/01/2021 CLINICAL DATA:  Abdominal pain, follow up small bowel. EXAM: PORTABLE ABDOMEN - 1 VIEW COMPARISON:  Film from earlier in the same day. FINDINGS: Nasogastric catheter remains in place. Some of the previously administered contrast lies in the gastric fundus. Contrast within the small bowel is difficult to evaluate due to patient's body habitus. The degree of small-bowel dilatation seen on prior CT is not appreciated possibly related to fluid-filled bowel loops. No free air is noted. IMPRESSION: Administered contrast is not well evaluated distally. No obstructive pattern is noted although previously dilated loops of small bowel were fluid-filled and likely difficult to visualized on plain film. Electronically Signed   By: Alcide Clever M.D.   On: 08/01/2021 21:27    Anti-infectives: Anti-infectives (From admission, onward)    None        Assessment/Plan SBO Ventral hernia - started SBO protocol 11/14, no return in bowel function today. Will obtain 24 hour delayed film. Continue NG to LIWS. Mobilize.    ID - none VTE - sq heparin FEN - NPO/NGT to LIWS Foley - none   Chronic Biventricular Heart Failure - followed by Dr. Gala Romney, EF 30% on ECHO 11/12/20 HTN DM OSA Obesity BMI 43.51   LOS: 1 day    Franne Forts, [facility] Surgery 08/02/2021, 9:03 AM Please see Amion for pager number during day hours 7:00am-4:30pm

## 2021-08-02 NOTE — Progress Notes (Signed)
Pt still has NG tube in place and unable to tolerate CPAP usage.  RT will check again tomorrow night.

## 2021-08-03 ENCOUNTER — Inpatient Hospital Stay (HOSPITAL_COMMUNITY): Payer: BC Managed Care – PPO

## 2021-08-03 DIAGNOSIS — I5042 Chronic combined systolic (congestive) and diastolic (congestive) heart failure: Secondary | ICD-10-CM | POA: Diagnosis not present

## 2021-08-03 DIAGNOSIS — I5022 Chronic systolic (congestive) heart failure: Secondary | ICD-10-CM

## 2021-08-03 DIAGNOSIS — K56609 Unspecified intestinal obstruction, unspecified as to partial versus complete obstruction: Secondary | ICD-10-CM | POA: Diagnosis not present

## 2021-08-03 LAB — CBC
HCT: 46.2 % — ABNORMAL HIGH (ref 36.0–46.0)
Hemoglobin: 13.7 g/dL (ref 12.0–15.0)
MCH: 22.3 pg — ABNORMAL LOW (ref 26.0–34.0)
MCHC: 29.7 g/dL — ABNORMAL LOW (ref 30.0–36.0)
MCV: 75.2 fL — ABNORMAL LOW (ref 80.0–100.0)
Platelets: 325 10*3/uL (ref 150–400)
RBC: 6.14 MIL/uL — ABNORMAL HIGH (ref 3.87–5.11)
RDW: 17.2 % — ABNORMAL HIGH (ref 11.5–15.5)
WBC: 18.9 10*3/uL — ABNORMAL HIGH (ref 4.0–10.5)
nRBC: 0 % (ref 0.0–0.2)

## 2021-08-03 LAB — GLUCOSE, CAPILLARY
Glucose-Capillary: 121 mg/dL — ABNORMAL HIGH (ref 70–99)
Glucose-Capillary: 108 mg/dL — ABNORMAL HIGH (ref 70–99)
Glucose-Capillary: 97 mg/dL (ref 70–99)
Glucose-Capillary: 138 mg/dL — ABNORMAL HIGH (ref 70–99)
Glucose-Capillary: 106 mg/dL — ABNORMAL HIGH (ref 70–99)

## 2021-08-03 LAB — BASIC METABOLIC PANEL
Anion gap: 12 (ref 5–15)
BUN: 10 mg/dL (ref 6–20)
CO2: 26 mmol/L (ref 22–32)
Calcium: 9 mg/dL (ref 8.9–10.3)
Chloride: 101 mmol/L (ref 98–111)
Creatinine, Ser: 0.82 mg/dL (ref 0.44–1.00)
GFR, Estimated: >60 mL/min (ref >60)
Glucose, Bld: 117 mg/dL — ABNORMAL HIGH (ref 70–99)
Potassium: 3.6 mmol/L (ref 3.5–5.1)
Sodium: 139 mmol/L (ref 135–145)

## 2021-08-03 LAB — MAGNESIUM: Magnesium: 2.4 mg/dL (ref 1.7–2.4)

## 2021-08-03 LAB — ECHOCARDIOGRAM LIMITED
Calc EF: 41.7 %
Height: 68 in
S' Lateral: 4.3 cm
Single Plane A2C EF: 45.6 %
Single Plane A4C EF: 35 %
Weight: 4578.51 oz

## 2021-08-03 LAB — SURGICAL PCR SCREEN
MRSA, PCR: NEGATIVE
Staphylococcus aureus: NEGATIVE

## 2021-08-03 MED ORDER — CHLORHEXIDINE GLUCONATE CLOTH 2 % EX PADS
6.0000 | MEDICATED_PAD | Freq: Once | CUTANEOUS | Status: AC
  Administered 2021-08-04: 05:00:00 6 via TOPICAL

## 2021-08-03 MED ORDER — LACTATED RINGERS IV SOLN: INTRAVENOUS | Status: DC

## 2021-08-03 MED ORDER — CHLORHEXIDINE GLUCONATE CLOTH 2 % EX PADS
6.0000 | MEDICATED_PAD | Freq: Once | CUTANEOUS | Status: AC
  Administered 2021-08-03: 6 via TOPICAL

## 2021-08-03 MED ORDER — PERFLUTREN LIPID MICROSPHERE
1.0000 mL | INTRAVENOUS | Status: AC | PRN
Start: 2021-08-03 — End: 2021-08-03
  Administered 2021-08-03: 4 mL via INTRAVENOUS
  Filled 2021-08-03: qty 10

## 2021-08-03 MED ORDER — LACTATED RINGERS IV SOLN: Freq: Once | INTRAVENOUS | Status: AC

## 2021-08-03 NOTE — Progress Notes (Addendum)
Progress Note  Patient Name: DUSTIE BRITTLE Date of Encounter: 08/03/2021  [facility] Cardiologist: Chilton Si, MD   Subjective   No acute overnight events. Patient still having abdominal pain but no vomiting. She states she feels dehydrated and is wondering if she can have something to drink. No cardiac complaints - no chest pain, shortness of breath, or palpitations.  Inpatient Medications    Scheduled Meds:  heparin  5,000 Units Subcutaneous Q8H   insulin aspart  0-9 Units Subcutaneous Q4H   Continuous Infusions:  PRN Meds: HYDROmorphone (DILAUDID) injection, ondansetron (ZOFRAN) IV, phenol   Vital Signs    Vitals:   08/01/21 2008 08/02/21 0451 08/02/21 1944 08/03/21 0542  BP: (!) 158/93 (!) 159/93 (!) 171/92 (!) 144/85  Pulse: 72 76 98 86  Resp: 18 17 17 17   Temp: 98.2 F (36.8 C) 98.5 F (36.9 C) 99.2 F (37.3 C) 98.8 F (37.1 C)  TempSrc: Oral Oral    SpO2: 97% 93% 97% 92%  Weight:      Height:        Intake/Output Summary (Last 24 hours) at 08/03/2021 0720 Last data filed at 08/03/2021 08/05/2021 Gross per 24 hour  Intake --  Output 800 ml  Net -800 ml   Last 3 Weights 08/01/2021 03/29/2021 03/08/2021  Weight (lbs) 286 lb 2.5 oz 275 lb 6.4 oz 269 lb 12.8 oz  Weight (kg) 129.8 kg 124.921 kg 122.38 kg      Telemetry    Not on telemetry.  ECG    No new ECG tracing. - Personally Reviewed  Physical Exam   GEN: Obese African-American woman resting comfortably in no acute distress. NG tube present. Neck: No JVD. Cardiac: Borderline tachycardic with regular rhythm. No murmurs, rubs, or gallops. Radial pulses 2+ and equal bilaterally. Respiratory: Clear to auscultation bilaterally. No wheezes, rhonchi, or rales. GI: Soft, non-distended, and non-tender to very mild palpation.  MS: No lower extremity edema. No deformity. Skin: Warm and dry. Neuro:  No focal deficits. Psych: Normal affect. Responds appropriately.  Labs    High Sensitivity  Troponin:  No results for input(s): TROPONINIHS in the last 720 hours.   Chemistry Recent Labs  Lab 07/31/21 1911 08/01/21 0319 08/03/21 0325  NA 140 139 139  K 3.5 3.4* 3.6  CL 101 101 101  CO2 30 27 26   GLUCOSE 107* 125* 117*  BUN 7 7 10   CREATININE 0.77 0.57 0.82  CALCIUM 9.3 8.9 9.0  MG  --   --  2.4  PROT 7.5  --   --   ALBUMIN 3.9  --   --   AST 9*  --   --   ALT 8  --   --   ALKPHOS 137*  --   --   BILITOT 0.6  --   --   GFRNONAA >60 >60 >60  ANIONGAP 9 11 12     Lipids No results for input(s): CHOL, TRIG, HDL, LABVLDL, LDLCALC, CHOLHDL in the last 168 hours.  Hematology Recent Labs  Lab 07/31/21 1911 08/03/21 0325  WBC 16.1* 18.9*  RBC 5.42* 6.14*  HGB 12.3 13.7  HCT 39.8 46.2*  MCV 73.4* 75.2*  MCH 22.7* 22.3*  MCHC 30.9 29.7*  RDW 16.0* 17.2*  PLT 304 325   Thyroid No results for input(s): TSH, FREET4 in the last 168 hours.  BNPNo results for input(s): BNP, PROBNP in the last 168 hours.  DDimer No results for input(s): DDIMER in the last 168 hours.  Radiology    DG Abd Portable 1V  Result Date: 08/02/2021 CLINICAL DATA:  Small-bowel obstruction. EXAM: PORTABLE ABDOMEN - 1 VIEW COMPARISON:  Abdominal radiography done yesterday. FINDINGS: Orogastric or nasogastric tube is been removed. No dilated bowel is seen. I cannot appreciate the presence of any gastrointestinal contrast. IUD seen in the central pelvis. Ordinary lumbar degenerative changes are noted. IMPRESSION: One view abdominal image within normal limits. No evidence of bowel obstruction. Previously administered oral contrast is not visible. Electronically Signed   By: Nelson Chimes M.D.   On: 08/02/2021 10:33   DG Abd Portable 1V-Small Bowel Obstruction Protocol-initial, 8 hr delay  Result Date: 08/01/2021 CLINICAL DATA:  Abdominal pain, follow up small bowel. EXAM: PORTABLE ABDOMEN - 1 VIEW COMPARISON:  Film from earlier in the same day. FINDINGS: Nasogastric catheter remains in place. Some of  the previously administered contrast lies in the gastric fundus. Contrast within the small bowel is difficult to evaluate due to patient's body habitus. The degree of small-bowel dilatation seen on prior CT is not appreciated possibly related to fluid-filled bowel loops. No free air is noted. IMPRESSION: Administered contrast is not well evaluated distally. No obstructive pattern is noted although previously dilated loops of small bowel were fluid-filled and likely difficult to visualized on plain film. Electronically Signed   By: Inez Catalina M.D.   On: 08/01/2021 21:27    Cardiac Studies   Echocardiogram 11/12/2020: Impressions: 1. Left ventricular ejection fraction, by estimation, is 30%. The left  ventricle has moderately decreased function. The left ventricle has no  regional wall motion abnormalities. The left ventricular internal cavity  size was mildly dilated. Left  ventricular diastolic parameters were normal.   2. Right ventricular systolic function is normal. The right ventricular  size is normal. Mildly increased right ventricular wall thickness.   3. Left atrial size was mildly dilated.   4. The mitral valve is normal in structure. Trivial mitral valve  regurgitation. No evidence of mitral stenosis.   5. The aortic valve is normal in structure. Aortic valve regurgitation is  not visualized. No aortic stenosis is present.   6. The inferior vena cava is normal in size with greater than 50%  respiratory variability, suggesting right atrial pressure of 3 mmHg.   Patient Profile     40 y.o. 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 Cardiology consulted for pre-op evaluation at the request of Dr. Cruzita Lederer.  Assessment & Plan    Pre-Op Evaluation - Patient presented with infraumbilical ventral hernia containing multiple small bowel loops concerning for early/low-grade small bowel obstruction. Cardiology consulted for pre-op evaluation. - Patient stable from a cardiac standpoint. No angina or acute CHF symptoms. - Felt to be well optimized for surgery without any ischemic testing necessary. However, we are going to get an Echo just to reassess her EF.  Chronic Biventricular CHF - Most recent Echo in 10/2020 showed LVEF of 30% with normal wall motion and normal diastolic function. RV normal in size and function.  - Updated Echo pending.  - Appears euvolemic on exam. - Home medications include: Lasix 40mg  daily, Entresto 97-103mg  twice daily, Coreg 3.125mg  twice daily, Spironolactone 25mg  daily, Hydralazine 25mg  three times  daily, Isordil 10mg  three times daily, and Farxiga 10mg  daily. All medications currently on hold as she in NPO for small bowel obstruction. Will plan to restart these when able. - Recommend judicious use of fluid administration during perioperative period due to risk for CHF exacerbation. - Will continue to monitor volume status closely.  Hypertension - BP mildly elevated this morning. - Will restart medications for CHF as above when able to take PO.  Otherwise, per primary team: - Infraumbilical ventral hernia with small bowel obstruction - Diabetes - Obstructive sleep apnea: on CPAP - Leukocytosis  For questions or updates, please contact [facility] Please consult [email or web address] for contact info under        Signed, Darreld Mclean, PA-C  08/03/2021, 7:20 AM    Patient seen and examined and agree with Sande Rives, PA-C as detailed above.  In brief, the patient is a 40 year old female with nonischemic cardiomyopathy with EF 15>30% on TTE (45% on bedside TTE 07/22) who  presented to the hospital with abdominal pain, nausea and vomiting found to have ventral hernia with small bowel obstruction now planned for surgical intervention as has failed conservative management. Cardiology is consulted for preoperative evaluation.   Patient first diagnosed with acute systolic heart failure in pregnancy with TTE revealing EF 15-20% with biventricular failure. Developed cardiogenic shock at that time. Has followed with Dr. Sung Amabile and has done fairly well post-partum with improvement in LVEF to 30% in 10/2020 with GDMT. Per Dr. Letha Cape note in 03/2021, EF looked closer to 45%. Repeat TTE for EF pending here. She admits to intermittently taking her medications over the past 2 weeks. Fortunately, she appears compensated from a volume standpoint without evidence of acute decompensated HF. Has not resumed her medications as she is NPO for bowel obstruction.  Once post-op, we will add back GDMT as tolerated. Would be very cautious with fluid administration periprocedurally given risk of volume overload. We will follow post-op and titrate medications and monitor volume status as well.      GEN: Sitting up in bed, NGT in place Neck: No JVD Cardiac: RRR, no murmurs, rubs, or gallops.  Respiratory: Clear to auscultation bilaterally. GI: Obese, mildly tender to palpation MS: No edema; No deformity. Neuro:  Nonfocal  Psych: Normal affect     Plan: -No ischemic testing needed prior to OR -Follow-up limited TTE to reassess EF; will not delay surgery but help guide medication management post-op -Currently NPO for SBO; will need to add back GDMT for underlying HF once able to tolerate PO -Recommend judicious use of fluid administration during OR and post-op due to risk of acute on chronic HF exacerbation -We will follow post-op to monitor volume status and optimize HF medications   Gwyndolyn Kaufman, MD

## 2021-08-03 NOTE — Progress Notes (Signed)
The patient walked 2 laps around the entire hall.

## 2021-08-03 NOTE — Progress Notes (Signed)
Pt still has NG tube in place and wants to hold CPAP QHS.

## 2021-08-03 NOTE — Progress Notes (Addendum)
Assessment & Plan: SBO secondary to incarcerated ventral incisional hernia  No significant progress with bowel rest, NG decompression, NPO, IV hydration  Plan operative intervention 11/17 for reduction of hernia and repair Chronic Biventricular Heart Failure  Cardiology following  EF 30% on ECHO 11/12/20  ID - none VTE - sq heparin FEN - NPO/NGT to LIWS Foley - none   HTN DM OSA Obesity BMI 43.51  Discussion this morning with patient regarding lack of progress on current regimen.  Discussed need for operative intervention and challenges due to previous surgery and body size.  Plan to attempt laparoscopic approach, but may need to convert to open surgical repair.  Patient understands and agrees to proceed.  Plan OR tomorrow 11/17.  The risks and benefits of the procedure have been discussed at length with the patient.  The patient understands the proposed procedure, potential alternative treatments, and the course of recovery to be expected.  All of the patient's questions have been answered at this time.  The patient wishes to proceed with surgery.        [name], MD       [facility]       Office: [phone]   Chief Complaint: SBO, ventral incisional hernia  Subjective: Patient in bed, comfortable, wants to eat.  Would like a popsicle.  Objective: Vital signs in last 24 hours: Temp:  [98.8 F (37.1 C)-99.2 F (37.3 C)] 98.8 F (37.1 C) (11/16 0542) Pulse Rate:  [86-98] 86 (11/16 0542) Resp:  [17] 17 (11/16 0542) BP: (144-171)/(85-92) 144/85 (11/16 0542) SpO2:  [92 %-97 %] 92 % (11/16 0542) Last BM Date: 07/31/21  Intake/Output from previous day: 11/15 0701 - 11/16 0700 In: -  Out: 800 [Emesis/NG output:800] Intake/Output this shift: No intake/output data recorded.  Physical Exam: HEENT - sclerae clear, mucous membranes moist Neck - soft Chest - clear bilaterally Cor - RRR Abdomen - soft, obese, minimally tender; well-healed  Pfannenstiehl incision Neuro - alert & oriented, no focal deficits  Lab Results:  Recent Labs    07/31/21 1911 08/03/21 0325  WBC 16.1* 18.9*  HGB 12.3 13.7  HCT 39.8 46.2*  PLT 304 325   BMET Recent Labs    08/01/21 0319 08/03/21 0325  NA 139 139  K 3.4* 3.6  CL 101 101  CO2 27 26  GLUCOSE 125* 117*  BUN 7 10  CREATININE 0.57 0.82  CALCIUM 8.9 9.0   PT/INR No results for input(s): LABPROT, INR in the last 72 hours. Comprehensive Metabolic Panel:    Component Value Date/Time   NA 139 08/03/2021 0325   NA 139 08/01/2021 0319   K 3.6 08/03/2021 0325   K 3.4 (L) 08/01/2021 0319   CL 101 08/03/2021 0325   CL 101 08/01/2021 0319   CO2 26 08/03/2021 0325   CO2 27 08/01/2021 0319   BUN 10 08/03/2021 0325   BUN 7 08/01/2021 0319   CREATININE 0.82 08/03/2021 0325   CREATININE 0.57 08/01/2021 0319   CREATININE 0.80 09/02/2019 0914   GLUCOSE 117 (H) 08/03/2021 0325   GLUCOSE 125 (H) 08/01/2021 0319   CALCIUM 9.0 08/03/2021 0325   CALCIUM 8.9 08/01/2021 0319   AST 9 (L) 07/31/2021 1911   AST 27 08/25/2020 0328   ALT 8 07/31/2021 1911   ALT 33 08/25/2020 0328   ALKPHOS 137 (H) 07/31/2021 1911   ALKPHOS 190 (H) 08/25/2020 0328   BILITOT 0.6 07/31/2021 1911   BILITOT 1.6 (H) 08/25/2020 3875  PROT 7.5 07/31/2021 1911   PROT 6.7 08/25/2020 0328   ALBUMIN 3.9 07/31/2021 1911   ALBUMIN 2.7 (L) 08/25/2020 0328    Studies/Results: DG Abd Portable 1V  Result Date: 08/02/2021 CLINICAL DATA:  Small-bowel obstruction. EXAM: PORTABLE ABDOMEN - 1 VIEW COMPARISON:  Abdominal radiography done yesterday. FINDINGS: Orogastric or nasogastric tube is been removed. No dilated bowel is seen. I cannot appreciate the presence of any gastrointestinal contrast. IUD seen in the central pelvis. Ordinary lumbar degenerative changes are noted. IMPRESSION: One view abdominal image within normal limits. No evidence of bowel obstruction. Previously administered oral contrast is not visible.  Electronically Signed   By: Paulina Fusi M.D.   On: 08/02/2021 10:33   DG Abd Portable 1V-Small Bowel Obstruction Protocol-initial, 8 hr delay  Result Date: 08/01/2021 CLINICAL DATA:  Abdominal pain, follow up small bowel. EXAM: PORTABLE ABDOMEN - 1 VIEW COMPARISON:  Film from earlier in the same day. FINDINGS: Nasogastric catheter remains in place. Some of the previously administered contrast lies in the gastric fundus. Contrast within the small bowel is difficult to evaluate due to patient's body habitus. The degree of small-bowel dilatation seen on prior CT is not appreciated possibly related to fluid-filled bowel loops. No free air is noted. IMPRESSION: Administered contrast is not well evaluated distally. No obstructive pattern is noted although previously dilated loops of small bowel were fluid-filled and likely difficult to visualized on plain film. Electronically Signed   By: Alcide Clever M.D.   On: 08/01/2021 21:27      [name] 08/03/2021   Patient ID: [name], female   DOB: [birth year], 40 y.o.   MRN: [id number]

## 2021-08-03 NOTE — H&P (View-Only) (Signed)
Assessment & Plan: SBO secondary to incarcerated ventral incisional hernia  No significant progress with bowel rest, NG decompression, NPO, IV hydration  Plan operative intervention 11/17 for reduction of hernia and repair Chronic Biventricular Heart Failure  Cardiology following  EF 30% on ECHO 11/12/20  ID - none VTE - sq heparin FEN - NPO/NGT to LIWS Foley - none   HTN DM OSA Obesity BMI 43.51  Discussion this morning with patient regarding lack of progress on current regimen.  Discussed need for operative intervention and challenges due to previous surgery and body size.  Plan to attempt laparoscopic approach, but may need to convert to open surgical repair.  Patient understands and agrees to proceed.  Plan OR tomorrow 11/17.  The risks and benefits of the procedure have been discussed at length with the patient.  The patient understands the proposed procedure, potential alternative treatments, and the course of recovery to be expected.  All of the patient's questions have been answered at this time.  The patient wishes to proceed with surgery.        [name], MD       [facility]       Office: [phone]   Chief Complaint: SBO, ventral incisional hernia  Subjective: Patient in bed, comfortable, wants to eat.  Would like a popsicle.  Objective: Vital signs in last 24 hours: Temp:  [98.8 F (37.1 C)-99.2 F (37.3 C)] 98.8 F (37.1 C) (11/16 0542) Pulse Rate:  [86-98] 86 (11/16 0542) Resp:  [17] 17 (11/16 0542) BP: (144-171)/(85-92) 144/85 (11/16 0542) SpO2:  [92 %-97 %] 92 % (11/16 0542) Last BM Date: 07/31/21  Intake/Output from previous day: 11/15 0701 - 11/16 0700 In: -  Out: 800 [Emesis/NG output:800] Intake/Output this shift: No intake/output data recorded.  Physical Exam: HEENT - sclerae clear, mucous membranes moist Neck - soft Chest - clear bilaterally Cor - RRR Abdomen - soft, obese, minimally tender; well-healed  Pfannenstiehl incision Neuro - alert & oriented, no focal deficits  Lab Results:  Recent Labs    07/31/21 1911 08/03/21 0325  WBC 16.1* 18.9*  HGB 12.3 13.7  HCT 39.8 46.2*  PLT 304 325   BMET Recent Labs    08/01/21 0319 08/03/21 0325  NA 139 139  K 3.4* 3.6  CL 101 101  CO2 27 26  GLUCOSE 125* 117*  BUN 7 10  CREATININE 0.57 0.82  CALCIUM 8.9 9.0   PT/INR No results for input(s): LABPROT, INR in the last 72 hours. Comprehensive Metabolic Panel:    Component Value Date/Time   NA 139 08/03/2021 0325   NA 139 08/01/2021 0319   K 3.6 08/03/2021 0325   K 3.4 (L) 08/01/2021 0319   CL 101 08/03/2021 0325   CL 101 08/01/2021 0319   CO2 26 08/03/2021 0325   CO2 27 08/01/2021 0319   BUN 10 08/03/2021 0325   BUN 7 08/01/2021 0319   CREATININE 0.82 08/03/2021 0325   CREATININE 0.57 08/01/2021 0319   CREATININE 0.80 09/02/2019 0914   GLUCOSE 117 (H) 08/03/2021 0325   GLUCOSE 125 (H) 08/01/2021 0319   CALCIUM 9.0 08/03/2021 0325   CALCIUM 8.9 08/01/2021 0319   AST 9 (L) 07/31/2021 1911   AST 27 08/25/2020 0328   ALT 8 07/31/2021 1911   ALT 33 08/25/2020 0328   ALKPHOS 137 (H) 07/31/2021 1911   ALKPHOS 190 (H) 08/25/2020 0328   BILITOT 0.6 07/31/2021 1911   BILITOT 1.6 (H) 08/25/2020 3875  PROT 7.5 07/31/2021 1911   PROT 6.7 08/25/2020 0328   ALBUMIN 3.9 07/31/2021 1911   ALBUMIN 2.7 (L) 08/25/2020 0328    Studies/Results: DG Abd Portable 1V  Result Date: 08/02/2021 CLINICAL DATA:  Small-bowel obstruction. EXAM: PORTABLE ABDOMEN - 1 VIEW COMPARISON:  Abdominal radiography done yesterday. FINDINGS: Orogastric or nasogastric tube is been removed. No dilated bowel is seen. I cannot appreciate the presence of any gastrointestinal contrast. IUD seen in the central pelvis. Ordinary lumbar degenerative changes are noted. IMPRESSION: One view abdominal image within normal limits. No evidence of bowel obstruction. Previously administered oral contrast is not visible.  Electronically Signed   By: Paulina Fusi M.D.   On: 08/02/2021 10:33   DG Abd Portable 1V-Small Bowel Obstruction Protocol-initial, 8 hr delay  Result Date: 08/01/2021 CLINICAL DATA:  Abdominal pain, follow up small bowel. EXAM: PORTABLE ABDOMEN - 1 VIEW COMPARISON:  Film from earlier in the same day. FINDINGS: Nasogastric catheter remains in place. Some of the previously administered contrast lies in the gastric fundus. Contrast within the small bowel is difficult to evaluate due to patient's body habitus. The degree of small-bowel dilatation seen on prior CT is not appreciated possibly related to fluid-filled bowel loops. No free air is noted. IMPRESSION: Administered contrast is not well evaluated distally. No obstructive pattern is noted although previously dilated loops of small bowel were fluid-filled and likely difficult to visualized on plain film. Electronically Signed   By: Alcide Clever M.D.   On: 08/01/2021 21:27      [name] 08/03/2021   Patient ID: [name], female   DOB: May 31, 1981, 40 y.o.   MRN: [id number]

## 2021-08-03 NOTE — Progress Notes (Signed)
PROGRESS NOTE  TYRISHA BENNINGER ZOX:[id number] DOB: 07/19/1981 DOA: 07/31/2021 PCP: Donato Schultz, DO   LOS: 2 days   Brief Narrative / Interim history: 40 year old female with history of chronic systolic CHF with most recent EF 30% in February 2022, followed by heart failure clinic, OSA on CPAP, HTN, DM2, obesity class III, history of C-section comes to the hospital with abdominal pain, nausea and vomiting.  CT scan showed infraumbilical ventral hernia containing multiple small bowel loops.  Small bowel loops in the lower abdomen and pelvis are mildly dilated and fluid-filled, concerning for low-grade small bowel obstruction.  General surgery was consulted and she was admitted to the hospital.  Subjective / 24h Interval events: No acute issues or events overnight, no BM no flatus overnight, pain moderately well controlled currently 7 out of 10 but is due for pain medications.  Otherwise denies nausea vomiting only other complaint is of thirst and dry mouth.  Assessment & Plan:  Intractable nausea and vomiting secondary to infraumbilical ventral hernia, complicated by small bowel obstruction -continue conservative management for now, awaiting general surgery input.  NG tube placed, continue.  -Surgery following, tentative plan for surgery tomorrow on the 17th pending clinical status  Chronic systolic CHF -Cardiology following, repeat echo pending.   -Non compliant with medications for 2+ weeks -Not in acute exacerbation -patient appears hypovolemic will add low-dose IV fluids at 20 cc x 1 L to ensure euvolemia   OSA-continue CPAP  Essential hypertension-strict n.p.o., monitor blood pressure   DM2-continue sliding scale   Obesity, class III-BMI 43, she would benefit from weight loss   Diet Orders (From admission, onward)     Start     Ordered   08/02/21 1735  Diet NPO time specified Except for: Ice Chips  Diet effective now       Question:  Except for  Answer:  Ice Chips    08/02/21 1734            DVT prophylaxis: heparin injection 5,000 Units Start: 08/01/21 0600     Code Status: Full Code  Family Communication: None present  Status is: Inpatient  Remains inpatient appropriate because: Need for surgical intervention  Level of care: Med-Surg  Consultants:  General surgery  Cardiology   Procedures:  none  Microbiology  none  Antimicrobials: none    Objective: Vitals:   08/01/21 2008 08/02/21 0451 08/02/21 1944 08/03/21 0542  BP: (!) 158/93 (!) 159/93 (!) 171/92 (!) 144/85  Pulse: 72 76 98 86  Resp: 18 17 17 17   Temp: 98.2 F (36.8 C) 98.5 F (36.9 C) 99.2 F (37.3 C) 98.8 F (37.1 C)  TempSrc: Oral Oral    SpO2: 97% 93% 97% 92%  Weight:      Height:        Intake/Output Summary (Last 24 hours) at 08/03/2021 0719 Last data filed at 08/03/2021 08/05/2021 Gross per 24 hour  Intake --  Output 800 ml  Net -800 ml    Filed Weights   08/01/21 0054  Weight: 129.8 kg    Examination: Constitutional: NAD Eyes: no scleral icterus ENMT: Mucous membranes are moist.  Neck: normal, supple Respiratory: clear to auscultation bilaterally, no wheezing, no crackles. Normal respiratory effort.  Cardiovascular: Regular rate and rhythm, no murmurs / rubs / gallops. No LE edema Abdomen: non distended, no tenderness. Bowel sounds positive.  Musculoskeletal: no clubbing / cyanosis.  Skin: no rashes Neurologic: non focal    Data Reviewed: I have independently reviewed  following labs and imaging studies  CBC: Recent Labs  Lab 07/31/21 1911 08/03/21 0325  WBC 16.1* 18.9*  HGB 12.3 13.7  HCT 39.8 46.2*  MCV 73.4* 75.2*  PLT 304 [id number]    Basic Metabolic Panel: Recent Labs  Lab 07/31/21 1911 08/01/21 0319 08/03/21 0325  NA 140 139 139  K 3.5 3.4* 3.6  CL 101 101 101  CO2 30 27 26   GLUCOSE 107* 125* 117*  BUN 7 7 10   CREATININE 0.77 0.57 0.82  CALCIUM 9.3 8.9 9.0  MG  --   --  2.4    Liver Function Tests: Recent  Labs  Lab 07/31/21 1911  AST 9*  ALT 8  ALKPHOS 137*  BILITOT 0.6  PROT 7.5  ALBUMIN 3.9    Coagulation Profile: No results for input(s): INR, PROTIME in the last 168 hours. HbA1C: Recent Labs    08/01/21 0319  HGBA1C 6.5*    CBG: Recent Labs  Lab 08/02/21 1119 08/02/21 1653 08/02/21 1944 08/02/21 2329 08/03/21 0336  GLUCAP 95 99 94 108* 121*     Recent Results (from the past 240 hour(s))  Resp Panel by RT-PCR (Flu A&B, Covid) Nasopharyngeal Swab     Status: None   Collection Time: 07/31/21 10:50 PM   Specimen: Nasopharyngeal Swab; Nasopharyngeal(NP) swabs in vial transport medium  Result Value Ref Range Status   SARS Coronavirus 2 by RT PCR NEGATIVE NEGATIVE Final    Comment: (NOTE) SARS-CoV-2 target nucleic acids are NOT DETECTED.  The SARS-CoV-2 RNA is generally detectable in upper respiratory specimens during the acute phase of infection. The lowest concentration of SARS-CoV-2 viral copies this assay can detect is 138 copies/mL. A negative result does not preclude SARS-Cov-2 infection and should not be used as the sole basis for treatment or other patient management decisions. A negative result may occur with  improper specimen collection/handling, submission of specimen other than nasopharyngeal swab, presence of viral mutation(s) within the areas targeted by this assay, and inadequate number of viral copies(<138 copies/mL). A negative result must be combined with clinical observations, patient history, and epidemiological information. The expected result is Negative.  Fact Sheet for Patients:  EntrepreneurPulse.com.au  Fact Sheet for Healthcare Providers:  IncredibleEmployment.be  This test is no t yet approved or cleared by the [place] FDA and  has been authorized for detection and/or diagnosis of SARS-CoV-2 by FDA under an Emergency Use Authorization (EUA). This EUA will remain  in effect (meaning this  test can be used) for the duration of the COVID-19 declaration under Section 564(b)(1) of the Act, 21 U.S.C.section 360bbb-3(b)(1), unless the authorization is terminated  or revoked sooner.       Influenza A by PCR NEGATIVE NEGATIVE Final   Influenza B by PCR NEGATIVE NEGATIVE Final    Comment: (NOTE) The Xpert Xpress SARS-CoV-2/FLU/RSV plus assay is intended as an aid in the diagnosis of influenza from Nasopharyngeal swab specimens and should not be used as a sole basis for treatment. Nasal washings and aspirates are unacceptable for Xpert Xpress SARS-CoV-2/FLU/RSV testing.  Fact Sheet for Patients: EntrepreneurPulse.com.au  Fact Sheet for Healthcare Providers: IncredibleEmployment.be  This test is not yet approved or cleared by the [place] FDA and has been authorized for detection and/or diagnosis of SARS-CoV-2 by FDA under an Emergency Use Authorization (EUA). This EUA will remain in effect (meaning this test can be used) for the duration of the COVID-19 declaration under Section 564(b)(1) of the Act, 21 U.S.C. section 360bbb-3(b)(1), unless the authorization  is terminated or revoked.  Performed at [facility], [address], [place], [place] [address]       Radiology Studies: DG Abd Portable 1V  Result Date: 08/02/2021 CLINICAL DATA:  Small-bowel obstruction. EXAM: PORTABLE ABDOMEN - 1 VIEW COMPARISON:  Abdominal radiography done yesterday. FINDINGS: Orogastric or nasogastric tube is been removed. No dilated bowel is seen. I cannot appreciate the presence of any gastrointestinal contrast. IUD seen in the central pelvis. Ordinary lumbar degenerative changes are noted. IMPRESSION: One view abdominal image within normal limits. No evidence of bowel obstruction. Previously administered oral contrast is not visible. Electronically Signed   By: Nelson Chimes M.D.   On: 08/02/2021 10:33    Williamm Lynnzie Blackson  DO [facility]  Between 7 am - 7 pm I am available, please contact me via Amion (for emergencies) or Securechat (non urgent messages)  Between 7 pm - 7 am I am not available, please contact night coverage MD/APP via Amion

## 2021-08-04 ENCOUNTER — Encounter (HOSPITAL_COMMUNITY): Payer: Self-pay | Admitting: Internal Medicine

## 2021-08-04 ENCOUNTER — Inpatient Hospital Stay (HOSPITAL_COMMUNITY): Payer: BC Managed Care – PPO | Admitting: Certified Registered Nurse Anesthetist

## 2021-08-04 ENCOUNTER — Encounter (HOSPITAL_COMMUNITY)
Admission: EM | Disposition: A | Discharge status: Home / Self Care | Payer: Self-pay | Source: Home / Self Care | Attending: Internal Medicine

## 2021-08-04 DIAGNOSIS — I5042 Chronic combined systolic (congestive) and diastolic (congestive) heart failure: Secondary | ICD-10-CM | POA: Diagnosis not present

## 2021-08-04 HISTORY — PX: LAPAROTOMY: SHX154

## 2021-08-04 HISTORY — PX: VENTRAL HERNIA REPAIR: SHX424

## 2021-08-04 LAB — CBC
HCT: 42.3 % (ref 36.0–46.0)
Hemoglobin: 12.8 g/dL (ref 12.0–15.0)
MCH: 22.7 pg — ABNORMAL LOW (ref 26.0–34.0)
MCHC: 30.3 g/dL (ref 30.0–36.0)
MCV: 74.9 fL — ABNORMAL LOW (ref 80.0–100.0)
Platelets: 301 10*3/uL (ref 150–400)
RBC: 5.65 MIL/uL — ABNORMAL HIGH (ref 3.87–5.11)
RDW: 16.3 % — ABNORMAL HIGH (ref 11.5–15.5)
WBC: 17.8 10*3/uL — ABNORMAL HIGH (ref 4.0–10.5)
nRBC: 0 % (ref 0.0–0.2)

## 2021-08-04 LAB — BASIC METABOLIC PANEL
Anion gap: 9 (ref 5–15)
BUN: 10 mg/dL (ref 6–20)
CO2: 29 mmol/L (ref 22–32)
Calcium: 9 mg/dL (ref 8.9–10.3)
Chloride: 102 mmol/L (ref 98–111)
Creatinine, Ser: 0.94 mg/dL (ref 0.44–1.00)
GFR, Estimated: >60 mL/min (ref >60)
Glucose, Bld: 114 mg/dL — ABNORMAL HIGH (ref 70–99)
Potassium: 3.6 mmol/L (ref 3.5–5.1)
Sodium: 140 mmol/L (ref 135–145)

## 2021-08-04 LAB — GLUCOSE, CAPILLARY
Glucose-Capillary: 102 mg/dL — ABNORMAL HIGH (ref 70–99)
Glucose-Capillary: 105 mg/dL — ABNORMAL HIGH (ref 70–99)
Glucose-Capillary: 112 mg/dL — ABNORMAL HIGH (ref 70–99)
Glucose-Capillary: 126 mg/dL — ABNORMAL HIGH (ref 70–99)
Glucose-Capillary: 139 mg/dL — ABNORMAL HIGH (ref 70–99)

## 2021-08-04 SURGERY — REPAIR, HERNIA, VENTRAL, LAPAROSCOPIC
Anesthesia: General

## 2021-08-04 MED ORDER — ACETAMINOPHEN 650 MG RE SUPP
650.0000 mg | Freq: Four times a day (QID) | RECTAL | Status: DC | PRN
  Filled 2021-08-04: qty 1

## 2021-08-04 MED ORDER — SUGAMMADEX SODIUM 200 MG/2ML IV SOLN
INTRAVENOUS | Status: DC | PRN
  Administered 2021-08-04: 200 mg via INTRAVENOUS

## 2021-08-04 MED ORDER — ACETAMINOPHEN 325 MG PO TABS: 650.0000 mg | ORAL_TABLET | Freq: Four times a day (QID) | ORAL | Status: DC | PRN

## 2021-08-04 MED ORDER — ONDANSETRON HCL 4 MG/2ML IJ SOLN
INTRAMUSCULAR | Status: DC | PRN
  Administered 2021-08-04: 4 mg via INTRAVENOUS

## 2021-08-04 MED ORDER — KETAMINE HCL 10 MG/ML IJ SOLN
INTRAMUSCULAR | Status: AC
  Filled 2021-08-04: qty 1

## 2021-08-04 MED ORDER — PROPOFOL 10 MG/ML IV BOLUS
INTRAVENOUS | Status: AC
  Filled 2021-08-04: qty 20

## 2021-08-04 MED ORDER — MIDAZOLAM HCL 2 MG/2ML IJ SOLN
INTRAMUSCULAR | Status: AC
  Filled 2021-08-04: qty 2

## 2021-08-04 MED ORDER — FENTANYL CITRATE (PF) 100 MCG/2ML IJ SOLN
INTRAMUSCULAR | Status: AC
  Filled 2021-08-04: qty 2

## 2021-08-04 MED ORDER — PROMETHAZINE HCL 25 MG/ML IJ SOLN: 6.2500 mg | INTRAMUSCULAR | Status: DC | PRN

## 2021-08-04 MED ORDER — HYDROMORPHONE HCL 1 MG/ML IJ SOLN
1.0000 mg | INTRAMUSCULAR | Status: DC | PRN
  Administered 2021-08-04 – 2021-08-05 (×5): 1 mg via INTRAVENOUS
  Filled 2021-08-04 (×5): qty 1

## 2021-08-04 MED ORDER — ROCURONIUM BROMIDE 10 MG/ML (PF) SYRINGE
PREFILLED_SYRINGE | INTRAVENOUS | Status: AC
  Filled 2021-08-04: qty 10

## 2021-08-04 MED ORDER — BUPIVACAINE LIPOSOME 1.3 % IJ SUSP
INTRAMUSCULAR | Status: AC
  Filled 2021-08-04: qty 20

## 2021-08-04 MED ORDER — MORPHINE SULFATE (PF) 2 MG/ML IV SOLN: 1.0000 mg | INTRAVENOUS | Status: DC | PRN

## 2021-08-04 MED ORDER — KCL IN DEXTROSE-NACL 20-5-0.45 MEQ/L-%-% IV SOLN
INTRAVENOUS | Status: DC
  Filled 2021-08-04 (×7): qty 1000

## 2021-08-04 MED ORDER — ENOXAPARIN SODIUM 40 MG/0.4ML IJ SOSY
40.0000 mg | PREFILLED_SYRINGE | INTRAMUSCULAR | Status: DC
  Administered 2021-08-05 – 2021-08-07 (×3): 40 mg via SUBCUTANEOUS
  Filled 2021-08-04 (×3): qty 0.4

## 2021-08-04 MED ORDER — MIDAZOLAM HCL 2 MG/2ML IJ SOLN: 0.5000 mg | Freq: Once | INTRAMUSCULAR | Status: DC | PRN

## 2021-08-04 MED ORDER — KETAMINE HCL 10 MG/ML IJ SOLN
INTRAMUSCULAR | Status: DC | PRN
  Administered 2021-08-04: 30 mg via INTRAVENOUS
  Administered 2021-08-04: 20 mg via INTRAVENOUS

## 2021-08-04 MED ORDER — ONDANSETRON HCL 4 MG/2ML IJ SOLN
INTRAMUSCULAR | Status: AC
  Filled 2021-08-04: qty 2

## 2021-08-04 MED ORDER — BUPIVACAINE HCL 0.25 % IJ SOLN
INTRAMUSCULAR | Status: AC
  Filled 2021-08-04: qty 1

## 2021-08-04 MED ORDER — ONDANSETRON HCL 4 MG/2ML IJ SOLN: 4.0000 mg | Freq: Four times a day (QID) | INTRAMUSCULAR | Status: DC | PRN

## 2021-08-04 MED ORDER — HYDROMORPHONE HCL 1 MG/ML IJ SOLN
INTRAMUSCULAR | Status: AC
  Filled 2021-08-04: qty 1

## 2021-08-04 MED ORDER — DEXAMETHASONE SODIUM PHOSPHATE 10 MG/ML IJ SOLN
INTRAMUSCULAR | Status: DC | PRN
  Administered 2021-08-04: 10 mg via INTRAVENOUS

## 2021-08-04 MED ORDER — FENTANYL CITRATE (PF) 100 MCG/2ML IJ SOLN
INTRAMUSCULAR | Status: DC | PRN
  Administered 2021-08-04: 100 ug via INTRAVENOUS

## 2021-08-04 MED ORDER — BUPIVACAINE HCL (PF) 0.25 % IJ SOLN
INTRAMUSCULAR | Status: DC | PRN
  Administered 2021-08-04: 20 mL

## 2021-08-04 MED ORDER — DEXAMETHASONE SODIUM PHOSPHATE 10 MG/ML IJ SOLN
INTRAMUSCULAR | Status: AC
  Filled 2021-08-04: qty 1

## 2021-08-04 MED ORDER — LACTATED RINGERS IV SOLN: INTRAVENOUS | Status: DC

## 2021-08-04 MED ORDER — ONDANSETRON 4 MG PO TBDP: 4.0000 mg | ORAL_TABLET | Freq: Four times a day (QID) | ORAL | Status: DC | PRN

## 2021-08-04 MED ORDER — PROPOFOL 10 MG/ML IV BOLUS
INTRAVENOUS | Status: DC | PRN
  Administered 2021-08-04: 200 mg via INTRAVENOUS

## 2021-08-04 MED ORDER — MIDAZOLAM HCL 5 MG/5ML IJ SOLN
INTRAMUSCULAR | Status: DC | PRN
  Administered 2021-08-04: 2 mg via INTRAVENOUS

## 2021-08-04 MED ORDER — SODIUM CHLORIDE 0.9 % IV SOLN
1.0000 g | INTRAVENOUS | Status: AC
  Administered 2021-08-04: 11:00:00 1 g via INTRAVENOUS
  Filled 2021-08-04: qty 1

## 2021-08-04 MED ORDER — ROCURONIUM BROMIDE 10 MG/ML (PF) SYRINGE
PREFILLED_SYRINGE | INTRAVENOUS | Status: DC | PRN
  Administered 2021-08-04: 50 mg via INTRAVENOUS
  Administered 2021-08-04: 10 mg via INTRAVENOUS

## 2021-08-04 MED ORDER — BUPIVACAINE LIPOSOME 1.3 % IJ SUSP
INTRAMUSCULAR | Status: DC | PRN
  Administered 2021-08-04: 20 mL

## 2021-08-04 MED ORDER — BUPIVACAINE-EPINEPHRINE (PF) 0.25% -1:200000 IJ SOLN
INTRAMUSCULAR | Status: AC
  Filled 2021-08-04: qty 30

## 2021-08-04 MED ORDER — HYDROMORPHONE HCL 1 MG/ML IJ SOLN
0.2500 mg | INTRAMUSCULAR | Status: DC | PRN
  Administered 2021-08-04 (×4): 0.5 mg via INTRAVENOUS

## 2021-08-04 MED ORDER — SUCCINYLCHOLINE CHLORIDE 200 MG/10ML IV SOSY
PREFILLED_SYRINGE | INTRAVENOUS | Status: DC | PRN
  Administered 2021-08-04: 200 mg via INTRAVENOUS

## 2021-08-04 MED ORDER — MEPERIDINE HCL 50 MG/ML IJ SOLN: 6.2500 mg | INTRAMUSCULAR | Status: DC | PRN

## 2021-08-04 MED ORDER — OXYCODONE HCL 5 MG/5ML PO SOLN: 5.0000 mg | Freq: Once | ORAL | Status: DC | PRN

## 2021-08-04 MED ORDER — SCOPOLAMINE 1 MG/3DAYS TD PT72: 1.0000 | MEDICATED_PATCH | TRANSDERMAL | Status: DC

## 2021-08-04 MED ORDER — OXYCODONE HCL 5 MG PO TABS: 5.0000 mg | ORAL_TABLET | Freq: Once | ORAL | Status: DC | PRN

## 2021-08-04 MED ORDER — CHLORHEXIDINE GLUCONATE 0.12 % MT SOLN
15.0000 mL | Freq: Once | OROMUCOSAL | Status: AC
  Administered 2021-08-04: 10:00:00 15 mL via OROMUCOSAL

## 2021-08-04 MED ORDER — LIDOCAINE 2% (20 MG/ML) 5 ML SYRINGE
INTRAMUSCULAR | Status: DC | PRN
  Administered 2021-08-04: 50 mg via INTRAVENOUS

## 2021-08-04 SURGICAL SUPPLY — 82 items
ADH SKN CLS APL DERMABOND .7 (GAUZE/BANDAGES/DRESSINGS) ×1
APL PRP STRL LF DISP 70% ISPRP (MISCELLANEOUS) ×2
APL SWBSTK 6 STRL LF DISP (MISCELLANEOUS) ×1
APPLICATOR COTTON TIP 6 STRL (MISCELLANEOUS) ×1 IMPLANT
APPLICATOR COTTON TIP 6IN STRL (MISCELLANEOUS) ×2 IMPLANT
BAG COUNTER SPONGE SURGICOUNT (BAG) IMPLANT
BAG SPNG CNTER NS LX DISP (BAG)
BINDER ABDOMINAL 12 ML 46-62 (SOFTGOODS) ×2 IMPLANT
BLADE EXTENDED COATED 6.5IN (ELECTRODE) ×1 IMPLANT
BLADE HEX COATED 2.75 (ELECTRODE) ×2 IMPLANT
CHLORAPREP W/TINT 26 (MISCELLANEOUS) ×4 IMPLANT
COVER MAYO STAND STRL (DRAPES) IMPLANT
COVER SURGICAL LIGHT HANDLE (MISCELLANEOUS) ×2 IMPLANT
DECANTER SPIKE VIAL GLASS SM (MISCELLANEOUS) ×2 IMPLANT
DERMABOND ADVANCED (GAUZE/BANDAGES/DRESSINGS) ×1
DERMABOND ADVANCED .7 DNX12 (GAUZE/BANDAGES/DRESSINGS) IMPLANT
DEVICE SECURE STRAP 25 ABSORB (INSTRUMENTS) IMPLANT
DEVICE TROCAR PUNCTURE CLOSURE (ENDOMECHANICALS) ×2 IMPLANT
DRAPE INCISE IOBAN 66X45 STRL (DRAPES) IMPLANT
DRAPE LAPAROSCOPIC ABDOMINAL (DRAPES) ×2 IMPLANT
DRAPE UTILITY XL STRL (DRAPES) ×2 IMPLANT
DRAPE WARM FLUID 44X44 (DRAPES) IMPLANT
DRSG OPSITE POSTOP 4X8 (GAUZE/BANDAGES/DRESSINGS) ×1 IMPLANT
DRSG TEGADERM 4X4.75 (GAUZE/BANDAGES/DRESSINGS) ×1 IMPLANT
ELECT REM PT RETURN 15FT ADLT (MISCELLANEOUS) ×2 IMPLANT
EVACUATOR SILICONE 100CC (DRAIN) ×1 IMPLANT
GAUZE SPONGE 2X2 8PLY STRL LF (GAUZE/BANDAGES/DRESSINGS) IMPLANT
GAUZE SPONGE 4X4 12PLY STRL (GAUZE/BANDAGES/DRESSINGS) ×2 IMPLANT
GLOVE SURG ORTHO LTX SZ8 (GLOVE) ×2 IMPLANT
GLOVE SURG SYN 7.5  E (GLOVE) ×2
GLOVE SURG SYN 7.5 E (GLOVE) ×1 IMPLANT
GLOVE SURG SYN 7.5 PF PI (GLOVE) ×1 IMPLANT
GLOVE SURG UNDER POLY LF SZ7 (GLOVE) ×2 IMPLANT
GOWN STRL REUS W/TWL LRG LVL3 (GOWN DISPOSABLE) ×2 IMPLANT
GOWN STRL REUS W/TWL XL LVL3 (GOWN DISPOSABLE) ×4 IMPLANT
HANDLE SUCTION POOLE (INSTRUMENTS) IMPLANT
IRRIG SUCT STRYKERFLOW 2 WTIP (MISCELLANEOUS)
IRRIGATION SUCT STRKRFLW 2 WTP (MISCELLANEOUS) IMPLANT
KIT BASIN OR (CUSTOM PROCEDURE TRAY) ×2 IMPLANT
KIT TURNOVER KIT A (KITS) IMPLANT
MARKER SKIN DUAL TIP RULER LAB (MISCELLANEOUS) ×2 IMPLANT
NDL SPNL 22GX3.5 QUINCKE BK (NEEDLE) ×1 IMPLANT
NEEDLE HYPO 22GX1.5 SAFETY (NEEDLE) ×1 IMPLANT
NEEDLE SPNL 22GX3.5 QUINCKE BK (NEEDLE) ×2 IMPLANT
NS IRRIG 1000ML POUR BTL (IV SOLUTION) ×2 IMPLANT
PACK GENERAL/GYN (CUSTOM PROCEDURE TRAY) ×2 IMPLANT
PAD POSITIONING PINK XL (MISCELLANEOUS) IMPLANT
PENCIL SMOKE EVACUATOR (MISCELLANEOUS) IMPLANT
PROTECTOR NERVE ULNAR (MISCELLANEOUS) IMPLANT
SCISSORS LAP 5X35 DISP (ENDOMECHANICALS) IMPLANT
SET TUBE SMOKE EVAC HIGH FLOW (TUBING) ×2 IMPLANT
SHEARS HARMONIC ACE PLUS 36CM (ENDOMECHANICALS) IMPLANT
SLEEVE XCEL OPT CAN 5 100 (ENDOMECHANICALS) ×1 IMPLANT
SOL ANTI FOG 6CC (MISCELLANEOUS) ×1 IMPLANT
SOLUTION ANTI FOG 6CC (MISCELLANEOUS) ×1
SPONGE GAUZE 2X2 STER 10/PKG (GAUZE/BANDAGES/DRESSINGS) ×1
SPONGE T-LAP 18X18 ~~LOC~~+RFID (SPONGE) IMPLANT
STAPLER VISISTAT 35W (STAPLE) ×3 IMPLANT
STRIP CLOSURE SKIN 1/2X4 (GAUZE/BANDAGES/DRESSINGS) ×4 IMPLANT
SUCTION POOLE HANDLE (INSTRUMENTS) ×2
SUT ETHILON 3 0 FSL (SUTURE) ×1 IMPLANT
SUT NOV 1 T60/GS (SUTURE) IMPLANT
SUT NOVA 1 T20/GS 25DT (SUTURE) ×2 IMPLANT
SUT NOVA NAB DX-16 0-1 5-0 T12 (SUTURE) IMPLANT
SUT SILK 2 0 (SUTURE) ×2
SUT SILK 2 0 SH CR/8 (SUTURE) IMPLANT
SUT SILK 2-0 18XBRD TIE 12 (SUTURE) IMPLANT
SUT SILK 3 0 (SUTURE)
SUT SILK 3 0 SH CR/8 (SUTURE) ×1 IMPLANT
SUT SILK 3-0 18XBRD TIE 12 (SUTURE) IMPLANT
SUT VIC AB 2-0 SH 18 (SUTURE) ×1 IMPLANT
SUT VICRYL 2 0 18  UND BR (SUTURE)
SUT VICRYL 2 0 18 UND BR (SUTURE) IMPLANT
TACKER 5MM HERNIA 3.5CML NAB (ENDOMECHANICALS) IMPLANT
TAPE CLOTH 4X10 WHT NS (GAUZE/BANDAGES/DRESSINGS) IMPLANT
TOWEL OR 17X26 10 PK STRL BLUE (TOWEL DISPOSABLE) ×4 IMPLANT
TOWEL OR NON WOVEN STRL DISP B (DISPOSABLE) ×2 IMPLANT
TRAY FOLEY MTR SLVR 16FR STAT (SET/KITS/TRAYS/PACK) ×2 IMPLANT
TRAY LAPAROSCOPIC (CUSTOM PROCEDURE TRAY) ×2 IMPLANT
TROCAR BLADELESS OPT 5 100 (ENDOMECHANICALS) ×2 IMPLANT
WATER STERILE IRR 1000ML POUR (IV SOLUTION) ×2 IMPLANT
YANKAUER SUCT BULB TIP NO VENT (SUCTIONS) IMPLANT

## 2021-08-04 NOTE — Anesthesia Postprocedure Evaluation (Signed)
Anesthesia Post Note  Patient: [name]  Procedure(s) Performed: DIAGNOSTIC LAPAROSCOPY; OPEN PRIMARY REPAIR INCARCERATED INCISIONAL HERNIA EXPLORATORY LAPAROTOMY     Patient location during evaluation: PACU Anesthesia Type: General Level of consciousness: patient cooperative, oriented and sedated Pain management: pain level controlled Vital Signs Assessment: post-procedure vital signs reviewed and stable Respiratory status: spontaneous breathing, nonlabored ventilation, respiratory function stable and patient connected to nasal cannula oxygen Cardiovascular status: blood pressure returned to baseline and stable Postop Assessment: no apparent nausea or vomiting Anesthetic complications: no   No notable events documented.  Last Vitals:  Vitals:   08/04/21 1430 08/04/21 1455  BP: (!) 166/91 (!) 148/87  Pulse: 93 94  Resp: 11 16  Temp: 36.7 C 37.1 C  SpO2: 96% 95%    Last Pain:  Vitals:   08/04/21 1455  TempSrc: Oral  PainSc:                  Karole Oo,E. Kysa Calais

## 2021-08-04 NOTE — Anesthesia Preprocedure Evaluation (Addendum)
Anesthesia Evaluation  Patient identified by MRN, date of birth, ID band Patient awake    Reviewed: Allergy & Precautions, NPO status , Patient's Chart, lab work & pertinent test results, reviewed documented beta blocker date and time   History of Anesthesia Complications Negative for: history of anesthetic complications  Airway Mallampati: II  TM Distance: >3 FB Neck ROM: Full    Dental  (+) Dental Advisory Given   Pulmonary asthma , sleep apnea (does not use her CPAP) , COPD,  COPD inhaler,  07/31/2021 SARS coronavirus NEG   breath sounds clear to auscultation       Cardiovascular hypertension, Pt. on medications and Pt. on home beta blockers (-) angina+CHF (entresto)   Rhythm:Regular Rate:Normal  08/03/2021 ECHO: EF 35-40%, global hypokinesis   Neuro/Psych negative neurological ROS     GI/Hepatic Neg liver ROS, N/V with incarcerated hernia   Endo/Other  diabetes (glu 102), Oral Hypoglycemic AgentsMorbid obesity  Renal/GU negative Renal ROS     Musculoskeletal   Abdominal (+) + obese,   Peds  Hematology negative hematology ROS (+)   Anesthesia Other Findings   Reproductive/Obstetrics 07/31/2021 preg NEG                            Anesthesia Physical Anesthesia Plan  ASA: 4  Anesthesia Plan: General   Post-op Pain Management:    Induction: Intravenous  PONV Risk Score and Plan: 3 and Ondansetron, Dexamethasone and Scopolamine patch - Pre-op  Airway Management Planned: Oral ETT  Additional Equipment: None  Intra-op Plan:   Post-operative Plan: Possible Post-op intubation/ventilation  Informed Consent:     Dental advisory given  Plan Discussed with: CRNA and Surgeon  Anesthesia Plan Comments:        Anesthesia Quick Evaluation

## 2021-08-04 NOTE — Progress Notes (Signed)
PROGRESS NOTE  [name] SFS:[id number] DOB: Jan 28, 1981 DOA: 07/31/2021 PCP: Donato Schultz, DO   LOS: 3 days   Brief Narrative / Interim history: 40 year old female with history of chronic systolic CHF with most recent EF 30% in February 2022, followed by heart failure clinic, OSA on CPAP, HTN, DM2, obesity class III, history of C-section comes to the hospital with abdominal pain, nausea and vomiting.  CT scan showed infraumbilical ventral hernia containing multiple small bowel loops.  Small bowel loops in the lower abdomen and pelvis are mildly dilated and fluid-filled, concerning for low-grade small bowel obstruction.  General surgery was consulted and she was admitted to the hospital.  Subjective / 24h Interval events: Nausea vomiting resolved, no BM or flatus overnight, requesting NG tube removal.  Somewhat anxious for surgery today but otherwise denies headache fever chills chest pain shortness of breath.  Assessment & Plan:  Intractable nausea and vomiting secondary to infraumbilical ventral hernia, complicated by small bowel obstruction -General surgery following, appreciate insight recommendations plan for hernia repair/reduction today per their expertise -Postsurgical care including NG tube management and diet per surgery  Chronic systolic CHF, not in acute exacerbation -Cardiology following, repeat echo shows EF 35 to 40% with global hypokinesis of the left ventricle -Non compliant with medications for 2+ weeks -Continue low rate LR 20 cc/h x 1 L perioperatively to maintain euvolemia   OSA-continue CPAP  Essential hypertension-strict n.p.o., monitor blood pressure   DM2-continue sliding scale   Obesity, class III-BMI 43, she would benefit from weight loss   Diet Orders (From admission, onward)     Start     Ordered   08/04/21 0001  Diet NPO time specified  Diet effective midnight        08/03/21 0931            DVT prophylaxis:      Code  Status: Full Code  Family Communication: None present  Status is: Inpatient  Remains inpatient appropriate because: Need for surgical intervention  Level of care: Med-Surg  Consultants:  General surgery  Cardiology   Procedures:  Hernia repair and reduction 08/04/2021  Microbiology  none  Antimicrobials: none    Objective: Vitals:   08/02/21 1944 08/03/21 0542 08/03/21 2002 08/04/21 0454  BP: (!) 171/92 (!) 144/85 (!) 165/93 (!) 143/91  Pulse: 98 86 90 91  Resp: 17 17 17 16   Temp: 99.2 F (37.3 C) 98.8 F (37.1 C) 99.1 F (37.3 C) 98.8 F (37.1 C)  TempSrc:      SpO2: 97% 92% 95% 90%  Weight:      Height:        Intake/Output Summary (Last 24 hours) at 08/04/2021 0728 Last data filed at 08/04/2021 0600 Gross per 24 hour  Intake 372.12 ml  Output 1400 ml  Net -1027.88 ml    Filed Weights   08/01/21 0054  Weight: 129.8 kg    Examination: General:  Pleasantly resting in bed, No acute distress. HEENT:  Normocephalic atraumatic.  Sclerae nonicteric, noninjected.  NG tube intact dark green output extraocular movements intact bilaterally. Neck:  Without mass or deformity.  Trachea is midline. Lungs:  Clear to auscultate bilaterally without rhonchi, wheeze, or rales. Heart:  Regular rate and rhythm.  Without murmurs, rubs, or gallops. Abdomen:  Soft, nontender, nondistended.  Without guarding or rebound. Extremities: Without cyanosis, clubbing, edema, or obvious deformity. Vascular:  Dorsalis pedis and posterior tibial pulses palpable bilaterally. Skin:  Warm and dry, no erythema, no  ulcerations.   Data Reviewed: I have independently reviewed following labs and imaging studies  CBC: Recent Labs  Lab 07/31/21 1911 08/03/21 0325 08/04/21 0312  WBC 16.1* 18.9* 17.8*  HGB 12.3 13.7 12.8  HCT 39.8 46.2* 42.3  MCV 73.4* 75.2* 74.9*  PLT 304 325 [id number]    Basic Metabolic Panel: Recent Labs  Lab 07/31/21 1911 08/01/21 0319 08/03/21 0325  08/04/21 0312  NA 140 139 139 140  K 3.5 3.4* 3.6 3.6  CL 101 101 101 102  CO2 30 27 26 29   GLUCOSE 107* 125* 117* 114*  BUN 7 7 10 10   CREATININE 0.77 0.57 0.82 0.94  CALCIUM 9.3 8.9 9.0 9.0  MG  --   --  2.4  --     Liver Function Tests: Recent Labs  Lab 07/31/21 1911  AST 9*  ALT 8  ALKPHOS 137*  BILITOT 0.6  PROT 7.5  ALBUMIN 3.9    Coagulation Profile: No results for input(s): INR, PROTIME in the last 168 hours. HbA1C: No results for input(s): HGBA1C in the last 72 hours.  CBG: Recent Labs  Lab 08/03/21 1604 08/03/21 2003 08/04/21 0000 08/04/21 0350 08/04/21 0724  GLUCAP 138* 106* 112* 126* 105*     Recent Results (from the past 240 hour(s))  Resp Panel by RT-PCR (Flu A&B, Covid) Nasopharyngeal Swab     Status: None   Collection Time: 07/31/21 10:50 PM   Specimen: Nasopharyngeal Swab; Nasopharyngeal(NP) swabs in vial transport medium  Result Value Ref Range Status   SARS Coronavirus 2 by RT PCR NEGATIVE NEGATIVE Final    Comment: (NOTE) SARS-CoV-2 target nucleic acids are NOT DETECTED.  The SARS-CoV-2 RNA is generally detectable in upper respiratory specimens during the acute phase of infection. The lowest concentration of SARS-CoV-2 viral copies this assay can detect is 138 copies/mL. A negative result does not preclude SARS-Cov-2 infection and should not be used as the sole basis for treatment or other patient management decisions. A negative result may occur with  improper specimen collection/handling, submission of specimen other than nasopharyngeal swab, presence of viral mutation(s) within the areas targeted by this assay, and inadequate number of viral copies(<138 copies/mL). A negative result must be combined with clinical observations, patient history, and epidemiological information. The expected result is Negative.  Fact Sheet for Patients:  EntrepreneurPulse.com.au  Fact Sheet for Healthcare Providers:   IncredibleEmployment.be  This test is no t yet approved or cleared by the [place] FDA and  has been authorized for detection and/or diagnosis of SARS-CoV-2 by FDA under an Emergency Use Authorization (EUA). This EUA will remain  in effect (meaning this test can be used) for the duration of the COVID-19 declaration under Section 564(b)(1) of the Act, 21 U.S.C.section 360bbb-3(b)(1), unless the authorization is terminated  or revoked sooner.       Influenza A by PCR NEGATIVE NEGATIVE Final   Influenza B by PCR NEGATIVE NEGATIVE Final    Comment: (NOTE) The Xpert Xpress SARS-CoV-2/FLU/RSV plus assay is intended as an aid in the diagnosis of influenza from Nasopharyngeal swab specimens and should not be used as a sole basis for treatment. Nasal washings and aspirates are unacceptable for Xpert Xpress SARS-CoV-2/FLU/RSV testing.  Fact Sheet for Patients: EntrepreneurPulse.com.au  Fact Sheet for Healthcare Providers: IncredibleEmployment.be  This test is not yet approved or cleared by the [place] FDA and has been authorized for detection and/or diagnosis of SARS-CoV-2 by FDA under an Emergency Use Authorization (EUA). This EUA will remain in  effect (meaning this test can be used) for the duration of the COVID-19 declaration under Section 564(b)(1) of the Act, 21 U.S.C. section 360bbb-3(b)(1), unless the authorization is terminated or revoked.  Performed at [facility], [address] [place], [place] [address]   Surgical pcr screen     Status: None   Collection Time: 08/03/21  9:37 AM   Specimen: Nasal Mucosa; Nasal Swab  Result Value Ref Range Status   MRSA, PCR NEGATIVE NEGATIVE Final   Staphylococcus aureus NEGATIVE NEGATIVE Final    Comment: (NOTE) The Xpert SA Assay (FDA approved for NASAL specimens in patients 2 years of age and older), is one component of a  comprehensive surveillance program. It is not intended to diagnose infection nor to guide or monitor treatment. Performed at [facility], [address], [place], [place] [address]       Radiology Studies: ECHOCARDIOGRAM LIMITED  Result Date: 08/03/2021    ECHOCARDIOGRAM LIMITED REPORT   Patient Name:   [name] Date of Exam: 08/03/2021 Medical Rec #:  [id number]         Height:       68.0 in Accession #:    [id number]        Weight:       286.2 lb Date of Birth:  12-23-1980         BSA:          2.380 m Patient Age:    40 years          BP:           144/85 mmHg Patient Gender: F                 HR:           89 bpm. Exam Location:  Inpatient Procedure: Limited Echo and Intracardiac Opacification Agent Indications:    CHF  History:        Patient has prior history of Echocardiogram examinations, most                 recent 11/12/2020. Risk Factors:Diabetes.  Sonographer:    Alvera Novel Referring Phys: [id number] Kathlynn Grate [place]  Sonographer Comments: Patient is morbidly obese. Image acquisition challenging due to patient body habitus. IMPRESSIONS  1. Left ventricular ejection fraction, by estimation, is 35 to 40%. The left ventricle has moderately decreased function. The left ventricle demonstrates global hypokinesis.  2. Right ventricular systolic function is normal. The right ventricular size is normal. Tricuspid regurgitation signal is inadequate for assessing PA pressure.  3. The inferior vena cava is normal in size with greater than 50% respiratory variability, suggesting right atrial pressure of 3 mmHg.  4. Limited echo with no doppler. FINDINGS  Left Ventricle: Left ventricular ejection fraction, by estimation, is 35 to 40%. The left ventricle has moderately decreased function. The left ventricle demonstrates global hypokinesis. Definity contrast agent was given IV to delineate the left ventricular endocardial borders. The left ventricular internal cavity size was  normal in size. There is no left ventricular hypertrophy. Right Ventricle: The right ventricular size is normal. No increase in right ventricular wall thickness. Right ventricular systolic function is normal. Tricuspid regurgitation signal is inadequate for assessing PA pressure. Right Atrium: Right atrial size was normal in size. Pericardium: There is no evidence of pericardial effusion. Venous: The inferior vena cava is normal in size with greater than 50% respiratory variability, suggesting right atrial pressure of 3 mmHg. LEFT VENTRICLE PLAX 2D  LVIDd:         5.00 cm LVIDs:         4.30 cm LV PW:         1.00 cm LV IVS:        1.00 cm  LV Volumes (MOD) LV vol d, MOD A2C: 167.0 ml LV vol d, MOD A4C: 160.0 ml LV vol s, MOD A2C: 90.9 ml LV vol s, MOD A4C: 104.0 ml LV SV MOD A2C:     76.1 ml LV SV MOD A4C:     160.0 ml LV SV MOD BP:      70.1 ml Dalton McleanMD Electronically signed by Franki Monte Signature Date/Time: 08/03/2021/4:39:01 PM    Final     Senaida Ores DO [facility]  Between 7 am - 7 pm I am available, please contact me via Amion (for emergencies) or Securechat (non urgent messages)  Between 7 pm - 7 am I am not available, please contact night coverage MD/APP via Amion

## 2021-08-04 NOTE — Op Note (Signed)
Operative Note  Pre-operative Diagnosis:  incarcerated incisional hernia with small bowel obstruction  Post-operative Diagnosis:  same  Surgeon:  [name], MD  Assistant:  Angelena Form, MD   Procedure:  diagnostic laparoscopy, open primary repair of incarcerated incisional hernia with lysis of adhesions (20 minutes)  Anesthesia:  general  Estimated Blood Loss:  75 cc  Drains: 19Fr Blake drain in subcutaneous tissues         Specimen: none  Indications: Patient is a 40 year old female with a history of cesarean section admitted to the surgical service with small bowel obstruction.  CT scan demonstrated an incisional hernia containing multiple loops of small intestine.  Patient was treated initially with nasogastric decompression without symptomatic improvement over several days.  Therefore the patient is prepared and brought to the operating room for operative repair.  Procedure:  The patient was seen in the pre-op holding area. The risks, benefits, complications, treatment options, and expected outcomes were previously discussed with the patient. The patient agreed with the proposed plan and has signed the informed consent form.  The patient was brought to the operating room by the surgical team, identified as [name] and the procedure verified. A "time out" was completed and the above information confirmed.  Following induction of general endotracheal anesthesia, the patient was positioned and then prepped and draped in the usual aseptic fashion.  After ascertaining that an adequate level of anesthesia been achieved, an incision is made at the left costal margin.  Using a 5 mm Optiview trocar, the peritoneal cavity was accessed under direct vision and pneumoperitoneum established.  Laparoscope was introduced and the abdomen was explored.  There was an incisional hernia in the midline below the level of the umbilicus.  This contained small bowel and omentum.  With manipulation it  was partially reducible but it did appear that the point of obstruction was in the loop of small bowel within the hernia sac.  Decision was made to convert to open surgery.  Left upper quadrant scope was removed.  Port was left in position.  Incision was made in the lower midline of the abdominal wall from just below the umbilicus towards the pubis.  Dissection was carried down through subcutaneous tissues.  Fascia was identified just below the umbilicus.  With careful dissection the hernia sac was identified and opened.  Hernia sac appeared to be chronic and was quite thick-walled.  It contained serosanguineous fluid.  There were multiple loops of small intestine.  Fascial defect was relatively small measuring approximately 4 cm in diameter.  Small bowel was partially reduced.  However there were 2 loops of small intestine which were densely adherent to the inferior portion of the hernia sac.  This required sharp lysis of adhesions between the loops of small bowel and the hernia sac.  2 loops of small bowel were completely freed from the hernia sac and inspected.  There was a 1.5 cm serosal defect on one of the loops of bowel.  This was repaired with interrupted 3-0 silk sutures.  The small intestine was then reduced through the defect back within the peritoneal cavity.  Adherent omentum was freed from the edges of the hernia defect.  Omentum is clamped and tied with 2-0 silk ligatures.  It is reduced back within the peritoneal cavity.  The fascial plane is developed circumferentially around the hernia defect.  The edges of the defect are cauterized with the electrocautery.  The defect is then closed with interrupted #1 Novafil simple  sutures.  Hernia sac is extensive.  The peritonealized surface is cauterized with the electrocautery.  A 19 [place] Blake drain is brought in through the right lower quadrant stab wound and placed into the residual hernia sac.  It is secured to the skin with a 2-0 nylon  suture.  Subcutaneous tissues were then closed in layers with interrupted 2-0 Vicryl sutures.  Prior to closure, a mixture of Exparel and Marcaine is injected into the fascial plane circumferentially.  Local anesthetic is also injected into the skin edges circumferentially.  Skin is then closed with stainless steel staples.  Drain is placed to bulb suction.  A honeycomb dressing is placed over the incision.  Left upper quadrant wound is closed with stainless steel staples and a dressing applied.  Patient is awakened from anesthesia and transported to the recovery room.  The patient tolerated the procedure well.   [name], MD [facility] Office: [phone]

## 2021-08-04 NOTE — Progress Notes (Signed)
Spoke to [facility], received report for surgery today.

## 2021-08-04 NOTE — Interval H&P Note (Signed)
History and Physical Interval Note:  08/04/2021 10:37 AM  [name]  has presented today for surgery, with the diagnosis of incarcerated ventral incisional hernia.  The various methods of treatment have been discussed with the patient and family. After consideration of risks, benefits and other options for treatment, the patient has consented to    Procedure(s): LAPAROSCOPIC repair incarcerated  VENTRAL incisional  HERNIA with possible mesh (N/A) EXPLORATORY LAPAROTOMY possible (N/A) as a surgical intervention.    The patient's history has been reviewed, patient examined, no change in status, stable for surgery.  I have reviewed the patient's chart and labs.  Questions were answered to the patient's satisfaction.    [name], MD [facility] practice Office: [phone]   [name]

## 2021-08-04 NOTE — Transfer of Care (Signed)
Immediate Anesthesia Transfer of Care Note  Patient: [name]  Procedure(s) Performed: Procedure(s): DIAGNOSTIC LAPAROSCOPY; OPEN PRIMARY REPAIR INCARCERATED INCISIONAL HERNIA (N/A) EXPLORATORY LAPAROTOMY (N/A)  Patient Location: PACU  Anesthesia Type:General  Level of Consciousness: Alert, Awake, Oriented  Airway & Oxygen Therapy: Patient Spontanous Breathing  Post-op Assessment: Report given to RN  Post vital signs: Reviewed and stable  Last Vitals:  Vitals:   08/04/21 0454 08/04/21 0920  BP: (!) 143/91 (!) 156/94  Pulse: 91 93  Resp: 16 17  Temp: 37.1 C   SpO2: 90% 96%    Complications: No apparent anesthesia complications

## 2021-08-04 NOTE — Progress Notes (Signed)
TTE with LVEF 35-40%, normal RV, RAP 3.  Patient in surgery today. Will follow-up and see her tomorrow.  Laurance Flatten, MD

## 2021-08-04 NOTE — Anesthesia Procedure Notes (Signed)
Procedure Name: Intubation Date/Time: 08/04/2021 11:22 AM Performed by: Gerald Leitz, CRNA Pre-anesthesia Checklist: Patient identified, Patient being monitored, Timeout performed, Emergency Drugs available and Suction available Patient Re-evaluated:Patient Re-evaluated prior to induction Oxygen Delivery Method: Circle system utilized Preoxygenation: Pre-oxygenation with 100% oxygen Induction Type: IV induction and Rapid sequence Ventilation: Mask ventilation without difficulty Laryngoscope Size: Mac and 3 Grade View: Grade I Tube type: Oral Tube size: 7.5 mm Number of attempts: 1 Placement Confirmation: ETT inserted through vocal cords under direct vision, positive ETCO2 and breath sounds checked- equal and bilateral Secured at: 23 cm Tube secured with: Tape Dental Injury: Teeth and Oropharynx as per pre-operative assessment

## 2021-08-04 NOTE — Plan of Care (Signed)
?  Problem: Elimination: ?Goal: Will not experience complications related to bowel motility ?Outcome: Not Progressing ?  ?Problem: Pain Managment: ?Goal: General experience of comfort will improve ?Outcome: Not Progressing ?  ?Problem: Safety: ?Goal: Ability to remain free from injury will improve ?Outcome: Not Progressing ?  ?

## 2021-08-05 ENCOUNTER — Encounter (HOSPITAL_COMMUNITY): Payer: Self-pay | Admitting: Surgery

## 2021-08-05 DIAGNOSIS — I5042 Chronic combined systolic (congestive) and diastolic (congestive) heart failure: Secondary | ICD-10-CM | POA: Diagnosis not present

## 2021-08-05 LAB — BASIC METABOLIC PANEL
Anion gap: 5 (ref 5–15)
BUN: 9 mg/dL (ref 6–20)
CO2: 31 mmol/L (ref 22–32)
Calcium: 8.4 mg/dL — ABNORMAL LOW (ref 8.9–10.3)
Chloride: 104 mmol/L (ref 98–111)
Creatinine, Ser: 0.91 mg/dL (ref 0.44–1.00)
GFR, Estimated: >60 mL/min (ref >60)
Glucose, Bld: 140 mg/dL — ABNORMAL HIGH (ref 70–99)
Potassium: 3.5 mmol/L (ref 3.5–5.1)
Sodium: 140 mmol/L (ref 135–145)

## 2021-08-05 LAB — CBC
HCT: 38.8 % (ref 36.0–46.0)
Hemoglobin: 11.9 g/dL — ABNORMAL LOW (ref 12.0–15.0)
MCH: 22.8 pg — ABNORMAL LOW (ref 26.0–34.0)
MCHC: 30.7 g/dL (ref 30.0–36.0)
MCV: 74.5 fL — ABNORMAL LOW (ref 80.0–100.0)
Platelets: 289 10*3/uL (ref 150–400)
RBC: 5.21 MIL/uL — ABNORMAL HIGH (ref 3.87–5.11)
RDW: 15.9 % — ABNORMAL HIGH (ref 11.5–15.5)
WBC: 13.9 10*3/uL — ABNORMAL HIGH (ref 4.0–10.5)
nRBC: 0 % (ref 0.0–0.2)

## 2021-08-05 LAB — MAGNESIUM: Magnesium: 2.1 mg/dL (ref 1.7–2.4)

## 2021-08-05 MED ORDER — ACETAMINOPHEN 325 MG PO TABS
650.0000 mg | ORAL_TABLET | Freq: Four times a day (QID) | ORAL | Status: DC | PRN
  Administered 2021-08-06: 650 mg via ORAL
  Filled 2021-08-05: qty 2

## 2021-08-05 MED ORDER — CHLORHEXIDINE GLUCONATE CLOTH 2 % EX PADS
6.0000 | MEDICATED_PAD | Freq: Every day | CUTANEOUS | Status: DC
  Administered 2021-08-05 – 2021-08-07 (×2): 6 via TOPICAL

## 2021-08-05 MED ORDER — ACETAMINOPHEN 650 MG RE SUPP: 650.0000 mg | Freq: Four times a day (QID) | RECTAL | Status: DC | PRN

## 2021-08-05 MED ORDER — ACETAMINOPHEN 10 MG/ML IV SOLN
1000.0000 mg | Freq: Four times a day (QID) | INTRAVENOUS | Status: AC
  Administered 2021-08-05 – 2021-08-06 (×4): 1000 mg via INTRAVENOUS
  Filled 2021-08-05 (×4): qty 100

## 2021-08-05 MED ORDER — METHOCARBAMOL 1000 MG/10ML IJ SOLN
500.0000 mg | Freq: Four times a day (QID) | INTRAVENOUS | Status: DC
  Administered 2021-08-05 – 2021-08-06 (×6): 500 mg via INTRAVENOUS
  Filled 2021-08-05: qty 500
  Filled 2021-08-05: qty 5
  Filled 2021-08-05: qty 500
  Filled 2021-08-05 (×3): qty 5
  Filled 2021-08-05 (×4): qty 500

## 2021-08-05 NOTE — Progress Notes (Signed)
PROGRESS NOTE  [name] [id number] DOB: 1981/02/02 DOA: 07/31/2021 PCP: Ann Held, DO   LOS: 4 days   Brief Narrative / Interim history: 40 year old female with history of chronic systolic CHF with most recent EF 30% in February 2022, followed by heart failure clinic, OSA on CPAP, HTN, DM2, obesity class III, history of C-section comes to the hospital with abdominal pain, nausea and vomiting.  CT scan showed infraumbilical ventral hernia containing multiple small bowel loops.  Small bowel loops in the lower abdomen and pelvis are mildly dilated and fluid-filled, concerning for low-grade small bowel obstruction.  General surgery was consulted and she was admitted to the hospital.  Subjective / 24h Interval events: No acute issues or events overnight, postop day 1 from abdominal surgery as below, tolerated well, indicates ongoing soreness from surgical site.  Otherwise requesting NG tube removal and advancement of diet but denies bowel movement or flatus.  Assessment & Plan:  Intractable nausea and vomiting and abdominal pain secondary to infraumbilical ventral hernia, complicated by small bowel obstruction, POA, resolving - General surgery following - status post diagnostic laparoscopy, open primary repair of incarcerated incisional hernia with lysis of adhesions 08/04/21 - JP drain/postsurgical care including NG tube management and diet per surgery  Chronic systolic CHF, not in acute exacerbation -Cardiology following, repeat echo shows EF 35 to 40% with global hypokinesis of the left ventricle -Non compliant with medications for 2+ weeks -Continue low rate LR 20 cc/h x 1 L perioperatively to maintain euvolemia   OSA-continue CPAP  Essential hypertension-strict n.p.o., monitor blood pressure   DM2-continue sliding scale   Obesity, class III-BMI 43, she would benefit from weight loss   Diet Orders (From admission, onward)     Start     Ordered   08/04/21  1458  Diet NPO time specified Except for: Ice Chips, Sips with Meds  Diet effective now       Question Answer Comment  Except for Ice Chips   Except for Sips with Meds      08/04/21 1458            DVT prophylaxis: enoxaparin (LOVENOX) injection 40 mg Start: 08/05/21 0800 SCD's Start: 08/04/21 1458     Code Status: Full Code  Family Communication: None present  Status is: Inpatient  Remains inpatient appropriate because: Need for surgical intervention  Level of care: Med-Surg  Consultants:  General surgery  Cardiology   Procedures:  Hernia repair and reduction 08/04/2021  Microbiology  none  Antimicrobials: none    Objective: Vitals:   08/04/21 1802 08/04/21 2200 08/05/21 0057 08/05/21 0533  BP: (!) 153/83 131/83 138/88 130/74  Pulse: 100 94 94 90  Resp: 17 15 15 16   Temp: 98.6 F (37 C) (!) 97.5 F (36.4 C) 98.2 F (36.8 C) 97.9 F (36.6 C)  TempSrc: Oral Oral Oral Oral  SpO2: 96% 99% 98% 98%  Weight:      Height:        Intake/Output Summary (Last 24 hours) at 08/05/2021 0726 Last data filed at 08/05/2021 0533 Gross per 24 hour  Intake 1823 ml  Output 2273 ml  Net -450 ml    Filed Weights   08/01/21 0054 08/04/21 0935  Weight: 129.8 kg 129.8 kg    Examination: General:  Pleasantly resting in bed, No acute distress. HEENT:  Normocephalic atraumatic.  Sclerae nonicteric, noninjected.  NG tube intact dark green output extraocular movements intact bilaterally. Neck:  Without mass or deformity.  Trachea is midline. Lungs:  Clear to auscultate bilaterally without rhonchi, wheeze, or rales. Heart:  Regular rate and rhythm.  Without murmurs, rubs, or gallops. Abdomen: Diffusely tender, postsurgical dressing clean dry intact, JP drain intact. Extremities: Without cyanosis, clubbing, edema, or obvious deformity. Vascular:  Dorsalis pedis and posterior tibial pulses palpable bilaterally. Skin:  Warm and dry, no erythema, no ulcerations.   Data  Reviewed: I have independently reviewed following labs and imaging studies  CBC: Recent Labs  Lab 07/31/21 1911 08/03/21 0325 08/04/21 0312  WBC 16.1* 18.9* 17.8*  HGB 12.3 13.7 12.8  HCT 39.8 46.2* 42.3  MCV 73.4* 75.2* 74.9*  PLT 304 325 [id number]    Basic Metabolic Panel: Recent Labs  Lab 07/31/21 1911 08/01/21 0319 08/03/21 0325 08/04/21 0312  NA 140 139 139 140  K 3.5 3.4* 3.6 3.6  CL 101 101 101 102  CO2 30 27 26 29   GLUCOSE 107* 125* 117* 114*  BUN 7 7 10 10   CREATININE 0.77 0.57 0.82 0.94  CALCIUM 9.3 8.9 9.0 9.0  MG  --   --  2.4  --     Liver Function Tests: Recent Labs  Lab 07/31/21 1911  AST 9*  ALT 8  ALKPHOS 137*  BILITOT 0.6  PROT 7.5  ALBUMIN 3.9    Coagulation Profile: No results for input(s): INR, PROTIME in the last 168 hours. HbA1C: No results for input(s): HGBA1C in the last 72 hours.  CBG: Recent Labs  Lab 08/04/21 0000 08/04/21 0350 08/04/21 0724 08/04/21 0923 08/04/21 1623  GLUCAP 112* 126* 105* 102* 139*     Recent Results (from the past 240 hour(s))  Resp Panel by RT-PCR (Flu A&B, Covid) Nasopharyngeal Swab     Status: None   Collection Time: 07/31/21 10:50 PM   Specimen: Nasopharyngeal Swab; Nasopharyngeal(NP) swabs in vial transport medium  Result Value Ref Range Status   SARS Coronavirus 2 by RT PCR NEGATIVE NEGATIVE Final    Comment: (NOTE) SARS-CoV-2 target nucleic acids are NOT DETECTED.  The SARS-CoV-2 RNA is generally detectable in upper respiratory specimens during the acute phase of infection. The lowest concentration of SARS-CoV-2 viral copies this assay can detect is 138 copies/mL. A negative result does not preclude SARS-Cov-2 infection and should not be used as the sole basis for treatment or other patient management decisions. A negative result may occur with  improper specimen collection/handling, submission of specimen other than nasopharyngeal swab, presence of viral mutation(s) within the areas  targeted by this assay, and inadequate number of viral copies(<138 copies/mL). A negative result must be combined with clinical observations, patient history, and epidemiological information. The expected result is Negative.  Fact Sheet for Patients:  EntrepreneurPulse.com.au  Fact Sheet for Healthcare Providers:  IncredibleEmployment.be  This test is no t yet approved or cleared by the [place] FDA and  has been authorized for detection and/or diagnosis of SARS-CoV-2 by FDA under an Emergency Use Authorization (EUA). This EUA will remain  in effect (meaning this test can be used) for the duration of the COVID-19 declaration under Section 564(b)(1) of the Act, 21 U.S.C.section 360bbb-3(b)(1), unless the authorization is terminated  or revoked sooner.       Influenza A by PCR NEGATIVE NEGATIVE Final   Influenza B by PCR NEGATIVE NEGATIVE Final    Comment: (NOTE) The Xpert Xpress SARS-CoV-2/FLU/RSV plus assay is intended as an aid in the diagnosis of influenza from Nasopharyngeal swab specimens and should not be used as a sole basis  for treatment. Nasal washings and aspirates are unacceptable for Xpert Xpress SARS-CoV-2/FLU/RSV testing.  Fact Sheet for Patients: BloggerCourse.com  Fact Sheet for Healthcare Providers: SeriousBroker.it  This test is not yet approved or cleared by the [place] FDA and has been authorized for detection and/or diagnosis of SARS-CoV-2 by FDA under an Emergency Use Authorization (EUA). This EUA will remain in effect (meaning this test can be used) for the duration of the COVID-19 declaration under Section 564(b)(1) of the Act, 21 U.S.C. section 360bbb-3(b)(1), unless the authorization is terminated or revoked.  Performed at [facility], [address], [place], [place] [address]   Surgical pcr screen     Status: None   Collection  Time: 08/03/21  9:37 AM   Specimen: Nasal Mucosa; Nasal Swab  Result Value Ref Range Status   MRSA, PCR NEGATIVE NEGATIVE Final   Staphylococcus aureus NEGATIVE NEGATIVE Final    Comment: (NOTE) The Xpert SA Assay (FDA approved for NASAL specimens in patients 59 years of age and older), is one component of a comprehensive surveillance program. It is not intended to diagnose infection nor to guide or monitor treatment. Performed at [facility], [address] [place], [place] [address]       Radiology Studies: No results found.  Williamm Jonnette Nuon DO [facility]  Between 7 am - 7 pm I am available, please contact me via Amion (for emergencies) or Securechat (non urgent messages)  Between 7 pm - 7 am I am not available, please contact night coverage MD/APP via Amion

## 2021-08-05 NOTE — Progress Notes (Signed)
Progress Note  Patient Name: [name] Date of Encounter: 08/05/2021  [facility] Cardiologist: Skeet Latch, MD   Subjective   Doing okay. Feels thirsty and hopes NGT can be removed soon.  S/p diagnostic laparoscopy, open primary repair of incarcerated incisional hernia with lysis of adhesions with surgery yesterday.   Inpatient Medications    Scheduled Meds:  Chlorhexidine Gluconate Cloth  6 each Topical Daily   enoxaparin (LOVENOX) injection  40 mg Subcutaneous Q24H   Continuous Infusions:  acetaminophen 1,000 mg (08/05/21 1201)   dextrose 5 % and 0.45 % NaCl with KCl 20 mEq/L 100 mL/hr at 08/05/21 0432   methocarbamol (ROBAXIN) IV 500 mg (08/05/21 1021)   PRN Meds: [START ON 08/06/2021] acetaminophen **OR** [START ON 08/06/2021] acetaminophen, HYDROmorphone (DILAUDID) injection, ondansetron **OR** ondansetron (ZOFRAN) IV   Vital Signs    Vitals:   08/04/21 2200 08/05/21 0057 08/05/21 0533 08/05/21 1240  BP: 131/83 138/88 130/74 (!) 152/91  Pulse: 94 94 90 87  Resp: 15 15 16 18   Temp: (!) 97.5 F (36.4 C) 98.2 F (36.8 C) 97.9 F (36.6 C)   TempSrc: Oral Oral Oral   SpO2: 99% 98% 98% 94%  Weight:      Height:        Intake/Output Summary (Last 24 hours) at 08/05/2021 1305 Last data filed at 08/05/2021 0533 Gross per 24 hour  Intake 1723 ml  Output 2248 ml  Net -525 ml    Last 3 Weights 08/04/2021 08/01/2021 03/29/2021  Weight (lbs) 286 lb 2.5 oz 286 lb 2.5 oz 275 lb 6.4 oz  Weight (kg) 129.8 kg 129.8 kg 124.921 kg      Telemetry    Not on telemetry.  ECG    No new tracing. - Personally Reviewed  Physical Exam   GEN: No acute distress.   Neck: No JVD. NGT in place Cardiac: RRR, no murmurs, rubs, or gallops.  Respiratory: Clear to auscultation bilaterally. GI: Obese, soft, mildly tender MS: No edema; No deformity. Neuro:  Nonfocal  Psych: Normal affect    Labs    High Sensitivity Troponin:  No results for input(s):  TROPONINIHS in the last 720 hours.   Chemistry Recent Labs  Lab 07/31/21 1911 08/01/21 0319 08/03/21 0325 08/04/21 0312 08/05/21 0754  NA 140   < > 139 140 140  K 3.5   < > 3.6 3.6 3.5  CL 101   < > 101 102 104  CO2 30   < > 26 29 31   GLUCOSE 107*   < > 117* 114* 140*  BUN 7   < > 10 10 9   CREATININE 0.77   < > 0.82 0.94 0.91  CALCIUM 9.3   < > 9.0 9.0 8.4*  MG  --   --  2.4  --  2.1  PROT 7.5  --   --   --   --   ALBUMIN 3.9  --   --   --   --   AST 9*  --   --   --   --   ALT 8  --   --   --   --   ALKPHOS 137*  --   --   --   --   BILITOT 0.6  --   --   --   --   GFRNONAA >60   < > >60 >60 >60  ANIONGAP 9   < > 12 9 5    < > =  values in this interval not displayed.     Lipids No results for input(s): CHOL, TRIG, HDL, LABVLDL, LDLCALC, CHOLHDL in the last 168 hours.  Hematology Recent Labs  Lab 08/03/21 0325 08/04/21 0312 08/05/21 0754  WBC 18.9* 17.8* 13.9*  RBC 6.14* 5.65* 5.21*  HGB 13.7 12.8 11.9*  HCT 46.2* 42.3 38.8  MCV 75.2* 74.9* 74.5*  MCH 22.3* 22.7* 22.8*  MCHC 29.7* 30.3 30.7  RDW 17.2* 16.3* 15.9*  PLT 325 301 289    Thyroid No results for input(s): TSH, FREET4 in the last 168 hours.  BNPNo results for input(s): BNP, PROBNP in the last 168 hours.  DDimer No results for input(s): DDIMER in the last 168 hours.   Radiology    ECHOCARDIOGRAM LIMITED  Result Date: 08/03/2021    ECHOCARDIOGRAM LIMITED REPORT   Patient Name:   [name] Date of Exam: 08/03/2021 Medical Rec #:  [id number]         Height:       68.0 in Accession #:    [id number]        Weight:       286.2 lb Date of Birth:  1981/01/20         BSA:          2.380 m Patient Age:    40 years          BP:           144/85 mmHg Patient Gender: F                 HR:           89 bpm. Exam Location:  Inpatient Procedure: Limited Echo and Intracardiac Opacification Agent Indications:    CHF  History:        Patient has prior history of Echocardiogram examinations, most                  recent 11/12/2020. Risk Factors:Diabetes.  Sonographer:    Helmut Muster Referring Phys: [id number] Greer Ee Lacresia Darwish  Sonographer Comments: Patient is morbidly obese. Image acquisition challenging due to patient body habitus. IMPRESSIONS  1. Left ventricular ejection fraction, by estimation, is 35 to 40%. The left ventricle has moderately decreased function. The left ventricle demonstrates global hypokinesis.  2. Right ventricular systolic function is normal. The right ventricular size is normal. Tricuspid regurgitation signal is inadequate for assessing PA pressure.  3. The inferior vena cava is normal in size with greater than 50% respiratory variability, suggesting right atrial pressure of 3 mmHg.  4. Limited echo with no doppler. FINDINGS  Left Ventricle: Left ventricular ejection fraction, by estimation, is 35 to 40%. The left ventricle has moderately decreased function. The left ventricle demonstrates global hypokinesis. Definity contrast agent was given IV to delineate the left ventricular endocardial borders. The left ventricular internal cavity size was normal in size. There is no left ventricular hypertrophy. Right Ventricle: The right ventricular size is normal. No increase in right ventricular wall thickness. Right ventricular systolic function is normal. Tricuspid regurgitation signal is inadequate for assessing PA pressure. Right Atrium: Right atrial size was normal in size. Pericardium: There is no evidence of pericardial effusion. Venous: The inferior vena cava is normal in size with greater than 50% respiratory variability, suggesting right atrial pressure of 3 mmHg. LEFT VENTRICLE PLAX 2D LVIDd:         5.00 cm LVIDs:         4.30 cm LV PW:  1.00 cm LV IVS:        1.00 cm  LV Volumes (MOD) LV vol d, MOD A2C: 167.0 ml LV vol d, MOD A4C: 160.0 ml LV vol s, MOD A2C: 90.9 ml LV vol s, MOD A4C: 104.0 ml LV SV MOD A2C:     76.1 ml LV SV MOD A4C:     160.0 ml LV SV MOD BP:      70.1 ml Dalton  McleanMD Electronically signed by Wilfred Lacy Signature Date/Time: 08/03/2021/4:39:01 PM    Final     Cardiac Studies   Echocardiogram 11/12/2020: Impressions: 1. Left ventricular ejection fraction, by estimation, is 30%. The left  ventricle has moderately decreased function. The left ventricle has no  regional wall motion abnormalities. The left ventricular internal cavity  size was mildly dilated. Left  ventricular diastolic parameters were normal.   2. Right ventricular systolic function is normal. The right ventricular  size is normal. Mildly increased right ventricular wall thickness.   3. Left atrial size was mildly dilated.   4. The mitral valve is normal in structure. Trivial mitral valve  regurgitation. No evidence of mitral stenosis.   5. The aortic valve is normal in structure. Aortic valve regurgitation is  not visualized. No aortic stenosis is present.   6. The inferior vena cava is normal in size with greater than 50%  respiratory variability, suggesting right atrial pressure of 3 mmHg.   Patient Profile     40 y.o. 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 Cardiology consulted for pre-op evaluation at the request of Dr. Elvera Lennox.  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 Initial TTE with LVEF 15-20% now improved to 35-40% on TTE 08/04/21. RV function normal. Has been compensated and euvolemic during admission. HF medications held in setting of being NPO for SBO. Will add back slowly as tolerated. Home regimen include: Lasix 40mg   daily, Entresto 97-103mg  twice daily, Coreg 3.125mg  twice daily, Spironolactone 25mg  daily, Hydralazine 25mg  three times daily, Isordil 10mg  three times daily, and Farxiga 10mg  daily. -Remains NPO, will add back HF medications as tolerated -Monitor I/Os and daily weights  -Plan for HF follow-up after discharge  #SBO s/p open repair of incarcerate incisional hernia with lysis of adhesions: -Management per surgery  #HTN: -Elevated, however, currently NPO. Will add back as tolerated.  For questions or updates, please contact [facility] Please consult [email or web address] for contact info under     Signed, , MD  08/05/2021, 1:05 PM

## 2021-08-05 NOTE — Plan of Care (Signed)
  Problem: Clinical Measurements: Goal: Will remain free from infection Outcome: Not Progressing   Problem: Elimination: Goal: Will not experience complications related to bowel motility Outcome: Not Progressing   Problem: Pain Managment: Goal: General experience of comfort will improve Outcome: Not Progressing   

## 2021-08-05 NOTE — Evaluation (Signed)
Physical Therapy Evaluation Patient Details Name: [name] MRN: [id number] DOB: 1981-01-20 Today's Date: 08/05/2021  History of Present Illness  40 year old female presented with abdominal pain, nausea and vomiting.  CT scan showed infraumbilical ventral hernia containing multiple small bowel loops.  Small bowel loops in the lower abdomen and pelvis are mildly dilated and fluid-filled, concerning for low-grade small bowel obstruction. s/p repair of incisional hernia and LOA on 08/04/2021. with history of chronic systolic CHF with most recent EF 30% in February 2022, followed by heart failure clinic, OSA on CPAP, HTN, DM2, obesity class III, history of C-section.  Clinical Impression  Pt admitted with above diagnosis. Pt ambulated 100' without an assistive device, distance limited by abdominal pain.Good progress expected.  Pt currently with functional limitations due to the deficits listed below (see PT Problem List). Pt will benefit from skilled PT to increase their independence and safety with mobility to allow discharge to the venue listed below.          Recommendations for follow up therapy are one component of a multi-disciplinary discharge planning process, led by the attending physician.  Recommendations may be updated based on patient status, additional functional criteria and insurance authorization.  Follow Up Recommendations No PT follow up    Assistance Recommended at Discharge Set up Supervision/Assistance  Functional Status Assessment Patient has had a recent decline in their functional status and demonstrates the ability to make significant improvements in function in a reasonable and predictable amount of time.  Equipment Recommendations  None recommended by PT    Recommendations for Other Services       Precautions / Restrictions Precautions Precaution Comments: NG tube, JP on R Restrictions Weight Bearing Restrictions: No      Mobility  Bed Mobility Overal  bed mobility: Needs Assistance Bed Mobility: Supine to Sit     Supine to sit: Min assist;HOB elevated     General bed mobility comments: assist to raise trunk, HOB up, used rail    Transfers Overall transfer level: Needs assistance Equipment used: 1 person hand held assist Transfers: Sit to/from Stand Sit to Stand: Min assist           General transfer comment: min A to steady initially upon standing    Ambulation/Gait Ambulation/Gait assistance: Min guard Gait Distance (Feet): 100 Feet Assistive device: None Gait Pattern/deviations: Step-through pattern;Decreased stride length Gait velocity: decr     General Gait Details: steady, no loss of balance, distance limited by pain  Stairs            Wheelchair Mobility    Modified Rankin (Stroke Patients Only)       Balance Overall balance assessment: Needs assistance   Sitting balance-Leahy Scale: Good       Standing balance-Leahy Scale: Good                               Pertinent Vitals/Pain Pain Assessment: 0-10 Pain Score: 6  Pain Location: abdominal incision Pain Descriptors / Indicators: Operative site guarding;Grimacing Pain Intervention(s): Limited activity within patient's tolerance;Monitored during session;Premedicated before session    Home Living Family/patient expects to be discharged to:: Private residence Living Arrangements: Spouse/significant other;Children Available Help at Discharge: Family;Available PRN/intermittently   Home Access: Level entry     Alternate Level Stairs-Number of Steps: flight Home Layout: Two level;Bed/bath upstairs Home Equipment: None Additional Comments: lives with spouse and child who are gone during the day  Prior Function Prior Level of Function : Independent/Modified Independent                     Hand Dominance        Extremity/Trunk Assessment   Upper Extremity Assessment Upper Extremity Assessment: Overall WFL for  tasks assessed    Lower Extremity Assessment Lower Extremity Assessment: Overall WFL for tasks assessed    Cervical / Trunk Assessment Cervical / Trunk Assessment: Normal  Communication   Communication: No difficulties  Cognition Arousal/Alertness: Awake/alert Behavior During Therapy: Flat affect Overall Cognitive Status: Within Functional Limits for tasks assessed                                          General Comments      Exercises     Assessment/Plan    PT Assessment Patient needs continued PT services  PT Problem List Decreased activity tolerance;Decreased balance;Pain;Decreased mobility       PT Treatment Interventions Gait training;Balance training    PT Goals (Current goals can be found in the Care Plan section)  Acute Rehab PT Goals Patient Stated Goal: decrease pain PT Goal Formulation: With patient Time For Goal Achievement: 08/19/21 Potential to Achieve Goals: Good    Frequency Min 3X/week   Barriers to discharge        Co-evaluation               AM-PAC PT "6 Clicks" Mobility  Outcome Measure Help needed turning from your back to your side while in a flat bed without using bedrails?: A Little Help needed moving from lying on your back to sitting on the side of a flat bed without using bedrails?: A Little Help needed moving to and from a bed to a chair (including a wheelchair)?: A Little Help needed standing up from a chair using your arms (e.g., wheelchair or bedside chair)?: A Little Help needed to walk in hospital room?: A Little Help needed climbing 3-5 steps with a railing? : A Little 6 Click Score: 18    End of Session   Activity Tolerance: Patient tolerated treatment well Patient left: in chair;with call bell/phone within reach;with chair alarm set Nurse Communication: Mobility status PT Visit Diagnosis: Pain;Difficulty in walking, not elsewhere classified (R26.2)    Time: [id number] PT Time Calculation (min)  (ACUTE ONLY): 19 min   Charges:   PT Evaluation $PT Eval Moderate Complexity: 1 Mod          Philomena Doheny PT 08/05/2021  Acute Rehabilitation Services Pager [phone] Office [phone]

## 2021-08-05 NOTE — Plan of Care (Signed)
  Problem: Clinical Measurements: Goal: Ability to maintain clinical measurements within normal limits will improve Outcome: Progressing   Problem: Activity: Goal: Risk for activity intolerance will decrease Outcome: Progressing   Problem: Pain Managment: Goal: General experience of comfort will improve Outcome: Progressing   

## 2021-08-05 NOTE — Discharge Instructions (Addendum)
[facility]      [facility] °[phone] ° °OPEN ABDOMINAL SURGERY: POST OP INSTRUCTIONS ° °Always review your discharge instruction sheet given to you by the facility where your surgery was performed. ° °IF YOU HAVE DISABILITY OR FAMILY LEAVE FORMS, YOU MUST BRING THEM TO THE OFFICE FOR PROCESSING.  PLEASE DO NOT GIVE THEM TO YOUR DOCTOR. ° °A prescription for pain medication may be given to you upon discharge.  Take your pain medication as prescribed, if needed.  If narcotic pain medicine is not needed, then you may take acetaminophen (Tylenol) or ibuprofen (Advil) as needed. °Take your usually prescribed medications unless otherwise directed. °If you need a refill on your pain medication, please contact your pharmacy. They will contact our office to request authorization.  Prescriptions will not be filled after 5pm or on week-ends. °You should follow a light diet the first few days after arrival home, such as soup and crackers, pudding, etc.unless your doctor has advised otherwise. A high-fiber, low fat diet can be resumed as tolerated.   Be sure to include lots of fluids daily. Most patients will experience some swelling and bruising on the chest and neck area.  Ice packs will help.  Swelling and bruising can take several days to resolve °Most patients will experience some swelling and bruising in the area of the incision. Ice pack will help. Swelling and bruising can take several days to resolve..  °It is common to experience some constipation if taking pain medication after surgery.  Increasing fluid intake and taking a stool softener will usually help or prevent this problem from occurring.  A mild laxative (Milk of Magnesia or Miralax) should be taken according to package directions if there are no bowel movements after 48 hours. ° You may have steri-strips (small skin tapes) in place directly over the incision.  These strips should be left on the skin for 7-10 days.  If your surgeon used skin  glue on the incision, you may shower in 24 hours.  The glue will flake off over the next 2-3 weeks.  Any sutures or staples will be removed at the office during your follow-up visit. You may find that a light gauze bandage over your incision may keep your staples from being rubbed or pulled. You may shower and replace the bandage daily. °ACTIVITIES:  You may resume regular (light) daily activities beginning the next day--such as daily self-care, walking, climbing stairs--gradually increasing activities as tolerated.  You may have sexual intercourse when it is comfortable.  Refrain from any heavy lifting or straining until approved by your doctor. °You may drive when you no longer are taking prescription pain medication, you can comfortably wear a seatbelt, and you can safely maneuver your car and apply brakes ° °You should see your doctor in the office for a follow-up appointment approximately two weeks after your surgery.  Make sure that you call for this appointment within a day or two after you arrive home to insure a convenient appointment time. ° °WHEN TO CALL YOUR DOCTOR: °Fever over 101.0 °Inability to urinate °Nausea and/or vomiting °Extreme swelling or bruising °Continued bleeding from incision. °Increased pain, redness, or drainage from the incision. °Difficulty swallowing or breathing °Muscle cramping or spasms. °Numbness or tingling in hands or feet or around lips. ° °The clinic staff is available to answer your questions during regular business hours.  Please don’t hesitate to call and ask to speak to one of the nurses if you have concerns. ° °For   further questions, please visit [email or web address]  °

## 2021-08-05 NOTE — Progress Notes (Signed)
Patient ID: [name], female   DOB: [birth year], 40 y.o.   MRN: [id number] [facility] Surgery Progress Note  1 Day Post-Op  Subjective: CC-  Sore and thirsty this morning. Pain medication helps but does not last long enough. Denies n/v. No flatus or BM. Has not been OOB since surgery.  Objective: Vital signs in last 24 hours: Temp:  [97.5 F (36.4 C)-98.7 F (37.1 C)] 97.9 F (36.6 C) (11/18 0533) Pulse Rate:  [84-100] 90 (11/18 0533) Resp:  [11-18] 16 (11/18 0533) BP: (130-171)/(74-92) 130/74 (11/18 0533) SpO2:  [94 %-99 %] 98 % (11/18 0533) Last BM Date: 08/02/21  Intake/Output from previous day: 11/17 0701 - 11/18 0700 In: 1823 [P.O.:623; I.V.:1100; IV Piggyback:100] Out: 2273 [Urine:800; Emesis/NG output:1350; Drains:98; Blood:25] Intake/Output this shift: No intake/output data recorded.  PE: Gen:  Alert, NAD Pulm: rate and effort normal Abd: soft, appropriately tender over incision, incision cdi with staples present and honeycomb dressing in place, JP drain with serosanguinous fluid  Lab Results:  Recent Labs    08/04/21 0312 08/05/21 0754  WBC 17.8* 13.9*  HGB 12.8 11.9*  HCT 42.3 38.8  PLT 301 289   BMET Recent Labs    08/04/21 0312 08/05/21 0754  NA 140 140  K 3.6 3.5  CL 102 104  CO2 29 31  GLUCOSE 114* 140*  BUN 10 9  CREATININE 0.94 0.91  CALCIUM 9.0 8.4*   PT/INR No results for input(s): LABPROT, INR in the last 72 hours. CMP     Component Value Date/Time   NA 140 08/05/2021 0754   K 3.5 08/05/2021 0754   CL 104 08/05/2021 0754   CO2 31 08/05/2021 0754   GLUCOSE 140 (H) 08/05/2021 0754   BUN 9 08/05/2021 0754   CREATININE 0.91 08/05/2021 0754   CREATININE 0.80 09/02/2019 0914   CALCIUM 8.4 (L) 08/05/2021 0754   PROT 7.5 07/31/2021 1911   ALBUMIN 3.9 07/31/2021 1911   AST 9 (L) 07/31/2021 1911   ALT 8 07/31/2021 1911   ALKPHOS 137 (H) 07/31/2021 1911   BILITOT 0.6 07/31/2021 1911   GFRNONAA >60 08/05/2021 0754    GFRAA >60 09/19/2018 1315   Lipase     Component Value Date/Time   LIPASE 12 07/31/2021 1911       Studies/Results: ECHOCARDIOGRAM LIMITED  Result Date: 08/03/2021    ECHOCARDIOGRAM LIMITED REPORT   Patient Name:   [name] Date of Exam: 08/03/2021 Medical Rec #:  [id number]         Height:       68.0 in Accession #:    [id number]        Weight:       286.2 lb Date of Birth:  1980/12/05         BSA:          2.380 m Patient Age:    40 years          BP:           144/85 mmHg Patient Gender: F                 HR:           89 bpm. Exam Location:  Inpatient Procedure: Limited Echo and Intracardiac Opacification Agent Indications:    CHF  History:        Patient has prior history of Echocardiogram examinations, most  recent 11/12/2020. Risk Factors:Diabetes.  Sonographer:    Alvera Novel Referring Phys: [id number] Kathlynn Grate [place]  Sonographer Comments: Patient is morbidly obese. Image acquisition challenging due to patient body habitus. IMPRESSIONS  1. Left ventricular ejection fraction, by estimation, is 35 to 40%. The left ventricle has moderately decreased function. The left ventricle demonstrates global hypokinesis.  2. Right ventricular systolic function is normal. The right ventricular size is normal. Tricuspid regurgitation signal is inadequate for assessing PA pressure.  3. The inferior vena cava is normal in size with greater than 50% respiratory variability, suggesting right atrial pressure of 3 mmHg.  4. Limited echo with no doppler. FINDINGS  Left Ventricle: Left ventricular ejection fraction, by estimation, is 35 to 40%. The left ventricle has moderately decreased function. The left ventricle demonstrates global hypokinesis. Definity contrast agent was given IV to delineate the left ventricular endocardial borders. The left ventricular internal cavity size was normal in size. There is no left ventricular hypertrophy. Right Ventricle: The right ventricular size is  normal. No increase in right ventricular wall thickness. Right ventricular systolic function is normal. Tricuspid regurgitation signal is inadequate for assessing PA pressure. Right Atrium: Right atrial size was normal in size. Pericardium: There is no evidence of pericardial effusion. Venous: The inferior vena cava is normal in size with greater than 50% respiratory variability, suggesting right atrial pressure of 3 mmHg. LEFT VENTRICLE PLAX 2D LVIDd:         5.00 cm LVIDs:         4.30 cm LV PW:         1.00 cm LV IVS:        1.00 cm  LV Volumes (MOD) LV vol d, MOD A2C: 167.0 ml LV vol d, MOD A4C: 160.0 ml LV vol s, MOD A2C: 90.9 ml LV vol s, MOD A4C: 104.0 ml LV SV MOD A2C:     76.1 ml LV SV MOD A4C:     160.0 ml LV SV MOD BP:      70.1 ml Dalton McleanMD Electronically signed by Wilfred Lacy Signature Date/Time: 08/03/2021/4:39:01 PM    Final     Anti-infectives: Anti-infectives (From admission, onward)    Start     Dose/Rate Route Frequency Ordered Stop   08/04/21 0930  ertapenem (INVANZ) 1,000 mg in sodium chloride 0.9 % 100 mL IVPB        1 g 200 mL/hr over 30 Minutes Intravenous On call to O.R. 08/04/21 0730 08/04/21 1143        Assessment/Plan Incarcerated incisional hernia with small bowel obstruction -POD#1 s/p diagnostic laparoscopy, open primary repair of incarcerated incisional hernia with lysis of adhesions (20 minutes) 11/17 Dr. Gerrit Friends - continue NPO/NGT to [facility] and await return in bowel function - needs to mobilize, will ask PT to see - d/c foley today when mobilizing - continue drain (in subcutaneous tissue) - schedule IV tylenol and IV robaxin for better pain control  ID - invanz periop FEN - IVF, NPO/NGT to LIWS VTE - SCDs, lovenox Foley - d/c 11/18  Chronic Biventricular Heart Failure - followed by Dr. Gala Romney, EF 30% on ECHO 11/12/20 HTN DM OSA Obesity BMI 43.51   LOS: 4 days    Franne Forts, [facility] Surgery 08/05/2021, 10:29 AM Please  see Amion for pager number during day hours 7:00am-4:30pm

## 2021-08-05 NOTE — Progress Notes (Signed)
Patient has refused incentive spirometry, instructed on importance and benefits, needs reinforcement.

## 2021-08-06 DIAGNOSIS — I5022 Chronic systolic (congestive) heart failure: Secondary | ICD-10-CM

## 2021-08-06 LAB — CBC
HCT: 37.6 % (ref 36.0–46.0)
Hemoglobin: 11.6 g/dL — ABNORMAL LOW (ref 12.0–15.0)
MCH: 23 pg — ABNORMAL LOW (ref 26.0–34.0)
MCHC: 30.9 g/dL (ref 30.0–36.0)
MCV: 74.6 fL — ABNORMAL LOW (ref 80.0–100.0)
Platelets: 278 10*3/uL (ref 150–400)
RBC: 5.04 MIL/uL (ref 3.87–5.11)
RDW: 15.9 % — ABNORMAL HIGH (ref 11.5–15.5)
WBC: 14.2 10*3/uL — ABNORMAL HIGH (ref 4.0–10.5)
nRBC: 0 % (ref 0.0–0.2)

## 2021-08-06 LAB — BASIC METABOLIC PANEL
Anion gap: 6 (ref 5–15)
BUN: 8 mg/dL (ref 6–20)
CO2: 32 mmol/L (ref 22–32)
Calcium: 8.3 mg/dL — ABNORMAL LOW (ref 8.9–10.3)
Chloride: 100 mmol/L (ref 98–111)
Creatinine, Ser: 0.79 mg/dL (ref 0.44–1.00)
GFR, Estimated: >60 mL/min (ref >60)
Glucose, Bld: 123 mg/dL — ABNORMAL HIGH (ref 70–99)
Potassium: 3.1 mmol/L — ABNORMAL LOW (ref 3.5–5.1)
Sodium: 138 mmol/L (ref 135–145)

## 2021-08-06 MED ORDER — POTASSIUM CHLORIDE 10 MEQ/100ML IV SOLN
10.0000 meq | INTRAVENOUS | Status: AC
  Administered 2021-08-06 (×4): 10 meq via INTRAVENOUS
  Filled 2021-08-06 (×4): qty 100

## 2021-08-06 NOTE — Progress Notes (Signed)
Progress Note  Patient Name: [name] Date of Encounter: 08/06/2021  [facility] Cardiologist: Skeet Latch, MD   Subjective   No CP or dyspnea; describes some gas and abdominal pain  Inpatient Medications    Scheduled Meds:  Chlorhexidine Gluconate Cloth  6 each Topical Daily   enoxaparin (LOVENOX) injection  40 mg Subcutaneous Q24H   Continuous Infusions:  dextrose 5 % and 0.45 % NaCl with KCl 20 mEq/L 100 mL/hr at 08/06/21 0323   methocarbamol (ROBAXIN) IV 500 mg (08/06/21 0738)   potassium chloride     PRN Meds: acetaminophen **OR** acetaminophen, HYDROmorphone (DILAUDID) injection, ondansetron **OR** ondansetron (ZOFRAN) IV   Vital Signs    Vitals:   08/05/21 1240 08/05/21 1800 08/05/21 2046 08/06/21 0532  BP: (!) 152/91  (!) 143/92 (!) 148/89  Pulse: 87  90 76  Resp: 18  18 20   Temp:  99.5 F (37.5 C) 99.2 F (37.3 C) 99.2 F (37.3 C)  TempSrc:  Oral Oral Oral  SpO2: 94%  95% 96%  Weight:      Height:        Intake/Output Summary (Last 24 hours) at 08/06/2021 0939 Last data filed at 08/06/2021 0600 Gross per 24 hour  Intake 3807.72 ml  Output 2090 ml  Net 1717.72 ml   Last 3 Weights 08/04/2021 08/01/2021 03/29/2021  Weight (lbs) 286 lb 2.5 oz 286 lb 2.5 oz 275 lb 6.4 oz  Weight (kg) 129.8 kg 129.8 kg 124.921 kg      Physical Exam   GEN: No acute distress.   Neck: No JVD Cardiac: RRR, no murmurs, rubs, or gallops.  Respiratory: Clear to auscultation bilaterally. GI: Soft, s/p abdominal surgery MS: No edema Neuro:  Nonfocal  Psych: Normal affect   Labs   Chemistry Recent Labs  Lab 07/31/21 1911 08/01/21 0319 08/03/21 0325 08/04/21 0312 08/05/21 0754 08/06/21 0645  NA 140   < > 139 140 140 138  K 3.5   < > 3.6 3.6 3.5 3.1*  CL 101   < > 101 102 104 100  CO2 30   < > 26 29 31  32  GLUCOSE 107*   < > 117* 114* 140* 123*  BUN 7   < > 10 10 9 8   CREATININE 0.77   < > 0.82 0.94 0.91 0.79  CALCIUM 9.3   < > 9.0 9.0  8.4* 8.3*  MG  --   --  2.4  --  2.1  --   PROT 7.5  --   --   --   --   --   ALBUMIN 3.9  --   --   --   --   --   AST 9*  --   --   --   --   --   ALT 8  --   --   --   --   --   ALKPHOS 137*  --   --   --   --   --   BILITOT 0.6  --   --   --   --   --   GFRNONAA >60   < > >60 >60 >60 >60  ANIONGAP 9   < > 12 9 5 6    < > = values in this interval not displayed.    Hematology Recent Labs  Lab 08/04/21 0312 08/05/21 0754 08/06/21 0645  WBC 17.8* 13.9* 14.2*  RBC 5.65* 5.21* 5.04  HGB 12.8 11.9* 11.6*  HCT 42.3 38.8 37.6  MCV 74.9* 74.5* 74.6*  MCH 22.7* 22.8* 23.0*  MCHC 30.3 30.7 30.9  RDW 16.3* 15.9* 15.9*  PLT 301 289 278    Patient Profile     40 y.o. 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 Cardiology consulted for pre-op evaluation at the request of Dr. Elvera Lennox.  Echocardiogram this admission shows ejection fraction 35 to 40%.  Assessment & Plan    1 nonischemic cardiomyopathy-chronic systolic congestive heart failure-patient is euvolemic on examination.  NG tube remains in place.  When she is able would resume home dose of Entresto, carvedilol, hydralazine and isordil.  Would resume [place] as well.  Resume home dose of Lasix and spironolactone at discharge.  Will need follow-up in CHF clinic 3 to 4 weeks after discharge.  2.  Status post abdominal surgery-per general surgery.  3 hypertension-patient's blood pressure is elevated.  This will improve once CHF medications are able to be reinitiated.  Cardiology will sign off.  Please call with questions. Would resume preadmission medications at  discharge.  For questions or updates, please contact [facility] Please consult [email or web address] for contact info under        Signed, Olga Millers, MD  08/06/2021, 9:39 AM

## 2021-08-06 NOTE — Progress Notes (Signed)
2 Days Post-Op   Subjective/Chief Complaint: Pt doing well today Passing some flatus    Objective: Vital signs in last 24 hours: Temp:  [99.2 F (37.3 C)-99.5 F (37.5 C)] 99.2 F (37.3 C) (11/19 0532) Pulse Rate:  [76-90] 76 (11/19 0532) Resp:  [18-20] 20 (11/19 0532) BP: (143-152)/(89-92) 148/89 (11/19 0532) SpO2:  [94 %-96 %] 96 % (11/19 0532) Last BM Date: 08/02/21  Intake/Output from previous day: 11/18 0701 - 11/19 0700 In: 3807.7 [P.O.:90; I.V.:3267.7; IV Piggyback:450] Out: 2090 [Urine:200; Emesis/NG output:1850; Drains:40] Intake/Output this shift: No intake/output data recorded.  PE:  Constitutional: No acute distress, conversant, appears states age. Eyes: Anicteric sclerae, moist conjunctiva, no lid lag Lungs: Clear to auscultation bilaterally, normal respiratory effort CV: regular rate and rhythm, no murmurs, no peripheral edema, pedal pulses 2+ GI: Soft, no masses or hepatosplenomegaly, non-tender to palpation, inc c/d/I Skin: No rashes, palpation reveals normal turgor Psychiatric: appropriate judgment and insight, oriented to person, place, and time   Lab Results:  Recent Labs    08/05/21 0754 08/06/21 0645  WBC 13.9* 14.2*  HGB 11.9* 11.6*  HCT 38.8 37.6  PLT 289 278   BMET Recent Labs    08/05/21 0754 08/06/21 0645  NA 140 138  K 3.5 3.1*  CL 104 100  CO2 31 32  GLUCOSE 140* 123*  BUN 9 8  CREATININE 0.91 0.79  CALCIUM 8.4* 8.3*   Anti-infectives: Anti-infectives (From admission, onward)    Start     Dose/Rate Route Frequency Ordered Stop   08/04/21 0930  ertapenem (INVANZ) 1,000 mg in sodium chloride 0.9 % 100 mL IVPB        1 g 200 mL/hr over 30 Minutes Intravenous On call to O.R. 08/04/21 0730 08/04/21 1143       Assessment/Plan: Incarcerated incisional hernia with small bowel obstruction -POD#2 s/p diagnostic laparoscopy, open primary repair of incarcerated incisional hernia with lysis of adhesions (20 minutes) 11/17 Dr.  Gerrit Friends - will DC NGT and start full liq diet - continue drain (in subcutaneous tissue) - schedule IV tylenol and IV robaxin for better pain control    ID - none FEN - IVF, NPO/NGT to LIWS VTE - SCDs, lovenox Foley - d/c 11/18   Chronic Biventricular Heart Failure - followed by Dr. Gala Romney, EF 30% on ECHO 11/12/20 HTN DM OSA Obesity BMI 43.51   LOS: 5 days    Axel Filler 08/06/2021

## 2021-08-06 NOTE — Progress Notes (Signed)
PROGRESS NOTE  [name] MOQ:[id number] DOB: [birth year] DOA: 07/31/2021 PCP: Donato Schultz, DO   LOS: 5 days   Brief Narrative / Interim history: 40 year old female with history of chronic systolic CHF with most recent EF 30% in February 2022, followed by heart failure clinic, OSA on CPAP, HTN, DM2, obesity class III, history of C-section comes to the hospital with abdominal pain, nausea and vomiting.  CT scan showed infraumbilical ventral hernia containing multiple small bowel loops.  Small bowel loops in the lower abdomen and pelvis are mildly dilated and fluid-filled, concerning for low-grade small bowel obstruction.  General surgery was consulted and she was admitted to the hospital.  Subjective / 24h Interval events: No acute issues or events overnight denies nausea vomiting diarrhea constipation headache fevers chills or chest pain.  Reports flatus overnight no bowel movement at this time, abdominal pain moderately well controlled on current regimen  Assessment & Plan:  Intractable nausea and vomiting and abdominal pain secondary to infraumbilical ventral hernia, complicated by small bowel obstruction, POA, resolving - General surgery following - status post diagnostic laparoscopy, open primary repair of incarcerated incisional hernia with lysis of adhesions 08/04/21 - JP drain/postsurgical care including NG tube management and diet per surgery -possibly able to transition to clears per surgical note and clamping trial of NG tube  Chronic systolic CHF, not in acute exacerbation -Cardiology following, repeat echo shows EF 35 to 40% with global hypokinesis of the left ventricle -Non compliant with medications for 2+ weeks -Continue low rate LR 20 cc/h x 1 L perioperatively to maintain euvolemia   OSA-continue CPAP  Essential hypertension-strict n.p.o., monitor blood pressure   DM2-continue sliding scale   Obesity, class III-BMI 43, she would benefit from weight  loss   Diet Orders (From admission, onward)     Start     Ordered   08/05/21 0827  Diet NPO time specified Except for: Ice Chips, Sips with Meds, Other (See Comments)  Diet effective now       Comments: Ok for ice chips and a popsickle  Question Answer Comment  Except for [employer]   Except for Sips with Meds   Except for Other (See Comments)      08/05/21 0827            DVT prophylaxis: enoxaparin (LOVENOX) injection 40 mg Start: 08/05/21 0800 SCD's Start: 08/04/21 1458     Code Status: Full Code  Family Communication: None present  Status is: Inpatient  Remains inpatient appropriate because: Need for surgical intervention  Level of care: Med-Surg  Consultants:  General surgery  Cardiology   Procedures:  Hernia repair and reduction 08/04/2021  Microbiology  none  Antimicrobials: Perioperatively   Objective: Vitals:   08/05/21 1240 08/05/21 1800 08/05/21 2046 08/06/21 0532  BP: (!) 152/91  (!) 143/92 (!) 148/89  Pulse: 87  90 76  Resp: 18  18 20   Temp:  99.5 F (37.5 C) 99.2 F (37.3 C) 99.2 F (37.3 C)  TempSrc:  Oral Oral Oral  SpO2: 94%  95% 96%  Weight:      Height:        Intake/Output Summary (Last 24 hours) at 08/06/2021 0726 Last data filed at 08/06/2021 0600 Gross per 24 hour  Intake 3807.72 ml  Output 2090 ml  Net 1717.72 ml    Filed Weights   08/01/21 0054 08/04/21 0935  Weight: 129.8 kg 129.8 kg    Examination: General:  Pleasantly resting in bed, No  acute distress. HEENT:  Normocephalic atraumatic.  Sclerae nonicteric, noninjected.  NG tube intact dark green output extraocular movements intact bilaterally. Neck:  Without mass or deformity.  Trachea is midline. Lungs:  Clear to auscultate bilaterally without rhonchi, wheeze, or rales. Heart:  Regular rate and rhythm.  Without murmurs, rubs, or gallops. Abdomen: Diffusely tender, postsurgical dressing clean dry intact, JP drain intact. Extremities: Without cyanosis,  clubbing, edema, or obvious deformity. Vascular:  Dorsalis pedis and posterior tibial pulses palpable bilaterally. Skin:  Warm and dry, no erythema, no ulcerations.   Data Reviewed: I have independently reviewed following labs and imaging studies  CBC: Recent Labs  Lab 07/31/21 1911 08/03/21 0325 08/04/21 0312 08/05/21 0754 08/06/21 0645  WBC 16.1* 18.9* 17.8* 13.9* 14.2*  HGB 12.3 13.7 12.8 11.9* 11.6*  HCT 39.8 46.2* 42.3 38.8 37.6  MCV 73.4* 75.2* 74.9* 74.5* 74.6*  PLT 304 325 301 289 0000000    Basic Metabolic Panel: Recent Labs  Lab 08/01/21 0319 08/03/21 0325 08/04/21 0312 08/05/21 0754 08/06/21 0645  NA 139 139 140 140 138  K 3.4* 3.6 3.6 3.5 3.1*  CL 101 101 102 104 100  CO2 27 26 29 31  32  GLUCOSE 125* 117* 114* 140* 123*  BUN 7 10 10 9 8   CREATININE 0.57 0.82 0.94 0.91 0.79  CALCIUM 8.9 9.0 9.0 8.4* 8.3*  MG  --  2.4  --  2.1  --     Liver Function Tests: Recent Labs  Lab 07/31/21 1911  AST 9*  ALT 8  ALKPHOS 137*  BILITOT 0.6  PROT 7.5  ALBUMIN 3.9    Coagulation Profile: No results for input(s): INR, PROTIME in the last 168 hours. HbA1C: No results for input(s): HGBA1C in the last 72 hours.  CBG: Recent Labs  Lab 08/04/21 0000 08/04/21 0350 08/04/21 0724 08/04/21 0923 08/04/21 1623  GLUCAP 112* 126* 105* 102* 139*     Recent Results (from the past 240 hour(s))  Resp Panel by RT-PCR (Flu A&B, Covid) Nasopharyngeal Swab     Status: None   Collection Time: 07/31/21 10:50 PM   Specimen: Nasopharyngeal Swab; Nasopharyngeal(NP) swabs in vial transport medium  Result Value Ref Range Status   SARS Coronavirus 2 by RT PCR NEGATIVE NEGATIVE Final    Comment: (NOTE) SARS-CoV-2 target nucleic acids are NOT DETECTED.  The SARS-CoV-2 RNA is generally detectable in upper respiratory specimens during the acute phase of infection. The lowest concentration of SARS-CoV-2 viral copies this assay can detect is 138 copies/mL. A negative result does  not preclude SARS-Cov-2 infection and should not be used as the sole basis for treatment or other patient management decisions. A negative result may occur with  improper specimen collection/handling, submission of specimen other than nasopharyngeal swab, presence of viral mutation(s) within the areas targeted by this assay, and inadequate number of viral copies(<138 copies/mL). A negative result must be combined with clinical observations, patient history, and epidemiological information. The expected result is Negative.  Fact Sheet for Patients:  EntrepreneurPulse.com.au  Fact Sheet for Healthcare Providers:  IncredibleEmployment.be  This test is no t yet approved or cleared by the [place] FDA and  has been authorized for detection and/or diagnosis of SARS-CoV-2 by FDA under an Emergency Use Authorization (EUA). This EUA will remain  in effect (meaning this test can be used) for the duration of the COVID-19 declaration under Section 564(b)(1) of the Act, 21 U.S.C.section 360bbb-3(b)(1), unless the authorization is terminated  or revoked sooner.  Influenza A by PCR NEGATIVE NEGATIVE Final   Influenza B by PCR NEGATIVE NEGATIVE Final    Comment: (NOTE) The Xpert Xpress SARS-CoV-2/FLU/RSV plus assay is intended as an aid in the diagnosis of influenza from Nasopharyngeal swab specimens and should not be used as a sole basis for treatment. Nasal washings and aspirates are unacceptable for Xpert Xpress SARS-CoV-2/FLU/RSV testing.  Fact Sheet for Patients: EntrepreneurPulse.com.au  Fact Sheet for Healthcare Providers: IncredibleEmployment.be  This test is not yet approved or cleared by the [place] FDA and has been authorized for detection and/or diagnosis of SARS-CoV-2 by FDA under an Emergency Use Authorization (EUA). This EUA will remain in effect (meaning this test can be used) for the  duration of the COVID-19 declaration under Section 564(b)(1) of the Act, 21 U.S.C. section 360bbb-3(b)(1), unless the authorization is terminated or revoked.  Performed at [facility], [address] [place], [place] [address]   Surgical pcr screen     Status: None   Collection Time: 08/03/21  9:37 AM   Specimen: Nasal Mucosa; Nasal Swab  Result Value Ref Range Status   MRSA, PCR NEGATIVE NEGATIVE Final   Staphylococcus aureus NEGATIVE NEGATIVE Final    Comment: (NOTE) The Xpert SA Assay (FDA approved for NASAL specimens in patients 60 years of age and older), is one component of a comprehensive surveillance program. It is not intended to diagnose infection nor to guide or monitor treatment. Performed at [facility], [place] [address], [place],  [address]       Radiology Studies: No results found.  Williamm Dereke Neumann DO [facility]  Between 7 am - 7 pm I am available, please contact me via Amion (for emergencies) or Securechat (non urgent messages)  Between 7 pm - 7 am I am not available, please contact night coverage MD/APP via Amion

## 2021-08-07 DIAGNOSIS — K56609 Unspecified intestinal obstruction, unspecified as to partial versus complete obstruction: Secondary | ICD-10-CM | POA: Diagnosis not present

## 2021-08-07 MED ORDER — SACUBITRIL-VALSARTAN 97-103 MG PO TABS
1.0000 | ORAL_TABLET | Freq: Two times a day (BID) | ORAL | Status: DC
  Administered 2021-08-07: 1 via ORAL
  Filled 2021-08-07 (×2): qty 1

## 2021-08-07 MED ORDER — ACETAMINOPHEN 500 MG PO TABS: 1000.0000 mg | ORAL_TABLET | Freq: Four times a day (QID) | ORAL | Status: AC | PRN

## 2021-08-07 MED ORDER — METHOCARBAMOL 500 MG PO TABS: 500.0000 mg | ORAL_TABLET | Freq: Four times a day (QID) | ORAL | 0 refills | Status: AC | PRN

## 2021-08-07 MED ORDER — CARVEDILOL 3.125 MG PO TABS
3.1250 mg | ORAL_TABLET | Freq: Two times a day (BID) | ORAL | Status: DC
  Administered 2021-08-07: 3.125 mg via ORAL
  Filled 2021-08-07: qty 1

## 2021-08-07 NOTE — Progress Notes (Signed)
Discharge instructions discussed with patient, including drain care, verbalized agreement and understanding

## 2021-08-07 NOTE — Discharge Summary (Signed)
Physician Discharge Summary  MARDA MIR [id number] DOB: 03/28/1981 DOA: 07/31/2021  PCP: Ann Held, DO  Admit date: 07/31/2021 Discharge date: 08/07/2021  Admitted From: Home Disposition: Home  Recommendations for Outpatient Follow-up:  Follow up with PCP in 1-2 weeks Please obtain BMP/CBC in one week Please follow up with surgery in 1 week for drain evaluation and removal  Home Health: None Equipment/Devices: None  Discharge Condition: Stable CODE STATUS: Full Diet recommendation: Soft diet, advance as tolerated back to regular  Brief/Interim Summary: 40 year old female with history of chronic systolic CHF with most recent EF 30% in February 2022, followed by heart failure clinic, OSA on CPAP, HTN, DM2, obesity class III, history of C-section comes to the hospital with abdominal pain, nausea and vomiting.  CT scan showed infraumbilical ventral hernia containing multiple small bowel loops.  Small bowel loops in the lower abdomen and pelvis are mildly dilated and fluid-filled, concerning for low-grade small bowel obstruction.  General surgery was consulted and she was admitted to the hospital.  Patient failed conservative measures requiring laparoscopic and open primary repair of incarcerated incisional hernia with lysis of adhesions on 08/04/2021.  NG tube removed 08/06/2021 with advancing diet and tolerance of p.o., now having multiple bowel movements and flatus, otherwise stable from a surgical standpoint for discharge with JP drain, outpatient follow-up in 1 week for drain evaluation and removal.   Assessment and plan:  Intractable nausea and vomiting and abdominal pain secondary to infraumbilical ventral incarcerated hernia, complicated by small bowel obstruction, POA, resolving - General surgery following - status post diagnostic laparoscopy, open primary repair of incarcerated incisional hernia with lysis of adhesions [id number]  Chronic systolic CHF, not in  acute exacerbation -Cardiology signing off, repeat echo shows EF 35 to 40% with global hypokinesis of the left ventricle - follow outpatient in 2-3 weeks as scheduled -Card recommends :resume home dose of Entresto, carvedilol - we will continue to hold hydralazine and isordil at discharge given well-controlled blood pressure at this point -Resume Farxiga, Lasix, and spironolactone at discharge per cardiology recommendations.   OSA-continue CPAP  Essential hypertension-strict n.p.o., monitor blood pressure   DM2-continue home medications   Obesity, class III-BMI 43, she would benefit from weight loss   Discharge Instructions   Allergies as of 08/07/2021       Reactions   Clomiphene Hives   Hives and lips swelling.        Medication List     STOP taking these medications    aspirin 81 MG chewable tablet   Corlanor 7.5 MG Tabs tablet Generic drug: ivabradine   hydrALAZINE 50 MG tablet Commonly known as: APRESOLINE   isosorbide dinitrate 10 MG tablet Commonly known as: ISORDIL       TAKE these medications    acetaminophen 500 MG tablet Commonly known as: TYLENOL Take 2 tablets (1,000 mg total) by mouth every 6 (six) hours as needed for up to 10 days for mild pain or moderate pain.   albuterol 108 (90 Base) MCG/ACT inhaler Commonly known as: ProAir HFA Inhale 2 puffs into the lungs every 6 (six) hours as needed. What changed: reasons to take this   calcium carbonate 500 MG chewable tablet Commonly known as: TUMS - dosed in mg elemental calcium Chew 1-2 tablets by mouth daily as needed for indigestion or heartburn.   carvedilol 3.125 MG tablet Commonly known as: COREG TAKE 1 TABLET (3.125 MG TOTAL) BY MOUTH TWO TIMES DAILY WITH A MEAL. What changed: how much  to take   diphenhydramine-acetaminophen 25-500 MG Tabs tablet Commonly known as: TYLENOL PM Take 1 tablet by mouth at bedtime as needed (sleep).   Entresto 97-103 MG Generic drug:  sacubitril-valsartan Take 1 tablet by mouth 2 (two) times daily.   Farxiga 10 MG Tabs tablet Generic drug: dapagliflozin propanediol TAKE 1 TABLET (10 MG TOTAL) BY MOUTH DAILY BEFORE BREAKFAST. What changed: how much to take   FLUoxetine 20 MG capsule Commonly known as: PROzac Take 1 capsule (20 mg total) by mouth daily.   furosemide 20 MG tablet Commonly known as: LASIX Take 2 tablets (40 mg total) by mouth daily.   methocarbamol 500 MG tablet Commonly known as: Robaxin Take 1 tablet (500 mg total) by mouth every 6 (six) hours as needed for up to 5 days for muscle spasms (abdominal pain).   potassium chloride SA 20 MEQ tablet Commonly known as: KLOR-CON TAKE 1 TABLET (20 MEQ TOTAL) BY MOUTH DAILY. What changed: how much to take   spironolactone 25 MG tablet Commonly known as: ALDACTONE TAKE 1 TABLET (25 MG TOTAL) BY MOUTH DAILY. What changed: how much to take   valACYclovir 1000 MG tablet Commonly known as: VALTREX Take 1 tablet (1,000 mg total) by mouth daily.        Follow-up [facility], [place]. Go on 08/18/2021.   Specialty: General Surgery Why: Your appointment is 12/1 at 9:30am for staple removal and drain check Please arrive 30 minutes prior to your appointment to check in and fill out paperwork. Bring photo ID and insurance information. Contact information: [address] [place] [phone]        Armandina Gemma, MD. Daphane Shepherd on 08/30/2021.   Specialty: General Surgery Why: Your appointment is 12/13 at 4:15pm Please arrive 15 minutes early to check in. Contact information: [address] [place] [address] [phone]                Allergies  Allergen Reactions   Clomiphene Hives    Hives and lips swelling.    Consultations: General surgery, cardiology  Procedures/Studies: DG Abd 1 View  Result Date: 08/01/2021 CLINICAL DATA:  Nasogastric tube  placement EXAM: ABDOMEN - 1 VIEW COMPARISON:  None. FINDINGS: Nasogastric tube tip overlies the proximal to mid body of the stomach. Indeterminate bowel-gas pattern due to a paucity of intra-abdominal gas. No gross free intraperitoneal gas. IMPRESSION: Nasogastric tube tip within the proximal to mid body of the stomach. Electronically Signed   By: Fidela Salisbury M.D.   On: 08/01/2021 03:31   CT ABDOMEN PELVIS W CONTRAST  Result Date: 07/31/2021 CLINICAL DATA:  Bowel obstruction suspected.  Abdominal pain EXAM: CT ABDOMEN AND PELVIS WITH CONTRAST TECHNIQUE: Multidetector CT imaging of the abdomen and pelvis was performed using the standard protocol following bolus administration of intravenous contrast. CONTRAST:  129mL OMNIPAQUE IOHEXOL 300 MG/ML  SOLN COMPARISON:  None. FINDINGS: Lower chest: No acute abnormality. Hepatobiliary: Low-density adjacent to the falciform ligament, likely focal fatty infiltration. Gallbladder unremarkable. Pancreas: No focal abnormality or ductal dilatation. Spleen: No focal abnormality.  Normal size. Adrenals/Urinary Tract: No adrenal abnormality. No focal renal abnormality. No stones or hydronephrosis. Urinary bladder is unremarkable. Stomach/Bowel: Infraumbilical ventral hernia containing small bowel loops. Prominent fluid-filled small bowel loops in the lower abdomen and pelvis concerning for low grade small bowel obstruction. Stomach is moderately distended with fluid. Large bowel grossly unremarkable. Vascular/Lymphatic: No evidence of aneurysm or adenopathy.  Reproductive: Uterus and adnexa unremarkable. No mass. IUD within the uterus. Other: No free fluid or free air. Musculoskeletal: No acute bony abnormality. IMPRESSION: Infraumbilical ventral hernia containing multiple small bowel loops. Small bowel loops in the lower abdomen and pelvis are mildly dilated and fluid-filled. Findings concerning for early/low grade small bowel obstruction. Electronically Signed   By: Charlett Nose M.D.   On: 07/31/2021 22:32   DG Abd Portable 1V  Result Date: 08/02/2021 CLINICAL DATA:  Small-bowel obstruction. EXAM: PORTABLE ABDOMEN - 1 VIEW COMPARISON:  Abdominal radiography done yesterday. FINDINGS: Orogastric or nasogastric tube is been removed. No dilated bowel is seen. I cannot appreciate the presence of any gastrointestinal contrast. IUD seen in the central pelvis. Ordinary lumbar degenerative changes are noted. IMPRESSION: One view abdominal image within normal limits. No evidence of bowel obstruction. Previously administered oral contrast is not visible. Electronically Signed   By: Paulina Fusi M.D.   On: 08/02/2021 10:33   DG Abd Portable 1V-Small Bowel Obstruction Protocol-initial, 8 hr delay  Result Date: 08/01/2021 CLINICAL DATA:  Abdominal pain, follow up small bowel. EXAM: PORTABLE ABDOMEN - 1 VIEW COMPARISON:  Film from earlier in the same day. FINDINGS: Nasogastric catheter remains in place. Some of the previously administered contrast lies in the gastric fundus. Contrast within the small bowel is difficult to evaluate due to patient's body habitus. The degree of small-bowel dilatation seen on prior CT is not appreciated possibly related to fluid-filled bowel loops. No free air is noted. IMPRESSION: Administered contrast is not well evaluated distally. No obstructive pattern is noted although previously dilated loops of small bowel were fluid-filled and likely difficult to visualized on plain film. Electronically Signed   By: Alcide Clever M.D.   On: 08/01/2021 21:27   ECHOCARDIOGRAM LIMITED  Result Date: 08/03/2021    ECHOCARDIOGRAM LIMITED REPORT   Patient Name:   NOLIE BIGNELL Date of Exam: 08/03/2021 Medical Rec #:  [id number]         Height:       68.0 in Accession #:    [id number]        Weight:       286.2 lb Date of Birth:  02/18/1981         BSA:          2.380 m Patient Age:    40 years          BP:           144/85 mmHg Patient Gender: F                 HR:            89 bpm. Exam Location:  Inpatient Procedure: Limited Echo and Intracardiac Opacification Agent Indications:    CHF  History:        Patient has prior history of Echocardiogram examinations, most                 recent 11/12/2020. Risk Factors:Diabetes.  Sonographer:    Alvera Novel Referring Phys: [id number] Kathlynn Grate [place]  Sonographer Comments: Patient is morbidly obese. Image acquisition challenging due to patient body habitus. IMPRESSIONS  1. Left ventricular ejection fraction, by estimation, is 35 to 40%. The left ventricle has moderately decreased function. The left ventricle demonstrates global hypokinesis.  2. Right ventricular systolic function is normal. The right ventricular size is normal. Tricuspid regurgitation signal is inadequate for assessing PA pressure.  3. The inferior vena cava is normal in size  with greater than 50% respiratory variability, suggesting right atrial pressure of 3 mmHg.  4. Limited echo with no doppler. FINDINGS  Left Ventricle: Left ventricular ejection fraction, by estimation, is 35 to 40%. The left ventricle has moderately decreased function. The left ventricle demonstrates global hypokinesis. Definity contrast agent was given IV to delineate the left ventricular endocardial borders. The left ventricular internal cavity size was normal in size. There is no left ventricular hypertrophy. Right Ventricle: The right ventricular size is normal. No increase in right ventricular wall thickness. Right ventricular systolic function is normal. Tricuspid regurgitation signal is inadequate for assessing PA pressure. Right Atrium: Right atrial size was normal in size. Pericardium: There is no evidence of pericardial effusion. Venous: The inferior vena cava is normal in size with greater than 50% respiratory variability, suggesting right atrial pressure of 3 mmHg. LEFT VENTRICLE PLAX 2D LVIDd:         5.00 cm LVIDs:         4.30 cm LV PW:         1.00 cm LV IVS:        1.00 cm  LV  Volumes (MOD) LV vol d, MOD A2C: 167.0 ml LV vol d, MOD A4C: 160.0 ml LV vol s, MOD A2C: 90.9 ml LV vol s, MOD A4C: 104.0 ml LV SV MOD A2C:     76.1 ml LV SV MOD A4C:     160.0 ml LV SV MOD BP:      70.1 ml Dalton McleanMD Electronically signed by Franki Monte Signature Date/Time: 08/03/2021/4:39:01 PM    Final      Subjective: No acute issues or events overnight tolerating p.o. well multiple episodes of bowel movements and flatus without any ongoing abdominal pain, JP drain ongoing without purulence erythema or bleeding   Discharge Exam: Vitals:   08/07/21 0418 08/07/21 0701  BP: (!) 152/104 131/80  Pulse: (!) 102 76  Resp: 18   Temp: 99.1 F (37.3 C)   SpO2: 100%    Vitals:   08/06/21 1405 08/06/21 2245 08/07/21 0418 08/07/21 0701  BP: (!) 149/89 135/79 (!) 152/104 131/80  Pulse: 91 96 (!) 102 76  Resp: 18 18 18    Temp: 99.4 F (37.4 C) 98.6 F (37 C) 99.1 F (37.3 C)   TempSrc:   Oral   SpO2: 100% 100% 100%   Weight:      Height:        General: Pt is alert, awake, not in acute distress Cardiovascular: RRR, S1/S2 +, no rubs, no gallops Respiratory: CTA bilaterally, no wheezing, no rhonchi Abdominal: Soft, NT, ND, bowel sounds +; JP drain intact Extremities: no edema, no cyanosis    The results of significant diagnostics from this hospitalization (including imaging, microbiology, ancillary and laboratory) are listed below for reference.     Microbiology: Recent Results (from the past 240 hour(s))  Resp Panel by RT-PCR (Flu A&B, Covid) Nasopharyngeal Swab     Status: None   Collection Time: 07/31/21 10:50 PM   Specimen: Nasopharyngeal Swab; Nasopharyngeal(NP) swabs in vial transport medium  Result Value Ref Range Status   SARS Coronavirus 2 by RT PCR NEGATIVE NEGATIVE Final    Comment: (NOTE) SARS-CoV-2 target nucleic acids are NOT DETECTED.  The SARS-CoV-2 RNA is generally detectable in upper respiratory specimens during the acute phase of infection. The  lowest concentration of SARS-CoV-2 viral copies this assay can detect is 138 copies/mL. A negative result does not preclude SARS-Cov-2 infection and should not be  used as the sole basis for treatment or other patient management decisions. A negative result may occur with  improper specimen collection/handling, submission of specimen other than nasopharyngeal swab, presence of viral mutation(s) within the areas targeted by this assay, and inadequate number of viral copies(<138 copies/mL). A negative result must be combined with clinical observations, patient history, and epidemiological information. The expected result is Negative.  Fact Sheet for Patients:  EntrepreneurPulse.com.au  Fact Sheet for Healthcare Providers:  IncredibleEmployment.be  This test is no t yet approved or cleared by the [place] FDA and  has been authorized for detection and/or diagnosis of SARS-CoV-2 by FDA under an Emergency Use Authorization (EUA). This EUA will remain  in effect (meaning this test can be used) for the duration of the COVID-19 declaration under Section 564(b)(1) of the Act, 21 U.S.C.section 360bbb-3(b)(1), unless the authorization is terminated  or revoked sooner.       Influenza A by PCR NEGATIVE NEGATIVE Final   Influenza B by PCR NEGATIVE NEGATIVE Final    Comment: (NOTE) The Xpert Xpress SARS-CoV-2/FLU/RSV plus assay is intended as an aid in the diagnosis of influenza from Nasopharyngeal swab specimens and should not be used as a sole basis for treatment. Nasal washings and aspirates are unacceptable for Xpert Xpress SARS-CoV-2/FLU/RSV testing.  Fact Sheet for Patients: EntrepreneurPulse.com.au  Fact Sheet for Healthcare Providers: IncredibleEmployment.be  This test is not yet approved or cleared by the [place] FDA and has been authorized for detection and/or diagnosis of SARS-CoV-2 by FDA under  an Emergency Use Authorization (EUA). This EUA will remain in effect (meaning this test can be used) for the duration of the COVID-19 declaration under Section 564(b)(1) of the Act, 21 U.S.C. section 360bbb-3(b)(1), unless the authorization is terminated or revoked.  Performed at [facility], [address] [place], [place] [address]   Surgical pcr screen     Status: None   Collection Time: 08/03/21  9:37 AM   Specimen: Nasal Mucosa; Nasal Swab  Result Value Ref Range Status   MRSA, PCR NEGATIVE NEGATIVE Final   Staphylococcus aureus NEGATIVE NEGATIVE Final    Comment: (NOTE) The Xpert SA Assay (FDA approved for NASAL specimens in patients 14 years of age and older), is one component of a comprehensive surveillance program. It is not intended to diagnose infection nor to guide or monitor treatment. Performed at [facility], [place] [address] [place], [place] [address]      Labs: BNP (last 3 results) Recent Labs    10/05/20 1134 11/12/20 1029 03/08/21 0938  BNP 61.4 58.7 [id number]   Basic Metabolic Panel: Recent Labs  Lab 08/01/21 0319 08/03/21 0325 08/04/21 0312 08/05/21 0754 08/06/21 0645  NA 139 139 140 140 138  K 3.4* 3.6 3.6 3.5 3.1*  CL 101 101 102 104 100  CO2 27 26 29 31  32  GLUCOSE 125* 117* 114* 140* 123*  BUN 7 10 10 9 8   CREATININE 0.57 0.82 0.94 0.91 0.79  CALCIUM 8.9 9.0 9.0 8.4* 8.3*  MG  --  2.4  --  2.1  --    Liver Function Tests: Recent Labs  Lab 07/31/21 1911  AST 9*  ALT 8  ALKPHOS 137*  BILITOT 0.6  PROT 7.5  ALBUMIN 3.9   Recent Labs  Lab 07/31/21 1911  LIPASE 12   No results for input(s): AMMONIA in the last 168 hours. CBC: Recent Labs  Lab 07/31/21 1911 08/03/21 0325 08/04/21 0312 08/05/21 0754 08/06/21 0645  WBC 16.1* 18.9* 17.8* 13.9* 14.2*  HGB 12.3 13.7 12.8 11.9* 11.6*  HCT 39.8 46.2* 42.3 38.8 37.6  MCV 73.4* 75.2* 74.9* 74.5* 74.6*  PLT 304 325 301 289 278   Cardiac  Enzymes: No results for input(s): CKTOTAL, CKMB, CKMBINDEX, TROPONINI in the last 168 hours. BNP: Invalid input(s): POCBNP CBG: Recent Labs  Lab 08/04/21 0000 08/04/21 0350 08/04/21 0724 08/04/21 0923 08/04/21 1623  GLUCAP 112* 126* 105* 102* 139*   D-Dimer No results for input(s): DDIMER in the last 72 hours. Hgb A1c No results for input(s): HGBA1C in the last 72 hours. Lipid Profile No results for input(s): CHOL, HDL, LDLCALC, TRIG, CHOLHDL, LDLDIRECT in the last 72 hours. Thyroid function studies No results for input(s): TSH, T4TOTAL, T3FREE, THYROIDAB in the last 72 hours.  Invalid input(s): FREET3 Anemia work up No results for input(s): VITAMINB12, FOLATE, FERRITIN, TIBC, IRON, RETICCTPCT in the last 72 hours. Urinalysis    Component Value Date/Time   COLORURINE YELLOW 07/31/2021 1910   APPEARANCEUR CLEAR 07/31/2021 1910   LABSPEC 1.022 07/31/2021 1910   PHURINE 7.0 07/31/2021 1910   GLUCOSEU NEGATIVE 07/31/2021 1910   HGBUR NEGATIVE 07/31/2021 1910   HGBUR negative 11/10/2010 1038   BILIRUBINUR NEGATIVE 07/31/2021 1910   BILIRUBINUR neg 03/03/2016 1043   KETONESUR NEGATIVE 07/31/2021 1910   PROTEINUR NEGATIVE 07/31/2021 1910   UROBILINOGEN 0.2 03/03/2016 1043   UROBILINOGEN negative 11/10/2010 1038   NITRITE NEGATIVE 07/31/2021 1910   LEUKOCYTESUR NEGATIVE 07/31/2021 1910   Sepsis Labs Invalid input(s): PROCALCITONIN,  WBC,  LACTICIDVEN Microbiology Recent Results (from the past 240 hour(s))  Resp Panel by RT-PCR (Flu A&B, Covid) Nasopharyngeal Swab     Status: None   Collection Time: 07/31/21 10:50 PM   Specimen: Nasopharyngeal Swab; Nasopharyngeal(NP) swabs in vial transport medium  Result Value Ref Range Status   SARS Coronavirus 2 by RT PCR NEGATIVE NEGATIVE Final    Comment: (NOTE) SARS-CoV-2 target nucleic acids are NOT DETECTED.  The SARS-CoV-2 RNA is generally detectable in upper respiratory specimens during the acute phase of infection. The  lowest concentration of SARS-CoV-2 viral copies this assay can detect is 138 copies/mL. A negative result does not preclude SARS-Cov-2 infection and should not be used as the sole basis for treatment or other patient management decisions. A negative result may occur with  improper specimen collection/handling, submission of specimen other than nasopharyngeal swab, presence of viral mutation(s) within the areas targeted by this assay, and inadequate number of viral copies(<138 copies/mL). A negative result must be combined with clinical observations, patient history, and epidemiological information. The expected result is Negative.  Fact Sheet for Patients:  EntrepreneurPulse.com.au  Fact Sheet for Healthcare Providers:  IncredibleEmployment.be  This test is no t yet approved or cleared by the [place] FDA and  has been authorized for detection and/or diagnosis of SARS-CoV-2 by FDA under an Emergency Use Authorization (EUA). This EUA will remain  in effect (meaning this test can be used) for the duration of the COVID-19 declaration under Section 564(b)(1) of the Act, 21 U.S.C.section 360bbb-3(b)(1), unless the authorization is terminated  or revoked sooner.       Influenza A by PCR NEGATIVE NEGATIVE Final   Influenza B by PCR NEGATIVE NEGATIVE Final    Comment: (NOTE) The Xpert Xpress SARS-CoV-2/FLU/RSV plus assay is intended as an aid in the diagnosis of influenza from Nasopharyngeal swab specimens and should not be used as a sole basis for treatment. Nasal washings and aspirates are unacceptable for Xpert  Xpress SARS-CoV-2/FLU/RSV testing.  Fact Sheet for Patients: EntrepreneurPulse.com.au  Fact Sheet for Healthcare Providers: IncredibleEmployment.be  This test is not yet approved or cleared by the [place] FDA and has been authorized for detection and/or diagnosis of SARS-CoV-2 by FDA under  an Emergency Use Authorization (EUA). This EUA will remain in effect (meaning this test can be used) for the duration of the COVID-19 declaration under Section 564(b)(1) of the Act, 21 U.S.C. section 360bbb-3(b)(1), unless the authorization is terminated or revoked.  Performed at [facility], [address], [place], [place] [address]   Surgical pcr screen     Status: None   Collection Time: 08/03/21  9:37 AM   Specimen: Nasal Mucosa; Nasal Swab  Result Value Ref Range Status   MRSA, PCR NEGATIVE NEGATIVE Final   Staphylococcus aureus NEGATIVE NEGATIVE Final    Comment: (NOTE) The Xpert SA Assay (FDA approved for NASAL specimens in patients 65 years of age and older), is one component of a comprehensive surveillance program. It is not intended to diagnose infection nor to guide or monitor treatment. Performed at [facility], [place] [address], [place], [place] [address]      Time coordinating discharge: Over 30 minutes  SIGNED:   Little Ishikawa, DO [facility] 08/07/2021, 12:10 PM Pager   If 7PM-7AM, please contact night-coverage [email or web address]

## 2021-08-07 NOTE — Progress Notes (Signed)
3 Days Post-Op   Subjective/Chief Complaint: Feeling much better after NGT out. Tolerating soft diet without nausea or emesis. Pain is controlled. Having loose bowel movements  Objective: Vital signs in last 24 hours: Temp:  [98.6 F (37 C)-99.4 F (37.4 C)] 99.1 F (37.3 C) (11/20 0418) Pulse Rate:  [76-102] 76 (11/20 0701) Resp:  [18] 18 (11/20 0418) BP: (131-152)/(79-104) 131/80 (11/20 0701) SpO2:  [100 %] 100 % (11/20 0418) Last BM Date: 08/06/21  Intake/Output from previous day: 11/19 0701 - 11/20 0700 In: 3400.8 [P.O.:1440; I.V.:1543.2; IV Piggyback:417.6] Out: 40 [Drains:40] Intake/Output this shift: No intake/output data recorded.  PE:  Constitutional: No acute distress, conversant, appears stated age. Eyes: Anicteric sclerae, moist conjunctiva, no lid lag Lungs: Clear to auscultation bilaterally, normal respiratory effort CV: regular rate and rhythm, no murmurs, no peripheral edema GI: Soft, no masses or hepatosplenomegaly, non-tender to palpation, incisions c/d/I - staples visible through honeycomb dressing intact Skin: No rashes, palpation reveals normal turgor Psychiatric: appropriate judgment and insight, oriented to person, place, and time   Lab Results:  Recent Labs    08/05/21 0754 08/06/21 0645  WBC 13.9* 14.2*  HGB 11.9* 11.6*  HCT 38.8 37.6  PLT 289 278    BMET Recent Labs    08/05/21 0754 08/06/21 0645  NA 140 138  K 3.5 3.1*  CL 104 100  CO2 31 32  GLUCOSE 140* 123*  BUN 9 8  CREATININE 0.91 0.79  CALCIUM 8.4* 8.3*    Anti-infectives: Anti-infectives (From admission, onward)    Start     Dose/Rate Route Frequency Ordered Stop   08/04/21 0930  ertapenem (INVANZ) 1,000 mg in sodium chloride 0.9 % 100 mL IVPB        1 g 200 mL/hr over 30 Minutes Intravenous On call to O.R. 08/04/21 0730 08/04/21 1143       Assessment/Plan: Incarcerated incisional hernia with small bowel obstruction -POD#3 s/p diagnostic laparoscopy, open  primary repair of incarcerated incisional hernia with lysis of adhesions (20 minutes) 11/17 Dr. Gerrit Friends - tolerated fulls and then soft diet. - continue drain (in subcutaneous tissue) at discharge and plan to remove in office in about one week   Stable for discharge with drain from surgical perspective. I will send pain medications.   ID - none FEN - IVF, soft VTE - SCDs, lovenox Foley - d/c 11/18   Chronic Biventricular Heart Failure - followed by Dr. Gala Romney, EF 30% on ECHO 11/12/20 HTN DM OSA Obesity BMI 43.51   LOS: 6 days    [name] 08/07/2021

## 2021-08-15 ENCOUNTER — Other Ambulatory Visit (HOSPITAL_COMMUNITY): Payer: Self-pay

## 2021-08-18 ENCOUNTER — Telehealth: Payer: Self-pay | Admitting: *Deleted

## 2021-08-18 NOTE — Telephone Encounter (Signed)
Transition Care Management Follow-up Telephone Call Date of discharge and from where: [facility]   07/20/2021 How have you been since you were released from the hospital? "Good and back to work" Any questions or concerns? No  Items Reviewed: Did the pt receive and understand the discharge instructions provided? Yes  Medications obtained and verified? Yes  Other? No  Any new allergies since your discharge? No  Dietary orders reviewed? Yes Do you have support at home? Yes   Home Care and Equipment/Supplies: Were home health services ordered? no If so, what is the name of the agency? N/a  Has the agency set up a time to come to the patient's home? not applicable Were any new equipment or medical supplies ordered?  No What is the name of the medical supply agency? N/a Were you able to get the supplies/equipment? not applicable Do you have any questions related to the use of the equipment or supplies? N/a  Functional Questionnaire: (I = Independent and D = Dependent) ADLs: I  Bathing/Dressing- I  Meal Prep- I  Eating- I  Maintaining continence- I  Transferring/Ambulation- I  Managing Meds- I  Follow up appointments reviewed:  PCP Hospital f/u appt confirmed? No  Saw surgeon 08/18/2021 Specialist Hospital f/u appt confirmed? Yes  Scheduled to see surgeon on 08/18/2021 Are transportation arrangements needed? No  If their condition worsens, is the pt aware to call PCP or go to the Emergency Dept.? Yes Was the patient provided with contact information for the PCP's office or ED? Yes Was to pt encouraged to call back with questions or concerns? Yes   [facility], BSN RN Case Manager [phone]

## 2021-08-26 DIAGNOSIS — L910 Hypertrophic scar: Secondary | ICD-10-CM | POA: Diagnosis not present

## 2021-09-01 ENCOUNTER — Encounter: Payer: Self-pay | Admitting: Primary Care

## 2021-09-01 ENCOUNTER — Ambulatory Visit (INDEPENDENT_AMBULATORY_CARE_PROVIDER_SITE_OTHER): Payer: BC Managed Care – PPO | Admitting: Primary Care

## 2021-09-01 ENCOUNTER — Other Ambulatory Visit: Payer: Self-pay

## 2021-09-01 VITALS — BP 120/78 | HR 77 | Temp 98.3°F | Ht 68.0 in | Wt 285.0 lb

## 2021-09-01 DIAGNOSIS — Z23 Encounter for immunization: Secondary | ICD-10-CM

## 2021-09-01 DIAGNOSIS — G4733 Obstructive sleep apnea (adult) (pediatric): Secondary | ICD-10-CM | POA: Diagnosis not present

## 2021-09-01 NOTE — Progress Notes (Signed)
@Patient  ID: , female    DOB: [birth year], 40 y.o.   MRN: [id number]  Chief Complaint  Patient presents with   Follow-up    Patient is here to get her CPAP back and her heart doc wants her back on the machine.    Referring provider: [id number], Zola Button, *  HPI: 40 -year-old female, never smoked.  Past medical history significant for obstructive sleep apnea, childhood asthma, hypertension, chronic diastolic heart failure, cardiomegaly, vitamin D deficiency. Patient of Dr. 41, seen for initial consult on 07/07/2020 for shortness of breath nocturnal wheezing.  Patient was given prescription for Pulmicort 180 MCG Flexhaler to use twice daily with albuterol inhaler and ordered for home sleep study for possible underlying obstructive sleep apnea.  Advised to follow-up in 6 weeks.  Previous [facility] pulmonary encounter: 09/28/2020 Patient presents today for for an overdue follow-up.  Sleep study completed on 08/08/2020 which showed severe obstructive sleep apnea with mild oxygen desaturations.  Recommended CPAP therapy auto titrate 5 to 20 cm H2O. patient received CPAP machine beginning of December 2021.  Patient as admitted from 08/24/2020 - 09/01/2020 with hypertension and lower extremity edema and found to have acute systolic hearty failure. Echo completed and showed biventricular dysfunction with EF 15-20%. She was started on Lasix and HF meds. She had cesarean section 08/28/20 without complications, had incisional VAC. She established with cardiology on 09/09/20 outpatient following hospitalization. Continues hydralazine/nitrates, Entresto 24/26 BID, Digoxin 0.17mcg daily, Spirolactone 12.5mg  daily.   She is doing ok. Has no breathing complaints. Denies wheezing or shortness of breath. Stopped using Pulmicort and has not required SABA. Her baby is in the NICU. She just started wearing CPAP, pressure feeling a little high. She has not problems sleeping at night. No significant  improvement in energy level.   Airview download 08/28/20-09/27/19 3/30 days (10%); 3 days (10%) > 4 hours Average usage days used 6 hours 14 mins Pressure 5-20cm h20 (8.3cm h20-95%) Airleak 0.8L/min (95%) AHI 0.7  11/09/2020  Patient contacted for 6 week follow-up. Patient has a newborn at home. During last visit encouraged patient improve his compliance with CPAP machine. We changed CPAP pressure setting to 5-15cm h20 but per download it remains set at 5-20cmh20. She is doing well, she has been compliant with CPAP use. No issues with mask fit or pressure setting. At times pressure can feel too strong. She is getting on average 6-7 hours of sleep a night, this can be interrupted d/t having a newborn at home. She has not noticed a significant improvement in her energy level yet but feels she will be able to judge this better when she returns to work in the next couple of weeks. She is off Pulmicort inhaler and has not required albuterol hfa prn shortness of breath.   Airview download 10/09/20-11/07/20 27/30 days (90%); 26 (87%) > 4 hours Average usage 6 hours 38 mins Pressure 5-20 cm h20 (10.2cm h20-95%) Airleaks 4.3L/min (95%) AHI 1.3    09/01/2021- Interim hx  Patient presents today for annual follow-up/OSA. During our last visit in February 2022 patient was noted to be 87% compliant with CPAP use >4 hours. Advised she follow-up in August 2022 for 6 month visit. Since we last saw each other, she tells me that she had to return CPAP machine d/t non-compliance. She did stop using CPAP machine d/t having a new born at home who was sleeping with her. Her machine was returned in October 2022. She is going to bed around  8-9pm. She wakes up on average 3 times a night. She gets out of bed at 7am. She has consistent daytime sleepiness. She is unsure if she noticed benefit from CPAP use. Cardiology advised that she resume CPAP use, she is open to re-starting but will likely need repeat HST d/t break in  therapy. DME company is [employer].   Sleep testing: -08/08/20 HST>> severe OSA, AHI 36.4/hr with SpO2 low 81%.   Allergies  Allergen Reactions   Clomiphene Hives    Hives and lips swelling.    Immunization History  Administered Date(s) Administered   Hpv-Unspecified 05/16/2006, 08/06/2006, 03/11/2007   Influenza Whole 07/02/2008, 07/13/2009   Influenza,inj,Quad PF,6+ Mos 07/07/2020   Moderna SARS-COV2 Booster Vaccination 07/16/2020   Moderna Sars-Covid-2 Vaccination 11/10/2019, 12/09/2019   Td 02/17/1999, 11/10/2010   Tdap 08/11/2020    Past Medical History:  Diagnosis Date   Anemia    Asthma    Genital herpes    Gestational diabetes    Obesity    Pre-diabetes    Thyroid disease     Tobacco History: Social History   Tobacco Use  Smoking Status Never   Passive exposure: Never  Smokeless Tobacco Never   Counseling given: Not Answered   Outpatient Medications Prior to Visit  Medication Sig Dispense Refill   albuterol (PROAIR HFA) 108 (90 Base) MCG/ACT inhaler Inhale 2 puffs into the lungs every 6 (six) hours as needed. (Patient taking differently: Inhale 2 puffs into the lungs every 6 (six) hours as needed for wheezing or shortness of breath.) 6.7 g 1   calcium carbonate (TUMS - DOSED IN MG ELEMENTAL CALCIUM) 500 MG chewable tablet Chew 1-2 tablets by mouth daily as needed for indigestion or heartburn.     carvedilol (COREG) 3.125 MG tablet TAKE 1 TABLET (3.125 MG TOTAL) BY MOUTH TWO TIMES DAILY WITH A MEAL. (Patient taking differently: Take 3.125 mg by mouth 2 (two) times daily with a meal.) 60 tablet 5   dapagliflozin propanediol (FARXIGA) 10 MG TABS tablet TAKE 1 TABLET (10 MG TOTAL) BY MOUTH DAILY BEFORE BREAKFAST. (Patient taking differently: Take 10 mg by mouth daily before breakfast.) 30 tablet 11   diphenhydramine-acetaminophen (TYLENOL PM) 25-500 MG TABS tablet Take 1 tablet by mouth at bedtime as needed (sleep).     FLUoxetine (PROZAC) 20 MG capsule Take 1  capsule (20 mg total) by mouth daily. 90 capsule 1   furosemide (LASIX) 20 MG tablet Take 2 tablets (40 mg total) by mouth daily. 60 tablet 5   potassium chloride SA (KLOR-CON) 20 MEQ tablet TAKE 1 TABLET (20 MEQ TOTAL) BY MOUTH DAILY. (Patient taking differently: Take 20 mEq by mouth daily.) 90 tablet 3   sacubitril-valsartan (ENTRESTO) 97-103 MG Take 1 tablet by mouth 2 (two) times daily. 60 tablet 11   spironolactone (ALDACTONE) 25 MG tablet TAKE 1 TABLET (25 MG TOTAL) BY MOUTH DAILY. (Patient taking differently: Take 25 mg by mouth daily.) 30 tablet 11   valACYclovir (VALTREX) 1000 MG tablet Take 1 tablet (1,000 mg total) by mouth daily. 90 tablet 3   No facility-administered medications prior to visit.    Review of Systems  Review of Systems  Constitutional:  Positive for fatigue.  Respiratory: Negative.    Psychiatric/Behavioral:  Positive for sleep disturbance.     Physical Exam  BP 120/78 (BP Location: Left Arm, Patient Position: Sitting, Cuff Size: Normal)    Pulse 77    Temp 98.3 F (36.8 C) (Oral)    Ht 5\' 8"  (  1.727 m)    Wt 285 lb (129.3 kg)    LMP  (LMP Unknown)    SpO2 100%    BMI 43.33 kg/m  Physical Exam Constitutional:      General: She is not in acute distress.    Appearance: Normal appearance. She is obese. She is not ill-appearing.  HENT:     Head: Normocephalic and atraumatic.     Mouth/Throat:     Mouth: Mucous membranes are moist.     Pharynx: Oropharynx is clear.  Cardiovascular:     Rate and Rhythm: Normal rate and regular rhythm.  Pulmonary:     Effort: Pulmonary effort is normal.     Breath sounds: Normal breath sounds. No wheezing, rhonchi or rales.  Musculoskeletal:        General: Normal range of motion.  Skin:    General: Skin is warm and dry.  Neurological:     General: No focal deficit present.     Mental Status: She is alert and oriented to person, place, and time. Mental status is at baseline.  Psychiatric:        Mood and Affect: Mood  normal.        Behavior: Behavior normal.        Thought Content: Thought content normal.        Judgment: Judgment normal.     Lab Results:  CBC    Component Value Date/Time   WBC 14.2 (H) 08/06/2021 0645   RBC 5.04 08/06/2021 0645   HGB 11.6 (L) 08/06/2021 0645   HGB 11.8 07/23/2009 1131   HCT 37.6 08/06/2021 0645   HCT 34.7 (L) 07/23/2009 1131   PLT 278 08/06/2021 0645   PLT 321 07/23/2009 1131   MCV 74.6 (L) 08/06/2021 0645   MCV 68 (L) 07/23/2009 1131   MCH 23.0 (L) 08/06/2021 0645   MCHC 30.9 08/06/2021 0645   RDW 15.9 (H) 08/06/2021 0645   RDW 15.5 (H) 07/23/2009 1131   LYMPHSABS 1.3 08/28/2020 1520   LYMPHSABS 3.0 07/23/2009 1131   MONOABS 1.8 (H) 08/28/2020 1520   EOSABS 0.0 08/28/2020 1520   EOSABS 0.3 07/23/2009 1131   BASOSABS 0.0 08/28/2020 1520   BASOSABS 0.1 07/23/2009 1131    BMET    Component Value Date/Time   NA 138 08/06/2021 0645   K 3.1 (L) 08/06/2021 0645   CL 100 08/06/2021 0645   CO2 32 08/06/2021 0645   GLUCOSE 123 (H) 08/06/2021 0645   BUN 8 08/06/2021 0645   CREATININE 0.79 08/06/2021 0645   CREATININE 0.80 09/02/2019 0914   CALCIUM 8.3 (L) 08/06/2021 0645   GFRNONAA >60 08/06/2021 0645   GFRAA >60 09/19/2018 1315    BNP    Component Value Date/Time   BNP 16.8 03/08/2021 0938    ProBNP No results found for: PROBNP  Imaging: ECHOCARDIOGRAM LIMITED  Result Date: 08/03/2021    ECHOCARDIOGRAM LIMITED REPORT   Patient Name:   [name] Date of Exam: 08/03/2021 Medical Rec #:  [id number]         Height:       68.0 in Accession #:    [id number]        Weight:       286.2 lb Date of Birth:  1981-08-01         BSA:          2.380 m Patient Age:    40 years          BP:  144/85 mmHg Patient Gender: F                 HR:           89 bpm. Exam Location:  Inpatient Procedure: Limited Echo and Intracardiac Opacification Agent Indications:    CHF  History:        Patient has prior history of Echocardiogram examinations, most                  recent 11/12/2020. Risk Factors:Diabetes.  Sonographer:    Alvera Novel Referring Phys: [id number] Kathlynn Grate [place]  Sonographer Comments: Patient is morbidly obese. Image acquisition challenging due to patient body habitus. IMPRESSIONS  1. Left ventricular ejection fraction, by estimation, is 35 to 40%. The left ventricle has moderately decreased function. The left ventricle demonstrates global hypokinesis.  2. Right ventricular systolic function is normal. The right ventricular size is normal. Tricuspid regurgitation signal is inadequate for assessing PA pressure.  3. The inferior vena cava is normal in size with greater than 50% respiratory variability, suggesting right atrial pressure of 3 mmHg.  4. Limited echo with no doppler. FINDINGS  Left Ventricle: Left ventricular ejection fraction, by estimation, is 35 to 40%. The left ventricle has moderately decreased function. The left ventricle demonstrates global hypokinesis. Definity contrast agent was given IV to delineate the left ventricular endocardial borders. The left ventricular internal cavity size was normal in size. There is no left ventricular hypertrophy. Right Ventricle: The right ventricular size is normal. No increase in right ventricular wall thickness. Right ventricular systolic function is normal. Tricuspid regurgitation signal is inadequate for assessing PA pressure. Right Atrium: Right atrial size was normal in size. Pericardium: There is no evidence of pericardial effusion. Venous: The inferior vena cava is normal in size with greater than 50% respiratory variability, suggesting right atrial pressure of 3 mmHg. LEFT VENTRICLE PLAX 2D LVIDd:         5.00 cm LVIDs:         4.30 cm LV PW:         1.00 cm LV IVS:        1.00 cm  LV Volumes (MOD) LV vol d, MOD A2C: 167.0 ml LV vol d, MOD A4C: 160.0 ml LV vol s, MOD A2C: 90.9 ml LV vol s, MOD A4C: 104.0 ml LV SV MOD A2C:     76.1 ml LV SV MOD A4C:     160.0 ml LV SV MOD BP:      70.1  ml Dalton McleanMD Electronically signed by Wilfred Lacy Signature Date/Time: 08/03/2021/4:39:01 PM    Final      Assessment & Plan:   OSA (obstructive sleep apnea) - She had home sleep test on 08/08/20 that showed severe OSA, AHI 36.4/hr with SpO2 low 81%. CPAP was returned d/t noncompliance in October 2022. She is willing to restart CPAP therapy but will need repeat sleep study prior to resuming. She would also be open to inspire device but at this time does not meet BMI requirements. We have placed an order for in-lab split night sleep study. Once results are available we can place order to start CPAP. Continue to encourage weight loss efforts. FU in 6 months or sooner if needed.    Glenford Bayley, NP 09/01/2021

## 2021-09-01 NOTE — Assessment & Plan Note (Signed)
-   She had home sleep test on 08/08/20 that showed severe OSA, AHI 36.4/hr with SpO2 low 81%. CPAP was returned d/t noncompliance in October 2022. She is willing to restart CPAP therapy but will need repeat sleep study prior to resuming. She would also be open to inspire device but at this time does not meet BMI requirements. We have placed an order for in-lab split night sleep study. Once results are available we can place order to start CPAP. Continue to encourage weight loss efforts. FU in 6 months or sooner if needed.

## 2021-09-01 NOTE — Patient Instructions (Addendum)
I spoke with [employer] medication equipment company and we will need to repeat sleep study d/t break in therapy. There is a several week wait for home sleep study testing and it is unlikely it will be done before Jan 1st. I will order in-lab sleep study as this may be able to be done quicker. We will call you once we hav results and place order for new CPAP machine after study is complete, please call me if you have not heard in 1-2 weeks after completing.  Follow-up: 6 months with Beth NP    Sleep Apnea Sleep apnea affects breathing during sleep. It causes breathing to stop for 10 seconds or more, or to become shallow. People with sleep apnea usually snore loudly. It can also increase the risk of: Heart attack. Stroke. Being very overweight (obese). Diabetes. Heart failure. Irregular heartbeat. High blood pressure. The goal of treatment is to help you breathe normally again. What are the causes? The most common cause of this condition is a collapsed or blocked airway. There are three kinds of sleep apnea: Obstructive sleep apnea. This is caused by a blocked or collapsed airway. Central sleep apnea. This happens when the brain does not send the right signals to the muscles that control breathing. Mixed sleep apnea. This is a combination of obstructive and central sleep apnea. What increases the risk? Being overweight. Smoking. Having a small airway. Being older. Being female. Drinking alcohol. Taking medicines to calm yourself (sedatives or tranquilizers). Having family members with the condition. Having a tongue or tonsils that are larger than normal. What are the signs or symptoms? Trouble staying asleep. Loud snoring. Headaches in the morning. Waking up gasping. Dry mouth or sore throat in the morning. Being sleepy or tired during the day. If you are sleepy or tired during the day, you may also: Not be able to focus your mind (concentrate). Forget things. Get angry a lot and  have mood swings. Feel sad (depressed). Have changes in your personality. Have less interest in sex, if you are female. Be unable to have an erection, if you are female. How is this treated?  Sleeping on your side. Using a medicine to get rid of mucus in your nose (decongestant). Avoiding the use of alcohol, medicines to help you relax, or certain pain medicines (narcotics). Losing weight, if needed. Changing your diet. Quitting smoking. Using a machine to open your airway while you sleep, such as: An oral appliance. This is a mouthpiece that shifts your lower jaw forward. A CPAP device. This device blows air through a mask when you breathe out (exhale). An EPAP device. This has valves that you put in each nostril. A BIPAP device. This device blows air through a mask when you breathe in (inhale) and breathe out. Having surgery if other treatments do not work. Follow these instructions at home: Lifestyle Make changes that your doctor recommends. Eat a healthy diet. Lose weight if needed. Avoid alcohol, medicines to help you relax, and some pain medicines. Do not smoke or use any products that contain nicotine or tobacco. If you need help quitting, ask your doctor. General instructions Take over-the-counter and prescription medicines only as told by your doctor. If you were given a machine to use while you sleep, use it only as told by your doctor. If you are having surgery, make sure to tell your doctor you have sleep apnea. You may need to bring your device with you. Keep all follow-up visits. Contact a doctor if: The  machine that you were given to use during sleep bothers you or does not seem to be working. You do not get better. You get worse. Get help right away if: Your chest hurts. You have trouble breathing in enough air. You have an uncomfortable feeling in your back, arms, or stomach. You have trouble talking. One side of your body feels weak. A part of your face is  hanging down. These symptoms may be an emergency. Get help right away. Call your local emergency services (911 in the [place] Do not wait to see if the symptoms will go away. Do not drive yourself to the hospital. Summary This condition affects breathing during sleep. The most common cause is a collapsed or blocked airway. The goal of treatment is to help you breathe normally while you sleep. This information is not intended to replace advice given to you by your health care provider. Make sure you discuss any questions you have with your health care provider. Document Revised: 04/13/2021 Document Reviewed: 08/13/2020 Elsevier Patient Education  2022 [employer].

## 2021-09-14 ENCOUNTER — Encounter (HOSPITAL_COMMUNITY): Payer: Self-pay | Admitting: Internal Medicine

## 2021-09-14 ENCOUNTER — Other Ambulatory Visit (HOSPITAL_COMMUNITY): Payer: Self-pay | Admitting: Family Medicine

## 2021-09-14 ENCOUNTER — Other Ambulatory Visit (HOSPITAL_COMMUNITY): Payer: Self-pay | Admitting: Adult Health

## 2021-09-14 ENCOUNTER — Other Ambulatory Visit: Payer: Self-pay | Admitting: Family Medicine

## 2021-09-14 ENCOUNTER — Other Ambulatory Visit (HOSPITAL_COMMUNITY): Payer: Self-pay | Admitting: Internal Medicine

## 2021-09-14 ENCOUNTER — Other Ambulatory Visit (HOSPITAL_COMMUNITY): Payer: Self-pay

## 2021-09-14 MED ORDER — VALACYCLOVIR HCL 1 G PO TABS
1.0000 g | ORAL_TABLET | Freq: Every day | ORAL | 0 refills | Status: DC
  Filled 2021-09-14: qty 60, 60d supply, fill #0

## 2021-09-14 MED ORDER — POTASSIUM CHLORIDE CRYS ER 20 MEQ PO TBCR
20.0000 meq | EXTENDED_RELEASE_TABLET | Freq: Every day | ORAL | 3 refills | Status: DC
  Filled 2021-09-14 – 2022-06-02 (×2): qty 90, 90d supply, fill #0

## 2021-09-14 MED ORDER — ENTRESTO 97-103 MG PO TABS
1.0000 | ORAL_TABLET | Freq: Two times a day (BID) | ORAL | 3 refills | Status: DC
  Filled 2021-09-14 – 2022-06-02 (×2): qty 180, 90d supply, fill #0

## 2021-09-14 MED ORDER — VALACYCLOVIR HCL 1 G PO TABS
1000.0000 mg | ORAL_TABLET | Freq: Every day | ORAL | 0 refills | Status: DC
  Filled 2021-09-14: qty 30, 30d supply, fill #0

## 2021-09-14 MED ORDER — CARVEDILOL 3.125 MG PO TABS
3.1250 mg | ORAL_TABLET | Freq: Two times a day (BID) | ORAL | 5 refills | Status: DC
  Filled 2021-09-14: qty 60, 30d supply, fill #0
  Filled 2021-12-14: qty 60, 30d supply, fill #1
  Filled 2022-06-02: qty 60, 30d supply, fill #0
  Filled 2022-07-11: qty 60, 30d supply, fill #1

## 2021-09-14 MED FILL — Spironolactone Tab 25 MG: ORAL | 30 days supply | Qty: 30 | Fill #3 | Status: AC

## 2021-09-14 MED FILL — Dapagliflozin Propanediol Tab 10 MG (Base Equivalent): ORAL | 30 days supply | Qty: 30 | Fill #4 | Status: AC

## 2021-09-22 ENCOUNTER — Other Ambulatory Visit (HOSPITAL_COMMUNITY): Payer: Self-pay

## 2021-09-27 ENCOUNTER — Other Ambulatory Visit (HOSPITAL_COMMUNITY): Payer: Self-pay

## 2021-09-28 NOTE — Progress Notes (Addendum)
Advanced Heart Failure Clinic Note   PCP: Donato Schultz, DO PCP-Cardiologist: Chilton Si, MD  HF Cardiologist: Dr. Gala Romney, MD  Reason for Visit: Hospital f/u  HPI:  [name] is a 41 year old female with a history of obesity, DM, HTN, asthma, OSA and severe systolic HF.  Echo 2010  EF 55-60%   Admitted 12/21 at [redacted] weeks pregnant, with first baby, with hypertension and lower extremity edema and found to have acute systolic heart failure. Echo completed and showed biventricular dysfunction with EF 15-20%. CO-OX consistent with cardiogenic shock.  Diuresed over 50 pounds and GDMT initiated   Echo 02/22: EF still low but mildly improved, up to 30% w/ mild-mod RV dysfunction. Digoxin was discontinued and Farxiga 10 mg daily was added.   On 11/25/20, pt contacted clinic w/ reports of increased dyspnea and 3 lb wt gain. She was advised to increase lasix to 40 mg daily x 2 days.   She returned f/u on 11/30/20. NYHA II-early IIIb symptoms.  She was tolerating [place] ok w/o SE. No GU symptoms. Cleda Daub and Sherryll Burger increased.   Follow-up on 12/30/20. She ran out of her meds 1 week prior. Not wearing CPAP. Wt was up 10 lbs from previous visit. Asking about diet pills. She was COVID+ 01/09/21.   At visit 03/08/21, lasix increased to 40mg  daily for weight gain, ivabradine increased to 7.5 BID, yeast infection treated with fluconazole.  Not wearing cpap.    Last seen in clinic July 2022. Volume looked good. Ivabradine decreased. Wanted to remain on Farxiga despite candidiasis. Bedside echo with improvement in EF to 45%. Iron studies consistent with iron deficiency. IV iron infusion recommended.   Admitted 07/2021 with SBO. Cardiology consulted for pre-op evaluation. Had not been taking medications for several weeks PTA. Echo with EF 35-40%, RV okay. Underwent laparoscopic and open repair of incarcerated incisional hernia with lysis of adhesions. Did well post-op. GDMT restarted including  entresto, carvedilol, farxiga, lasix and spironolactone. Isordil/hydralazine and corlanor held at discharge.  She is here today for hospital follow-up. Patient reports she has been taking all of the above medications including those held at hospital discharge. Ran out of corlanor two days. Ago. Trying to be better with taking medications every day. Has some dyspnea with exertion but has not progressed over last few months. Can climb 2 flights of stairs without difficulty.  No orthopnea, PND or LE edema. Denies dizziness, presyncope or syncope.   BP elevated today but has not taken her medications yet.   Needs to complete another sleep study in order to get CPAP again.    Echo 08/24/20: Biventricular HF EF < 20%  Echo 2/22: EF 30% mild to mod RV dysfunction  Echo 11/22: EF 35-40%, RV okay    FH: Dad has A FIb, Aunt heart failure.  SH: Works full time 12/22. Does not drink alcohol or smoke.  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 (Patient taking differently: Inhale 2 puffs into the lungs every 6 (six) hours as needed for wheezing or shortness of breath.) 6.7 g 1  calcium carbonate (TUMS - DOSED IN MG ELEMENTAL CALCIUM) 500 MG chewable tablet Chew 1-2 tablets by mouth daily as needed for indigestion or heartburn.     carvedilol (COREG) 3.125 MG tablet Take 1 tablet (3.125 mg total) by mouth 2 (two) times daily with a meal. 60 tablet 5   dapagliflozin propanediol (FARXIGA) 10 MG TABS tablet TAKE 1 TABLET (10 MG TOTAL) BY MOUTH DAILY BEFORE BREAKFAST. (Patient taking differently: Take 10 mg by mouth daily before breakfast.) 30 tablet 11    diphenhydramine-acetaminophen (TYLENOL PM) 25-500 MG TABS tablet Take 1 tablet by mouth at bedtime as needed (sleep).     FLUoxetine (PROZAC) 20 MG capsule Take 1 capsule (20 mg total) by mouth daily. 90 capsule 1   furosemide (LASIX) 20 MG tablet Take 2 tablets (40 mg total) by mouth daily. 60 tablet 5   potassium chloride SA (KLOR-CON M) 20 MEQ tablet Take 1 tablet (20 mEq total) by mouth daily. 90 tablet 3   sacubitril-valsartan (ENTRESTO) 97-103 MG Take 1 tablet by mouth 2 (two) times daily. 180 tablet 3   spironolactone (ALDACTONE) 25 MG tablet TAKE 1 TABLET (25 MG TOTAL) BY MOUTH DAILY. 30 tablet 11   valACYclovir (VALTREX) 1000 MG tablet Take 1 tablet (1,000 mg total) by mouth daily. 30 tablet 0   hydrALAZINE (APRESOLINE) 25 MG tablet Take 1 tablet (25 mg total) by mouth 3 (three) times daily. 90 tablet 3   isosorbide dinitrate (ISORDIL) 10 MG tablet Take 1 tablet (10 mg total) by mouth 3 (three) times daily. 90 tablet 3   ivabradine (CORLANOR) 5 MG TABS tablet Take 1 tablet (5 mg total) by mouth 2 (two) times daily with a meal. 60 tablet 3   No current facility-administered medications for this encounter.   Allergies  Allergen Reactions   Clomiphene Hives    Hives and lips swelling.    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    Employer: [employer]  Tobacco Use   Smoking status: Never    Passive exposure: Never   Smokeless tobacco: Never  Vaping Use   Vaping Use: Never used  Substance and Sexual Activity   Alcohol use: Yes    Comment: SOCIAL    Drug use: No   Sexual activity: Yes    Partners: Male    Birth control/protection: None    Comment: 1st intercourse- 16, partners- 10, current partner- 4 yrs   Other Topics Concern   Not on file  Social History  Narrative   Not on file   Social Determinants of Health   Financial Resource Strain: Not on file  Food Insecurity: Not on file  Transportation Needs: Not on file  Physical Activity: Not on file  Stress: Not on file  Social Connections: Not on file  Intimate Partner Violence: Not on file     Family History  Problem Relation Age of Onset   Hyperlipidemia Mother    Hypertension Mother    Depression Father    Hyperlipidemia Father    Heart disease Father 45       MI   Hypertension Father    Cancer Father 69       prostate   Cancer Paternal Grandfather  prostate   Coronary artery disease Other    Prostate cancer Other    Cancer Maternal Grandfather        prostate    BP (!) 152/100    Pulse 89    Wt 127.5 kg (281 lb)    LMP  (LMP Unknown)    SpO2 99%    BMI 42.73 kg/m   Wt Readings from Last 3 Encounters:  09/29/21 127.5 kg (281 lb)  09/01/21 129.3 kg (285 lb)  08/04/21 129.8 kg (286 lb 2.5 oz)   PHYSICAL EXAM: General:  NAD. No resp difficulty HEENT: Normal Neck: Supple. Thick neck, difficult to assess JVD. Carotids 2+ bilat; no bruits. No lymphadenopathy or thryomegaly appreciated. Cor: PMI nondisplaced. Regular rate & rhythm. No rubs, gallops or murmurs. Lungs: Clear Abdomen: Obese, soft, nontender, nondistended. No hepatosplenomegaly. No bruits or masses. Good bowel sounds. Extremities: No cyanosis, clubbing, rash, edema Neuro: alert & oriented x 3, cranial nerves grossly intact. Moves all 4 extremities w/o difficulty. Affect pleasant.  ECG: Sinus rhythm 89 bpm    ASSESSMENT & PLAN 1. Chronic Biventricular Heart Failure - Echo 12/21 severely reduced EF 15-20% . Possible HTN CM. - C-section 12/21 without complications.  - Echo 11/12/20: EF 30% mild to mod RV dysfunction  - Echo 11/22: EF 35-40%, RV okay (had been off medications several weeks) - NYHA II. Volume appears stable. - Continue lasix 40 mg daily. - Continue ivabradine 5 mg BID. HR upper 80s  today but has been out of ivabradine a few days. Refilled. - Continue carvedilol 3.125 mg bid.  - Continue Entresto 97/103 mg bid.  - Continue spiro 25 mg daily. - Continue Farxiga 10 mg daily. Denies recent UTI or yeast infection - Continue hydralazine 25 mg TID + isordil 10 mg TID - Discussed reliable birth control & avoidance of future pregnancy. She has an IUD in place. - Stressed importance of adherence to medication regimen. - Encouraged exercise regimen - Check BMET today  2. Sinus tachycardia - Resolved. HR upper 80s today. - Ivabradine refilled   3. HTN - BP elevated today.  - Has not taken any of her medications today.  - Monitor BP at home. She will call if consistently elevated, may need to consider increasing hydralazine   4. DM2 - a1c 6.5 (11/22). - Per PCP. - On Farxiga.    5. OSA - Planning to repeat sleep study so she can restart CPAP  6. Obesity - Body mass index is 42.73 kg/m. - On Saxenda in past, but unable to afford. May be a good candidate for Wegovy/Ozempic, advise follow up with PCP to discuss.  7. Iron deficiency anemia - Last hemoglobin 11.6. MCV has been low.  - CBC and iron stores today   F/u 4 months with echo to reassess LV function and EF on medications  Jadesola Poynter N, PA-C 09/29/21

## 2021-09-29 ENCOUNTER — Encounter (HOSPITAL_COMMUNITY): Payer: Self-pay

## 2021-09-29 ENCOUNTER — Other Ambulatory Visit (HOSPITAL_COMMUNITY): Payer: Self-pay

## 2021-09-29 ENCOUNTER — Telehealth (HOSPITAL_COMMUNITY): Payer: Self-pay | Admitting: Pharmacist

## 2021-09-29 ENCOUNTER — Ambulatory Visit (HOSPITAL_COMMUNITY)
Admission: RE | Admit: 2021-09-29 | Discharge: 2021-09-29 | Disposition: A | Discharge status: Home / Self Care | Payer: BC Managed Care – PPO | Source: Ambulatory Visit | Attending: Physician Assistant | Admitting: Physician Assistant

## 2021-09-29 ENCOUNTER — Encounter (HOSPITAL_COMMUNITY): Payer: Self-pay | Admitting: Cardiology

## 2021-09-29 ENCOUNTER — Other Ambulatory Visit: Payer: Self-pay

## 2021-09-29 VITALS — BP 152/100 | HR 89 | Wt 281.0 lb

## 2021-09-29 DIAGNOSIS — Z79899 Other long term (current) drug therapy: Secondary | ICD-10-CM | POA: Insufficient documentation

## 2021-09-29 DIAGNOSIS — Z8616 Personal history of COVID-19: Secondary | ICD-10-CM | POA: Diagnosis not present

## 2021-09-29 DIAGNOSIS — Z6841 Body Mass Index (BMI) 40.0 and over, adult: Secondary | ICD-10-CM | POA: Diagnosis not present

## 2021-09-29 DIAGNOSIS — I5022 Chronic systolic (congestive) heart failure: Secondary | ICD-10-CM | POA: Diagnosis not present

## 2021-09-29 DIAGNOSIS — Z975 Presence of (intrauterine) contraceptive device: Secondary | ICD-10-CM | POA: Insufficient documentation

## 2021-09-29 DIAGNOSIS — E119 Type 2 diabetes mellitus without complications: Secondary | ICD-10-CM | POA: Insufficient documentation

## 2021-09-29 DIAGNOSIS — I5082 Biventricular heart failure: Secondary | ICD-10-CM | POA: Insufficient documentation

## 2021-09-29 DIAGNOSIS — Z9989 Dependence on other enabling machines and devices: Secondary | ICD-10-CM | POA: Diagnosis not present

## 2021-09-29 DIAGNOSIS — D509 Iron deficiency anemia, unspecified: Secondary | ICD-10-CM | POA: Insufficient documentation

## 2021-09-29 DIAGNOSIS — Z7984 Long term (current) use of oral hypoglycemic drugs: Secondary | ICD-10-CM | POA: Insufficient documentation

## 2021-09-29 DIAGNOSIS — E669 Obesity, unspecified: Secondary | ICD-10-CM | POA: Insufficient documentation

## 2021-09-29 DIAGNOSIS — G4733 Obstructive sleep apnea (adult) (pediatric): Secondary | ICD-10-CM | POA: Insufficient documentation

## 2021-09-29 DIAGNOSIS — R000 Tachycardia, unspecified: Secondary | ICD-10-CM | POA: Insufficient documentation

## 2021-09-29 DIAGNOSIS — I10 Essential (primary) hypertension: Secondary | ICD-10-CM

## 2021-09-29 DIAGNOSIS — I110 Hypertensive heart disease with heart failure: Secondary | ICD-10-CM | POA: Insufficient documentation

## 2021-09-29 DIAGNOSIS — J45909 Unspecified asthma, uncomplicated: Secondary | ICD-10-CM | POA: Diagnosis not present

## 2021-09-29 LAB — CBC
HCT: 40.1 % (ref 36.0–46.0)
Hemoglobin: 12.7 g/dL (ref 12.0–15.0)
MCH: 23.1 pg — ABNORMAL LOW (ref 26.0–34.0)
MCHC: 31.7 g/dL (ref 30.0–36.0)
MCV: 73 fL — ABNORMAL LOW (ref 80.0–100.0)
Platelets: 295 10*3/uL (ref 150–400)
RBC: 5.49 MIL/uL — ABNORMAL HIGH (ref 3.87–5.11)
RDW: 16.4 % — ABNORMAL HIGH (ref 11.5–15.5)
WBC: 13.6 10*3/uL — ABNORMAL HIGH (ref 4.0–10.5)
nRBC: 0 % (ref 0.0–0.2)

## 2021-09-29 LAB — BASIC METABOLIC PANEL
Anion gap: 11 (ref 5–15)
BUN: 5 mg/dL — ABNORMAL LOW (ref 6–20)
CO2: 25 mmol/L (ref 22–32)
Calcium: 9.1 mg/dL (ref 8.9–10.3)
Chloride: 103 mmol/L (ref 98–111)
Creatinine, Ser: 0.73 mg/dL (ref 0.44–1.00)
GFR, Estimated: >60 mL/min (ref >60)
Glucose, Bld: 135 mg/dL — ABNORMAL HIGH (ref 70–99)
Potassium: 3.3 mmol/L — ABNORMAL LOW (ref 3.5–5.1)
Sodium: 139 mmol/L (ref 135–145)

## 2021-09-29 LAB — IRON AND TIBC
Iron: 81 ug/dL (ref 28–170)
Saturation Ratios: 21 % (ref 10.4–31.8)
TIBC: 391 ug/dL (ref 250–450)
UIBC: 310 ug/dL

## 2021-09-29 LAB — FERRITIN: Ferritin: 40 ng/mL (ref 11–307)

## 2021-09-29 MED ORDER — IVABRADINE HCL 5 MG PO TABS
5.0000 mg | ORAL_TABLET | Freq: Two times a day (BID) | ORAL | 3 refills | Status: DC
  Filled 2021-09-29 – 2021-09-30 (×2): qty 60, 30d supply, fill #0
  Filled 2021-12-14: qty 60, 30d supply, fill #1

## 2021-09-29 MED ORDER — ISOSORBIDE DINITRATE 10 MG PO TABS
10.0000 mg | ORAL_TABLET | Freq: Three times a day (TID) | ORAL | 3 refills | Status: DC
  Filled 2021-09-29 – 2022-06-02 (×2): qty 90, 30d supply, fill #0
  Filled 2022-07-11: qty 90, 30d supply, fill #1

## 2021-09-29 MED ORDER — HYDRALAZINE HCL 25 MG PO TABS
25.0000 mg | ORAL_TABLET | Freq: Three times a day (TID) | ORAL | 3 refills | Status: DC
  Filled 2021-09-29 – 2022-06-02 (×2): qty 90, 30d supply, fill #0
  Filled 2022-07-11: qty 90, 30d supply, fill #1

## 2021-09-29 NOTE — Telephone Encounter (Signed)
Advanced Heart Failure Patient Advocate Encounter  Prior Authorization for Corlanor has been approved.    PA# [id number] Effective dates: 09/29/21 through 09/29/22  Audry Riles, PharmD, BCPS, BCCP, CPP Heart Failure Clinic Pharmacist [phone]

## 2021-09-29 NOTE — Patient Instructions (Signed)
It was great to see you today! No medication changes are needed at this time.  Labs today We will only contact you if something comes back abnormal or we need to make some changes. Otherwise no news is good news!  Your physician recommends that you schedule a follow-up appointment in: 4 months with Dr Gala Romney and echo  Your physician has requested that you have an echocardiogram. Echocardiography is a painless test that uses sound waves to create images of your heart. It provides your doctor with information about the size and shape of your heart and how well your hearts chambers and valves are working. This procedure takes approximately one hour. There are no restrictions for this procedure.  Your physician has requested that you regularly monitor and record your blood pressure readings at home. Please use the same machine at the same time of day to check your readings and record them to bring to your follow-up visit.   If you have any questions or concerns before your next appointment please send [place] a message through [place] or call our office at [phone].    TO LEAVE A MESSAGE FOR THE NURSE SELECT OPTION 2, PLEASE LEAVE A MESSAGE INCLUDING: YOUR NAME DATE OF BIRTH CALL BACK NUMBER REASON FOR CALL**this is important as we prioritize the call backs  YOU WILL RECEIVE A CALL BACK THE SAME DAY AS LONG AS YOU CALL BEFORE 4:00 PM   Do the following things EVERYDAY: Weigh yourself in the morning before breakfast. Write it down and keep it in a log. Take your medicines as prescribed Eat low salt foods--Limit salt (sodium) to 2000 mg per day.  Stay as active as you can everyday Limit all fluids for the day to less than 2 liters'

## 2021-09-30 ENCOUNTER — Other Ambulatory Visit (HOSPITAL_COMMUNITY): Payer: Self-pay

## 2021-10-03 ENCOUNTER — Telehealth (HOSPITAL_COMMUNITY): Payer: Self-pay

## 2021-10-03 ENCOUNTER — Other Ambulatory Visit (HOSPITAL_COMMUNITY): Payer: Self-pay

## 2021-10-03 DIAGNOSIS — D509 Iron deficiency anemia, unspecified: Secondary | ICD-10-CM

## 2021-10-03 NOTE — Telephone Encounter (Addendum)
Appointment booked for 10/11/2021 @ 9.00am. Pt aware, agreeable, and verbalized understanding   ----- Message from Andrey Farmer, PA-C sent at 09/29/2021  5:26 PM EST ----- Ferritin less than 100. She is iron deficient. Please arrange for feraheme in infusion clinic. Her K is 3.3. Increase K supplement from 20 mEq to 40 mEq daily. BMET in 1 week.

## 2021-10-11 ENCOUNTER — Other Ambulatory Visit: Payer: Self-pay

## 2021-10-11 ENCOUNTER — Ambulatory Visit (HOSPITAL_COMMUNITY)
Admission: RE | Admit: 2021-10-11 | Discharge: 2021-10-11 | Disposition: A | Discharge status: Home / Self Care | Payer: BC Managed Care – PPO | Source: Ambulatory Visit | Attending: Internal Medicine | Admitting: Internal Medicine

## 2021-10-11 DIAGNOSIS — D509 Iron deficiency anemia, unspecified: Secondary | ICD-10-CM | POA: Diagnosis not present

## 2021-10-11 MED ORDER — SODIUM CHLORIDE 0.9 % IV SOLN
510.0000 mg | Freq: Once | INTRAVENOUS | Status: AC
  Administered 2021-10-11: 10:00:00 510 mg via INTRAVENOUS
  Filled 2021-10-11: qty 510

## 2021-10-12 ENCOUNTER — Ambulatory Visit (HOSPITAL_BASED_OUTPATIENT_CLINIC_OR_DEPARTMENT_OTHER): Payer: BC Managed Care – PPO | Attending: Primary Care | Admitting: Internal Medicine

## 2021-10-12 VITALS — Ht 67.0 in | Wt 277.0 lb

## 2021-10-12 DIAGNOSIS — R0683 Snoring: Secondary | ICD-10-CM

## 2021-10-12 DIAGNOSIS — G4733 Obstructive sleep apnea (adult) (pediatric): Secondary | ICD-10-CM | POA: Insufficient documentation

## 2021-10-16 DIAGNOSIS — G4733 Obstructive sleep apnea (adult) (pediatric): Secondary | ICD-10-CM | POA: Diagnosis not present

## 2021-10-16 NOTE — Procedures (Signed)
° °  Patient Name: [name], [place] Date: 10/12/2021 Gender: Female D.O.B: 05-10-1981 Age (years): 89 Referring Provider: Geraldo Pitter NP Height (inches): 67 Interpreting Physician: Baird Lyons MD, ABSM Weight (lbs): 277 RPSGT: Laren Everts BMI: 61 MRN: [id number] Neck Size: 16.50  CLINICAL INFORMATION Sleep Study Type: NPSG Indication for sleep study: Congestive Heart Failure, Diabetes, Fatigue, Hypertension, Morning Headaches, Obesity, OSA, Snoring Epworth Sleepiness Score: 5  Most recent polysomnogram dated 08/08/2020 revealed an AHI of 36.4/h.  SLEEP STUDY TECHNIQUE As per the AASM Manual for the Scoring of Sleep and Associated Events v2.3 (April 2016) with a hypopnea requiring 4% desaturations.  The channels recorded and monitored were frontal, central and occipital EEG, electrooculogram (EOG), submentalis EMG (chin), nasal and oral airflow, thoracic and abdominal wall motion, anterior tibialis EMG, snore microphone, electrocardiogram, and pulse oximetry.  MEDICATIONS Medications self-administered by patient taken the night of the study : none reported  SLEEP ARCHITECTURE The study was initiated at 10:00:11 PM and ended at 5:32:52 AM.  Sleep onset time was 5.4 minutes and the sleep efficiency was 89.7%%. The total sleep time was 406 minutes.  Stage REM latency was 175.0 minutes.  The patient spent 8.5%% of the night in stage N1 sleep, 67.2%% in stage N2 sleep, 0.0%% in stage N3 and 24.3% in REM.  Alpha intrusion was absent.  Supine sleep was 23.40%.  RESPIRATORY PARAMETERS The overall apnea/hypopnea index (AHI) was 2.2 per hour. There were 0 total apneas, including 0 obstructive, 0 central and 0 mixed apneas. There were 15 hypopneas and 37 RERAs.  The AHI during Stage REM sleep was 6.7 per hour.  AHI while supine was 5.7 per hour.  The mean oxygen saturation was 95.9%. The minimum SpO2 during sleep was 87.0%.  moderate snoring was noted during this  study.  CARDIAC DATA The 2 lead EKG demonstrated sinus rhythm. The mean heart rate was 77.0 beats per minute. Other EKG findings include: PVCs.  LEG MOVEMENT DATA The total PLMS were 0 with a resulting PLMS index of 0.0. Associated arousal with leg movement index was 0.0 .  IMPRESSIONS - No significant obstructive sleep apnea occurred during this study (AHI = 2.2/h). - Mild oxygen desaturation was noted during this study (Min O2 = 87.0%). Mean 95.9%. - The patient snored with moderate snoring volume. - EKG findings include PVCs. - Clinically significant periodic limb movements did not occur during sleep. No significant associated arousals.  DIAGNOSIS - Primary Snoring  RECOMMENDATIONS - Manage for snoring and symptoms based on clinical judgment. - Be careful with alcohol, sedatives and other CNS depressants that may worsen sleep apnea and disrupt normal sleep architecture. - Sleep hygiene should be reviewed to assess factors that may improve sleep quality. - Weight management and regular exercise should be initiated or continued if appropriate.  [Electronically signed] 10/16/2021 12:28 PM  Baird Lyons MD, [place], American Board of Sleep Medicine   NPI: [id number]                          [place] [place] [facility]  ELECTRONICALLY SIGNED ON:  10/16/2021, 12:25 PM [place] PH: [phone]   FX: [phone] [place]

## 2021-10-18 ENCOUNTER — Telehealth: Payer: Self-pay | Admitting: Primary Care

## 2021-10-18 NOTE — Telephone Encounter (Signed)
Called and spoke with pt letting her know the results of split night study and she verbalized understanding. Nothing further needed.

## 2021-10-18 NOTE — Telephone Encounter (Signed)
Please let patient know split night sleep study on 10/12/21 did not meet requirement for dx OSA. She does not need to resume CPAP. Recommend weight loss, side sleeping position and avoiding alcohol/sedating medication prior to bedtime   - No significant obstructive sleep apnea occurred during this study (AHI = 2.2/h). - Mild oxygen desaturation was noted during this study (Min O2 = 87.0%). Mean 95.9% - The patient snored with moderate snoring volume

## 2021-10-20 DIAGNOSIS — L910 Hypertrophic scar: Secondary | ICD-10-CM | POA: Diagnosis not present

## 2021-10-20 DIAGNOSIS — J324 Chronic pansinusitis: Secondary | ICD-10-CM | POA: Diagnosis not present

## 2021-12-04 DIAGNOSIS — H6691 Otitis media, unspecified, right ear: Secondary | ICD-10-CM | POA: Diagnosis not present

## 2021-12-12 ENCOUNTER — Other Ambulatory Visit (HOSPITAL_COMMUNITY): Payer: Self-pay

## 2021-12-12 ENCOUNTER — Telehealth: Payer: Self-pay | Admitting: Family Medicine

## 2021-12-12 ENCOUNTER — Other Ambulatory Visit: Payer: Self-pay | Admitting: Family Medicine

## 2021-12-12 ENCOUNTER — Ambulatory Visit (INDEPENDENT_AMBULATORY_CARE_PROVIDER_SITE_OTHER): Payer: BC Managed Care – PPO | Admitting: Family Medicine

## 2021-12-12 ENCOUNTER — Other Ambulatory Visit (HOSPITAL_BASED_OUTPATIENT_CLINIC_OR_DEPARTMENT_OTHER): Payer: Self-pay

## 2021-12-12 VITALS — BP 140/80 | HR 73 | Temp 98.0°F | Resp 16 | Ht 67.0 in | Wt 288.0 lb

## 2021-12-12 DIAGNOSIS — N760 Acute vaginitis: Secondary | ICD-10-CM

## 2021-12-12 DIAGNOSIS — E1165 Type 2 diabetes mellitus with hyperglycemia: Secondary | ICD-10-CM

## 2021-12-12 DIAGNOSIS — H65194 Other acute nonsuppurative otitis media, recurrent, right ear: Secondary | ICD-10-CM

## 2021-12-12 MED ORDER — AZITHROMYCIN 250 MG PO TABS
ORAL_TABLET | ORAL | 0 refills | Status: AC
  Filled 2021-12-12: qty 6, 5d supply, fill #0

## 2021-12-12 MED ORDER — TIRZEPATIDE 2.5 MG/0.5ML ~~LOC~~ SOAJ
2.5000 mg | SUBCUTANEOUS | 0 refills | Status: DC
  Filled 2021-12-12 (×2): qty 2, 28d supply, fill #0

## 2021-12-12 MED ORDER — FLUCONAZOLE 150 MG PO TABS
150.0000 mg | ORAL_TABLET | Freq: Every day | ORAL | 0 refills | Status: DC
  Filled 2021-12-12: qty 2, 4d supply, fill #0

## 2021-12-12 MED ORDER — OFLOXACIN 0.3 % OT SOLN
5.0000 [drp] | Freq: Every day | OTIC | 0 refills | Status: DC
  Filled 2021-12-12: qty 5, 20d supply, fill #0

## 2021-12-12 NOTE — Progress Notes (Addendum)
? ?Subjective:  ? ?By signing my name below, I, Cassell Clement, attest that this documentation has been prepared under the direction and in the presence of Seabron Spates R DO 12/12/2021 ? ? Patient ID: [name], female    DOB: 05-19-1981, 41 y.o.   MRN: [id number] ? ?Chief Complaint  ?Patient presents with  ? Medication Problem  ?  Here to discuss Medication   ? ? ?HPI ?Patient is in today for an office visit.  ? ?She is requesting a prescription of Mounjaro.  ? ?She is requesting medication for symptoms of a yeast infection. Her symptoms appeared after using medication for a ear infection that appeared two weeks ago.  ? ?As of today's visit, her blood sugar levels are elevated. She has discontinued use of 3 ML of Lantus and 500 MG of Metformin on 12/04/2021. ?Lab Results  ?Component Value Date  ? HGBA1C 6.9 (H) 12/12/2021  ? ?She is continuing to take 10 MG of [place]. ? ?Past Medical History:  ?Diagnosis Date  ? Anemia   ? Asthma   ? Genital herpes   ? Gestational diabetes   ? Obesity   ? Pre-diabetes   ? Thyroid disease   ? ? ?Past Surgical History:  ?Procedure Laterality Date  ? CESAREAN SECTION N/A 08/28/2020  ? Procedure: CESAREAN SECTION;  Surgeon: Olivia Mackie, MD;  Location: [facility];  Service: Obstetrics;  Laterality: N/A;  ? DILATATION & CURETTAGE/HYSTEROSCOPY WITH MYOSURE N/A 09/24/2018  ? Procedure: DILATATION & CURETTAGE/HYSTEROSCOPY WITH MYOSURE POLYPECTOMY;  Surgeon: [name], MD;  Location: [facility] ORS;  Service: Gynecology;  Laterality: N/A;  ? KELOID EXCISION  2008  ? removed @[facility] ----- 10/2011  ? KELOID EXCISION  09/20/2018  ? behind right ear  ? LAPAROTOMY N/A 08/04/2021  ? Procedure: EXPLORATORY LAPAROTOMY;  Surgeon: 08/06/2021, MD;  Location: [facility] ORS;  Service: General;  Laterality: N/A;  ? SALIVARY GLAND SURGERY    ? 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 VENTRAL HERNIA REPAIR N/A 08/04/2021  ? Procedure: DIAGNOSTIC LAPAROSCOPY; OPEN PRIMARY REPAIR INCARCERATED INCISIONAL  HERNIA;  Surgeon: 08/06/2021, MD;  Location: [facility] ORS;  Service: General;  Laterality: N/A;  ? ? ?Family History  ?Problem Relation Age of Onset  ? Hyperlipidemia Mother   ? Hypertension Mother   ? Depression Father   ? Hyperlipidemia Father   ? Heart disease Father 88  ?     MI  ? Hypertension Father   ? Cancer Father 9  ?     prostate  ? Cancer Paternal Grandfather   ?     prostate  ? Coronary artery disease Other   ? Prostate cancer Other   ? Cancer Maternal Grandfather   ?     prostate  ? ? ?Social History  ? ?Socioeconomic History  ? Marital status: Married  ?  Spouse name: Not on file  ? Number of children: Not on file  ? Years of education: Not on file  ? Highest education level: Not on file  ?Occupational History  ? 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  Employer: [employer]  ?Tobacco Use  ? Smoking status: Never  ?  Passive exposure: Never  ? Smokeless tobacco: Never  ?Vaping Use  ? Vaping Use: Never used  ?Substance and Sexual Activity  ? Alcohol use: Yes  ?  Comment: SOCIAL   ? Drug use: No  ? Sexual activity: Yes  ?  Partners: Male  ?  Birth control/protection: None  ?  Comment: 1st intercourse- 16, partners- 10, current partner- 4 yrs   ?Other Topics Concern  ? Not on file  ?Social History Narrative  ? Not on file  ? ?Social Determinants of Health  ? ?Financial Resource Strain: Not on file  ?Food Insecurity: Not on file  ?Transportation Needs: Not on file  ?Physical Activity: Not on file  ?Stress: Not on file  ?Social Connections: Not on file  ?Intimate Partner Violence: Not on file  ? ? ?Outpatient Medications Prior to Visit  ?Medication Sig Dispense Refill  ? albuterol (PROAIR HFA) 108 (90 Base) MCG/ACT inhaler Inhale 2 puffs into the lungs every 6 (six) hours as needed. (Patient taking differently: Inhale 2 puffs into the lungs every 6 (six) hours as needed for wheezing or shortness of breath.) 6.7 g 1  ? carvedilol (COREG) 3.125 MG tablet Take 1 tablet (3.125 mg total) by mouth 2 (two) times daily with a  meal. 60 tablet 5  ? FLUoxetine (PROZAC) 20 MG capsule Take 1 capsule (20 mg total) by mouth daily. 90 capsule 1  ? furosemide (LASIX) 20 MG tablet Take 2 tablets (40 mg total) by mouth daily. 60 tablet 5  ? hydrALAZINE (APRESOLINE) 25 MG tablet Take 1 tablet (25 mg total) by mouth 3 (three) times daily. 90 tablet 3  ? isosorbide dinitrate (ISORDIL) 10 MG tablet Take 1 tablet (10 mg total) by mouth 3 (three) times daily. 90 tablet 3  ? ivabradine (CORLANOR) 5 MG TABS tablet Take 1 tablet (5 mg total) by mouth 2 (two) times daily with a meal. 60 tablet 3  ? potassium chloride SA (KLOR-CON M) 20 MEQ tablet Take 1 tablet (20 mEq total) by mouth daily. 90 tablet 3  ? sacubitril-valsartan (ENTRESTO) 97-103 MG Take 1 tablet by mouth 2 (two) times daily. 180 tablet 3  ? valACYclovir (VALTREX) 1000 MG tablet Take 1 tablet (1,000 mg total) by mouth daily. 30 tablet 0  ? calcium carbonate (TUMS - DOSED IN MG ELEMENTAL CALCIUM) 500 MG chewable tablet Chew 1-2 tablets by mouth daily as needed for indigestion or heartburn.    ? diphenhydramine-acetaminophen (TYLENOL PM) 25-500 MG TABS tablet Take 1 tablet by mouth at bedtime as needed (sleep).    ? spironolactone (ALDACTONE) 25 MG tablet TAKE 1 TABLET (25 MG TOTAL) BY MOUTH DAILY. 30 tablet 11  ? ?No facility-administered medications prior to visit.  ? ? ?Allergies  ?Allergen Reactions  ? Clomiphene Hives  ?  Hives and lips swelling.  ? ? ?Review of Systems  ?Constitutional:  Negative for chills, fever and malaise/fatigue.  ?HENT:  Negative for congestion and hearing loss.   ?Eyes:  Negative for discharge.  ?Respiratory:  Negative for cough, sputum production and shortness of breath.   ?Cardiovascular:  Negative for chest pain, palpitations and leg swelling.  ?Gastrointestinal:  Negative for abdominal pain, blood in stool, constipation, diarrhea, heartburn, nausea and vomiting.  ?Genitourinary:  Negative for dysuria, frequency, hematuria and urgency.  ?     (+) Yeast Infection   ?Musculoskeletal:  Negative for back pain, falls and myalgias.  ?Skin:  Negative for rash.  ?Neurological:  Negative for dizziness, sensory change, loss of consciousness, weakness and headaches.  ?Endo/Heme/Allergies:  Negative for environmental allergies. Does not bruise/bleed easily.  ?Psychiatric/Behavioral:  Negative for depression and suicidal ideas. The patient is not nervous/anxious and does not have insomnia.   ? ?   ?Objective:  ?  ?Physical Exam ?Vitals and nursing note reviewed.  ?Constitutional:   ?  General: She is not in acute distress. ?   Appearance: Normal appearance. She is not ill-appearing.  ?HENT:  ?   Head: Normocephalic and atraumatic.  ?   Right Ear: Tympanic membrane, ear canal and external ear normal. No middle ear effusion.  ?   Left Ear: Tympanic membrane, ear canal and external ear normal.  ?   Ears:  ?   Comments: (+) Redness of right ear canal and right eardrum.  ?Eyes:  ?   Extraocular Movements: Extraocular movements intact.  ?   Pupils: Pupils are equal, round, and reactive to light.  ?Cardiovascular:  ?   Rate and Rhythm: Normal rate and regular rhythm.  ?   Heart sounds: Normal heart sounds. No murmur heard. ?  No gallop.  ?Pulmonary:  ?   Effort: Pulmonary effort is normal. No respiratory distress.  ?   Breath sounds: Normal breath sounds. No wheezing or rales.  ?Skin: ?   General: Skin is warm and dry.  ?Neurological:  ?   Mental Status: She is alert and oriented to person, place, and time.  ?Psychiatric:     ?   Judgment: Judgment normal.  ? ? ?BP 140/80 (BP Location: Right Arm, Patient Position: Sitting, Cuff Size: Normal)   Pulse 73   Temp 98 ?F (36.7 ?C) (Oral)   Resp 16   Ht 5\' 7"  (1.702 m)   Wt 288 lb (130.6 kg)   SpO2 99%   BMI 45.11 kg/m?  ?Wt Readings from Last 3 Encounters:  ?12/12/21 288 lb (130.6 kg)  ?10/12/21 277 lb (125.6 kg)  ?10/11/21 277 lb (125.6 kg)  ? ? ?Diabetic Foot Exam - Simple   ?No data filed ?  ? ?Lab Results  ?Component Value Date  ? WBC  13.6 (H) 09/29/2021  ? HGB 12.7 09/29/2021  ? HCT 40.1 09/29/2021  ? PLT 295 09/29/2021  ? GLUCOSE 128 (H) 12/12/2021  ? CHOL 168 12/12/2021  ? TRIG 91.0 12/12/2021  ? HDL 42.50 12/12/2021  ? LDLCALC 107 (H) 03/

## 2021-12-12 NOTE — Telephone Encounter (Addendum)
Pt would like to transfer from dr lowne chase to dr Jerilee Hoh. Pt did not give a reason for transfer ?

## 2021-12-12 NOTE — Patient Instructions (Signed)

## 2021-12-13 ENCOUNTER — Other Ambulatory Visit (HOSPITAL_COMMUNITY): Payer: Self-pay

## 2021-12-13 ENCOUNTER — Encounter: Payer: Self-pay | Admitting: Family Medicine

## 2021-12-13 ENCOUNTER — Telehealth: Payer: Self-pay

## 2021-12-13 LAB — COMPREHENSIVE METABOLIC PANEL
ALT: 10 U/L (ref 0–35)
AST: 11 U/L (ref 0–37)
Albumin: 4 g/dL (ref 3.5–5.2)
Alkaline Phosphatase: 157 U/L — ABNORMAL HIGH (ref 39–117)
BUN: 7 mg/dL (ref 6–23)
CO2: 31 mEq/L (ref 19–32)
Calcium: 9.5 mg/dL (ref 8.4–10.5)
Chloride: 101 mEq/L (ref 96–112)
Creatinine, Ser: 0.92 mg/dL (ref 0.40–1.20)
GFR: 77.82 mL/min (ref >60.00)
Glucose, Bld: 128 mg/dL — ABNORMAL HIGH (ref 70–99)
Potassium: 3.9 mEq/L (ref 3.5–5.1)
Sodium: 138 mEq/L (ref 135–145)
Total Bilirubin: 0.4 mg/dL (ref 0.2–1.2)
Total Protein: 8.1 g/dL (ref 6.0–8.3)

## 2021-12-13 LAB — LIPID PANEL
Cholesterol: 168 mg/dL (ref 0–200)
HDL: 42.5 mg/dL (ref >39.00)
LDL Cholesterol: 107 mg/dL — ABNORMAL HIGH (ref 0–99)
NonHDL: 125.42
Total CHOL/HDL Ratio: 4
Triglycerides: 91 mg/dL (ref 0.0–149.0)
VLDL: 18.2 mg/dL (ref 0.0–40.0)

## 2021-12-13 LAB — HEMOGLOBIN A1C: Hgb A1c MFr Bld: 6.9 % — ABNORMAL HIGH (ref 4.6–6.5)

## 2021-12-13 NOTE — Telephone Encounter (Signed)
Mounjaro not covered- preferred alternatives; Ozempic, Rybelsus, Trulicity, Victoza. ?

## 2021-12-14 ENCOUNTER — Other Ambulatory Visit (HOSPITAL_COMMUNITY): Payer: Self-pay

## 2021-12-14 ENCOUNTER — Other Ambulatory Visit (HOSPITAL_COMMUNITY): Payer: Self-pay | Admitting: Cardiology

## 2021-12-14 ENCOUNTER — Other Ambulatory Visit (HOSPITAL_COMMUNITY): Payer: Self-pay | Admitting: Internal Medicine

## 2021-12-14 ENCOUNTER — Telehealth: Payer: Self-pay

## 2021-12-14 MED ORDER — SPIRONOLACTONE 25 MG PO TABS
25.0000 mg | ORAL_TABLET | Freq: Every day | ORAL | 11 refills | Status: DC
  Filled 2021-12-14 – 2022-06-02 (×3): qty 30, 30d supply, fill #0
  Filled 2022-07-11: qty 30, 30d supply, fill #1
  Filled 2022-12-13: qty 30, 30d supply, fill #2

## 2021-12-14 MED ORDER — DAPAGLIFLOZIN PROPANEDIOL 10 MG PO TABS
10.0000 mg | ORAL_TABLET | Freq: Every day | ORAL | 11 refills | Status: DC
  Filled 2021-12-14 – 2022-06-02 (×3): qty 30, 30d supply, fill #0
  Filled 2022-07-11: qty 30, 30d supply, fill #1
  Filled 2022-12-13: qty 30, 30d supply, fill #2

## 2021-12-14 MED ORDER — OZEMPIC (0.25 OR 0.5 MG/DOSE) 2 MG/1.5ML ~~LOC~~ SOPN
PEN_INJECTOR | SUBCUTANEOUS | 1 refills | Status: DC
  Filled 2021-12-14: qty 1.5, 28d supply, fill #0
  Filled 2021-12-14: qty 1.5, 42d supply, fill #0
  Filled 2022-01-10: qty 1.5, 28d supply, fill #1

## 2021-12-14 NOTE — Telephone Encounter (Signed)
Patient notified of change and rx sent in. ?

## 2021-12-14 NOTE — Telephone Encounter (Signed)
PA initiated via Covermymeds; KEY:  [id number]. PA approved.  ? ?PA Case: [id number], Status: Approved, Coverage Starts on: 12/14/2021 12:00:00 AM, Coverage Ends on: 12/14/2022 12:00:00 AM. ?

## 2021-12-16 ENCOUNTER — Encounter: Payer: Self-pay | Admitting: Family Medicine

## 2021-12-22 ENCOUNTER — Other Ambulatory Visit (HOSPITAL_COMMUNITY): Payer: Self-pay

## 2021-12-22 DIAGNOSIS — L910 Hypertrophic scar: Secondary | ICD-10-CM | POA: Diagnosis not present

## 2021-12-27 IMAGING — CT CT ABD-PELV W/ CM
2 of 5 series · 17 of 46 positions shown, 19 images · IV contrast (APPLIED)
Comparison: None.

CLINICAL DATA: Bowel obstruction suspected.  Abdominal pain

EXAM:
CT ABDOMEN AND PELVIS WITH CONTRAST
TECHNIQUE: Multidetector CT imaging of the abdomen and pelvis was performed
using the standard protocol following bolus administration of
intravenous contrast.
CONTRAST:  100mL OMNIPAQUE IOHEXOL 300 MG/ML  SOLN

[Series 2: abd pel w · axial · 0.91mm/px · z∈[+688,+1088]mm · 14 of 92 slices shown, 16 images]
[im 6/92  soft-tissue]
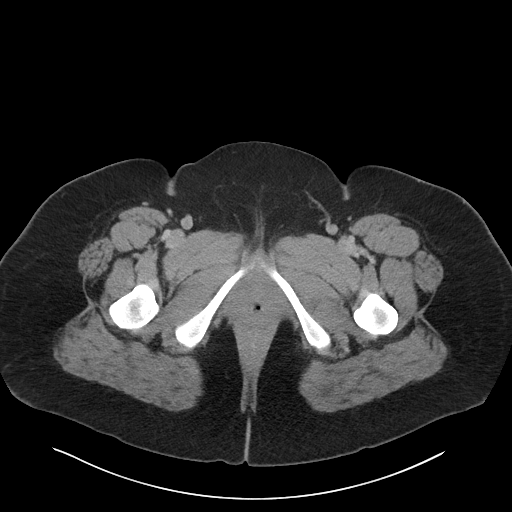
[im 6/92  bone]
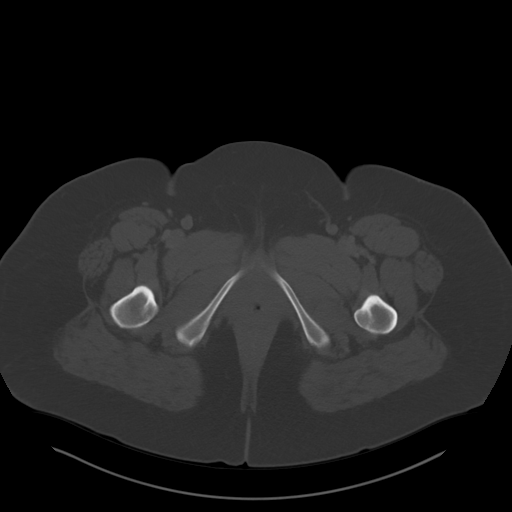
[im 11/92  soft-tissue]
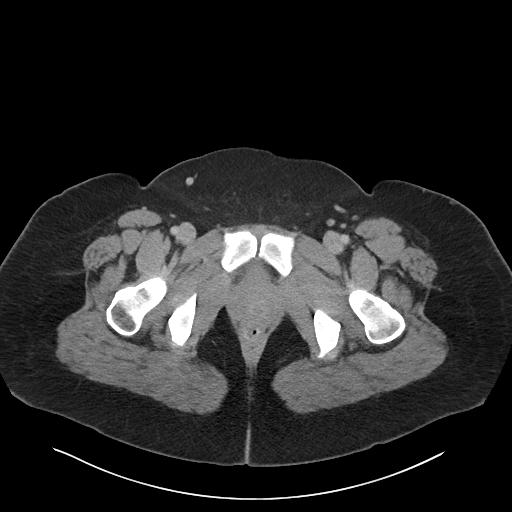
[im 21/92  soft-tissue]
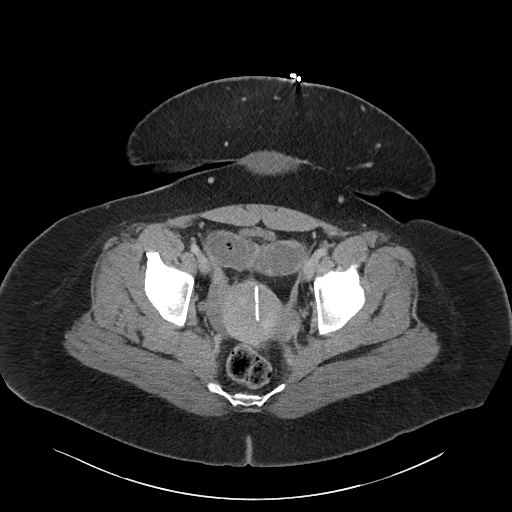
[im 26/92  soft-tissue]
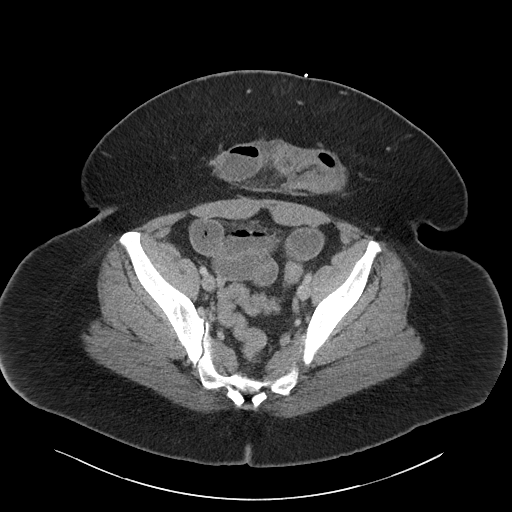
[im 31/92  soft-tissue]
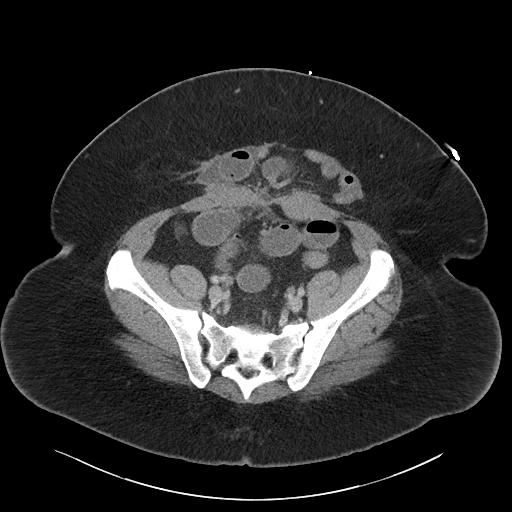
[im 36/92  soft-tissue]
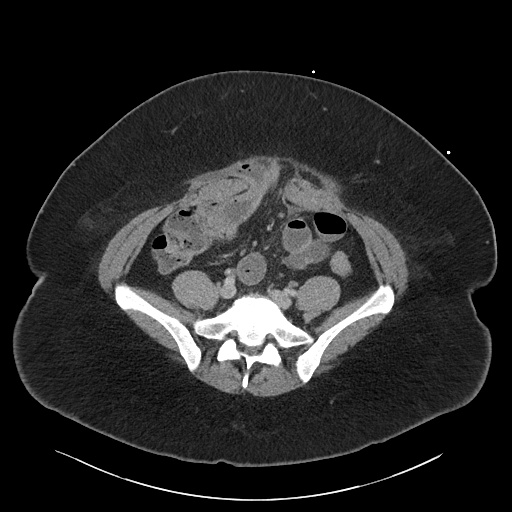
[im 41/92  soft-tissue]
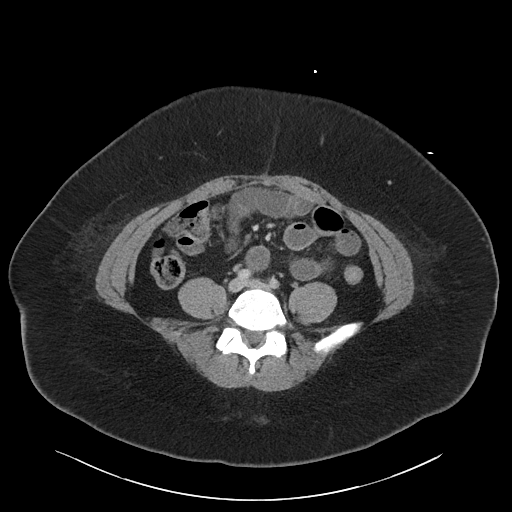
[im 51/92  soft-tissue]
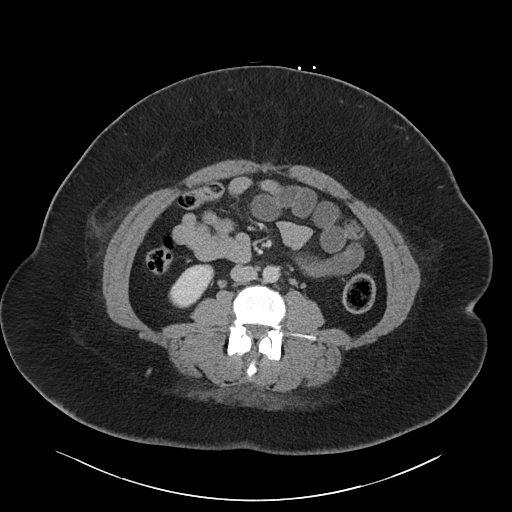
[im 56/92  soft-tissue]
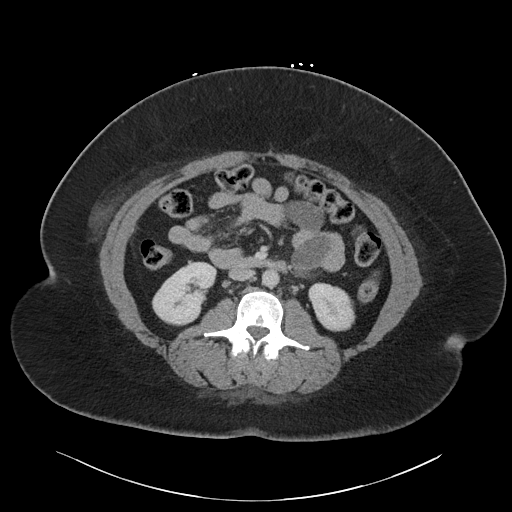
[im 56/92  bone]
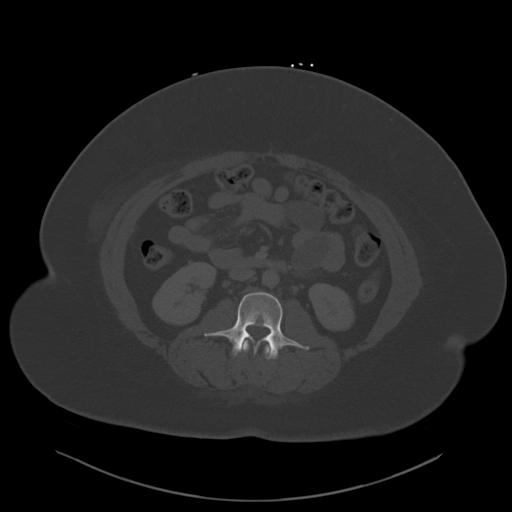
[im 61/92  soft-tissue]
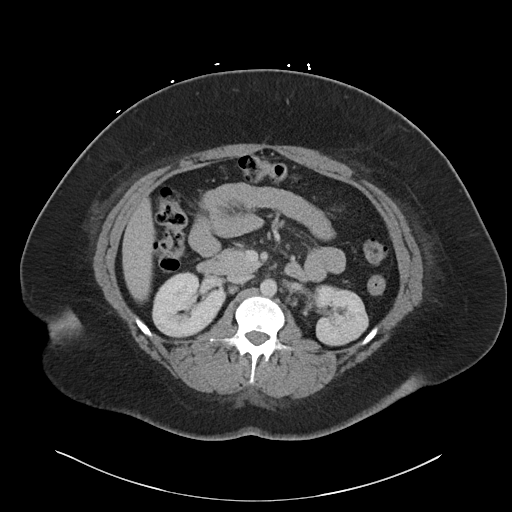
[im 66/92  soft-tissue]
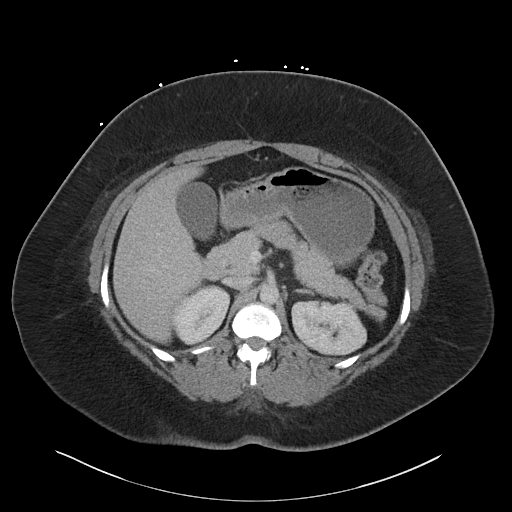
[im 71/92  soft-tissue]
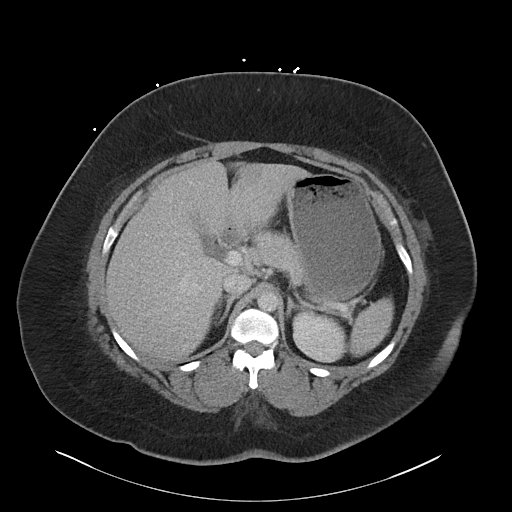
[im 81/92  soft-tissue]
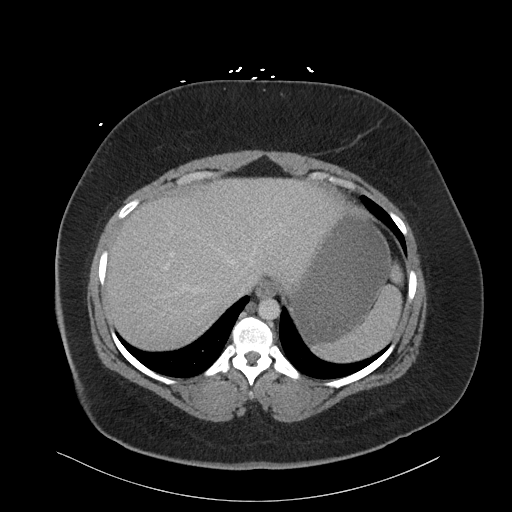
[im 86/92  soft-tissue]
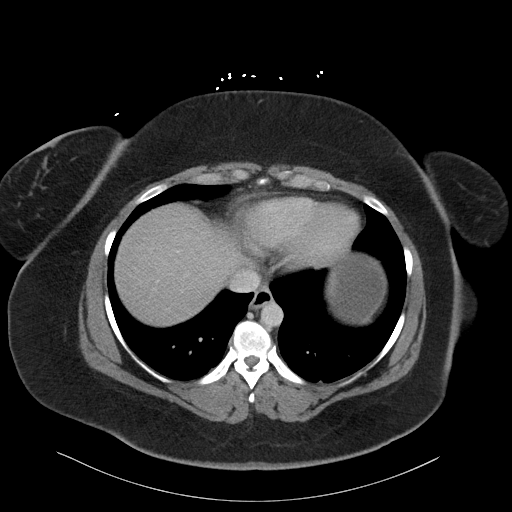

[Series 5: coronal · coronal · 0.85mm/px · 3 of 125 slices shown]
[im 42/125  soft-tissue]
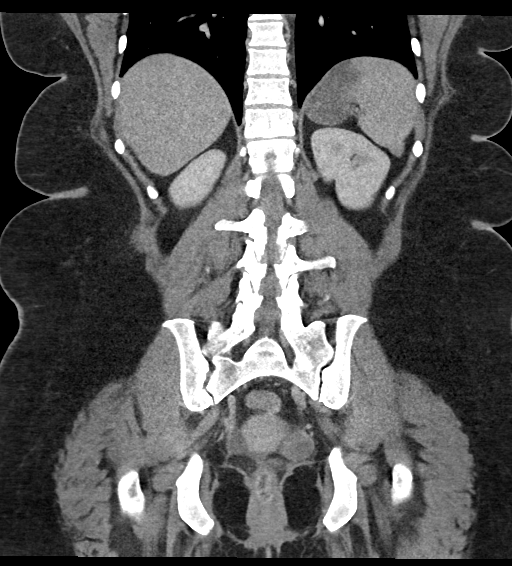
[im 56/125  soft-tissue]
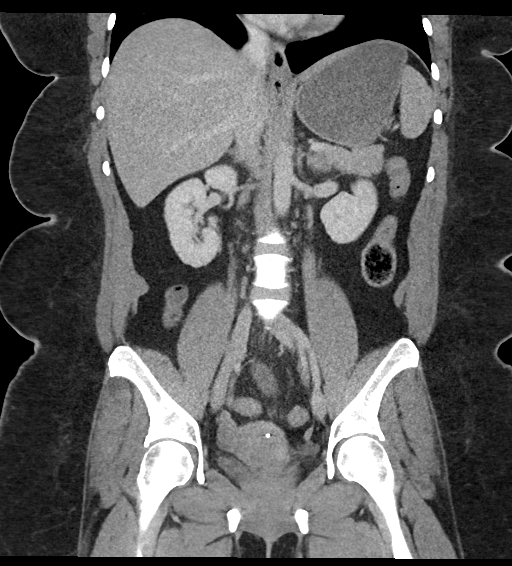
[im 69/125  soft-tissue]
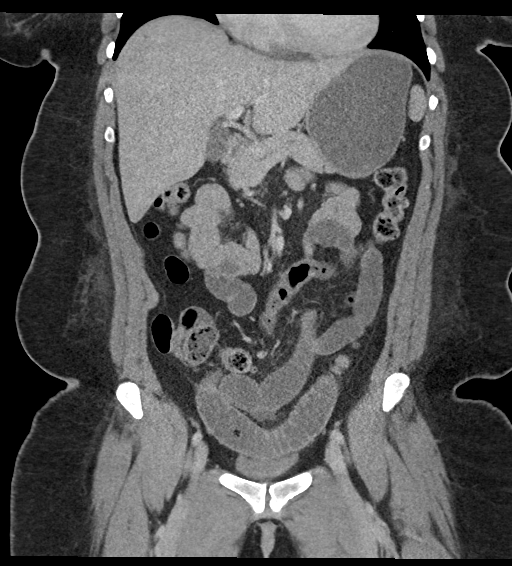

[17 of 46 positions shown; findings below may reference images not displayed]

FINDINGS: Lower chest: No acute abnormality.

Hepatobiliary: Low-density adjacent to the falciform ligament,
likely focal fatty infiltration. Gallbladder unremarkable.

Pancreas: No focal abnormality or ductal dilatation.

Spleen: No focal abnormality.  Normal size.

Adrenals/Urinary Tract: No adrenal abnormality. No focal renal
abnormality. No stones or hydronephrosis. Urinary bladder is
unremarkable.

Stomach/Bowel: Infraumbilical ventral hernia containing small bowel
loops. Prominent fluid-filled small bowel loops in the lower abdomen
and pelvis concerning for low grade small bowel obstruction. Stomach
is moderately distended with fluid. Large bowel grossly
unremarkable.

Vascular/Lymphatic: No evidence of aneurysm or adenopathy.

Reproductive: Uterus and adnexa unremarkable. No mass. IUD within
the uterus.

Other: No free fluid or free air.

Musculoskeletal: No acute bony abnormality.
IMPRESSION: Infraumbilical ventral hernia containing multiple small bowel loops.
Small bowel loops in the lower abdomen and pelvis are mildly dilated
and fluid-filled. Findings concerning for early/low grade small
bowel obstruction.

## 2021-12-28 IMAGING — DX DG ABD PORTABLE 1V
2 series · 2 of 2 positions shown · non-contrast
Comparison: Film from earlier in the same day.

CLINICAL DATA: Abdominal pain, follow up small bowel.

EXAM:
PORTABLE ABDOMEN - 1 VIEW

[abdomen kub (1 of 2)]
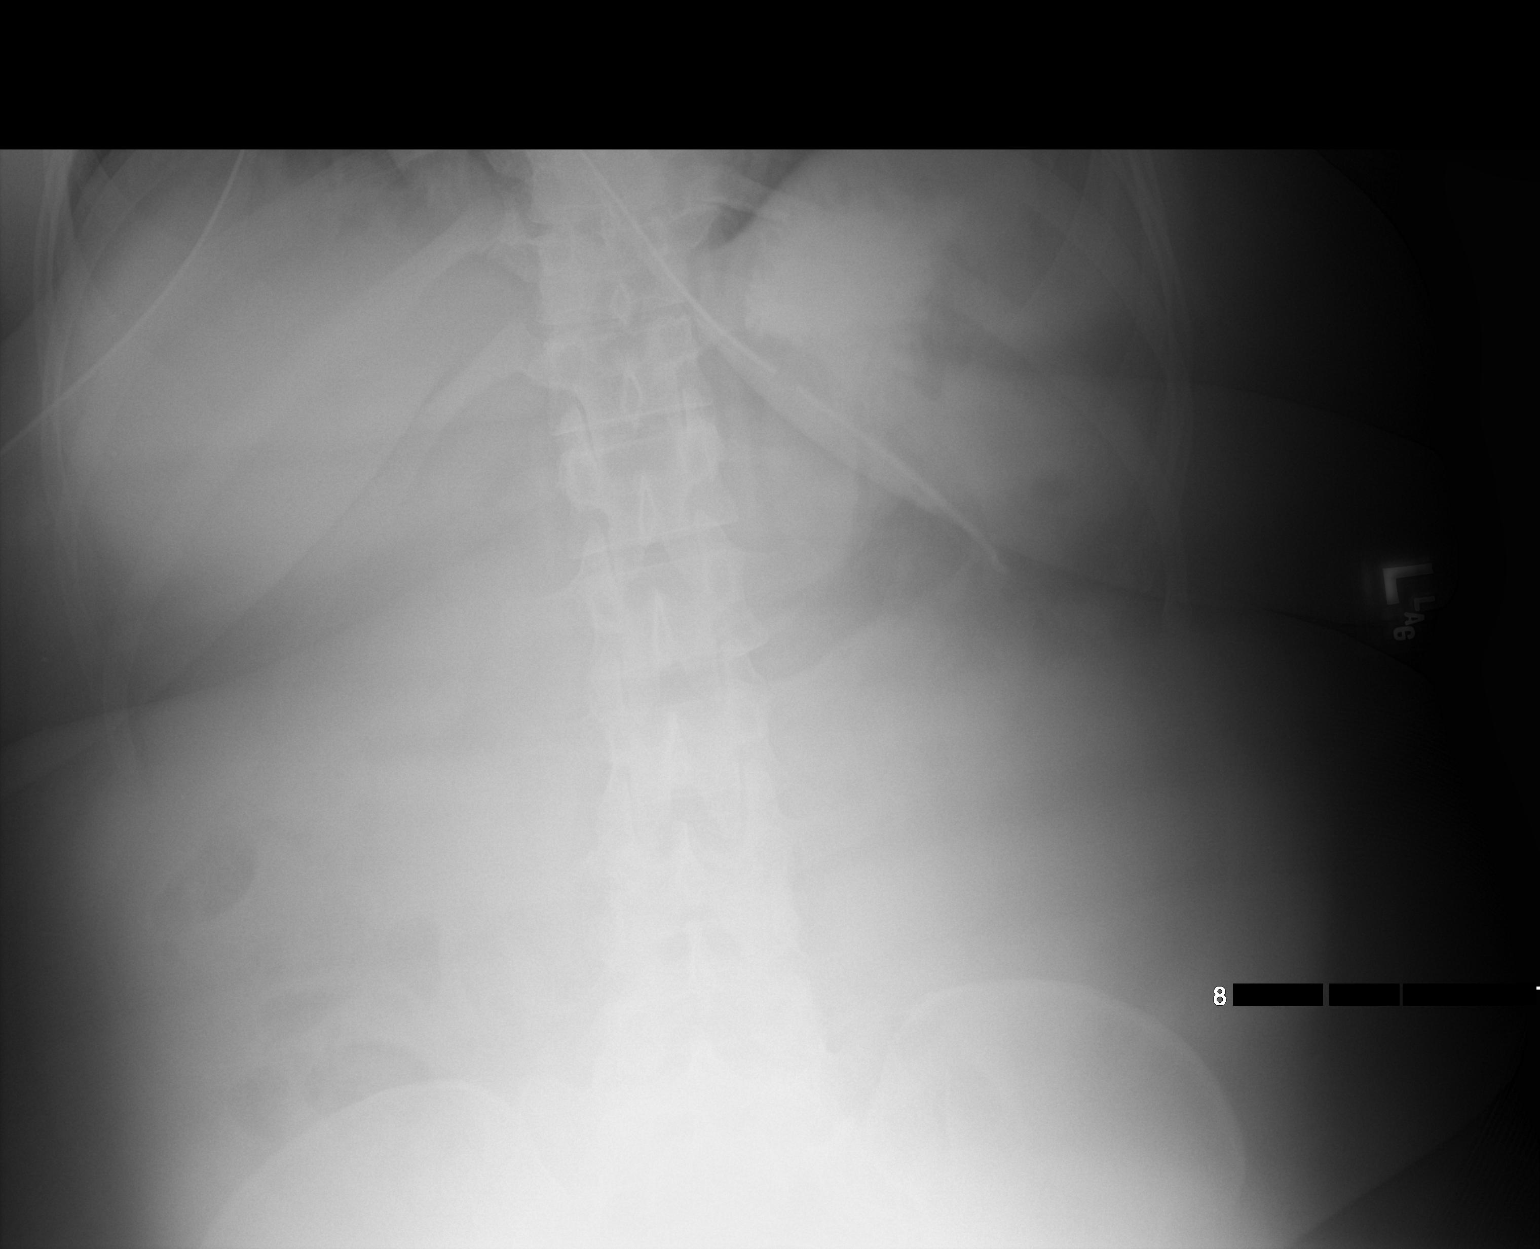

[abdomen kub (2 of 2)]
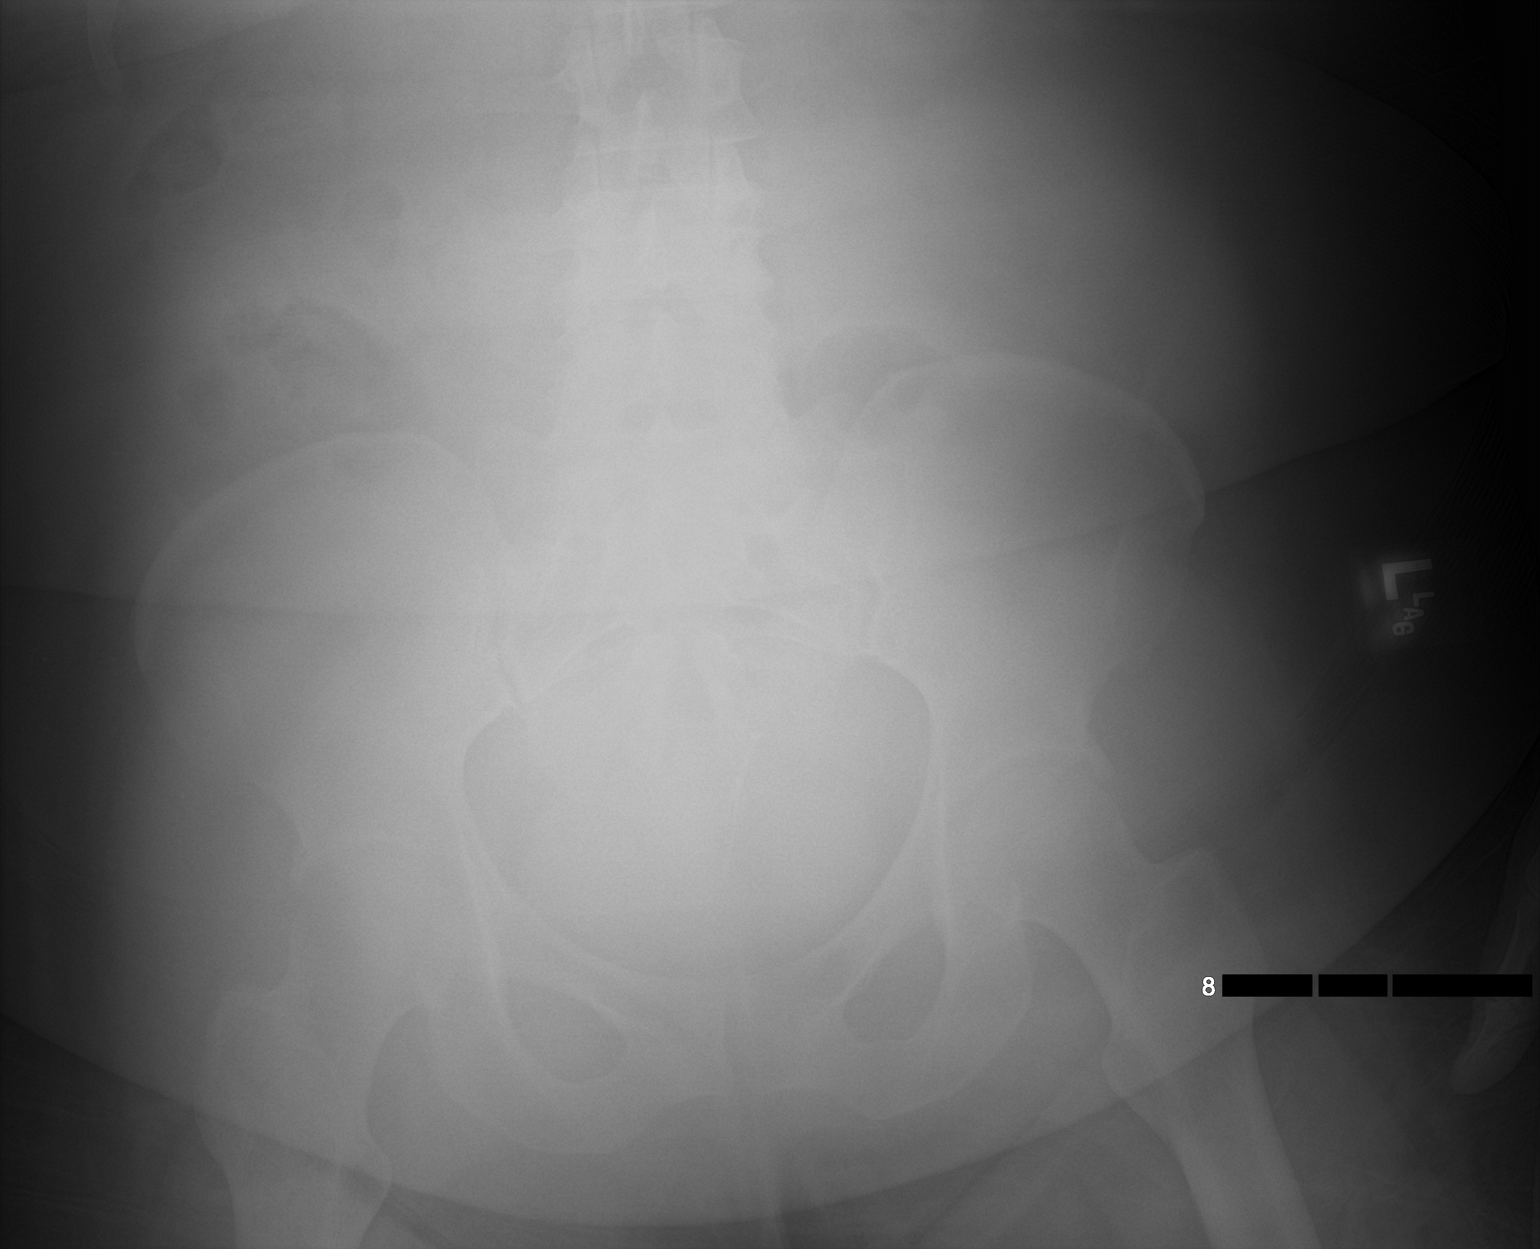

[2 of 2 positions shown; findings below may reference images not displayed]

FINDINGS: Nasogastric catheter remains in place. Some of the previously
administered contrast lies in the gastric fundus. Contrast within
the small bowel is difficult to evaluate due to patient's body
habitus. The degree of small-bowel dilatation seen on prior CT is
not appreciated possibly related to fluid-filled bowel loops. No
free air is noted.
IMPRESSION: Administered contrast is not well evaluated distally. No obstructive
pattern is noted although previously dilated loops of small bowel
were fluid-filled and likely difficult to visualized on plain film.

## 2021-12-28 IMAGING — DX DG ABDOMEN 1V
1 series · 1 of 1 positions shown · non-contrast
Comparison: None.

CLINICAL DATA: Nasogastric tube placement

EXAM:
ABDOMEN - 1 VIEW

[abdomen kub]
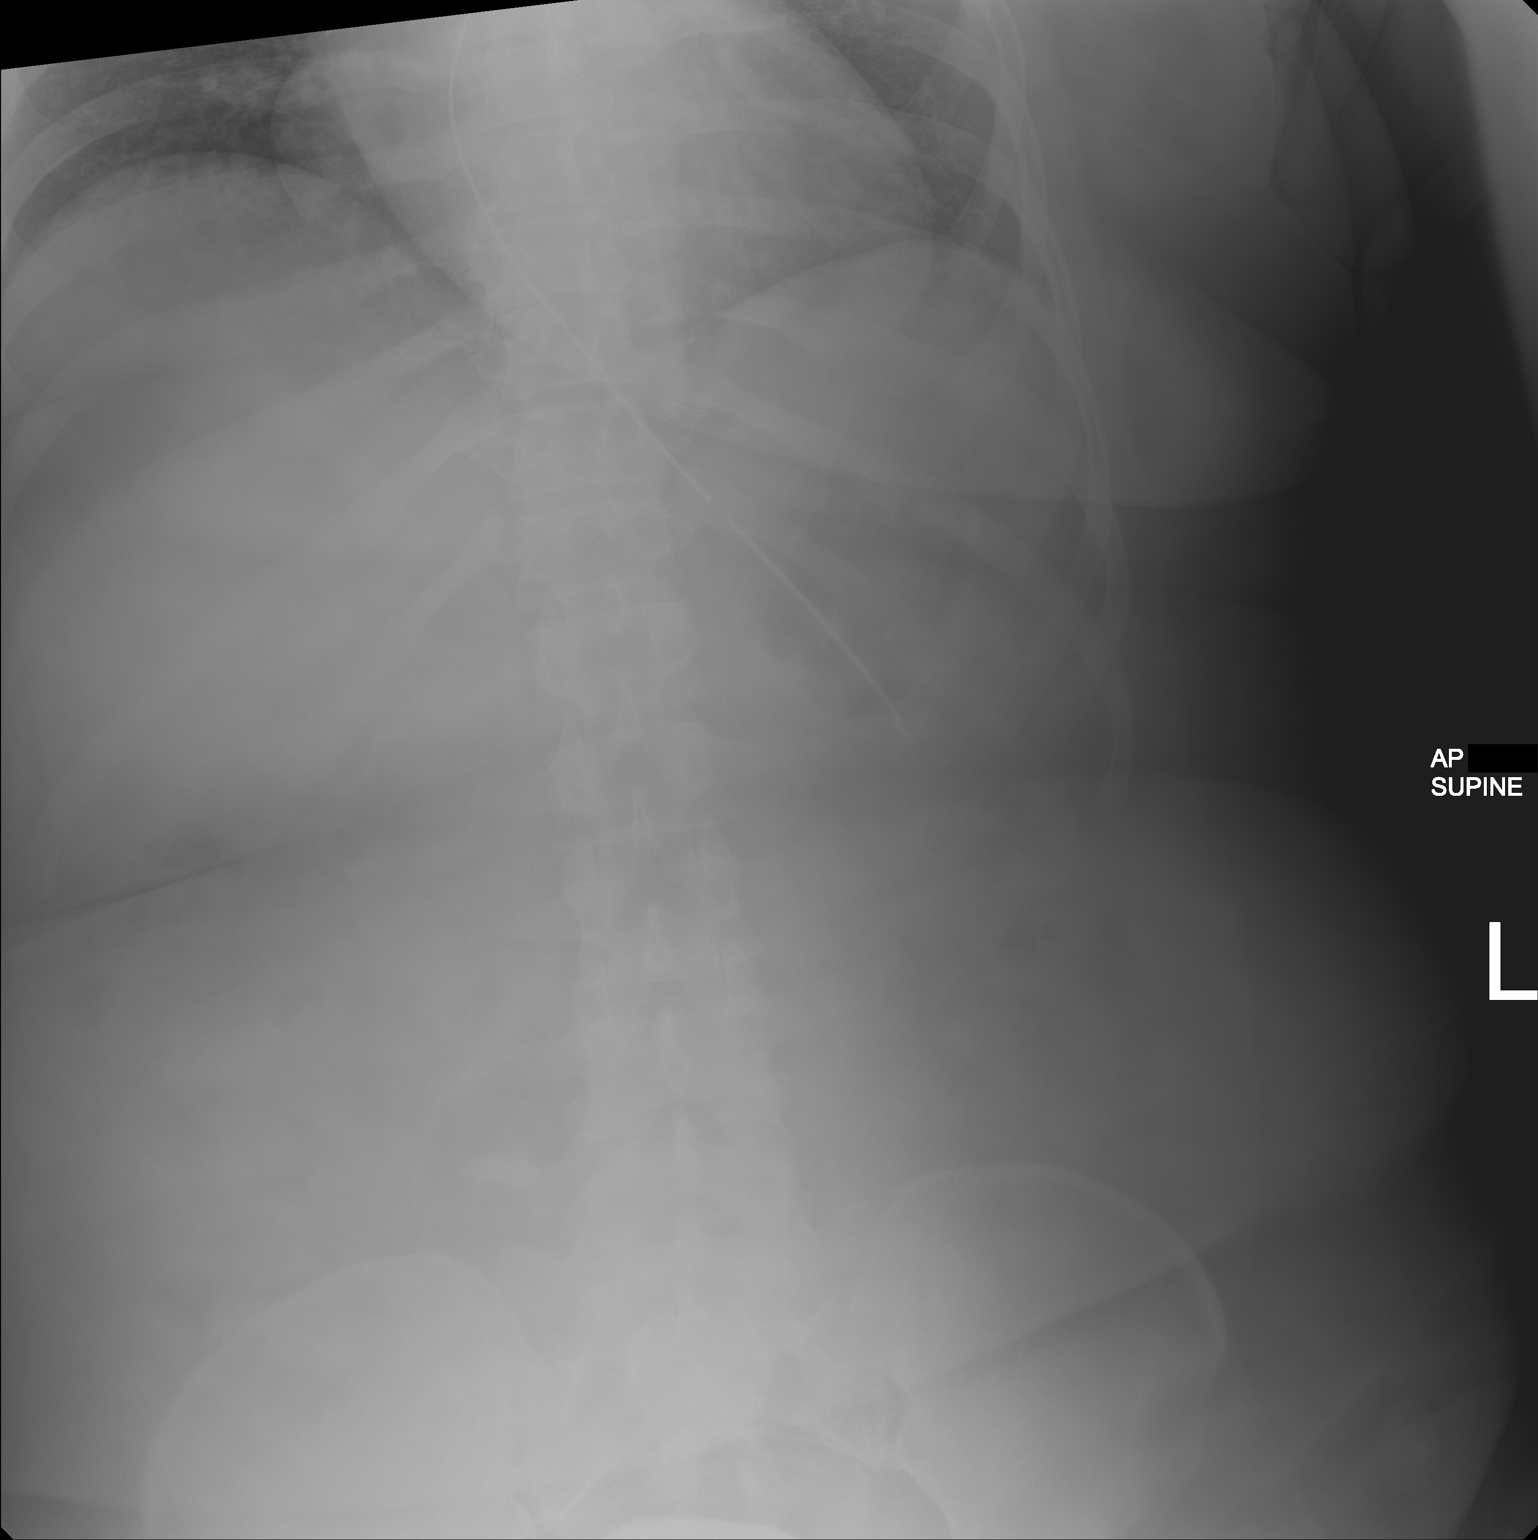

[1 of 1 positions shown; findings below may reference images not displayed]

FINDINGS: Nasogastric tube tip overlies the proximal to mid body of the
stomach. Indeterminate bowel-gas pattern due to a paucity of
intra-abdominal gas. No gross free intraperitoneal gas.
IMPRESSION: Nasogastric tube tip within the proximal to mid body of the stomach.

## 2022-01-02 DIAGNOSIS — R0989 Other specified symptoms and signs involving the circulatory and respiratory systems: Secondary | ICD-10-CM | POA: Diagnosis not present

## 2022-01-02 DIAGNOSIS — Z20822 Contact with and (suspected) exposure to covid-19: Secondary | ICD-10-CM | POA: Diagnosis not present

## 2022-01-02 DIAGNOSIS — R519 Headache, unspecified: Secondary | ICD-10-CM | POA: Diagnosis not present

## 2022-01-02 DIAGNOSIS — R051 Acute cough: Secondary | ICD-10-CM | POA: Diagnosis not present

## 2022-01-03 ENCOUNTER — Other Ambulatory Visit (HOSPITAL_COMMUNITY): Payer: Self-pay

## 2022-01-05 ENCOUNTER — Other Ambulatory Visit (HOSPITAL_COMMUNITY): Payer: Self-pay

## 2022-01-10 ENCOUNTER — Other Ambulatory Visit (HOSPITAL_COMMUNITY): Payer: Self-pay

## 2022-01-12 ENCOUNTER — Other Ambulatory Visit: Payer: Self-pay | Admitting: Family Medicine

## 2022-01-12 ENCOUNTER — Other Ambulatory Visit (HOSPITAL_COMMUNITY): Payer: Self-pay

## 2022-01-12 ENCOUNTER — Ambulatory Visit (HOSPITAL_BASED_OUTPATIENT_CLINIC_OR_DEPARTMENT_OTHER)
Admission: RE | Admit: 2022-01-12 | Discharge: 2022-01-12 | Disposition: A | Discharge status: Home / Self Care | Payer: BC Managed Care – PPO | Source: Ambulatory Visit | Attending: Family Medicine | Admitting: Family Medicine

## 2022-01-12 ENCOUNTER — Encounter: Payer: Self-pay | Admitting: Family Medicine

## 2022-01-12 ENCOUNTER — Ambulatory Visit (INDEPENDENT_AMBULATORY_CARE_PROVIDER_SITE_OTHER): Payer: BC Managed Care – PPO | Admitting: Family Medicine

## 2022-01-12 VITALS — BP 136/80 | HR 80 | Temp 98.6°F | Resp 18 | Ht 67.0 in | Wt 281.2 lb

## 2022-01-12 DIAGNOSIS — J40 Bronchitis, not specified as acute or chronic: Secondary | ICD-10-CM | POA: Diagnosis not present

## 2022-01-12 DIAGNOSIS — B370 Candidal stomatitis: Secondary | ICD-10-CM | POA: Diagnosis not present

## 2022-01-12 DIAGNOSIS — N760 Acute vaginitis: Secondary | ICD-10-CM

## 2022-01-12 DIAGNOSIS — R059 Cough, unspecified: Secondary | ICD-10-CM | POA: Diagnosis not present

## 2022-01-12 MED ORDER — NYSTATIN 100000 UNIT/ML MT SUSP
5.0000 mL | Freq: Four times a day (QID) | OROMUCOSAL | 0 refills | Status: DC
Start: 2022-01-12 — End: 2022-02-09
  Filled 2022-01-12: qty 60, 3d supply, fill #0

## 2022-01-12 MED ORDER — ALBUTEROL SULFATE (2.5 MG/3ML) 0.083% IN NEBU
2.5000 mg | INHALATION_SOLUTION | Freq: Once | RESPIRATORY_TRACT | Status: AC
  Administered 2022-01-12: 2.5 mg via RESPIRATORY_TRACT

## 2022-01-12 MED ORDER — TERCONAZOLE 0.4 % VA CREA
1.0000 | TOPICAL_CREAM | Freq: Every day | VAGINAL | 0 refills | Status: DC
  Filled 2022-01-12: qty 45, 15d supply, fill #0

## 2022-01-12 MED ORDER — BUDESONIDE 0.5 MG/2ML IN SUSP
0.5000 mg | Freq: Every day | RESPIRATORY_TRACT | 12 refills | Status: DC
  Filled 2022-01-12: qty 60, 30d supply, fill #0

## 2022-01-12 MED ORDER — OZEMPIC (0.25 OR 0.5 MG/DOSE) 2 MG/3ML ~~LOC~~ SOPN
PEN_INJECTOR | SUBCUTANEOUS | 0 refills | Status: DC
  Filled 2022-01-12: qty 3, 28d supply, fill #0

## 2022-01-12 MED ORDER — AZITHROMYCIN 250 MG PO TABS
ORAL_TABLET | ORAL | 0 refills | Status: DC
  Filled 2022-01-12: qty 6, 5d supply, fill #0

## 2022-01-12 MED ORDER — FLUCONAZOLE 150 MG PO TABS
150.0000 mg | ORAL_TABLET | Freq: Every day | ORAL | 0 refills | Status: DC
  Filled 2022-01-12: qty 2, 3d supply, fill #0

## 2022-01-12 MED ORDER — PREDNISONE 10 MG PO TABS
ORAL_TABLET | ORAL | 0 refills | Status: AC
Start: 2022-01-12 — End: 2022-01-25
  Filled 2022-01-12: qty 20, 12d supply, fill #0

## 2022-01-12 NOTE — Progress Notes (Addendum)
? ?Subjective:  ? ?By signing my name below, I, Shehryar Baig, attest that this documentation has been prepared under the direction and in the presence of Donato Schultz, DO. 01/12/2022 ?   ? ? Patient ID: [name], female    DOB: 1981/03/20, 41 y.o.   MRN: [id number] ? ?Chief Complaint  ?Patient presents with  ? Cough  ?  X1 month, non productive cough.  Pt states cough is worse at night and she will throw up from coughing hard. Pt does states fatigue and runny nose  ? ? ?Cough ?Associated symptoms include wheezing.  ?Patient is in today for a office visit.  ? ?She complains of a productive cough for the past month. She also has chest congestion, rhinorrhea, sinus pressure, wheezing, mild sputum production. At night her cough worsens. She has episodes of uncontrollable coughing fits. She reports seeing an urgent care 2 weeks ago to manage her cough and was given prednisone to manage her symptoms. She found no relief after completing her prednisone course. She reports her blood sugar measured normal while taking prednisone. She is using her albuterol inhaler 3-4x daily to manage her symptoms. She is also taking OTC tylenol to manage her symptoms. She denies having any chest pain or back pain.  ?Her blood sugars are measuring normal at home.  ?Lab Results  ?Component Value Date  ? HGBA1C 6.9 (H) 12/12/2021  ? ?She reports losing 7 lb's while taking 2.5 mg ozmepic. She has no new issues while taking it. She weighs 281 lb's and has a BMI of 44.04 kg/m^2 during this visit.  ?Wt Readings from Last 3 Encounters:  ?01/12/22 281 lb 3.2 oz (127.6 kg)  ?12/12/21 288 lb (130.6 kg)  ?10/12/21 277 lb (125.6 kg)  ? ? ?Past Medical History:  ?Diagnosis Date  ? Anemia   ? Asthma   ? Genital herpes   ? Gestational diabetes   ? Obesity   ? Pre-diabetes   ? Thyroid disease   ? ? ?Past Surgical History:  ?Procedure Laterality Date  ? CESAREAN SECTION N/A 08/28/2020  ? Procedure: CESAREAN SECTION;  Surgeon: Olivia Mackie, MD;  Location: [facility] OR;  Service: Obstetrics;  Laterality: N/A;  ? DILATATION & CURETTAGE/HYSTEROSCOPY WITH MYOSURE N/A 09/24/2018  ? Procedure: DILATATION & CURETTAGE/HYSTEROSCOPY WITH MYOSURE POLYPECTOMY;  Surgeon: [name], MD;  Location: [facility] ORS;  Service: Gynecology;  Laterality: N/A;  ? KELOID EXCISION  2008  ? removed @[facility] ----- 10/2011  ? KELOID EXCISION  09/20/2018  ? behind right ear  ? LAPAROTOMY N/A 08/04/2021  ? Procedure: EXPLORATORY LAPAROTOMY;  Surgeon: 08/06/2021, MD;  Location: [facility] ORS;  Service: General;  Laterality: N/A;  ? SALIVARY GLAND SURGERY    ? 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 VENTRAL HERNIA REPAIR N/A 08/04/2021  ? Procedure: DIAGNOSTIC LAPAROSCOPY; OPEN PRIMARY REPAIR INCARCERATED INCISIONAL HERNIA;  Surgeon: 08/06/2021, MD;  Location: [facility] ORS;  Service: General;  Laterality: N/A;  ? ? ?Family History  ?Problem Relation Age of Onset  ? Hyperlipidemia Mother   ? Hypertension Mother   ? Depression Father   ? Hyperlipidemia Father   ? Heart disease Father 1  ?     MI  ? Hypertension Father   ? Cancer Father 43  ?     prostate  ? Cancer Paternal Grandfather   ?     prostate  ? Coronary artery disease Other   ? Prostate cancer Other   ? Cancer Maternal Grandfather   ?  prostate  ? ? ?Social History  ? ?Socioeconomic History  ? Marital status: Married  ?  Spouse name: Not on file  ? Number of children: Not on file  ? Years of education: Not on file  ? Highest education level: Not on file  ?Occupational History  ? 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  Employer: [employer]  ?Tobacco Use  ? Smoking status: Never  ?  Passive exposure: Never  ? Smokeless tobacco: Never  ?Vaping Use  ? Vaping Use: Never used  ?Substance and Sexual Activity  ? Alcohol use: Yes  ?  Comment: SOCIAL   ? Drug use: No  ? Sexual activity: Yes  ?  Partners: Male  ?  Birth control/protection: None  ?  Comment: 1st intercourse- 16, partners- 10, current partner- 4 yrs   ?Other Topics Concern  ? Not on  file  ?Social History Narrative  ? Not on file  ? ?Social Determinants of Health  ? ?Financial Resource Strain: Not on file  ?Food Insecurity: Not on file  ?Transportation Needs: Not on file  ?Physical Activity: Not on file  ?Stress: Not on file  ?Social Connections: Not on file  ?Intimate Partner Violence: Not on file  ? ? ?Outpatient Medications Prior to Visit  ?Medication Sig Dispense Refill  ? albuterol (PROAIR HFA) 108 (90 Base) MCG/ACT inhaler Inhale 2 puffs into the lungs every 6 (six) hours as needed. (Patient taking differently: Inhale 2 puffs into the lungs every 6 (six) hours as needed for wheezing or shortness of breath.) 6.7 g 1  ? carvedilol (COREG) 3.125 MG tablet Take 1 tablet (3.125 mg total) by mouth 2 (two) times daily with a meal. 60 tablet 5  ? dapagliflozin propanediol (FARXIGA) 10 MG TABS tablet Take 1 tablet (10 mg total) by mouth daily before breakfast. 30 tablet 11  ? FLUoxetine (PROZAC) 20 MG capsule Take 1 capsule (20 mg total) by mouth daily. 90 capsule 1  ? furosemide (LASIX) 20 MG tablet Take 2 tablets (40 mg total) by mouth daily. 60 tablet 5  ? hydrALAZINE (APRESOLINE) 25 MG tablet Take 1 tablet (25 mg total) by mouth 3 (three) times daily. 90 tablet 3  ? isosorbide dinitrate (ISORDIL) 10 MG tablet Take 1 tablet (10 mg total) by mouth 3 (three) times daily. 90 tablet 3  ? ivabradine (CORLANOR) 5 MG TABS tablet Take 1 tablet (5 mg total) by mouth 2 (two) times daily with a meal. 60 tablet 3  ? ofloxacin (FLOXIN OTIC) 0.3 % OTIC solution Place 5 drops into the right ear daily. 5 mL 0  ? potassium chloride SA (KLOR-CON M) 20 MEQ tablet Take 1 tablet (20 mEq total) by mouth daily. 90 tablet 3  ? sacubitril-valsartan (ENTRESTO) 97-103 MG Take 1 tablet by mouth 2 (two) times daily. 180 tablet 3  ? spironolactone (ALDACTONE) 25 MG tablet Take 1 tablet (25 mg total) by mouth daily. 30 tablet 11  ? valACYclovir (VALTREX) 1000 MG tablet Take 1 tablet (1,000 mg total) by mouth daily. 30 tablet  0  ? fluconazole (DIFLUCAN) 150 MG tablet Take 1 tablet (150 mg total) by mouth daily for 1 dose, may repeat in 3 days if needed 2 tablet 0  ? Semaglutide,0.25 or 0.5MG /DOS, (OZEMPIC, 0.25 OR 0.5 MG/DOSE,) 2 MG/1.5ML SOPN Inject 0.25 mg into the skin once a week for 28 days, THEN 0.5 mg once a week for 28 days. 1.5 mL 1  ? ?No facility-administered medications prior to visit.  ? ? ?  Allergies  ?Allergen Reactions  ? Clomiphene Hives  ?  Hives and lips swelling.  ? ? ?Review of Systems  ?HENT:    ?     (+)rhinorrhea ?(+)sinus pressure  ?Respiratory:  Positive for cough, sputum production and wheezing.   ?     (+)chest congestion  ? ?   ?Objective:  ?  ?Physical Exam ?Constitutional:   ?   General: She is not in acute distress. ?   Appearance: Normal appearance. She is not ill-appearing.  ?HENT:  ?   Head: Normocephalic and atraumatic.  ?   Right Ear: External ear normal.  ?   Left Ear: External ear normal.  ?Eyes:  ?   Extraocular Movements: Extraocular movements intact.  ?   Pupils: Pupils are equal, round, and reactive to light.  ?Cardiovascular:  ?   Rate and Rhythm: Normal rate and regular rhythm.  ?   Heart sounds: Normal heart sounds. No murmur heard. ?  No gallop.  ?Pulmonary:  ?   Effort: Pulmonary effort is normal. No respiratory distress.  ?   Breath sounds: Wheezing and rales (Left side) present.  ?Lymphadenopathy:  ?   Cervical: Cervical adenopathy present.  ?Skin: ?   General: Skin is warm and dry.  ?Neurological:  ?   Mental Status: She is alert and oriented to person, place, and time.  ?Psychiatric:     ?   Judgment: Judgment normal.  ? ? ?BP 136/80 (BP Location: Right Arm, Patient Position: Sitting, Cuff Size: Large)   Pulse 80   Temp 98.6 ?F (37 ?C) (Oral)   Resp 18   Ht 5\' 7"  (1.702 m)   Wt 281 lb 3.2 oz (127.6 kg)   SpO2 96%   BMI 44.04 kg/m?  ?Wt Readings from Last 3 Encounters:  ?01/12/22 281 lb 3.2 oz (127.6 kg)  ?12/12/21 288 lb (130.6 kg)  ?10/12/21 277 lb (125.6 kg)  ? ? ?Diabetic Foot  Exam - Simple   ?No data filed ?  ? ?Lab Results  ?Component Value Date  ? WBC 13.6 (H) 09/29/2021  ? HGB 12.7 09/29/2021  ? HCT 40.1 09/29/2021  ? PLT 295 09/29/2021  ? GLUCOSE 128 (H) 12/12/2021  ? CHOL 168 03/27

## 2022-01-12 NOTE — Patient Instructions (Signed)
Acute Bronchitis, Adult ? ?Acute bronchitis is sudden inflammation of the main airways (bronchi) that come off the windpipe (trachea) in the lungs. The swelling causes the airways to get smaller and make more mucus than normal. This can make it hard to breathe and can cause coughing or noisy breathing (wheezing). ?Acute bronchitis may last several weeks. The cough may last longer. Allergies, asthma, and exposure to smoke may make the condition worse. ?What are the causes? ?This condition can be caused by germs and by substances that irritate the lungs, including: ?Cold and flu viruses. The most common cause of this condition is the virus that causes the common cold. ?Bacteria. This is less common. ?Breathing in substances that irritate the lungs, including: ?Smoke from cigarettes and other forms of tobacco. ?Dust and pollen. ?Fumes from household cleaning products, gases, or burned fuel. ?Indoor or outdoor air pollution. ?What increases the risk? ?The following factors may make you more likely to develop this condition: ?A weak body's defense system, also called the immune system. ?A condition that affects your lungs and breathing, such as asthma. ?What are the signs or symptoms? ?Common symptoms of this condition include: ?Coughing. This may bring up clear, yellow, or green mucus from your lungs (sputum). ?Wheezing. ?Runny or stuffy nose. ?Having too much mucus in your lungs (chest congestion). ?Shortness of breath. ?Aches and pains, including sore throat or chest. ?How is this diagnosed? ?This condition is usually diagnosed based on: ?Your symptoms and medical history. ?A physical exam. ?You may also have other tests, including tests to rule out other conditions, such as pneumonia. These tests include: ?A test of lung function. ?Test of a mucus sample to look for the presence of bacteria. ?Tests to check the oxygen level in your blood. ?Blood tests. ?Chest X-ray. ?How is this treated? ?Most cases of acute  bronchitis clear up over time without treatment. Your health care provider may recommend: ?Drinking more fluids to help thin your mucus so it is easier to cough up. ?Taking inhaled medicine (inhaler) to improve air flow in and out of your lungs. ?Using a vaporizer or a humidifier. These are machines that add water to the air to help you breathe better. ?Taking a medicine that thins mucus and clears congestion (expectorant). ?Taking a medicine that prevents or stops coughing (cough suppressant). ?It is notcommon to take an antibiotic medicine for this condition. ?Follow these instructions at home: ? ?Take over-the-counter and prescription medicines only as told by your health care provider. ?Use an inhaler, vaporizer, or humidifier as told by your health care provider. ?Take two teaspoons (10 mL) of honey at bedtime to lessen coughing at night. ?Drink enough fluid to keep your urine pale yellow. ?Do not use any products that contain nicotine or tobacco. These products include cigarettes, chewing tobacco, and vaping devices, such as e-cigarettes. If you need help quitting, ask your health care provider. ?Get plenty of rest. ?Return to your normal activities as told by your health care provider. Ask your health care provider what activities are safe for you. ?Keep all follow-up visits. This is important. ?How is this prevented? ?To lower your risk of getting this condition again: ?Wash your hands often with soap and water for at least 20 seconds. If soap and water are not available, use hand sanitizer. ?Avoid contact with people who have cold symptoms. ?Try not to touch your mouth, nose, or eyes with your hands. ?Avoid breathing in smoke or chemical fumes. Breathing smoke or chemical fumes will make your   condition worse. ?Get the flu shot every year. ?Contact a health care provider if: ?Your symptoms do not improve after 2 weeks. ?You have trouble coughing up the mucus. ?Your cough keeps you awake at night. ?You have a  fever. ?Get help right away if you: ?Cough up blood. ?Feel pain in your chest. ?Have severe shortness of breath. ?Faint or keep feeling like you are going to faint. ?Have a severe headache. ?Have a fever or chills that get worse. ?These symptoms may represent a serious problem that is an emergency. Do not wait to see if the symptoms will go away. Get medical help right away. Call your local emergency services (911 in the [place] Do not drive yourself to the hospital. ?Summary ?Acute bronchitis is inflammation of the main airways (bronchi) that come off the windpipe (trachea) in the lungs. The swelling causes the airways to get smaller and make more mucus than normal. ?Drinking more fluids can help thin your mucus so it is easier to cough up. ?Take over-the-counter and prescription medicines only as told by your health care provider. ?Do not use any products that contain nicotine or tobacco. These products include cigarettes, chewing tobacco, and vaping devices, such as e-cigarettes. If you need help quitting, ask your health care provider. ?Contact a health care provider if your symptoms do not improve after 2 weeks. ?This information is not intended to replace advice given to you by your health care provider. Make sure you discuss any questions you have with your health care provider. ?Document Revised: 01/05/2021 Document Reviewed: 01/05/2021 ?Elsevier Patient Education ? 2023 [employer] ? ?

## 2022-02-05 ENCOUNTER — Encounter: Payer: Self-pay | Admitting: Family Medicine

## 2022-02-05 ENCOUNTER — Other Ambulatory Visit: Payer: Self-pay | Admitting: Family Medicine

## 2022-02-06 ENCOUNTER — Other Ambulatory Visit (HOSPITAL_COMMUNITY): Payer: Self-pay

## 2022-02-06 MED ORDER — SEMAGLUTIDE (1 MG/DOSE) 4 MG/3ML ~~LOC~~ SOPN
1.0000 mg | PEN_INJECTOR | SUBCUTANEOUS | 0 refills | Status: DC
  Filled 2022-02-06 – 2022-02-16 (×2): qty 3, 28d supply, fill #0

## 2022-02-06 NOTE — Telephone Encounter (Signed)
Okay to increase?

## 2022-02-09 ENCOUNTER — Encounter (HOSPITAL_COMMUNITY): Payer: Self-pay | Admitting: Internal Medicine

## 2022-02-09 ENCOUNTER — Ambulatory Visit (HOSPITAL_COMMUNITY)
Admission: RE | Admit: 2022-02-09 | Discharge: 2022-02-09 | Disposition: A | Discharge status: Home / Self Care | Payer: BC Managed Care – PPO | Source: Ambulatory Visit | Attending: Internal Medicine | Admitting: Internal Medicine

## 2022-02-09 ENCOUNTER — Ambulatory Visit (HOSPITAL_BASED_OUTPATIENT_CLINIC_OR_DEPARTMENT_OTHER)
Admission: RE | Admit: 2022-02-09 | Discharge: 2022-02-09 | Disposition: A | Discharge status: Home / Self Care | Payer: BC Managed Care – PPO | Source: Ambulatory Visit | Attending: Internal Medicine | Admitting: Internal Medicine

## 2022-02-09 VITALS — BP 120/88 | HR 66 | Wt 279.8 lb

## 2022-02-09 DIAGNOSIS — G4733 Obstructive sleep apnea (adult) (pediatric): Secondary | ICD-10-CM | POA: Diagnosis not present

## 2022-02-09 DIAGNOSIS — Z6841 Body Mass Index (BMI) 40.0 and over, adult: Secondary | ICD-10-CM | POA: Insufficient documentation

## 2022-02-09 DIAGNOSIS — E118 Type 2 diabetes mellitus with unspecified complications: Secondary | ICD-10-CM | POA: Insufficient documentation

## 2022-02-09 DIAGNOSIS — I5022 Chronic systolic (congestive) heart failure: Secondary | ICD-10-CM | POA: Diagnosis not present

## 2022-02-09 DIAGNOSIS — Z7984 Long term (current) use of oral hypoglycemic drugs: Secondary | ICD-10-CM | POA: Diagnosis not present

## 2022-02-09 DIAGNOSIS — T465X6A Underdosing of other antihypertensive drugs, initial encounter: Secondary | ICD-10-CM | POA: Insufficient documentation

## 2022-02-09 DIAGNOSIS — I429 Cardiomyopathy, unspecified: Secondary | ICD-10-CM | POA: Insufficient documentation

## 2022-02-09 DIAGNOSIS — I110 Hypertensive heart disease with heart failure: Secondary | ICD-10-CM | POA: Diagnosis not present

## 2022-02-09 DIAGNOSIS — E119 Type 2 diabetes mellitus without complications: Secondary | ICD-10-CM | POA: Diagnosis not present

## 2022-02-09 DIAGNOSIS — Z79899 Other long term (current) drug therapy: Secondary | ICD-10-CM | POA: Diagnosis not present

## 2022-02-09 DIAGNOSIS — J45909 Unspecified asthma, uncomplicated: Secondary | ICD-10-CM | POA: Insufficient documentation

## 2022-02-09 DIAGNOSIS — I5082 Biventricular heart failure: Secondary | ICD-10-CM | POA: Insufficient documentation

## 2022-02-09 LAB — ECHOCARDIOGRAM COMPLETE
AR max vel: 1.31 cm2
AV Area VTI: 1.35 cm2
AV Area mean vel: 1.22 cm2
AV Mean grad: 5.5 mmHg
AV Peak grad: 9.2 mmHg
Ao pk vel: 1.52 m/s
Area-P 1/2: 3.74 cm2
Calc EF: 45.8 %
S' Lateral: 3.9 cm
Single Plane A2C EF: 32.8 %
Single Plane A4C EF: 54.1 %

## 2022-02-09 MED ORDER — IVABRADINE HCL 5 MG PO TABS: 2.5000 mg | ORAL_TABLET | Freq: Two times a day (BID) | ORAL | 3 refills | Status: DC

## 2022-02-09 NOTE — Patient Instructions (Signed)
Decrease Corlanor 2.5 mg (1/2 tab) Twice daily  Your physician recommends that you schedule a follow-up appointment in: 6 months, **PLEASE CALL OUR OFFICE IN September TO SCHEDULE THIS APPOINTMENT  If you have any questions or concerns before your next appointment please send [place] a message through [place] or call our office at [phone].    TO LEAVE A MESSAGE FOR THE NURSE SELECT OPTION 2, PLEASE LEAVE A MESSAGE INCLUDING: YOUR NAME DATE OF BIRTH CALL BACK NUMBER REASON FOR CALL**this is important as we prioritize the call backs  YOU WILL RECEIVE A CALL BACK THE SAME DAY AS LONG AS YOU CALL BEFORE 4:00 PM  At the [facility], you and your health needs are our priority. As part of our continuing mission to provide you with exceptional heart care, we have created designated Provider Care Teams. These Care Teams include your primary Cardiologist (physician) and Advanced Practice Providers (APPs- Physician Assistants and Nurse Practitioners) who all work together to provide you with the care you need, when you need it.   You may see any of the following providers on your designated Care Team at your next follow up: Dr Arvilla Meres Dr Carron Curie, NP Robbie Lis, [facility] [place], [place] Karle Plumber, PharmD   Please be sure to bring in all your medications bottles to every appointment.

## 2022-02-09 NOTE — Progress Notes (Signed)
  Echocardiogram 2D Echocardiogram has been performed.  [name] F 02/09/2022, 10:04 AM

## 2022-02-09 NOTE — Progress Notes (Addendum)
Advanced Heart Failure Clinic Note   PCP: Donato Schultz, DO PCP-Cardiologist: Chilton Si, MD  HF Cardiologist: Dr. Gala Romney, MD  Reason for Visit: F/u for chronic systolic heart failure   HPI:  [name] is a 41 year old female with a history of obesity, DM, HTN, asthma, OSA and severe systolic HF.  Echo 2010  EF 55-60%   Admitted 12/21 at [redacted] weeks pregnant, with first baby, with hypertension and lower extremity edema and found to have acute systolic heart failure. Echo completed and showed biventricular dysfunction with EF 15-20%. CO-OX consistent with cardiogenic shock.  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 Denies SOB, orthopnea or PND. Not exercising. Compliant with meds. Not taking BP meds. Had repeat sleep study which was normal. Asking about risks of getting pregnant again.   Echo today 02/09/22 EF 45%    Echo 08/24/20: Biventricular HF EF < 20%  Echo 2/22: EF 30% mild to mod RV dysfunction     FH: Dad has A FIb, Aunt heart failure.  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  Note: Baby Girls' name is Clinical research associate  Past Medical History:  Diagnosis Date   Anemia    Asthma    Genital herpes    Gestational diabetes    Obesity    Pre-diabetes    Thyroid disease    Current Outpatient Medications  Medication Sig Dispense Refill   albuterol (PROAIR HFA) 108 (90 Base) MCG/ACT inhaler Inhale 2 puffs into the lungs every 6 (six) hours as needed. (Patient taking differently: Inhale 2 puffs into the lungs every 6 (six) hours as needed for wheezing or shortness of breath.) 6.7 g 1   carvedilol (COREG) 3.125 MG tablet Take 1 tablet (3.125 mg total) by mouth 2 (two) times daily with a meal. 60 tablet 5   dapagliflozin propanediol (FARXIGA) 10 MG TABS tablet Take 1 tablet (10 mg total) by mouth daily before breakfast. 30 tablet 11   FLUoxetine (PROZAC) 20 MG capsule Take 1 capsule (20  mg total) by mouth daily. 90 capsule 1   furosemide (LASIX) 20 MG tablet Take 2 tablets (40 mg total) by mouth daily. 60 tablet 5   hydrALAZINE (APRESOLINE) 25 MG tablet Take 1 tablet (25 mg total) by mouth 3 (three) times daily. 90 tablet 3   isosorbide dinitrate (ISORDIL) 10 MG tablet Take 1 tablet (10 mg total) by mouth 3 (three) times daily. 90 tablet 3   ivabradine (CORLANOR) 5 MG TABS tablet Take 1 tablet (5 mg total) by mouth 2 (two) times daily with a meal. 60 tablet 3   potassium chloride SA (KLOR-CON M) 20 MEQ tablet Take 1 tablet (20 mEq total) by mouth daily. 90 tablet 3   sacubitril-valsartan (ENTRESTO) 97-103 MG Take 1 tablet by mouth 2 (two) times daily. 180 tablet 3   Semaglutide, 1 MG/DOSE, 4 MG/3ML SOPN Inject 1 mg as directed once a week. 3 mL 0   spironolactone (ALDACTONE) 25 MG tablet Take 1 tablet (25 mg total) by mouth daily. 30 tablet 11   valACYclovir (VALTREX) 1000 MG tablet Take 1 tablet (1,000 mg total) by mouth daily. 30 tablet 0   No current facility-administered medications for this encounter.   Allergies  Allergen Reactions   Clomiphene Hives    Hives and lips swelling.    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    Employer: [employer]  Tobacco Use   Smoking status: Never    Passive exposure: Never   Smokeless tobacco: Never  Vaping Use   Vaping Use: Never used  Substance and Sexual Activity   Alcohol use: Yes    Comment: SOCIAL    Drug use: No   Sexual activity: Yes    Partners: Male    Birth control/protection: None    Comment: 1st intercourse- 65, partners- 10, current partner- 4 yrs   Other Topics Concern   Not on file  Social History Narrative   Not on file   Social Determinants of Health   Financial Resource Strain: Not on  file  Food Insecurity: Not on file  Transportation Needs: Not on file  Physical Activity: Not on file  Stress: Not on file  Social Connections: Not on file  Intimate Partner Violence: Not on file     Family History  Problem Relation Age of Onset   Hyperlipidemia Mother    Hypertension Mother    Depression Father    Hyperlipidemia Father    Heart disease Father 89       MI   Hypertension Father    Cancer Father 46       prostate   Cancer Paternal Grandfather        prostate   Coronary artery disease Other    Prostate cancer Other    Cancer Maternal Grandfather        prostate    BP 120/88   Pulse 66   Wt 126.9 kg (279 lb 12.8 oz)   SpO2 99%   BMI 43.82 kg/m   Wt Readings from Last 3 Encounters:  02/09/22 126.9 kg (279 lb 12.8 oz)  01/12/22 127.6 kg (281 lb 3.2 oz)  12/12/21 130.6 kg (288 lb)   PHYSICAL EXAM: General:  Well appearing. No resp difficulty HEENT: normal Neck: supple. no JVD. Carotids 2+ bilat; no bruits. No lymphadenopathy or thryomegaly appreciated. Cor: PMI nondisplaced. Regular rate & rhythm. No rubs, gallops or murmurs. Lungs: clear Abdomen: obesesoft, nontender, nondistended. No hepatosplenomegaly. No bruits or masses. Good bowel sounds. Extremities: no cyanosis, clubbing, rash, edema Neuro: alert & orientedx3, cranial nerves grossly intact. moves all 4 extremities w/o difficulty. Affect pleasant     ASSESSMENT & PLAN 1. Chronic Biventricular Heart Failure - Echo 12/21 severely reduced EF 15-20% . Possible HTN CM. - C-section 12/21 without complications.  - Repeat Echo: 11/12/20: EF 30% mild to mod RV dysfunction  - Echo today 02/09/22: EF 45% - NYHA II. Volume status ok  - HR today 65 Decrease ivabradine to 2.5  bid  - Volume status ok Continue lasix 40 mg daily. - Continue carvedilol 3.125 mg bid.  - Continue hydralazine 25 tid/isordil 10 tid. - Continue Entresto 97/103 mg bid.  - Continue spiro 25 mg daily. - Continue Farxiga 10 mg  daily for now.  - Discussed risks of recurrent HF with subsequent pregnancies and possible risks of maternal/fetal death, need for transplant, etc. Have strongly advised need to avoid pregnancy. Continue IUD   2. HTN - Controlled on current regimen.    3. DM2 - Per PCP. - On Farxiga.  - No change   4. OSA - Repeat sleep study showed resolution of OSA  5. Obesity - Body mass index is 43.82 kg/m. - Continue GLP1RA - Encouraged more activity    Arvilla Meres, MD 02/09/22

## 2022-02-14 ENCOUNTER — Other Ambulatory Visit (HOSPITAL_COMMUNITY): Payer: Self-pay

## 2022-02-16 ENCOUNTER — Other Ambulatory Visit: Payer: Self-pay | Admitting: Family Medicine

## 2022-02-16 ENCOUNTER — Other Ambulatory Visit (HOSPITAL_COMMUNITY): Payer: Self-pay

## 2022-02-16 DIAGNOSIS — L910 Hypertrophic scar: Secondary | ICD-10-CM | POA: Diagnosis not present

## 2022-03-13 ENCOUNTER — Other Ambulatory Visit: Payer: Self-pay | Admitting: Family Medicine

## 2022-03-13 ENCOUNTER — Encounter: Payer: Self-pay | Admitting: Family Medicine

## 2022-03-13 MED ORDER — SEMAGLUTIDE (2 MG/DOSE) 8 MG/3ML ~~LOC~~ SOPN
2.0000 mg | PEN_INJECTOR | SUBCUTANEOUS | 5 refills | Status: DC
  Filled 2022-03-13 – 2022-04-14 (×2): qty 3, 28d supply, fill #0
  Filled 2022-05-18 – 2022-06-02 (×2): qty 3, 28d supply, fill #1
  Filled 2022-07-11: qty 3, 28d supply, fill #2
  Filled 2022-08-15 – 2022-09-04 (×2): qty 3, 28d supply, fill #3

## 2022-03-13 NOTE — Telephone Encounter (Signed)
Okay to send in the next dose? ?

## 2022-03-14 ENCOUNTER — Other Ambulatory Visit (HOSPITAL_COMMUNITY): Payer: Self-pay

## 2022-04-14 ENCOUNTER — Other Ambulatory Visit (HOSPITAL_COMMUNITY): Payer: Self-pay

## 2022-04-14 ENCOUNTER — Other Ambulatory Visit (HOSPITAL_BASED_OUTPATIENT_CLINIC_OR_DEPARTMENT_OTHER): Payer: Self-pay

## 2022-04-20 DIAGNOSIS — L910 Hypertrophic scar: Secondary | ICD-10-CM | POA: Diagnosis not present

## 2022-05-02 ENCOUNTER — Telehealth: Payer: Self-pay | Admitting: Primary Care

## 2022-05-18 ENCOUNTER — Other Ambulatory Visit (HOSPITAL_BASED_OUTPATIENT_CLINIC_OR_DEPARTMENT_OTHER): Payer: Self-pay

## 2022-06-01 ENCOUNTER — Other Ambulatory Visit (HOSPITAL_BASED_OUTPATIENT_CLINIC_OR_DEPARTMENT_OTHER): Payer: Self-pay

## 2022-06-02 ENCOUNTER — Other Ambulatory Visit (HOSPITAL_BASED_OUTPATIENT_CLINIC_OR_DEPARTMENT_OTHER): Payer: Self-pay

## 2022-06-05 ENCOUNTER — Other Ambulatory Visit (HOSPITAL_BASED_OUTPATIENT_CLINIC_OR_DEPARTMENT_OTHER): Payer: Self-pay

## 2022-06-08 ENCOUNTER — Encounter (HOSPITAL_BASED_OUTPATIENT_CLINIC_OR_DEPARTMENT_OTHER): Payer: Self-pay | Admitting: Cardiovascular Disease

## 2022-06-08 ENCOUNTER — Encounter (HOSPITAL_COMMUNITY): Payer: Self-pay | Admitting: Internal Medicine

## 2022-06-08 ENCOUNTER — Other Ambulatory Visit (HOSPITAL_BASED_OUTPATIENT_CLINIC_OR_DEPARTMENT_OTHER): Payer: Self-pay

## 2022-06-14 ENCOUNTER — Other Ambulatory Visit (HOSPITAL_COMMUNITY): Payer: Self-pay | Admitting: Family Medicine

## 2022-06-14 ENCOUNTER — Other Ambulatory Visit (HOSPITAL_BASED_OUTPATIENT_CLINIC_OR_DEPARTMENT_OTHER): Payer: Self-pay

## 2022-06-15 ENCOUNTER — Other Ambulatory Visit (HOSPITAL_COMMUNITY): Payer: Self-pay

## 2022-06-15 ENCOUNTER — Telehealth (HOSPITAL_COMMUNITY): Payer: Self-pay

## 2022-06-15 MED ORDER — FUROSEMIDE 20 MG PO TABS
40.0000 mg | ORAL_TABLET | Freq: Every day | ORAL | 5 refills | Status: DC
  Filled 2022-06-15 – 2022-12-13 (×3): qty 60, 30d supply, fill #0
  Filled 2022-12-13 – 2023-02-28 (×2): qty 60, 30d supply, fill #1
  Filled 2023-03-20: qty 60, 30d supply, fill #2

## 2022-06-15 NOTE — Telephone Encounter (Signed)
Meds ordered this encounter  Medications   furosemide (LASIX) 20 MG tablet    Sig: Take 2 tablets (40 mg total) by mouth daily.    Dispense:  60 tablet    Refill:  5

## 2022-06-15 NOTE — Telephone Encounter (Signed)
Received a medical clearance form from [facility] , requesting Cardiology clearance for the following procedure: invasive dental procedure. Medical clearance form was signed by Dr.Daniel Bensimhon and successfully faxed to [phone] on Thursday, September 28,. Form will be scanned into patients chart.

## 2022-06-22 DIAGNOSIS — L910 Hypertrophic scar: Secondary | ICD-10-CM | POA: Diagnosis not present

## 2022-06-23 ENCOUNTER — Encounter (HOSPITAL_COMMUNITY): Payer: Self-pay | Admitting: Internal Medicine

## 2022-06-23 ENCOUNTER — Other Ambulatory Visit (HOSPITAL_COMMUNITY): Payer: Self-pay | Admitting: Physician Assistant

## 2022-06-23 ENCOUNTER — Other Ambulatory Visit (HOSPITAL_COMMUNITY): Payer: Self-pay

## 2022-06-23 MED ORDER — IVABRADINE HCL 5 MG PO TABS
5.0000 mg | ORAL_TABLET | Freq: Two times a day (BID) | ORAL | 11 refills | Status: DC
  Filled 2022-06-23: qty 60, 30d supply, fill #0

## 2022-07-11 ENCOUNTER — Other Ambulatory Visit (HOSPITAL_BASED_OUTPATIENT_CLINIC_OR_DEPARTMENT_OTHER): Payer: Self-pay

## 2022-08-15 ENCOUNTER — Other Ambulatory Visit (HOSPITAL_BASED_OUTPATIENT_CLINIC_OR_DEPARTMENT_OTHER): Payer: Self-pay

## 2022-08-30 ENCOUNTER — Other Ambulatory Visit (HOSPITAL_BASED_OUTPATIENT_CLINIC_OR_DEPARTMENT_OTHER): Payer: Self-pay

## 2022-09-01 ENCOUNTER — Other Ambulatory Visit (HOSPITAL_COMMUNITY): Payer: Self-pay

## 2022-09-04 ENCOUNTER — Telehealth (HOSPITAL_COMMUNITY): Payer: Self-pay | Admitting: Pharmacy Technician

## 2022-09-04 ENCOUNTER — Other Ambulatory Visit (HOSPITAL_BASED_OUTPATIENT_CLINIC_OR_DEPARTMENT_OTHER): Payer: Self-pay

## 2022-09-04 ENCOUNTER — Other Ambulatory Visit (HOSPITAL_COMMUNITY): Payer: Self-pay

## 2022-09-04 NOTE — Telephone Encounter (Signed)
Advanced Heart Failure Patient Advocate Encounter  Prior Authorization for Corlanor has been approved.    PA# [id number] Effective dates: 09/04/22 through 09/04/23  Archer Asa, CPhT

## 2022-09-04 NOTE — Telephone Encounter (Signed)
Patient Advocate Encounter   Received notification from [facility] that prior authorization for Corlanor is required.   PA submitted on CoverMyMeds Key  [id number] Status is pending   Will continue to follow.

## 2022-09-07 ENCOUNTER — Other Ambulatory Visit (HOSPITAL_BASED_OUTPATIENT_CLINIC_OR_DEPARTMENT_OTHER): Payer: Self-pay

## 2022-09-21 DIAGNOSIS — L910 Hypertrophic scar: Secondary | ICD-10-CM | POA: Diagnosis not present

## 2022-10-24 ENCOUNTER — Encounter: Payer: Self-pay | Admitting: Family Medicine

## 2022-10-25 ENCOUNTER — Other Ambulatory Visit (HOSPITAL_COMMUNITY): Payer: Self-pay

## 2022-10-25 ENCOUNTER — Other Ambulatory Visit: Payer: Self-pay | Admitting: Family

## 2022-10-25 MED ORDER — ONDANSETRON HCL 4 MG PO TABS
4.0000 mg | ORAL_TABLET | Freq: Three times a day (TID) | ORAL | 0 refills | Status: DC | PRN
  Filled 2022-10-25: qty 20, 7d supply, fill #0

## 2022-11-06 ENCOUNTER — Other Ambulatory Visit (HOSPITAL_COMMUNITY): Payer: Self-pay

## 2022-11-06 DIAGNOSIS — H5213 Myopia, bilateral: Secondary | ICD-10-CM | POA: Diagnosis not present

## 2022-11-06 DIAGNOSIS — H52203 Unspecified astigmatism, bilateral: Secondary | ICD-10-CM | POA: Diagnosis not present

## 2022-11-22 ENCOUNTER — Other Ambulatory Visit (HOSPITAL_BASED_OUTPATIENT_CLINIC_OR_DEPARTMENT_OTHER): Payer: Self-pay

## 2022-11-22 ENCOUNTER — Other Ambulatory Visit: Payer: Self-pay | Admitting: Family Medicine

## 2022-11-22 NOTE — Telephone Encounter (Signed)
Pt getting sick on Ozempic '1mg'$  dose, she is requesting to drop back to 0.'5mg'$ . Please advise?

## 2022-11-23 DIAGNOSIS — L910 Hypertrophic scar: Secondary | ICD-10-CM | POA: Diagnosis not present

## 2022-11-24 ENCOUNTER — Other Ambulatory Visit (HOSPITAL_BASED_OUTPATIENT_CLINIC_OR_DEPARTMENT_OTHER): Payer: Self-pay

## 2022-11-24 ENCOUNTER — Encounter: Payer: Self-pay | Admitting: Family Medicine

## 2022-11-27 ENCOUNTER — Other Ambulatory Visit (HOSPITAL_COMMUNITY): Payer: Self-pay

## 2022-11-27 ENCOUNTER — Other Ambulatory Visit (HOSPITAL_BASED_OUTPATIENT_CLINIC_OR_DEPARTMENT_OTHER): Payer: Self-pay

## 2022-11-27 ENCOUNTER — Other Ambulatory Visit: Payer: Self-pay | Admitting: Family Medicine

## 2022-11-27 MED ORDER — OZEMPIC (0.25 OR 0.5 MG/DOSE) 2 MG/3ML ~~LOC~~ SOPN
0.2500 mg | PEN_INJECTOR | SUBCUTANEOUS | 2 refills | Status: DC
  Filled 2022-11-27: qty 9, 84d supply, fill #0

## 2022-11-28 ENCOUNTER — Other Ambulatory Visit (HOSPITAL_COMMUNITY): Payer: Self-pay

## 2022-12-01 ENCOUNTER — Other Ambulatory Visit (HOSPITAL_BASED_OUTPATIENT_CLINIC_OR_DEPARTMENT_OTHER): Payer: Self-pay

## 2022-12-02 DIAGNOSIS — S90451A Superficial foreign body, right great toe, initial encounter: Secondary | ICD-10-CM | POA: Diagnosis not present

## 2022-12-02 DIAGNOSIS — X58XXXA Exposure to other specified factors, initial encounter: Secondary | ICD-10-CM | POA: Diagnosis not present

## 2022-12-04 ENCOUNTER — Other Ambulatory Visit (HOSPITAL_COMMUNITY): Payer: Self-pay

## 2022-12-05 ENCOUNTER — Other Ambulatory Visit: Payer: Self-pay | Admitting: *Deleted

## 2022-12-05 ENCOUNTER — Other Ambulatory Visit (HOSPITAL_COMMUNITY): Payer: Self-pay

## 2022-12-05 MED ORDER — VALACYCLOVIR HCL 1 G PO TABS
1000.0000 mg | ORAL_TABLET | Freq: Every day | ORAL | 0 refills | Status: DC
  Filled 2022-12-05 – 2022-12-13 (×2): qty 30, 30d supply, fill #0

## 2022-12-13 ENCOUNTER — Other Ambulatory Visit (HOSPITAL_BASED_OUTPATIENT_CLINIC_OR_DEPARTMENT_OTHER): Payer: Self-pay

## 2022-12-13 ENCOUNTER — Other Ambulatory Visit (HOSPITAL_COMMUNITY): Payer: Self-pay | Admitting: Adult Health

## 2022-12-13 ENCOUNTER — Encounter (HOSPITAL_BASED_OUTPATIENT_CLINIC_OR_DEPARTMENT_OTHER): Payer: Self-pay | Admitting: Pharmacist

## 2022-12-13 ENCOUNTER — Encounter (HOSPITAL_COMMUNITY): Payer: Self-pay | Admitting: Internal Medicine

## 2022-12-13 ENCOUNTER — Other Ambulatory Visit (HOSPITAL_COMMUNITY): Payer: Self-pay

## 2022-12-13 MED ORDER — CARVEDILOL 3.125 MG PO TABS
3.1250 mg | ORAL_TABLET | Freq: Two times a day (BID) | ORAL | 5 refills | Status: DC
  Filled 2022-12-13: qty 60, 30d supply, fill #0

## 2022-12-14 ENCOUNTER — Other Ambulatory Visit (HOSPITAL_COMMUNITY): Payer: Self-pay

## 2022-12-14 ENCOUNTER — Telehealth: Payer: Self-pay

## 2022-12-14 MED ORDER — POTASSIUM CHLORIDE CRYS ER 20 MEQ PO TBCR
20.0000 meq | EXTENDED_RELEASE_TABLET | Freq: Every day | ORAL | 0 refills | Status: DC
  Filled 2022-12-14: qty 30, 30d supply, fill #0

## 2022-12-14 MED ORDER — HYDRALAZINE HCL 25 MG PO TABS
25.0000 mg | ORAL_TABLET | Freq: Three times a day (TID) | ORAL | 0 refills | Status: DC
  Filled 2022-12-14: qty 30, 10d supply, fill #0

## 2022-12-14 NOTE — Telephone Encounter (Signed)
PA initiated via Covermymeds; KEY: [id number]   The member recently filled this medication and will be able to return for their next refill according to their plan limits.

## 2022-12-25 ENCOUNTER — Other Ambulatory Visit (HOSPITAL_COMMUNITY): Payer: Self-pay

## 2022-12-29 ENCOUNTER — Other Ambulatory Visit (HOSPITAL_COMMUNITY): Payer: Self-pay

## 2023-01-19 DIAGNOSIS — L910 Hypertrophic scar: Secondary | ICD-10-CM | POA: Diagnosis not present

## 2023-02-27 NOTE — Progress Notes (Signed)
Advanced Heart Failure Clinic Note   PCP: Donato Schultz, DO Primary Cardiologist: Chilton Si, MD  HF Cardiologist: Dr. Gala Romney, MD  Reason for Visit: F/u for chronic systolic heart failure   HPI: [name] is a 42 y.o. female with a history of obesity, DM, HTN, asthma, OSA and severe systolic HF.  Echo (2010): EF 55-60%   Admitted 12/21 at [redacted] weeks pregnant, with first baby, with hypertension and lower extremity edema and found to have acute systolic heart failure. Echo completed and showed biventricular dysfunction with EF 15-20%. Co-Ox consistent with cardiogenic shock.  Diuresed over 50 pounds and GDMT initiated   Echo 02/09/22 EF 45%. Sleep study normal.   Today she returns for HF follow up. Overall feeling ok. Had URI end of April and had a cough and orthopnea, had been off meds x 2 months. Cough and orthopnea lingered and she restarted Lasix and Entresto and symptoms improved. She has only been taking Entresto and Lasix for the past couple weeks. She is not SOB walking on flat ground or lifting her toddler. Denies palpitations, CP, dizziness, edema, or PND/Orthopnea. Appetite ok. No fever or chills. 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   Cardiac Studies   - Echo (02/09/22): EF 45%   - Echo (2/22): EF 30% mild to mod RV dysfunction   - Echo (08/24/20): Biventricular HF EF < 20%   FH: Dad has A FIb, Aunt heart failure.  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  Note: Baby Girls' name is Clinical research associate   Past Medical History:  Diagnosis Date   Anemia    Asthma    Genital herpes    Gestational diabetes    Obesity    Pre-diabetes    Thyroid disease    Current Outpatient Medications  Medication Sig Dispense Refill   albuterol (PROAIR HFA) 108 (90 Base) MCG/ACT inhaler Inhale 2 puffs into the lungs every 6 (six) hours as needed. (Patient taking differently: Inhale 2 puffs into the lungs  every 6 (six) hours as needed for wheezing or shortness of breath.) 6.7 g 1   FLUoxetine (PROZAC) 20 MG capsule Take 1 capsule (20 mg total) by mouth daily. 90 capsule 1   furosemide (LASIX) 20 MG tablet Take 2 tablets (40 mg total) by mouth daily. 60 tablet 5   ondansetron (ZOFRAN) 4 MG tablet Take 1 tablet (4 mg total) by mouth every 8 (eight) hours as needed for nausea or vomiting. 20 tablet 0   potassium chloride SA (KLOR-CON M) 20 MEQ tablet Take 1 tablet (20 mEq total) by mouth daily. 30 tablet 0   sacubitril-valsartan (ENTRESTO) 97-103 MG Take 1 tablet by mouth 2 (two) times daily. 180 tablet 3   Semaglutide,0.25 or 0.5MG /DOS, (OZEMPIC, 0.25 OR 0.5 MG/DOSE,) 2 MG/3ML SOPN Inject 0.25 mg into the skin once a week. 3 mL 2   valACYclovir (VALTREX) 1000 MG tablet Take 1 tablet (1,000 mg total) by mouth daily. **need to schedule annual exam** 30 tablet 0   carvedilol (COREG) 3.125 MG tablet Take 1 tablet (3.125 mg total) by mouth 2 (two) times daily with a meal. (Patient not taking: Reported on 02/28/2023) 60 tablet 5   dapagliflozin propanediol (FARXIGA) 10 MG TABS tablet Take 1 tablet (10 mg total) by mouth daily before breakfast. (Patient not taking: Reported on 02/28/2023) 30 tablet 11   hydrALAZINE (APRESOLINE) 25 MG tablet Take 1 tablet (25  mg total) by mouth 3 (three) times daily. (Patient not taking: Reported on 02/28/2023) 30 tablet 0   isosorbide dinitrate (ISORDIL) 10 MG tablet Take 1 tablet (10 mg total) by mouth 3 (three) times daily. (Patient not taking: Reported on 02/28/2023) 90 tablet 3   ivabradine (CORLANOR) 5 MG TABS tablet Take 0.5 tablets (2.5 mg total) by mouth 2 (two) times daily with a meal. (Patient not taking: Reported on 02/28/2023) 60 tablet 3   spironolactone (ALDACTONE) 25 MG tablet Take 1 tablet (25 mg total) by mouth daily. (Patient not taking: Reported on 02/28/2023) 30 tablet 11   No current facility-administered medications for this encounter.   Allergies  Allergen  Reactions   Clomiphene Hives    Hives and lips swelling.    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    Employer: [employer]  Tobacco Use   Smoking status: Never    Passive exposure: Never   Smokeless tobacco: Never  Vaping Use   Vaping Use: Never used  Substance and Sexual Activity   Alcohol use: Yes    Comment: SOCIAL    Drug use: No   Sexual activity: Yes    Partners: Male    Birth control/protection: None    Comment: 1st intercourse- 16, partners- 10, current partner- 4 yrs   Other Topics Concern   Not on file  Social History Narrative   Not on file   Social Determinants of Health   Financial Resource Strain: Not on file  Food Insecurity: No Food Insecurity (06/04/2020)   Hunger Vital Sign    Worried About Running Out of Food in the Last Year: Never true    Ran Out of Food in the Last Year: Never true  Transportation Needs: Not on file  Physical Activity: Not on file  Stress: Not on file  Social Connections: Not on file  Intimate Partner Violence: Not on file     Family History  Problem Relation Age of Onset   Hyperlipidemia Mother    Hypertension Mother    Depression Father    Hyperlipidemia Father    Heart disease Father 81       MI   Hypertension Father    Cancer Father 47       prostate   Cancer Paternal Grandfather        prostate   Coronary artery disease Other    Prostate cancer Other    Cancer Maternal Grandfather        prostate   BP (!) 138/104   Pulse 93   Wt 128.8 kg (284 lb)   SpO2 99%   BMI 44.48 kg/m   Wt Readings from Last 3 Encounters:  02/28/23 128.8 kg (284 lb)  02/09/22 126.9 kg (279 lb 12.8 oz)  01/12/22 127.6 kg (281 lb 3.2 oz)   PHYSICAL EXAM: General:  NAD. No resp difficulty, walked into clinic HEENT:  Normal Neck: Supple. No JVD. Carotids 2+ bilat; no bruits. No lymphadenopathy or thryomegaly appreciated. Cor: PMI nondisplaced. Regular rate & rhythm. No rubs, gallops or murmurs. Lungs: Clear Abdomen: Soft, obese, nontender, nondistended. No hepatosplenomegaly. No bruits or masses. Good bowel sounds. Extremities: No cyanosis, clubbing, rash, edema Neuro: Alert & oriented x 3, cranial nerves grossly  intact. Moves all 4 extremities w/o difficulty. Affect pleasant.  ECG (personally reviewed): NSR 79 bpm, QTC 426 msec  ASSESSMENT & PLAN 1. Chronic Biventricular Heart Failure - Echo 12/21 severely reduced EF 15-20%.  - Possible HTN CM. - C-section 12/21 without complications.  - Repeat Echo (11/12/20): EF 30% mild to mod RV dysfunction  - Echo (02/09/22): EF 45% - NYHA II. Volume status ok, has been off most GDMT . - Restart Farxiga 10 mg daily. - Restart spironolactone 25 mg daily. - Continue Entresto 97/103 mg bid - Continue Lasix 40 mg daily. - Plan to add beta blocker and +/- BiDil next. - Dr. Gala Romney discussed risks of recurrent HF with subsequent pregnancies and possible risks of maternal/fetal death, need for transplant, etc at last visit. Have strongly advised need to avoid pregnancy. Continue IUD - CMET, BNP and CBC today, repeat BMET in 1 week. - Update echo to ensure EF has not dropped. We discussed importance of medication compliance.   2. HTN - Elevated today - Restart GDMT as above.   3. DM2 - Per PCP. - On Farxiga. Check A1C - No change   4. OSA - Repeat sleep study showed resolution of OSA  5. Obesity - Body mass index is 44.48 kg/m. - She has stopped her GLP1RA - Encouraged more activity  Follow up in 3 weeks with PharmD, 6 weeks with APP and 3-4 months with Dr. Gala Romney.  Anderson Malta Olar, FNP 02/28/23

## 2023-02-28 ENCOUNTER — Encounter (HOSPITAL_COMMUNITY): Payer: Self-pay

## 2023-02-28 ENCOUNTER — Ambulatory Visit (HOSPITAL_COMMUNITY)
Admission: RE | Admit: 2023-02-28 | Discharge: 2023-02-28 | Disposition: A | Discharge status: Home / Self Care | Payer: BC Managed Care – PPO | Source: Ambulatory Visit | Attending: Family Medicine | Admitting: Family Medicine

## 2023-02-28 ENCOUNTER — Other Ambulatory Visit (HOSPITAL_BASED_OUTPATIENT_CLINIC_OR_DEPARTMENT_OTHER): Payer: Self-pay

## 2023-02-28 VITALS — BP 138/104 | HR 93 | Wt 284.0 lb

## 2023-02-28 DIAGNOSIS — G4733 Obstructive sleep apnea (adult) (pediatric): Secondary | ICD-10-CM

## 2023-02-28 DIAGNOSIS — E669 Obesity, unspecified: Secondary | ICD-10-CM | POA: Diagnosis not present

## 2023-02-28 DIAGNOSIS — E119 Type 2 diabetes mellitus without complications: Secondary | ICD-10-CM

## 2023-02-28 DIAGNOSIS — I5082 Biventricular heart failure: Secondary | ICD-10-CM | POA: Diagnosis not present

## 2023-02-28 DIAGNOSIS — Z79899 Other long term (current) drug therapy: Secondary | ICD-10-CM | POA: Diagnosis not present

## 2023-02-28 DIAGNOSIS — Z6841 Body Mass Index (BMI) 40.0 and over, adult: Secondary | ICD-10-CM | POA: Diagnosis not present

## 2023-02-28 DIAGNOSIS — I5022 Chronic systolic (congestive) heart failure: Secondary | ICD-10-CM | POA: Diagnosis not present

## 2023-02-28 DIAGNOSIS — I10 Essential (primary) hypertension: Secondary | ICD-10-CM | POA: Diagnosis not present

## 2023-02-28 DIAGNOSIS — I110 Hypertensive heart disease with heart failure: Secondary | ICD-10-CM | POA: Insufficient documentation

## 2023-02-28 LAB — CBC
HCT: 40.1 % (ref 36.0–46.0)
Hemoglobin: 12.3 g/dL (ref 12.0–15.0)
MCH: 22.3 pg — ABNORMAL LOW (ref 26.0–34.0)
MCHC: 30.7 g/dL (ref 30.0–36.0)
MCV: 72.6 fL — ABNORMAL LOW (ref 80.0–100.0)
Platelets: 323 10*3/uL (ref 150–400)
RBC: 5.52 MIL/uL — ABNORMAL HIGH (ref 3.87–5.11)
RDW: 15.9 % — ABNORMAL HIGH (ref 11.5–15.5)
WBC: 15.1 10*3/uL — ABNORMAL HIGH (ref 4.0–10.5)
nRBC: 0 % (ref 0.0–0.2)

## 2023-02-28 LAB — HEMOGLOBIN A1C
Hgb A1c MFr Bld: 6.4 % — ABNORMAL HIGH (ref 4.8–5.6)
Mean Plasma Glucose: 136.98 mg/dL

## 2023-02-28 LAB — COMPREHENSIVE METABOLIC PANEL
ALT: 13 U/L (ref 0–44)
AST: 11 U/L — ABNORMAL LOW (ref 15–41)
Albumin: 3.3 g/dL — ABNORMAL LOW (ref 3.5–5.0)
Alkaline Phosphatase: 134 U/L — ABNORMAL HIGH (ref 38–126)
Anion gap: 8 (ref 5–15)
BUN: 7 mg/dL (ref 6–20)
CO2: 28 mmol/L (ref 22–32)
Calcium: 8.9 mg/dL (ref 8.9–10.3)
Chloride: 102 mmol/L (ref 98–111)
Creatinine, Ser: 0.74 mg/dL (ref 0.44–1.00)
GFR, Estimated: >60 mL/min (ref >60)
Glucose, Bld: 103 mg/dL — ABNORMAL HIGH (ref 70–99)
Potassium: 3.3 mmol/L — ABNORMAL LOW (ref 3.5–5.1)
Sodium: 138 mmol/L (ref 135–145)
Total Bilirubin: 0.8 mg/dL (ref 0.3–1.2)
Total Protein: 7.2 g/dL (ref 6.5–8.1)

## 2023-02-28 LAB — BRAIN NATRIURETIC PEPTIDE: B Natriuretic Peptide: 20.1 pg/mL (ref 0.0–100.0)

## 2023-02-28 MED ORDER — SPIRONOLACTONE 25 MG PO TABS
25.0000 mg | ORAL_TABLET | Freq: Every day | ORAL | 11 refills | Status: DC
  Filled 2023-02-28: qty 30, 30d supply, fill #0
  Filled 2023-04-18: qty 30, 30d supply, fill #1
  Filled 2023-07-02: qty 30, 30d supply, fill #2
  Filled 2023-08-13: qty 30, 30d supply, fill #3
  Filled 2023-10-25: qty 30, 30d supply, fill #4

## 2023-02-28 MED ORDER — DAPAGLIFLOZIN PROPANEDIOL 10 MG PO TABS
10.0000 mg | ORAL_TABLET | Freq: Every day | ORAL | 11 refills | Status: DC
  Filled 2023-02-28: qty 30, 30d supply, fill #0
  Filled 2023-04-18: qty 30, 30d supply, fill #1
  Filled 2023-07-02: qty 30, 30d supply, fill #2
  Filled 2023-08-13: qty 30, 30d supply, fill #3
  Filled 2023-10-25: qty 30, 30d supply, fill #4

## 2023-02-28 NOTE — Patient Instructions (Addendum)
Thank you for coming in today  If you had labs drawn today, any labs that are abnormal the clinic will call you No news is good news  Medications: RESTART Farxiga 10 mg daily  RESTART Spirinolactone 25 mg 1 tablet daily    Follow up appointments:  Your physician has requested that you have an echocardiogram. Echocardiography is a painless test that uses sound waves to create images of your heart. It provides your doctor with information about the size and shape of your heart and how well your heart's chambers and valves are working. This procedure takes approximately one hour. There are no restrictions for this procedure.     Your physician recommends that you return for lab work in: 1 week BMET  Your physician recommends that you schedule a follow-up appointment in:  3 weeks in pharmacy 6 weeks in clinic  3-4 months With Dr. Gala Romney You will receive a reminder letter in the mail a few months in advance. If you don't receive a letter, please call our office to schedule the follow-up appointment.     Do the following things EVERYDAY: Weigh yourself in the morning before breakfast. Write it down and keep it in a log. Take your medicines as prescribed Eat low salt foods--Limit salt (sodium) to 2000 mg per day.  Stay as active as you can everyday Limit all fluids for the day to less than 2 liters   At the Advanced [facility] Failure Clinic, you and your health needs are our priority. As part of our continuing mission to provide you with exceptional heart care, we have created designated Provider Care Teams. These Care Teams include your primary Cardiologist (physician) and Advanced Practice Providers (APPs- Physician Assistants and Nurse Practitioners) who all work together to provide you with the care you need, when you need it.   You may see any of the following providers on your designated Care Team at your next follow up: Dr Arvilla Meres Dr Marca Ancona Dr. Marcos Eke, NP Robbie Lis, [place] [facility] [place], [place] Brynda Peon, NP Karle Plumber, PharmD   Please be sure to bring in all your medications bottles to every appointment.    Thank you for choosing [facility]  If you have any questions or concerns before your next appointment please send [place] a message through [place] or call our office at [phone].    TO LEAVE A MESSAGE FOR THE NURSE SELECT OPTION 2, PLEASE LEAVE A MESSAGE INCLUDING: YOUR NAME DATE OF BIRTH CALL BACK NUMBER REASON FOR CALL**this is important as we prioritize the call backs  YOU WILL RECEIVE A CALL BACK THE SAME DAY AS LONG AS YOU CALL BEFORE 4:00 PM

## 2023-03-06 ENCOUNTER — Other Ambulatory Visit (HOSPITAL_COMMUNITY): Payer: Self-pay

## 2023-03-06 NOTE — Progress Notes (Signed)
Advanced Heart Failure Clinic Note   PCP: Donato Schultz, DO Primary Cardiologist: Chilton Si, MD  HF Cardiologist: Dr. Gala Romney, MD    HPI:  [name] is a 42 y.o. female with a history of obesity, DM, HTN, asthma, OSA and severe systolic HF.   Echo (2010): EF 55-60%   Admitted 08/2020 at [redacted] weeks pregnant, with first baby, with hypertension and lower extremity edema and found to have acute systolic heart failure. Echo completed and showed biventricular dysfunction with EF 15-20%. Co-Ox consistent with cardiogenic shock.  Diuresed over 50 pounds and GDMT initiated    Echo 02/09/22 EF 45%. Sleep study normal.    Returned for HF follow up 02/28/23. Overall was feeling ok. Had URI end of April and had a cough and orthopnea, had been off meds x 2 months. Cough and orthopnea lingered and she restarted Lasix and Entresto and symptoms improved. She had only been taking Entresto and Lasix for the past couple weeks. She was not SOB walking on flat ground or lifting her toddler. Denied palpitations, CP, dizziness, edema, or PND/Orthopnea. Appetite was ok. No fever or chills. 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    Today she returns to HF clinic for pharmacist medication titration. At last visit with APP, Farxiga and spironolactone were restarted. Overall she is feeling well today. No dizziness, lightheadedness, CP or palpitations. No SOB/DOE. Weight at home has been stable, 275-280 lbs. She takes Lasix 40 mg daily and has not needed any extra. No LEE, PND or orthopnea. Taking all medications as prescribed and tolerating all medications.    HF Medications: Entresto 97/103 mg BID Spironolactone 25 mg daily Farxiga 10 mg daily Lasix 40 mg daily  Has the patient been experiencing any side effects to the medications prescribed?  no  Does the patient have any problems obtaining medications due to transportation or finances?   No; Biomedical engineer. No issues currently affording medications. Provided her with [facility] copay card today in case she needs it.   Understanding of regimen: good Understanding of indications: good Potential of compliance: good Patient understands to avoid NSAIDs. Patient understands to avoid decongestants.    Pertinent Lab Values: 03/07/23: Serum creatinine 0.98, BUN 6, Potassium 3.4, Sodium 136 BMET today pending  Vital Signs: Weight: 280 lbs (last clinic weight: 284 lbs) Blood pressure: 130/74  Heart rate: 91   Assessment/Plan: 1. Chronic Biventricular Heart Failure - Echo 08/2020 severely reduced EF 15-20%.  - Possible HTN CM. - C-section 08/2020 without complications.  - Repeat Echo (11/12/20): EF 30% mild to mod RV dysfunction  - Echo (02/09/22): EF 45% - NYHA II. Volume status stable.  - BMET today pending - Continue Lasix 40 mg daily. - Start carvedilol 3.125 mg BID - Continue Entresto 97/103 mg BID - Continue spironolactone 25 mg daily. - Continue Farxiga 10 mg daily. - Dr. Gala Romney discussed risks of recurrent HF with subsequent pregnancies and possible risks of maternal/fetal death, need for transplant, etc at last visit. Have strongly advised need to avoid pregnancy. Continue IUD - Update echo to ensure EF has not dropped. Scheduled for 04/04/23. We discussed importance of medication compliance.    2. HTN - controlled at 130/74 today - Restart carvedilol as above.    3. DM2 - Per PCP. - On Farxiga. A1C 6.4% on 02/28/23   4. OSA - Repeat sleep study showed resolution of OSA   5. Obesity -  Body mass index is 44.48 kg/m. - She has stopped her GLP1RA but plans to restart soon.  - Encouraged more activity    Follow up 2 weeks with APP Clinic.   Karle Plumber, PharmD, BCPS, BCCP, CPP Heart Failure Clinic Pharmacist [phone]

## 2023-03-07 ENCOUNTER — Telehealth (HOSPITAL_COMMUNITY): Payer: Self-pay

## 2023-03-07 ENCOUNTER — Ambulatory Visit (HOSPITAL_COMMUNITY)
Admission: RE | Admit: 2023-03-07 | Discharge: 2023-03-07 | Disposition: A | Discharge status: Home / Self Care | Payer: BC Managed Care – PPO | Source: Ambulatory Visit | Attending: Internal Medicine | Admitting: Internal Medicine

## 2023-03-07 ENCOUNTER — Other Ambulatory Visit (HOSPITAL_COMMUNITY): Payer: Self-pay

## 2023-03-07 DIAGNOSIS — I5022 Chronic systolic (congestive) heart failure: Secondary | ICD-10-CM | POA: Insufficient documentation

## 2023-03-07 LAB — BASIC METABOLIC PANEL
Anion gap: 9 (ref 5–15)
BUN: 6 mg/dL (ref 6–20)
CO2: 24 mmol/L (ref 22–32)
Calcium: 9 mg/dL (ref 8.9–10.3)
Chloride: 103 mmol/L (ref 98–111)
Creatinine, Ser: 0.98 mg/dL (ref 0.44–1.00)
GFR, Estimated: >60 mL/min (ref >60)
Glucose, Bld: 126 mg/dL — ABNORMAL HIGH (ref 70–99)
Potassium: 3.4 mmol/L — ABNORMAL LOW (ref 3.5–5.1)
Sodium: 136 mmol/L (ref 135–145)

## 2023-03-07 MED ORDER — POTASSIUM CHLORIDE CRYS ER 20 MEQ PO TBCR
40.0000 meq | EXTENDED_RELEASE_TABLET | Freq: Every day | ORAL | 0 refills | Status: DC
  Filled 2023-03-07 – 2023-08-13 (×3): qty 30, 15d supply, fill #0

## 2023-03-07 NOTE — Telephone Encounter (Signed)
Pt aware, agreeable, and verbalized understanding 

## 2023-03-07 NOTE — Telephone Encounter (Signed)
-----   Message from Jacklynn Ganong, [place] sent at 03/07/2023  2:27 PM EDT ----- K remains low. Please call and confirm she is taking spiro 25 daily and KCL 20 daily.  If so, increase KCL to 40 daily and repeat BMET at follow up

## 2023-03-19 ENCOUNTER — Other Ambulatory Visit (HOSPITAL_COMMUNITY): Payer: Self-pay

## 2023-03-20 ENCOUNTER — Other Ambulatory Visit (HOSPITAL_BASED_OUTPATIENT_CLINIC_OR_DEPARTMENT_OTHER): Payer: Self-pay

## 2023-03-21 ENCOUNTER — Other Ambulatory Visit (HOSPITAL_BASED_OUTPATIENT_CLINIC_OR_DEPARTMENT_OTHER): Payer: Self-pay

## 2023-03-21 ENCOUNTER — Other Ambulatory Visit (HOSPITAL_COMMUNITY): Payer: Self-pay

## 2023-03-21 ENCOUNTER — Ambulatory Visit (HOSPITAL_COMMUNITY)
Admission: RE | Admit: 2023-03-21 | Discharge: 2023-03-21 | Disposition: A | Discharge status: Home / Self Care | Payer: BC Managed Care – PPO | Source: Ambulatory Visit | Attending: Cardiology | Admitting: Cardiology

## 2023-03-21 VITALS — BP 130/74 | HR 91 | Wt 280.0 lb

## 2023-03-21 DIAGNOSIS — G4733 Obstructive sleep apnea (adult) (pediatric): Secondary | ICD-10-CM | POA: Diagnosis not present

## 2023-03-21 DIAGNOSIS — J45909 Unspecified asthma, uncomplicated: Secondary | ICD-10-CM | POA: Insufficient documentation

## 2023-03-21 DIAGNOSIS — E669 Obesity, unspecified: Secondary | ICD-10-CM | POA: Diagnosis not present

## 2023-03-21 DIAGNOSIS — Z7984 Long term (current) use of oral hypoglycemic drugs: Secondary | ICD-10-CM | POA: Diagnosis not present

## 2023-03-21 DIAGNOSIS — I110 Hypertensive heart disease with heart failure: Secondary | ICD-10-CM | POA: Insufficient documentation

## 2023-03-21 DIAGNOSIS — I5022 Chronic systolic (congestive) heart failure: Secondary | ICD-10-CM | POA: Diagnosis not present

## 2023-03-21 DIAGNOSIS — E119 Type 2 diabetes mellitus without complications: Secondary | ICD-10-CM | POA: Insufficient documentation

## 2023-03-21 DIAGNOSIS — Z6841 Body Mass Index (BMI) 40.0 and over, adult: Secondary | ICD-10-CM | POA: Insufficient documentation

## 2023-03-21 DIAGNOSIS — Z79899 Other long term (current) drug therapy: Secondary | ICD-10-CM | POA: Diagnosis not present

## 2023-03-21 LAB — BASIC METABOLIC PANEL
Anion gap: 7 (ref 5–15)
BUN: 9 mg/dL (ref 6–20)
CO2: 26 mmol/L (ref 22–32)
Calcium: 8.9 mg/dL (ref 8.9–10.3)
Chloride: 101 mmol/L (ref 98–111)
Creatinine, Ser: 0.94 mg/dL (ref 0.44–1.00)
GFR, Estimated: >60 mL/min (ref >60)
Glucose, Bld: 120 mg/dL — ABNORMAL HIGH (ref 70–99)
Potassium: 3.4 mmol/L — ABNORMAL LOW (ref 3.5–5.1)
Sodium: 134 mmol/L — ABNORMAL LOW (ref 135–145)

## 2023-03-21 MED ORDER — CARVEDILOL 3.125 MG PO TABS
3.1250 mg | ORAL_TABLET | Freq: Two times a day (BID) | ORAL | 3 refills | Status: DC
  Filled 2023-03-21: qty 180, 90d supply, fill #0
  Filled 2023-07-02: qty 180, 90d supply, fill #1
  Filled 2023-10-25: qty 180, 90d supply, fill #2
  Filled 2024-03-11: qty 180, 90d supply, fill #3

## 2023-03-21 MED ORDER — ENTRESTO 97-103 MG PO TABS
1.0000 | ORAL_TABLET | Freq: Two times a day (BID) | ORAL | 3 refills | Status: DC
  Filled 2023-03-21: qty 180, 90d supply, fill #0
  Filled 2023-08-13: qty 180, 90d supply, fill #1

## 2023-03-21 MED ORDER — FUROSEMIDE 20 MG PO TABS
40.0000 mg | ORAL_TABLET | Freq: Every day | ORAL | 3 refills | Status: DC
  Filled 2023-03-26: qty 60, 30d supply, fill #0

## 2023-03-21 NOTE — Patient Instructions (Signed)
It was a pleasure seeing you today!  MEDICATIONS: -We are changing your medications today -Start carvedilol 3.125 mg (1 tablet) twice daily -Call if you have questions about your medications.  LABS: -We will call you if your labs need attention.  NEXT APPOINTMENT: Return to clinic in 2 weeks with APP Clinic.  In general, to take care of your heart failure: -Limit your fluid intake to 2 Liters (half-gallon) per day.   -Limit your salt intake to ideally 2-3 grams (2000-3000 mg) per day. -Weigh yourself daily and record, and bring that "weight diary" to your next appointment.  (Weight gain of 2-3 pounds in 1 day typically means fluid weight.) -The medications for your heart are to help your heart and help you live longer.   -Please contact us before stopping any of your heart medications.  Call the clinic at [phone] with questions or to reschedule future appointments.

## 2023-03-23 DIAGNOSIS — L910 Hypertrophic scar: Secondary | ICD-10-CM | POA: Diagnosis not present

## 2023-03-26 ENCOUNTER — Other Ambulatory Visit (HOSPITAL_BASED_OUTPATIENT_CLINIC_OR_DEPARTMENT_OTHER): Payer: Self-pay

## 2023-03-30 ENCOUNTER — Other Ambulatory Visit (HOSPITAL_BASED_OUTPATIENT_CLINIC_OR_DEPARTMENT_OTHER): Payer: Self-pay

## 2023-04-02 NOTE — Progress Notes (Signed)
Advanced Heart Failure Clinic Note   PCP: Donato Schultz, DO Primary Cardiologist: Chilton Si, MD  HF Cardiologist: Dr. Gala Romney, MD  Reason for Visit: F/u for chronic systolic heart failure   HPI: [name] is a 42 y.o. female with a history of obesity, DM, HTN, asthma, OSA and severe systolic HF.  Echo (2010): EF 55-60%   Admitted 12/21 at [redacted] weeks pregnant, with first baby, with hypertension and lower extremity edema and found to have acute systolic heart failure. Echo completed and showed biventricular dysfunction with EF 15-20%. Co-Ox consistent with cardiogenic shock.  Diuresed over 50 pounds and GDMT initiated   Echo 02/09/22 EF 45%. Sleep study normal.   Today she returns for HF follow up. Overall feeling ok. Had URI end of April and had a cough and orthopnea, had been off meds x 2 months. Cough and orthopnea lingered and she restarted Lasix and Entresto and symptoms improved. She has only been taking Entresto and Lasix for the past couple weeks. She is not SOB walking on flat ground or lifting her toddler. Denies palpitations, CP, dizziness, edema, or PND/Orthopnea. Appetite ok. No fever or chills. 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   Cardiac Studies   - Echo (02/09/22): EF 45%   - Echo (2/22): EF 30% mild to mod RV dysfunction   - Echo (08/24/20): Biventricular HF EF < 20%   FH: Dad has A FIb, Aunt heart failure.  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  Note: Baby Girls' name is Clinical research associate   Past Medical History:  Diagnosis Date   Anemia    Asthma    Genital herpes    Gestational diabetes    Obesity    Pre-diabetes    Thyroid disease    Current Outpatient Medications  Medication Sig Dispense Refill   albuterol (PROAIR HFA) 108 (90 Base) MCG/ACT inhaler Inhale 2 puffs into the lungs every 6 (six) hours as needed. (Patient taking differently: Inhale 2 puffs into the lungs  every 6 (six) hours as needed for wheezing or shortness of breath.) 6.7 g 1   carvedilol (COREG) 3.125 MG tablet Take 1 tablet (3.125 mg total) by mouth 2 (two) times daily. 180 tablet 3   dapagliflozin propanediol (FARXIGA) 10 MG TABS tablet Take 1 tablet (10 mg total) by mouth daily before breakfast. 30 tablet 11   FLUoxetine (PROZAC) 20 MG capsule Take 1 capsule (20 mg total) by mouth daily. 90 capsule 1   furosemide (LASIX) 20 MG tablet Take 2 tablets (40 mg total) by mouth daily. 180 tablet 3   potassium chloride SA (KLOR-CON M) 20 MEQ tablet Take 2 tablets (40 mEq total) by mouth daily. 30 tablet 0   sacubitril-valsartan (ENTRESTO) 97-103 MG Take 1 tablet by mouth 2 (two) times daily. 180 tablet 3   Semaglutide,0.25 or 0.5MG /DOS, (OZEMPIC, 0.25 OR 0.5 MG/DOSE,) 2 MG/3ML SOPN Inject 0.25 mg into the skin once a week. (Patient not taking: Reported on 03/21/2023) 3 mL 2   spironolactone (ALDACTONE) 25 MG tablet Take 1 tablet (25 mg total) by mouth daily. 30 tablet 11   valACYclovir (VALTREX) 1000 MG tablet Take 1 tablet (1,000 mg total) by mouth daily. **need to schedule annual exam** 30 tablet 0   No current facility-administered medications for this visit.   Allergies  Allergen Reactions   Clomiphene Hives    Hives and lips swelling.  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    Employer: [employer]  Tobacco Use   Smoking status: Never    Passive exposure: Never   Smokeless tobacco: Never  Vaping Use   Vaping status: Never Used  Substance and Sexual Activity   Alcohol use: Yes    Comment: SOCIAL    Drug use: No   Sexual activity: Yes    Partners: Male    Birth control/protection: None    Comment: 1st intercourse- 16, partners- 10, current partner- 4 yrs   Other  Topics Concern   Not on file  Social History Narrative   Not on file   Social Determinants of Health   Financial Resource Strain: Not on file  Food Insecurity: No Food Insecurity (06/04/2020)   Hunger Vital Sign    Worried About Running Out of Food in the Last Year: Never true    Ran Out of Food in the Last Year: Never true  Transportation Needs: Not on file  Physical Activity: Not on file  Stress: Not on file  Social Connections: Unknown (01/19/2022)   Received from [facility]   Social Network    Social Network: Not on file  Intimate Partner Violence: Unknown (01/02/2022)   Received from [facility]   HITS    Physically Hurt: Not on file    Insult or Talk Down To: Not on file    Threaten Physical Harm: Not on file    Scream or Curse: Not on file     Family History  Problem Relation Age of Onset   Hyperlipidemia Mother    Hypertension Mother    Depression Father    Hyperlipidemia Father    Heart disease Father 73       MI   Hypertension Father    Cancer Father 44       prostate   Cancer Paternal Grandfather        prostate   Coronary artery disease Other    Prostate cancer Other    Cancer Maternal Grandfather        prostate   There were no vitals taken for this visit.  Wt Readings from Last 3 Encounters:  03/21/23 127 kg (280 lb)  02/28/23 128.8 kg (284 lb)  02/09/22 126.9 kg (279 lb 12.8 oz)   PHYSICAL EXAM: General:  NAD. No resp difficulty, walked into clinic HEENT: Normal Neck: Supple. No JVD. Carotids 2+ bilat; no bruits. No lymphadenopathy or thryomegaly appreciated. Cor: PMI nondisplaced. Regular rate & rhythm. No rubs, gallops or murmurs. Lungs: Clear Abdomen: Soft, obese, nontender, nondistended. No hepatosplenomegaly. No bruits or masses. Good bowel sounds. Extremities: No cyanosis, clubbing, rash, edema Neuro: Alert & oriented x 3, cranial nerves grossly intact. Moves all 4 extremities w/o difficulty. Affect pleasant.  ECG (personally  reviewed): NSR 79 bpm, QTC 426 msec  ASSESSMENT & PLAN 1. Chronic Biventricular Heart Failure - Echo 12/21 severely reduced EF 15-20%.  - Possible HTN CM. - C-section 12/21 without complications.  - Repeat Echo (11/12/20): EF 30% mild to mod RV dysfunction  - Echo (02/09/22): EF 45% - NYHA II. Volume status ok, has been off most GDMT . - Restart Farxiga 10 mg daily. - Restart spironolactone 25 mg daily. -  Continue Entresto 97/103 mg bid - Continue Lasix 40 mg daily. - Plan to add beta blocker and +/- BiDil next. - Dr. Gala Romney discussed risks of recurrent HF with subsequent pregnancies and possible risks of maternal/fetal death, need for transplant, etc at last visit. Have strongly advised need to avoid pregnancy. Continue IUD - CMET, BNP and CBC today, repeat BMET in 1 week. - Update echo to ensure EF has not dropped. We discussed importance of medication compliance.   2. HTN - Elevated today - Restart GDMT as above.   3. DM2 - Per PCP. - On Farxiga. Check A1C - No change   4. OSA - Repeat sleep study showed resolution of OSA  5. Obesity - There is no height or weight on file to calculate BMI. - She has stopped her GLP1RA - Encouraged more activity  Follow up in 3 weeks with PharmD, 6 weeks with APP and 3-4 months with Dr. Gala Romney.  Kelli Malta [place], FNP 04/02/23

## 2023-04-04 ENCOUNTER — Ambulatory Visit (HOSPITAL_BASED_OUTPATIENT_CLINIC_OR_DEPARTMENT_OTHER)
Admission: RE | Admit: 2023-04-04 | Discharge: 2023-04-04 | Disposition: A | Discharge status: Home / Self Care | Payer: BC Managed Care – PPO | Source: Ambulatory Visit | Attending: Family Medicine | Admitting: Family Medicine

## 2023-04-04 ENCOUNTER — Ambulatory Visit (HOSPITAL_COMMUNITY)
Admission: RE | Admit: 2023-04-04 | Discharge: 2023-04-04 | Disposition: A | Discharge status: Home / Self Care | Payer: BC Managed Care – PPO | Source: Ambulatory Visit | Attending: Family Medicine | Admitting: Family Medicine

## 2023-04-04 ENCOUNTER — Other Ambulatory Visit (HOSPITAL_COMMUNITY): Payer: Self-pay

## 2023-04-04 ENCOUNTER — Other Ambulatory Visit (HOSPITAL_COMMUNITY): Payer: Self-pay | Admitting: Family Medicine

## 2023-04-04 ENCOUNTER — Encounter (HOSPITAL_COMMUNITY): Payer: Self-pay

## 2023-04-04 VITALS — BP 106/80 | HR 93 | Wt 283.8 lb

## 2023-04-04 DIAGNOSIS — I110 Hypertensive heart disease with heart failure: Secondary | ICD-10-CM | POA: Diagnosis not present

## 2023-04-04 DIAGNOSIS — I5022 Chronic systolic (congestive) heart failure: Secondary | ICD-10-CM | POA: Diagnosis not present

## 2023-04-04 DIAGNOSIS — G4733 Obstructive sleep apnea (adult) (pediatric): Secondary | ICD-10-CM

## 2023-04-04 DIAGNOSIS — I10 Essential (primary) hypertension: Secondary | ICD-10-CM

## 2023-04-04 DIAGNOSIS — J45909 Unspecified asthma, uncomplicated: Secondary | ICD-10-CM | POA: Diagnosis not present

## 2023-04-04 DIAGNOSIS — Z6841 Body Mass Index (BMI) 40.0 and over, adult: Secondary | ICD-10-CM | POA: Diagnosis not present

## 2023-04-04 DIAGNOSIS — I5041 Acute combined systolic (congestive) and diastolic (congestive) heart failure: Secondary | ICD-10-CM

## 2023-04-04 DIAGNOSIS — E119 Type 2 diabetes mellitus without complications: Secondary | ICD-10-CM | POA: Diagnosis not present

## 2023-04-04 DIAGNOSIS — I5082 Biventricular heart failure: Secondary | ICD-10-CM | POA: Insufficient documentation

## 2023-04-04 LAB — ECHOCARDIOGRAM COMPLETE
Area-P 1/2: 2.87 cm2
Calc EF: 34.8 %
S' Lateral: 4.5 cm
Single Plane A2C EF: 32.7 %
Single Plane A4C EF: 32.1 %

## 2023-04-04 LAB — BASIC METABOLIC PANEL
Anion gap: 6 (ref 5–15)
BUN: 7 mg/dL (ref 6–20)
CO2: 26 mmol/L (ref 22–32)
Calcium: 9.2 mg/dL (ref 8.9–10.3)
Chloride: 102 mmol/L (ref 98–111)
Creatinine, Ser: 0.82 mg/dL (ref 0.44–1.00)
GFR, Estimated: >60 mL/min (ref >60)
Glucose, Bld: 129 mg/dL — ABNORMAL HIGH (ref 70–99)
Potassium: 4 mmol/L (ref 3.5–5.1)
Sodium: 134 mmol/L — ABNORMAL LOW (ref 135–145)

## 2023-04-04 LAB — BRAIN NATRIURETIC PEPTIDE: B Natriuretic Peptide: 19.4 pg/mL (ref 0.0–100.0)

## 2023-04-04 MED ORDER — FUROSEMIDE 20 MG PO TABS
20.0000 mg | ORAL_TABLET | Freq: Every day | ORAL | 3 refills | Status: DC
  Filled 2023-04-04: qty 180, 180d supply, fill #0

## 2023-04-04 MED ORDER — FUROSEMIDE 20 MG PO TABS
20.0000 mg | ORAL_TABLET | Freq: Every day | ORAL | 3 refills | Status: AC
Start: 2023-04-04 — End: ?
  Filled 2023-04-04 – 2023-04-18 (×2): qty 90, 90d supply, fill #0
  Filled 2023-08-13: qty 90, 90d supply, fill #1
  Filled 2023-10-25: qty 90, 90d supply, fill #2
  Filled 2024-03-11: qty 90, 90d supply, fill #3

## 2023-04-04 MED ORDER — IVABRADINE HCL 5 MG PO TABS
2.5000 mg | ORAL_TABLET | Freq: Two times a day (BID) | ORAL | 11 refills | Status: DC
Start: 2023-04-04 — End: 2024-03-25
  Filled 2023-04-04 – 2023-04-18 (×2): qty 30, 30d supply, fill #0
  Filled 2023-07-02: qty 30, 30d supply, fill #1
  Filled 2023-08-13: qty 30, 30d supply, fill #2
  Filled 2023-10-25: qty 30, 30d supply, fill #3
  Filled 2024-03-11: qty 90, 90d supply, fill #4

## 2023-04-04 NOTE — Addendum Note (Signed)
Encounter addended by: Levonne Spiller, RN on: 04/04/2023 1:34 PM  Actions taken: Visit diagnoses modified, Order list changed, Diagnosis association updated

## 2023-04-04 NOTE — Patient Instructions (Signed)
Labs today - will call you if abnormal. Decrease your Lasix to 20 mg daily. Return to see Dr. Gala Romney in 3 months. Please call us at [phone] in September to schedule this appointment. Please call us at [phone] if any questions or concerns prior to your next appointment.

## 2023-04-04 NOTE — Progress Notes (Signed)
Echocardiogram 2D Echocardiogram has been performed.  Augustine Radar 04/04/2023, 8:59 AM

## 2023-04-17 ENCOUNTER — Other Ambulatory Visit (HOSPITAL_COMMUNITY): Payer: Self-pay

## 2023-04-18 ENCOUNTER — Other Ambulatory Visit (HOSPITAL_BASED_OUTPATIENT_CLINIC_OR_DEPARTMENT_OTHER): Payer: Self-pay

## 2023-04-18 ENCOUNTER — Other Ambulatory Visit: Payer: Self-pay

## 2023-06-01 ENCOUNTER — Other Ambulatory Visit: Payer: Self-pay

## 2023-06-01 ENCOUNTER — Ambulatory Visit: Payer: Federal, State, Local not specified - PPO | Admitting: Family Medicine

## 2023-06-01 ENCOUNTER — Encounter: Payer: Self-pay | Admitting: Family Medicine

## 2023-06-01 ENCOUNTER — Other Ambulatory Visit (HOSPITAL_COMMUNITY): Payer: Self-pay

## 2023-06-01 ENCOUNTER — Other Ambulatory Visit (HOSPITAL_BASED_OUTPATIENT_CLINIC_OR_DEPARTMENT_OTHER): Payer: Self-pay

## 2023-06-01 ENCOUNTER — Other Ambulatory Visit (HOSPITAL_COMMUNITY)
Admission: RE | Admit: 2023-06-01 | Discharge: 2023-06-01 | Disposition: A | Discharge status: Home / Self Care | Payer: Federal, State, Local not specified - PPO | Source: Ambulatory Visit | Attending: Family Medicine | Admitting: Family Medicine

## 2023-06-01 VITALS — BP 136/88 | HR 88 | Temp 98.6°F | Resp 18 | Ht 67.0 in | Wt 294.4 lb

## 2023-06-01 DIAGNOSIS — Z23 Encounter for immunization: Secondary | ICD-10-CM | POA: Diagnosis not present

## 2023-06-01 DIAGNOSIS — I10 Essential (primary) hypertension: Secondary | ICD-10-CM

## 2023-06-01 DIAGNOSIS — F32A Depression, unspecified: Secondary | ICD-10-CM

## 2023-06-01 DIAGNOSIS — R102 Pelvic and perineal pain: Secondary | ICD-10-CM

## 2023-06-01 DIAGNOSIS — I5042 Chronic combined systolic (congestive) and diastolic (congestive) heart failure: Secondary | ICD-10-CM

## 2023-06-01 DIAGNOSIS — A6000 Herpesviral infection of urogenital system, unspecified: Secondary | ICD-10-CM | POA: Diagnosis not present

## 2023-06-01 DIAGNOSIS — N814 Uterovaginal prolapse, unspecified: Secondary | ICD-10-CM | POA: Diagnosis not present

## 2023-06-01 DIAGNOSIS — E1165 Type 2 diabetes mellitus with hyperglycemia: Secondary | ICD-10-CM

## 2023-06-01 DIAGNOSIS — Z7985 Long-term (current) use of injectable non-insulin antidiabetic drugs: Secondary | ICD-10-CM

## 2023-06-01 DIAGNOSIS — N898 Other specified noninflammatory disorders of vagina: Secondary | ICD-10-CM | POA: Diagnosis not present

## 2023-06-01 DIAGNOSIS — F419 Anxiety disorder, unspecified: Secondary | ICD-10-CM

## 2023-06-01 DIAGNOSIS — F418 Other specified anxiety disorders: Secondary | ICD-10-CM

## 2023-06-01 LAB — CBC WITH DIFFERENTIAL/PLATELET
Basophils Absolute: 0.1 10*3/uL (ref 0.0–0.1)
Basophils Relative: 0.4 % (ref 0.0–3.0)
Eosinophils Absolute: 0.1 10*3/uL (ref 0.0–0.7)
Eosinophils Relative: 0.6 % (ref 0.0–5.0)
HCT: 41.4 % (ref 36.0–46.0)
Hemoglobin: 12.6 g/dL (ref 12.0–15.0)
Lymphocytes Relative: 16.3 % (ref 12.0–46.0)
Lymphs Abs: 2.6 10*3/uL (ref 0.7–4.0)
MCHC: 30.5 g/dL (ref 30.0–36.0)
MCV: 74.4 fl — ABNORMAL LOW (ref 78.0–100.0)
Monocytes Absolute: 0.9 10*3/uL (ref 0.1–1.0)
Monocytes Relative: 5.9 % (ref 3.0–12.0)
Neutro Abs: 12.2 10*3/uL — ABNORMAL HIGH (ref 1.4–7.7)
Neutrophils Relative %: 76.8 % (ref 43.0–77.0)
Platelets: 298 10*3/uL (ref 150.0–400.0)
RBC: 5.57 Mil/uL — ABNORMAL HIGH (ref 3.87–5.11)
RDW: 16.9 % — ABNORMAL HIGH (ref 11.5–15.5)
WBC: 15.9 10*3/uL — ABNORMAL HIGH (ref 4.0–10.5)

## 2023-06-01 LAB — LIPID PANEL
Cholesterol: 158 mg/dL (ref 0–200)
HDL: 49.1 mg/dL (ref >39.00)
LDL Cholesterol: 94 mg/dL (ref 0–99)
NonHDL: 109.27
Total CHOL/HDL Ratio: 3
Triglycerides: 77 mg/dL (ref 0.0–149.0)
VLDL: 15.4 mg/dL (ref 0.0–40.0)

## 2023-06-01 LAB — MICROALBUMIN / CREATININE URINE RATIO
Creatinine,U: 92.6 mg/dL
Microalb Creat Ratio: 0.8 mg/g (ref 0.0–30.0)
Microalb, Ur: <0.7 mg/dL (ref 0.0–1.9)

## 2023-06-01 LAB — COMPREHENSIVE METABOLIC PANEL
ALT: 10 U/L (ref 0–35)
AST: 9 U/L (ref 0–37)
Albumin: 3.6 g/dL (ref 3.5–5.2)
Alkaline Phosphatase: 141 U/L — ABNORMAL HIGH (ref 39–117)
BUN: 9 mg/dL (ref 6–23)
CO2: 29 meq/L (ref 19–32)
Calcium: 9.2 mg/dL (ref 8.4–10.5)
Chloride: 101 meq/L (ref 96–112)
Creatinine, Ser: 0.87 mg/dL (ref 0.40–1.20)
GFR: 82.37 mL/min (ref >60.00)
Glucose, Bld: 152 mg/dL — ABNORMAL HIGH (ref 70–99)
Potassium: 4 meq/L (ref 3.5–5.1)
Sodium: 138 meq/L (ref 135–145)
Total Bilirubin: 0.8 mg/dL (ref 0.2–1.2)
Total Protein: 7.1 g/dL (ref 6.0–8.3)

## 2023-06-01 LAB — HEMOGLOBIN A1C: Hgb A1c MFr Bld: 7.7 % — ABNORMAL HIGH (ref 4.6–6.5)

## 2023-06-01 LAB — TSH: TSH: 3.79 u[IU]/mL (ref 0.35–5.50)

## 2023-06-01 LAB — VITAMIN D 25 HYDROXY (VIT D DEFICIENCY, FRACTURES): VITD: 24.55 ng/mL — ABNORMAL LOW (ref 30.00–100.00)

## 2023-06-01 MED ORDER — ALPRAZOLAM 0.25 MG PO TABS
0.2500 mg | ORAL_TABLET | Freq: Three times a day (TID) | ORAL | 1 refills | Status: DC | PRN
Start: 2023-06-01 — End: 2023-12-24
  Filled 2023-06-01: qty 30, 10d supply, fill #0
  Filled 2023-10-25: qty 30, 10d supply, fill #1

## 2023-06-01 MED ORDER — VITAMIN D (ERGOCALCIFEROL) 1.25 MG (50000 UNIT) PO CAPS
50000.0000 [IU] | ORAL_CAPSULE | ORAL | 1 refills | Status: AC
  Filled 2023-06-01: qty 12, 84d supply, fill #0
  Filled 2023-10-25: qty 12, 84d supply, fill #1

## 2023-06-01 MED ORDER — VALACYCLOVIR HCL 1 G PO TABS
1000.0000 mg | ORAL_TABLET | Freq: Every day | ORAL | 0 refills | Status: DC
Start: 2023-06-01 — End: 2023-10-25
  Filled 2023-06-01: qty 30, 30d supply, fill #0

## 2023-06-01 MED ORDER — ALPRAZOLAM 0.25 MG PO TABS
0.2500 mg | ORAL_TABLET | Freq: Three times a day (TID) | ORAL | 1 refills | Status: DC | PRN
Start: 2023-06-01 — End: 2023-06-01

## 2023-06-01 MED ORDER — FLUOXETINE HCL 40 MG PO CAPS
40.0000 mg | ORAL_CAPSULE | Freq: Every day | ORAL | 3 refills | Status: DC
Start: 2023-06-01 — End: 2024-03-25
  Filled 2023-06-01: qty 90, 90d supply, fill #0
  Filled 2023-10-25: qty 90, 90d supply, fill #1

## 2023-06-01 MED ORDER — TIRZEPATIDE 5 MG/0.5ML ~~LOC~~ SOAJ
5.0000 mg | SUBCUTANEOUS | 1 refills | Status: DC
Start: 2023-06-01 — End: 2023-07-24
  Filled 2023-06-01: qty 2, 28d supply, fill #0
  Filled 2023-07-02: qty 2, 28d supply, fill #1

## 2023-06-01 NOTE — Progress Notes (Signed)
Established Patient Office Visit  Subjective   Patient ID: [name], female    DOB: 08/20/1981  Age: 42 y.o. MRN: [id number]  Chief Complaint  Patient presents with   Vaginal Discharge    Pt states sxs started Tuesday. Pt states having discharge and itching. Pt states on farxiga and states getting freq yeast infections   Referral    Pt thinks she has a uterus prolapse because she can feel her IUD string when she wipes.    HPI Discussed the use of AI scribe software for clinical note transcription with the patient, who gave verbal consent to proceed.  History of Present Illness   The patient, with a history of diabetes, presents with recurrent yeast infections, which she attributes to their [place]. She reports a thick, white discharge and is seeking a standing prescription for fluconazole to manage the frequent infections.  In addition to the yeast infections, the patient reports a prolapsed uterus, which she describes as "sitting in my lap." She expresses dissatisfaction with their previous OBGYN due to a past incident where she and her baby almost died, and she went into congestive heart failure. She is seeking a referral to a new OBGYN, Dr. Mindi Slicker at [facility].  The patient also reports exhaustion and requests a check of their vitamin D levels. She is currently on Prozac for depression and anxiety and requests a dosage increase. She also requests a refill of their Ozempic prescription and expresses a desire to switch to [facility] for their diabetes management. She is also on Valtrex for genital herpes.  Lastly, the patient reports back pain due to arthritis and is seeking an ergonomic chair and desk for their job at the [employer]. She mentions a past ER visit in 2022 for a bowel obstruction, during which an x-ray revealed the arthritis in her back.      Patient Active Problem List   Diagnosis Date Noted   Pelvic pain 06/01/2023   Prolapsed uterus 06/01/2023   Type 2 diabetes  mellitus with hyperglycemia, without long-term current use of insulin (HCC) 06/01/2023   Anxiety and depression 06/01/2023   CHF (congestive heart failure), NYHA class II, chronic, combined (HCC) 06/01/2023   Vaginal discharge 06/01/2023   Abdominal hernia 08/01/2021   CHF (congestive heart failure) (HCC) 08/01/2021   Small bowel obstruction (HCC) 07/31/2021   Cesarean delivery - 31 wks/cardiac dz 08/28/2020   Postpartum care following cesarean delivery 08/28/2020 08/28/2020   OSA (obstructive sleep apnea) 08/25/2020   Shortness of breath 08/24/2020   Pulmonary vascular congestion    Acute on chronic diastolic congestive heart failure (HCC)    Edema    Acute combined systolic and diastolic heart failure, NYHA class 4 (HCC)    Essential hypertension    White classification A2 gestational diabetes mellitus (GDM), insulin controlled 06/04/2020   B12 deficiency 05/28/2015   Vitamin D deficiency 05/28/2015   Secondary amenorrhea 08/28/2013   Genital herpes 07/19/2012   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, oral cavity, 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/20/2012   Cheloid 02/20/2012   THYROID NODULE 12/21/2009   WRIST PAIN, LEFT 12/14/2009   LOW BACK PAIN, ACUTE 10/12/2009   DYSURIA 10/12/2009   Goiter 07/23/2009   CARDIOMEGALY, MILD 07/13/2009   ELEVATED BP READING WITHOUT DX HYPERTENSION 09/21/2008   LEUKOCYTOSIS UNSPECIFIED 08/27/2008   UTI 02/24/2008   Morbid obesity (HCC) 03/11/2007   ANEMIA-IRON DEFICIENCY 11/05/2006   Asthma 11/05/2006   DISEASE, 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  Past Medical History:  Diagnosis Date   Anemia    Asthma    Genital herpes    Gestational diabetes    Obesity    Pre-diabetes    Thyroid disease    Past Surgical History:  Procedure Laterality Date   CESAREAN SECTION N/A 08/28/2020   Procedure: CESAREAN SECTION;  Surgeon: Olivia Mackie, MD;  Location: [facility] OR;  Service: Obstetrics;  Laterality: N/A;   DILATATION & CURETTAGE/HYSTEROSCOPY  WITH MYOSURE N/A 09/24/2018   Procedure: DILATATION & CURETTAGE/HYSTEROSCOPY WITH MYOSURE POLYPECTOMY;  Surgeon: [name], MD;  Location: [facility] ORS;  Service: Gynecology;  Laterality: N/A;   KELOID EXCISION  2008   removed @[facility] ----- 10/2011   KELOID EXCISION  09/20/2018   behind right ear   LAPAROTOMY N/A 08/04/2021   Procedure: EXPLORATORY LAPAROTOMY;  Surgeon: [name], MD;  Location: [facility] ORS;  Service: General;  Laterality: N/A;   SALIVARY GLAND SURGERY     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   VENTRAL HERNIA REPAIR N/A 08/04/2021   Procedure: DIAGNOSTIC LAPAROSCOPY; OPEN PRIMARY REPAIR INCARCERATED INCISIONAL HERNIA;  Surgeon: [name], MD;  Location: [facility] ORS;  Service: General;  Laterality: N/A;   Social History   Tobacco Use   Smoking status: Never    Passive exposure: Never   Smokeless tobacco: Never  Vaping Use   Vaping status: Never Used  Substance Use Topics   Alcohol use: Yes    Comment: SOCIAL    Drug use: No   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    Employer: [employer]  Tobacco Use   Smoking status: Never    Passive exposure: Never   Smokeless tobacco: Never  Vaping Use   Vaping status: Never Used  Substance and Sexual Activity   Alcohol use: Yes    Comment: SOCIAL    Drug use: No   Sexual activity: Yes    Partners: Male    Birth control/protection: None    Comment: 1st intercourse- 16, partners- 10, current partner- 4 yrs   Other Topics Concern   Not on file  Social History Narrative   Not on file   Social Determinants of Health   Financial Resource Strain: Not on file  Food Insecurity: No Food Insecurity (06/04/2020)   Hunger Vital Sign    Worried About Running Out of Food in the Last Year: Never true    Ran Out of  Food in the Last Year: Never true  Transportation Needs: Not on file  Physical Activity: Not on file  Stress: Not on file  Social Connections: Unknown (01/19/2022)   [facility] from [facility], [facility]   Social Network    Social Network: Not on file  Intimate Partner Violence: Unknown (01/02/2022)   Received from [facility], [facility]   HITS    Physically Hurt: Not on file    Insult or Talk Down To: Not on file    Threaten Physical Harm: Not on file    Scream or Curse: Not on file   Family Status  Relation Name Status   Mother  Alive   Father  Alive   PGM  Alive   PGF  Alive   Other  (Not Specified)   MGF  (Not Specified)  No partnership data on file   Family History  Problem Relation Age of Onset   Hyperlipidemia Mother    Hypertension Mother    Depression Father    Hyperlipidemia Father    Heart disease Father 43       MI   Hypertension Father    Cancer Father 4       prostate   Cancer Paternal Grandfather        prostate   Coronary artery disease Other    Prostate cancer Other    Cancer Maternal Grandfather        prostate   Allergies  Allergen Reactions   Clomiphene Hives    Hives and lips swelling.      Review of Systems  Constitutional:  Negative for chills, fever and malaise/fatigue.  HENT:  Negative for congestion and hearing loss.   Eyes:  Negative for blurred vision and discharge.  Respiratory:  Negative for cough, sputum production and shortness of breath.   Cardiovascular:  Negative for chest pain, palpitations and leg swelling.  Gastrointestinal:  Negative for abdominal pain, blood in stool, constipation, diarrhea, heartburn, nausea and vomiting.  Genitourinary:  Negative for dysuria, flank pain, frequency, hematuria and urgency.  Musculoskeletal:  Negative for back pain, falls and myalgias.  Skin:  Negative for rash.  Neurological:  Negative for dizziness, sensory change, loss of consciousness, weakness and headaches.   Endo/Heme/Allergies:  Negative for environmental allergies. Does not bruise/bleed easily.  Psychiatric/Behavioral:  Negative for depression and suicidal ideas. The patient is not nervous/anxious and does not have insomnia.       Objective:     BP 136/88 (BP Location: Right Arm, Patient Position: Sitting, Cuff Size: Large)   Pulse 88   Temp 98.6 F (37 C) (Oral)   Resp 18   Ht 5\' 7"  (1.702 m)   Wt 294 lb 6.4 oz (133.5 kg)   SpO2 98%   BMI 46.11 kg/m  BP Readings from Last 3 Encounters:  06/01/23 136/88  04/04/23 106/80  03/21/23 130/74   Wt Readings from Last 3 Encounters:  06/01/23 294 lb 6.4 oz (133.5 kg)  04/04/23 283 lb 12.8 oz (128.7 kg)  03/21/23 280 lb (127 kg)   SpO2 Readings from Last 3 Encounters:  06/01/23 98%  04/04/23 98%  02/28/23 99%      Physical Exam Vitals and nursing note reviewed.  Constitutional:      General: She is not in acute distress.    Appearance: Normal appearance. She is well-developed.  HENT:     Head: Normocephalic and atraumatic.  Eyes:     General: No scleral icterus.       Right eye: No discharge.        Left eye: No discharge.  Cardiovascular:     Rate and Rhythm: Normal rate and regular rhythm.     Heart sounds: No murmur heard. Pulmonary:     Effort: Pulmonary effort is normal. No respiratory distress.     Breath sounds: Normal breath sounds.  Musculoskeletal:        General: Normal range of motion.     Cervical back: Normal range of motion and neck supple.     Right lower leg: No edema.     Left lower leg: No edema.  Skin:    General: Skin is warm and dry.  Neurological:     Mental Status: She is alert and oriented to person, place, and time.  Psychiatric:  Mood and Affect: Mood normal.        Behavior: Behavior normal.        Thought Content: Thought content normal.        Judgment: Judgment normal.      No results found for any visits on 06/01/23.  Last CBC Lab Results  Component Value Date    WBC 15.1 (H) 02/28/2023   HGB 12.3 02/28/2023   HCT 40.1 02/28/2023   MCV 72.6 (L) 02/28/2023   MCH 22.3 (L) 02/28/2023   RDW 15.9 (H) 02/28/2023   PLT 323 02/28/2023   Last metabolic panel Lab Results  Component Value Date   GLUCOSE 129 (H) 04/04/2023   NA 134 (L) 04/04/2023   K 4.0 04/04/2023   CL 102 04/04/2023   CO2 26 04/04/2023   BUN 7 04/04/2023   CREATININE 0.82 04/04/2023   GFRNONAA >60 04/04/2023   CALCIUM 9.2 04/04/2023   PHOS 3.9 08/25/2020   PROT 7.2 02/28/2023   ALBUMIN 3.3 (L) 02/28/2023   BILITOT 0.8 02/28/2023   ALKPHOS 134 (H) 02/28/2023   AST 11 (L) 02/28/2023   ALT 13 02/28/2023   ANIONGAP 6 04/04/2023   Last lipids Lab Results  Component Value Date   CHOL 168 12/12/2021   HDL 42.50 12/12/2021   LDLCALC 107 (H) 12/12/2021   TRIG 91.0 12/12/2021   CHOLHDL 4 12/12/2021   Last hemoglobin A1c Lab Results  Component Value Date   HGBA1C 6.4 (H) 02/28/2023   Last thyroid functions Lab Results  Component Value Date   TSH 1.986 08/25/2020   T4TOTAL 6.5 05/28/2015   Last vitamin D Lab Results  Component Value Date   VD25OH 17 (L) 09/02/2019   Last vitamin B12 and Folate Lab Results  Component Value Date   VITAMINB12 325 05/28/2015   FOLATE 7.0 11/10/2010      The 10-year ASCVD risk score (Arnett DK, et al., 2019) is: 8.5%    Assessment & Plan:   Problem List Items Addressed This Visit       Unprioritized   Genital herpes   Relevant Medications   valACYclovir (VALTREX) 1000 MG tablet   Vaginal discharge    Self swab done Ua done      Relevant Orders   Cervicovaginal ancillary only( [place])   Type 2 diabetes mellitus with hyperglycemia, without long-term current use of insulin (HCC)   Relevant Medications   tirzepatide (MOUNJARO) 5 MG/0.5ML Pen   Other Relevant Orders   Comprehensive metabolic panel   Lipid panel   TSH   VITAMIN D 25 Hydroxy (Vit-D Deficiency, Fractures)   Hemoglobin A1c   Microalbumin / creatinine  urine ratio   Prolapsed uterus    Per pt Requesting gyn referral --- Dr Mindi Slicker      Pelvic pain - Primary    ? From prolapse uterus  Refer to gyn       Relevant Orders   Ambulatory referral to Obstetrics / Gynecology   Essential hypertension    Well controlled, no changes to meds. Encouraged heart healthy diet such as the DASH diet and exercise as tolerated.        CHF (congestive heart failure), NYHA class II, chronic, combined (HCC)    Per cardiology On entresto      Relevant Orders   CBC with Differential/Platelet   Comprehensive metabolic panel   Anxiety and depression   Relevant Medications   FLUoxetine (PROZAC) 40 MG capsule   ALPRAZolam (XANAX) 0.25 MG tablet  Other Visit Diagnoses     Need for influenza vaccination       Relevant Orders   Flu vaccine trivalent PF, 6mos and older(Flulaval,Afluria,Fluarix,Fluzone) (Completed)     Assessment and Plan    Recurrent Vulvovaginal Candidiasis Frequent yeast infections due to [place] use. Patient prefers oral treatment over topical. -Treat current infection as a complicated case. -Start Fluconazole 200mg  weekly for 6 months as suppressive therapy.  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 -Attempt to increase Ozempic to 0.5mg  to demonstrate inadequate response for prior authorization for [employer].  Uterine Prolapse Patient reports worsening symptoms, previous negative experience with current OBGYN. -Referral to Dr. [facility] at [facility].  Depression/Anxiety Patient on Prozac, wishes to increase dose due to increased symptoms. -Increase Prozac to 40mg  daily.  General Health Maintenance -Refill Xanax as needed for anxiety. -Check Vitamin D and Potassium levels due to patient's symptoms and medication side effects. -Administer influenza vaccine today. -Advise patient to get COVID-19 vaccine at pharmacy downstairs.         No follow-ups on file.    Donato Schultz, DO

## 2023-06-01 NOTE — Assessment & Plan Note (Signed)
Well controlled, no changes to meds. Encouraged heart healthy diet such as the DASH diet and exercise as tolerated.  °

## 2023-06-01 NOTE — Assessment & Plan Note (Signed)
?   From prolapse uterus  Refer to gyn

## 2023-06-01 NOTE — Assessment & Plan Note (Signed)
Per cardiology ?On entresto ?

## 2023-06-01 NOTE — Assessment & Plan Note (Signed)
Self swab done Ua done

## 2023-06-01 NOTE — Assessment & Plan Note (Signed)
Per pt Requesting gyn referral --- Dr Mindi Slicker

## 2023-06-04 ENCOUNTER — Other Ambulatory Visit (HOSPITAL_COMMUNITY): Payer: Self-pay

## 2023-06-04 ENCOUNTER — Other Ambulatory Visit: Payer: Self-pay

## 2023-06-04 LAB — CERVICOVAGINAL ANCILLARY ONLY
Bacterial Vaginitis (gardnerella): NEGATIVE
Candida Glabrata: NEGATIVE
Candida Vaginitis: POSITIVE — AB
Chlamydia: NEGATIVE
Comment: NEGATIVE
Comment: NEGATIVE
Comment: NEGATIVE
Comment: NEGATIVE
Comment: NEGATIVE
Comment: NORMAL
Neisseria Gonorrhea: NEGATIVE
Trichomonas: NEGATIVE

## 2023-06-04 MED ORDER — FLUCONAZOLE 200 MG PO TABS
200.0000 mg | ORAL_TABLET | ORAL | 2 refills | Status: DC
  Filled 2023-06-04: qty 12, 84d supply, fill #0
  Filled 2023-10-25: qty 12, 84d supply, fill #1
  Filled 2024-01-25 – 2024-01-26 (×2): qty 12, 84d supply, fill #2

## 2023-06-05 ENCOUNTER — Other Ambulatory Visit: Payer: Self-pay | Admitting: Family

## 2023-06-28 ENCOUNTER — Encounter: Payer: Self-pay | Admitting: Family Medicine

## 2023-07-02 ENCOUNTER — Other Ambulatory Visit (HOSPITAL_BASED_OUTPATIENT_CLINIC_OR_DEPARTMENT_OTHER): Payer: Self-pay

## 2023-07-03 ENCOUNTER — Other Ambulatory Visit (HOSPITAL_BASED_OUTPATIENT_CLINIC_OR_DEPARTMENT_OTHER): Payer: Self-pay

## 2023-07-04 ENCOUNTER — Other Ambulatory Visit: Payer: Self-pay

## 2023-07-04 ENCOUNTER — Other Ambulatory Visit (HOSPITAL_BASED_OUTPATIENT_CLINIC_OR_DEPARTMENT_OTHER): Payer: Self-pay

## 2023-07-05 ENCOUNTER — Other Ambulatory Visit: Payer: Self-pay

## 2023-07-05 ENCOUNTER — Other Ambulatory Visit (HOSPITAL_COMMUNITY): Payer: Self-pay

## 2023-07-05 ENCOUNTER — Telehealth (HOSPITAL_COMMUNITY): Payer: Self-pay

## 2023-07-05 ENCOUNTER — Other Ambulatory Visit (HOSPITAL_BASED_OUTPATIENT_CLINIC_OR_DEPARTMENT_OTHER): Payer: Self-pay

## 2023-07-05 NOTE — Telephone Encounter (Addendum)
Advanced Heart Failure Patient Advocate Encounter  Prior authorization for Corlanor (Ivabradine) has been submitted and approved. Test billing returns $22.50 for 90 day supply.  Key: [id number] Effective: 07/05/2023 to 07/04/2024  Claims have been reprocessed, and I have discussed the change to generic with the patient by phone.  Burnell Blanks, CPhT Rx Patient Advocate Phone: [phone]

## 2023-07-05 NOTE — Telephone Encounter (Signed)
Advanced Heart Failure Patient Advocate Encounter  Prior authorization for Marcelline Deist has been submitted and approved. Test billing returns $15 for 90 day supply.  Key: [id number] Effective: 07/05/2023 to 07/04/2024  Burnell Blanks, CPhT Rx Patient Advocate Phone: [phone]

## 2023-07-06 ENCOUNTER — Other Ambulatory Visit (HOSPITAL_BASED_OUTPATIENT_CLINIC_OR_DEPARTMENT_OTHER): Payer: Self-pay

## 2023-07-23 ENCOUNTER — Encounter: Payer: Self-pay | Admitting: Family Medicine

## 2023-07-24 ENCOUNTER — Other Ambulatory Visit (HOSPITAL_BASED_OUTPATIENT_CLINIC_OR_DEPARTMENT_OTHER): Payer: Self-pay

## 2023-07-24 MED ORDER — TIRZEPATIDE 7.5 MG/0.5ML ~~LOC~~ SOAJ
7.5000 mg | SUBCUTANEOUS | 0 refills | Status: DC
  Filled 2023-07-24: qty 2, 28d supply, fill #0

## 2023-08-04 ENCOUNTER — Other Ambulatory Visit (HOSPITAL_BASED_OUTPATIENT_CLINIC_OR_DEPARTMENT_OTHER): Payer: Self-pay

## 2023-08-04 DIAGNOSIS — R350 Frequency of micturition: Secondary | ICD-10-CM | POA: Diagnosis not present

## 2023-08-04 DIAGNOSIS — M545 Low back pain, unspecified: Secondary | ICD-10-CM | POA: Diagnosis not present

## 2023-08-04 DIAGNOSIS — M549 Dorsalgia, unspecified: Secondary | ICD-10-CM | POA: Diagnosis not present

## 2023-08-04 DIAGNOSIS — R311 Benign essential microscopic hematuria: Secondary | ICD-10-CM | POA: Diagnosis not present

## 2023-08-04 MED ORDER — METHOCARBAMOL 500 MG PO TABS
ORAL_TABLET | ORAL | 0 refills | Status: AC
  Filled 2023-08-04: qty 30, 10d supply, fill #0

## 2023-08-08 ENCOUNTER — Telehealth (HOSPITAL_COMMUNITY): Payer: Self-pay | Admitting: Pharmacy Technician

## 2023-08-08 ENCOUNTER — Other Ambulatory Visit (HOSPITAL_COMMUNITY): Payer: Self-pay

## 2023-08-08 NOTE — Telephone Encounter (Signed)
Advanced Heart Failure Patient Advocate Encounter  Received Corlanor renewal PA. Patients insurance currently covers the generic, Ivabradine. 30 day co-pay, $7.50. No PA needed at this time.  Archer Asa, CPhT

## 2023-08-13 NOTE — Progress Notes (Signed)
Advanced [facility] Failure Clinic Note   PCP: Donato Schultz, DO Primary Cardiologist: Chilton Si, MD  HF Cardiologist: Dr. Gala Romney, MD  Reason for Visit: F/u for chronic systolic heart failure   HPI: [name] is a 42 y.o. female with a history of obesity, DM, HTN, asthma, OSA and severe systolic HF.  Echo (2010): EF 55-60%   Admitted 12/21 at [redacted] weeks pregnant, with first baby, with hypertension and lower extremity edema and found to have acute systolic heart failure. Echo completed and showed biventricular dysfunction with EF 15-20%. Co-Ox consistent with cardiogenic shock.  Diuresed over 50 pounds and GDMT initiated   Echo 02/09/22 EF 45%. Sleep study normal.   Follow up 6/24, off most GDMT. NYHA II and volume OK. GDMT restarted, echo ordered to ensure EF stable.   Echo 7/24 EF 30-35%. Had not been consistently take medications. Plan to repeat echo when back on GDMT  Today she returns for HF follow up. Overall feeling fatigued. She has SOB walking further distances on flat ground. Has exertional CP after walking further distances, resolves with rest. + positional dizziness.  Denies palpitations, edema, or PND/Orthopnea. Appetite ok. No fever or chills. Weight at home 278 pounds. Taking one dose of meds consistently, frequently misses 2nd dose of meds. Busy working fulltime at [employer], and part-time as [employer] professor, stays busing caring for young daughter.  Echo today 08/20/23, EF appears 30-40%, reviewed with Dr. Elwyn Lade  Cardiac Studies - Echo (7/24): EF 30-35%   - Echo (02/09/22): EF 45%   - Echo (2/22): EF 30% mild to mod RV dysfunction   - Echo (08/24/20): Biventricular HF EF < 20%   FH: Dad has A FIb, Aunt heart failure.  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  Note: Baby Girls' name is Clinical research associate   Past Medical History:  Diagnosis Date   Anemia    Asthma    Genital herpes    Gestational diabetes    Obesity    Pre-diabetes     Thyroid disease    Current Outpatient Medications  Medication Sig Dispense Refill   albuterol (PROAIR HFA) 108 (90 Base) MCG/ACT inhaler Inhale 2 puffs into the lungs every 6 (six) hours as needed. (Patient taking differently: Inhale 2 puffs into the lungs every 6 (six) hours as needed for wheezing or shortness of breath.) 6.7 g 1   ALPRAZolam (XANAX) 0.25 MG tablet Take 1 tablet (0.25 mg total) by mouth 3 (three) times daily as needed for sleep. 30 tablet 1   carvedilol (COREG) 3.125 MG tablet Take 1 tablet (3.125 mg total) by mouth 2 (two) times daily. 180 tablet 3   dapagliflozin propanediol (FARXIGA) 10 MG TABS tablet Take 1 tablet (10 mg total) by mouth daily before breakfast. 30 tablet 11   fluconazole (DIFLUCAN) 200 MG tablet Take 1 tablet (200 mg total) by mouth once a week. 12 tablet 2   FLUoxetine (PROZAC) 40 MG capsule Take 1 capsule (40 mg total) by mouth daily. 90 capsule 3   furosemide (LASIX) 20 MG tablet Take 1 tablet (20 mg total) by mouth daily. 90 tablet 3   ivabradine (CORLANOR) 5 MG TABS tablet Take 0.5 tablets (2.5 mg total) by mouth 2 (two) times daily with a meal. 30 tablet 11   methocarbamol (ROBAXIN) 500 MG tablet Take 1 tablet (500 mg total) by mouth 3 (three) times a day as needed for muscle spasms. 30 tablet 0   potassium chloride SA (  KLOR-CON M) 20 MEQ tablet Take 2 tablets (40 mEq total) by mouth daily. 60 tablet 3   sacubitril-valsartan (ENTRESTO) 97-103 MG Take 1 tablet by mouth 2 (two) times daily. 180 tablet 3   spironolactone (ALDACTONE) 25 MG tablet Take 1 tablet (25 mg total) by mouth daily. 30 tablet 11   tirzepatide (MOUNJARO) 7.5 MG/0.5ML Pen Inject 7.5 mg into the skin once a week. 2 mL 0   valACYclovir (VALTREX) 1000 MG tablet Take 1 tablet (1,000 mg total) by mouth daily. **need to schedule annual exam** 30 tablet 0   Vitamin D, Ergocalciferol, (DRISDOL) 1.25 MG (50000 UNIT) CAPS capsule Take 1 capsule (50,000 Units total) by mouth every 7 (seven) days.  12 capsule 1   No current facility-administered medications for this encounter.   Allergies  Allergen Reactions   Clomiphene Hives    Hives and lips swelling.    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    Employer: [employer]  Tobacco Use   Smoking status: Never    Passive exposure: Never   Smokeless tobacco: Never  Vaping Use   Vaping status: Never Used  Substance and Sexual Activity   Alcohol use: Yes    Comment: SOCIAL    Drug use: No   Sexual activity: Yes    Partners: Male    Birth control/protection: None    Comment: 1st intercourse- 16, partners- 10, current partner- 4 yrs   Other Topics Concern   Not on file  Social History Narrative   Not on file   Social Determinants of Health   Financial Resource Strain: Not on file  Food Insecurity: No Food Insecurity (06/04/2020)   Hunger Vital Sign    Worried About Running Out of Food in the Last Year: Never true    Ran Out of Food in the Last Year: Never true  Transportation Needs: Not on file  Physical Activity: Not on file  Stress: Not on file  Social Connections: Unknown (01/19/2022)   Received [facility]   Social Network    Social Network: Not on file  Intimate Partner Violence: Unknown (01/02/2022)   Received from [facility], [facility]   HITS    Physically Hurt: Not on file    Insult or Talk Down To: Not on file    Threaten Physical Harm: Not on file    Scream or Curse: Not on file     Family History  Problem Relation Age of Onset   Hyperlipidemia Mother    Hypertension Mother    Depression Father    Hyperlipidemia Father    Heart disease Father 64       MI   Hypertension Father    Cancer Father 46       prostate   Cancer Paternal Grandfather        prostate    Coronary artery disease Other    Prostate cancer Other    Cancer Maternal Grandfather        prostate   BP 120/78   Pulse 82   Wt 127 kg (280 lb)   SpO2 99%   BMI 43.85 kg/m   Wt Readings from Last 3 Encounters:  08/20/23 127 kg (280 lb)  06/01/23  133.5 kg (294 lb 6.4 oz)  04/04/23 128.7 kg (283 lb 12.8 oz)   PHYSICAL EXAM: General:  NAD. No resp difficulty, walked into clinic HEENT: Normal Neck: Supple. No JVD. Carotids 2+ bilat; no bruits. No lymphadenopathy or thryomegaly appreciated. Cor: PMI nondisplaced. Regular rate & rhythm. No rubs, gallops or murmurs. Lungs: Clear Abdomen: Soft, obese, nontender, nondistended. No hepatosplenomegaly. No bruits or masses. Good bowel sounds. Extremities: No cyanosis, clubbing, rash, edema Neuro: Alert & oriented x 3, cranial nerves grossly intact. Moves all 4 extremities w/o difficulty. Affect pleasant.  ASSESSMENT & PLAN 1. Chronic Biventricular Heart Failure - Echo 12/21 severely reduced EF 15-20%.  - Possible HTN CM. - C-section 12/21 without complications.  - Repeat Echo (11/12/20): EF 30% mild to mod RV dysfunction  - Echo (02/09/22): EF 45% - Echo 7/24: EF 30-35% - Echo today 08/20/23, EF 30-40%. Awaiting official MD read - NYHA IIb. Volume status ok, though difficult with body habitus, GDMT limited by orthostasis - With positional dizziness, will decrease Entresto 49/51 mg bid - Continue Lasix 20 mg daily. - Continue Coreg 3.125 mg bid - Continue Farxiga 10 mg daily. - Continue spironolactone 25 mg daily. - Continue ivabradine 2.5 mg bid - Dr. Gala Romney discussed risks of recurrent HF with subsequent pregnancies and possible risks of maternal/fetal death, need for transplant, etc at last visit. Have strongly advised need to avoid pregnancy. Continue IUD - Labs today - Discussed referral to EP for ICD vs cMRI to further quantify EF. She is agreeable to ICD, but would like 3 more months of working to taking GDMT consistently  the repeat echo. I think this is reasonable. She understands risk  2. HTN - BP on soft side - Decrease Entresto as above   3. DM2 - Per PCP. - Hgb A1C 7.7 - On Farxiga. Had frequent yeast, but resolved. Will need to watch   4. OSA - Repeat sleep study showed resolution of OSA  5. Obesity - Body mass index is 43.85 kg/m. - She is now back on tirzepatide - Down 15 lbs, congratulated  Follow up in 3 months with Dr. Gala Romney with repeat echo. Will call her with official echo results.  She is asking about reasonable accommodations at work. I asked her to provide paperwork and we will complete.  Kelli [place], FNP 08/20/23   Greater than 50% of the (total minutes 30) visit spent in counseling/coordination of care regarding ( GDMT, echo results and referral for ICD)

## 2023-08-14 ENCOUNTER — Other Ambulatory Visit (HOSPITAL_COMMUNITY): Payer: Self-pay | Admitting: Family Medicine

## 2023-08-14 ENCOUNTER — Other Ambulatory Visit (HOSPITAL_BASED_OUTPATIENT_CLINIC_OR_DEPARTMENT_OTHER): Payer: Self-pay

## 2023-08-14 ENCOUNTER — Other Ambulatory Visit: Payer: Self-pay

## 2023-08-14 ENCOUNTER — Other Ambulatory Visit (HOSPITAL_COMMUNITY): Payer: Self-pay

## 2023-08-14 MED ORDER — POTASSIUM CHLORIDE CRYS ER 20 MEQ PO TBCR
40.0000 meq | EXTENDED_RELEASE_TABLET | Freq: Every day | ORAL | 3 refills | Status: DC
  Filled 2023-08-14: qty 60, 30d supply, fill #0

## 2023-08-20 ENCOUNTER — Encounter (HOSPITAL_COMMUNITY): Payer: Self-pay

## 2023-08-20 ENCOUNTER — Ambulatory Visit (HOSPITAL_BASED_OUTPATIENT_CLINIC_OR_DEPARTMENT_OTHER)
Admission: RE | Admit: 2023-08-20 | Discharge: 2023-08-20 | Disposition: A | Discharge status: Home / Self Care | Payer: Federal, State, Local not specified - PPO | Source: Ambulatory Visit | Attending: Family Medicine | Admitting: Family Medicine

## 2023-08-20 ENCOUNTER — Ambulatory Visit (HOSPITAL_COMMUNITY)
Admission: RE | Admit: 2023-08-20 | Discharge: 2023-08-20 | Disposition: A | Discharge status: Home / Self Care | Payer: Federal, State, Local not specified - PPO | Source: Ambulatory Visit | Attending: Family Medicine | Admitting: Family Medicine

## 2023-08-20 ENCOUNTER — Other Ambulatory Visit (HOSPITAL_BASED_OUTPATIENT_CLINIC_OR_DEPARTMENT_OTHER): Payer: Self-pay

## 2023-08-20 ENCOUNTER — Other Ambulatory Visit (HOSPITAL_COMMUNITY): Payer: Self-pay

## 2023-08-20 VITALS — BP 120/78 | HR 82 | Wt 280.0 lb

## 2023-08-20 DIAGNOSIS — G4733 Obstructive sleep apnea (adult) (pediatric): Secondary | ICD-10-CM | POA: Insufficient documentation

## 2023-08-20 DIAGNOSIS — I110 Hypertensive heart disease with heart failure: Secondary | ICD-10-CM | POA: Insufficient documentation

## 2023-08-20 DIAGNOSIS — Z006 Encounter for examination for normal comparison and control in clinical research program: Secondary | ICD-10-CM

## 2023-08-20 DIAGNOSIS — Z7985 Long-term (current) use of injectable non-insulin antidiabetic drugs: Secondary | ICD-10-CM | POA: Diagnosis not present

## 2023-08-20 DIAGNOSIS — I5041 Acute combined systolic (congestive) and diastolic (congestive) heart failure: Secondary | ICD-10-CM | POA: Diagnosis not present

## 2023-08-20 DIAGNOSIS — E669 Obesity, unspecified: Secondary | ICD-10-CM | POA: Insufficient documentation

## 2023-08-20 DIAGNOSIS — Z6841 Body Mass Index (BMI) 40.0 and over, adult: Secondary | ICD-10-CM | POA: Diagnosis not present

## 2023-08-20 DIAGNOSIS — I10 Essential (primary) hypertension: Secondary | ICD-10-CM | POA: Diagnosis not present

## 2023-08-20 DIAGNOSIS — E119 Type 2 diabetes mellitus without complications: Secondary | ICD-10-CM | POA: Diagnosis not present

## 2023-08-20 DIAGNOSIS — Z79899 Other long term (current) drug therapy: Secondary | ICD-10-CM | POA: Diagnosis not present

## 2023-08-20 DIAGNOSIS — I5022 Chronic systolic (congestive) heart failure: Secondary | ICD-10-CM | POA: Diagnosis not present

## 2023-08-20 DIAGNOSIS — I5082 Biventricular heart failure: Secondary | ICD-10-CM | POA: Insufficient documentation

## 2023-08-20 DIAGNOSIS — J45909 Unspecified asthma, uncomplicated: Secondary | ICD-10-CM | POA: Diagnosis not present

## 2023-08-20 LAB — ECHOCARDIOGRAM COMPLETE
AR max vel: 2.14 cm2
AV Area VTI: 2.13 cm2
AV Area mean vel: 2.11 cm2
AV Mean grad: 4 mm[Hg]
AV Peak grad: 7.2 mm[Hg]
Ao pk vel: 1.34 m/s
Area-P 1/2: 3.89 cm2
Calc EF: 39.7 %
Est EF: 40
S' Lateral: 4.1 cm
Single Plane A2C EF: 40.7 %
Single Plane A4C EF: 38.7 %

## 2023-08-20 LAB — BASIC METABOLIC PANEL
Anion gap: 8 (ref 5–15)
BUN: 6 mg/dL (ref 6–20)
CO2: 24 mmol/L (ref 22–32)
Calcium: 9.4 mg/dL (ref 8.9–10.3)
Chloride: 102 mmol/L (ref 98–111)
Creatinine, Ser: 1.01 mg/dL — ABNORMAL HIGH (ref 0.44–1.00)
GFR, Estimated: >60 mL/min (ref >60)
Glucose, Bld: 93 mg/dL (ref 70–99)
Potassium: 3.5 mmol/L (ref 3.5–5.1)
Sodium: 134 mmol/L — ABNORMAL LOW (ref 135–145)

## 2023-08-20 LAB — BRAIN NATRIURETIC PEPTIDE: B Natriuretic Peptide: 39.9 pg/mL (ref 0.0–100.0)

## 2023-08-20 MED ORDER — BLOOD PRESSURE MONITOR MISC
0 refills | Status: AC
  Filled 2023-08-20 – 2023-10-25 (×2): qty 1, 30d supply, fill #0

## 2023-08-20 MED ORDER — POTASSIUM CHLORIDE CRYS ER 20 MEQ PO TBCR
40.0000 meq | EXTENDED_RELEASE_TABLET | Freq: Every day | ORAL | 6 refills | Status: DC
  Filled 2023-08-20 – 2023-08-23 (×2): qty 60, 30d supply, fill #0
  Filled 2023-10-25: qty 60, 30d supply, fill #1
  Filled 2024-03-11: qty 180, 90d supply, fill #2

## 2023-08-20 MED ORDER — ENTRESTO 49-51 MG PO TABS
1.0000 | ORAL_TABLET | Freq: Two times a day (BID) | ORAL | 6 refills | Status: DC
  Filled 2023-08-20 – 2023-10-25 (×2): qty 60, 30d supply, fill #0

## 2023-08-20 NOTE — Research (Signed)
SITE: [id number]     Subject #[id number]    Subprotocol: A  Inclusion Criteria  Patients who meet all of the following criteria are eligible for enrollment as study participants:  Yes No  Age > 42 years old X   Eligible to wear Holter Study X    Exclusion Criteria  Patients who meet any of these criteria are not eligible for enrollment as study participants: Yes No  1. Receiving any mechanical (respiratory or circulatory) or renal support therapy at Screening or during Visit #1.  X  2.  Any other conditions that in the opinion of the investigators are likely to prevent compliance with the study protocol or pose a safety concern if the subject participates in the study.  X  3. Poor tolerance, namely susceptible to severe skin allergies from ECG adhesive patch application.  X   Protocol: REV H                                     Residential Zip [address] (First 3 digits ONLY)                                             [facility] Informed Consent   Subject Name: [name]  Subject met inclusion and exclusion criteria.  The informed consent form, study requirements and expectations were reviewed with the subject. Subject had opportunity to read consent and questions and concerns were addressed prior to the signing of the consent form.  The subject verbalized understanding of the trial requirements.  The subject agreed to participate in the [facility] EF ACT trial and signed the informed consent at 08:50 on 20-Aug-2023.  The informed consent was obtained prior to performance of any protocol-specific procedures for the subject.  A copy of the signed informed consent was given to the subject and a copy was placed in the subject's medical record.   Dyanne Iha

## 2023-08-20 NOTE — Patient Instructions (Addendum)
Thank you for coming in today  If you had labs drawn today, any labs that are abnormal the clinic will call you No news is good news  Medications: Decrease Entresto to 49/51 mg 1 tablet twice daily  You have been given a prescription for a blood pressure cuff sent to y our pharmacy  Follow up appointments:  Your physician recommends that you schedule a follow-up appointment in:  3 months echocardiogram With Dr. Gala Romney You will receive a reminder letter in the mail a few months in advance. If you don't receive a letter, please call our office to schedule the follow-up appointment.  Your physician has requested that you have an echocardiogram. Echocardiography is a painless test that uses sound waves to create images of your heart. It provides your doctor with information about the size and shape of your heart and how well your heart's chambers and valves are working. This procedure takes approximately one hour. There are no restrictions for this procedure.      Do the following things EVERYDAY: Weigh yourself in the morning before breakfast. Write it down and keep it in a log. Take your medicines as prescribed Eat low salt foods--Limit salt (sodium) to 2000 mg per day.  Stay as active as you can everyday Limit all fluids for the day to less than 2 liters   At the [facility], you and your health needs are our priority. As part of our continuing mission to provide you with exceptional heart care, we have created designated Provider Care Teams. These Care Teams include your primary Cardiologist (physician) and Advanced Practice Providers (APPs- Physician Assistants and Nurse Practitioners) who all work together to provide you with the care you need, when you need it.   You may see any of the following providers on your designated Care Team at your next follow up: Dr Arvilla Meres Dr Marca Ancona Dr. Marcos Eke, NP Robbie Lis, [place] [facility], [place] Brynda Peon, NP Karle Plumber, PharmD   Please be sure to bring in all your medications bottles to every appointment.    Thank you for choosing [facility]  If you have any questions or concerns before your next appointment please send [place] a message through [place] or call our office at [phone].    TO LEAVE A MESSAGE FOR THE NURSE SELECT OPTION 2, PLEASE LEAVE A MESSAGE INCLUDING: YOUR NAME DATE OF BIRTH CALL BACK NUMBER REASON FOR CALL**this is important as we prioritize the call backs  YOU WILL RECEIVE A CALL BACK THE SAME DAY AS LONG AS YOU CALL BEFORE 4:00 PM

## 2023-08-22 ENCOUNTER — Other Ambulatory Visit (HOSPITAL_BASED_OUTPATIENT_CLINIC_OR_DEPARTMENT_OTHER): Payer: Self-pay

## 2023-08-23 ENCOUNTER — Other Ambulatory Visit (HOSPITAL_COMMUNITY): Payer: Self-pay

## 2023-08-23 ENCOUNTER — Other Ambulatory Visit (HOSPITAL_BASED_OUTPATIENT_CLINIC_OR_DEPARTMENT_OTHER): Payer: Self-pay

## 2023-08-30 ENCOUNTER — Other Ambulatory Visit (HOSPITAL_COMMUNITY): Payer: Self-pay

## 2023-08-31 ENCOUNTER — Other Ambulatory Visit (HOSPITAL_BASED_OUTPATIENT_CLINIC_OR_DEPARTMENT_OTHER): Payer: Self-pay

## 2023-08-31 ENCOUNTER — Other Ambulatory Visit: Payer: Self-pay | Admitting: Family Medicine

## 2023-08-31 ENCOUNTER — Other Ambulatory Visit (HOSPITAL_COMMUNITY): Payer: Self-pay

## 2023-08-31 ENCOUNTER — Encounter: Payer: Self-pay | Admitting: Family Medicine

## 2023-08-31 MED ORDER — TIRZEPATIDE 7.5 MG/0.5ML ~~LOC~~ SOAJ
7.5000 mg | SUBCUTANEOUS | 0 refills | Status: DC
  Filled 2023-08-31 – 2023-09-01 (×2): qty 2, 28d supply, fill #0

## 2023-09-01 ENCOUNTER — Other Ambulatory Visit (HOSPITAL_BASED_OUTPATIENT_CLINIC_OR_DEPARTMENT_OTHER): Payer: Self-pay

## 2023-09-03 ENCOUNTER — Other Ambulatory Visit (HOSPITAL_COMMUNITY): Payer: Self-pay

## 2023-09-11 ENCOUNTER — Other Ambulatory Visit (HOSPITAL_COMMUNITY): Payer: Self-pay

## 2023-09-20 ENCOUNTER — Telehealth: Payer: Self-pay

## 2023-09-20 ENCOUNTER — Other Ambulatory Visit (HOSPITAL_COMMUNITY): Payer: Self-pay

## 2023-09-20 ENCOUNTER — Encounter (HOSPITAL_BASED_OUTPATIENT_CLINIC_OR_DEPARTMENT_OTHER): Payer: Self-pay

## 2023-09-20 ENCOUNTER — Other Ambulatory Visit (HOSPITAL_BASED_OUTPATIENT_CLINIC_OR_DEPARTMENT_OTHER): Payer: Self-pay

## 2023-09-20 ENCOUNTER — Encounter: Payer: Self-pay | Admitting: Family Medicine

## 2023-09-20 MED ORDER — TIRZEPATIDE 10 MG/0.5ML ~~LOC~~ SOAJ
10.0000 mg | SUBCUTANEOUS | 0 refills | Status: DC
  Filled 2023-09-20: qty 2, 28d supply, fill #0

## 2023-09-20 NOTE — Telephone Encounter (Signed)
 PA initiated via Covermymeds;KEY: [id number].   PA approved. The authorization is valid from 08/21/2023 through 09/19/2024

## 2023-09-20 NOTE — Telephone Encounter (Signed)
 Pharmacy Patient Advocate Encounter  Received notification from [facility] that Prior Authorization for Mounjaro 10 mg has been APPROVED from 09/20/23 to 09/19/24. Spoke to pharmacy to process.Copay is $55.52.    PA #/Case ID/Reference #: [id number]

## 2023-09-21 NOTE — Telephone Encounter (Signed)
 Forms and info sheets printed and placed in your office

## 2023-09-25 NOTE — Telephone Encounter (Signed)
 Pt states she doesn't have anything

## 2023-09-28 ENCOUNTER — Other Ambulatory Visit (HOSPITAL_BASED_OUTPATIENT_CLINIC_OR_DEPARTMENT_OTHER): Payer: Self-pay

## 2023-10-25 ENCOUNTER — Encounter: Payer: Self-pay | Admitting: Family Medicine

## 2023-10-25 ENCOUNTER — Other Ambulatory Visit: Payer: Self-pay | Admitting: Family Medicine

## 2023-10-25 ENCOUNTER — Other Ambulatory Visit (HOSPITAL_COMMUNITY): Payer: Self-pay

## 2023-10-25 DIAGNOSIS — E1165 Type 2 diabetes mellitus with hyperglycemia: Secondary | ICD-10-CM

## 2023-10-25 DIAGNOSIS — A6000 Herpesviral infection of urogenital system, unspecified: Secondary | ICD-10-CM

## 2023-10-25 MED ORDER — TIRZEPATIDE 12.5 MG/0.5ML ~~LOC~~ SOAJ
12.5000 mg | SUBCUTANEOUS | 2 refills | Status: DC
  Filled 2023-10-25 (×2): qty 6, 84d supply, fill #0
  Filled 2023-10-26: qty 2, 28d supply, fill #0

## 2023-10-26 ENCOUNTER — Other Ambulatory Visit (HOSPITAL_BASED_OUTPATIENT_CLINIC_OR_DEPARTMENT_OTHER): Payer: Self-pay

## 2023-10-26 ENCOUNTER — Other Ambulatory Visit: Payer: Self-pay

## 2023-10-26 MED ORDER — VALACYCLOVIR HCL 1 G PO TABS
1000.0000 mg | ORAL_TABLET | Freq: Every day | ORAL | 0 refills | Status: DC
  Filled 2023-10-26: qty 90, 90d supply, fill #0

## 2023-11-03 DIAGNOSIS — W504XXA Accidental scratch by another person, initial encounter: Secondary | ICD-10-CM | POA: Diagnosis not present

## 2023-11-03 DIAGNOSIS — S0502XA Injury of conjunctiva and corneal abrasion without foreign body, left eye, initial encounter: Secondary | ICD-10-CM | POA: Diagnosis not present

## 2023-11-14 DIAGNOSIS — E119 Type 2 diabetes mellitus without complications: Secondary | ICD-10-CM | POA: Diagnosis not present

## 2023-11-21 ENCOUNTER — Other Ambulatory Visit (HOSPITAL_COMMUNITY): Payer: Self-pay

## 2023-11-21 MED ORDER — TIRZEPATIDE 12.5 MG/0.5ML ~~LOC~~ SOAJ
12.5000 mg | SUBCUTANEOUS | 2 refills | Status: DC
  Filled 2023-11-21: qty 6, 84d supply, fill #0

## 2023-11-22 DIAGNOSIS — L910 Hypertrophic scar: Secondary | ICD-10-CM | POA: Diagnosis not present

## 2023-11-23 ENCOUNTER — Other Ambulatory Visit (HOSPITAL_COMMUNITY): Payer: Self-pay

## 2023-11-26 ENCOUNTER — Other Ambulatory Visit (HOSPITAL_BASED_OUTPATIENT_CLINIC_OR_DEPARTMENT_OTHER): Payer: Self-pay

## 2023-11-26 MED ORDER — TIRZEPATIDE 15 MG/0.5ML ~~LOC~~ SOAJ
15.0000 mg | SUBCUTANEOUS | 0 refills | Status: DC
  Filled 2023-11-26: qty 6, 84d supply, fill #0
  Filled 2023-11-28: qty 2, 28d supply, fill #0
  Filled 2023-12-24: qty 2, 28d supply, fill #1
  Filled 2024-01-25 – 2024-01-26 (×3): qty 2, 28d supply, fill #2

## 2023-11-26 NOTE — Addendum Note (Signed)
 Addended by: Roxanne Gates on: 11/26/2023 08:19 AM   Modules accepted: Orders

## 2023-11-28 ENCOUNTER — Other Ambulatory Visit (HOSPITAL_BASED_OUTPATIENT_CLINIC_OR_DEPARTMENT_OTHER): Payer: Self-pay

## 2023-11-30 NOTE — Addendum Note (Signed)
 Encounter addended by: Howell Rucks, RDCS on: 11/30/2023 12:47 PM  Actions taken: Imaging Exam ended

## 2023-12-21 DIAGNOSIS — L910 Hypertrophic scar: Secondary | ICD-10-CM | POA: Diagnosis not present

## 2023-12-24 ENCOUNTER — Other Ambulatory Visit (HOSPITAL_BASED_OUTPATIENT_CLINIC_OR_DEPARTMENT_OTHER): Payer: Self-pay

## 2023-12-24 ENCOUNTER — Other Ambulatory Visit: Payer: Self-pay

## 2023-12-24 ENCOUNTER — Encounter: Payer: Self-pay | Admitting: Family Medicine

## 2023-12-24 ENCOUNTER — Other Ambulatory Visit: Payer: Self-pay | Admitting: Family Medicine

## 2023-12-24 DIAGNOSIS — F419 Anxiety disorder, unspecified: Secondary | ICD-10-CM

## 2023-12-24 NOTE — Telephone Encounter (Signed)
 Requesting: Xanax 0.25 mg Contract: N/A UDS: N/A Last Visit: 06/01/2023 Next Visit: N/A Last Refill: 06/01/2023  Please Advise

## 2023-12-26 ENCOUNTER — Other Ambulatory Visit (HOSPITAL_BASED_OUTPATIENT_CLINIC_OR_DEPARTMENT_OTHER): Payer: Self-pay

## 2023-12-26 MED ORDER — ALPRAZOLAM 0.25 MG PO TABS
0.2500 mg | ORAL_TABLET | Freq: Three times a day (TID) | ORAL | 1 refills | Status: DC | PRN
  Filled 2023-12-26: qty 30, 10d supply, fill #0
  Filled 2024-01-25 – 2024-01-26 (×2): qty 30, 10d supply, fill #1

## 2023-12-27 ENCOUNTER — Other Ambulatory Visit (HOSPITAL_COMMUNITY): Payer: Self-pay

## 2024-01-03 ENCOUNTER — Other Ambulatory Visit (HOSPITAL_BASED_OUTPATIENT_CLINIC_OR_DEPARTMENT_OTHER): Payer: Self-pay

## 2024-01-03 MED ORDER — AZITHROMYCIN 250 MG PO TABS
ORAL_TABLET | ORAL | 0 refills | Status: DC
  Filled 2024-01-03: qty 6, 5d supply, fill #0

## 2024-01-25 ENCOUNTER — Other Ambulatory Visit (HOSPITAL_BASED_OUTPATIENT_CLINIC_OR_DEPARTMENT_OTHER): Payer: Self-pay

## 2024-01-26 ENCOUNTER — Other Ambulatory Visit (HOSPITAL_BASED_OUTPATIENT_CLINIC_OR_DEPARTMENT_OTHER): Payer: Self-pay

## 2024-01-30 ENCOUNTER — Ambulatory Visit (HOSPITAL_BASED_OUTPATIENT_CLINIC_OR_DEPARTMENT_OTHER): Admitting: Orthopaedic Surgery

## 2024-01-30 ENCOUNTER — Ambulatory Visit (HOSPITAL_BASED_OUTPATIENT_CLINIC_OR_DEPARTMENT_OTHER)

## 2024-01-30 DIAGNOSIS — M545 Low back pain, unspecified: Secondary | ICD-10-CM

## 2024-01-30 DIAGNOSIS — M438X6 Other specified deforming dorsopathies, lumbar region: Secondary | ICD-10-CM | POA: Diagnosis not present

## 2024-01-30 DIAGNOSIS — M549 Dorsalgia, unspecified: Secondary | ICD-10-CM | POA: Diagnosis not present

## 2024-01-30 DIAGNOSIS — G8929 Other chronic pain: Secondary | ICD-10-CM | POA: Diagnosis not present

## 2024-01-30 NOTE — Progress Notes (Signed)
 Chief Complaint: Back pain     History of Present Illness:    [name] is a 43 y.o. female presents today with ongoing lower back pain.  This has been ongoing for quite some time.  She states that she does occasionally experiencing the pain that radiates to the bilateral thighs.  She has not had any previous injections.  She has had therapy in the past.  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 and experiences pain with this    PMH/PSH/Family History/Social History/Meds/Allergies:    Past Medical History:  Diagnosis Date   Anemia    Asthma    Genital herpes    Gestational diabetes    Obesity    Pre-diabetes    Thyroid  disease    Past Surgical History:  Procedure Laterality Date   CESAREAN SECTION N/A 08/28/2020   Procedure: CESAREAN SECTION;  Surgeon: Meriam Stamp, MD;  Location: [facility] OR;  Service: Obstetrics;  Laterality: N/A;   DILATATION & CURETTAGE/HYSTEROSCOPY WITH MYOSURE N/A 09/24/2018   Procedure: DILATATION & CURETTAGE/HYSTEROSCOPY WITH MYOSURE POLYPECTOMY;  Surgeon: Lavoie, Marie-Lyne, MD;  Location: [facility] ORS;  Service: Gynecology;  Laterality: N/A;   KELOID EXCISION  2008   removed @[facility] ----- 10/2011   KELOID EXCISION  09/20/2018   behind right ear   LAPAROTOMY N/A 08/04/2021   Procedure: EXPLORATORY LAPAROTOMY;  Surgeon: Oralee Billow, MD;  Location: [facility] ORS;  Service: General;  Laterality: N/A;   SALIVARY GLAND SURGERY     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   VENTRAL HERNIA REPAIR N/A 08/04/2021   Procedure: DIAGNOSTIC LAPAROSCOPY; OPEN PRIMARY REPAIR INCARCERATED INCISIONAL HERNIA;  Surgeon: Oralee Billow, MD;  Location: [facility] ORS;  Service: General;  Laterality: N/A;   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     Employer: [employer]  Tobacco Use   Smoking status: Never    Passive exposure: Never   Smokeless tobacco: Never  Vaping Use   Vaping status: Never Used  Substance and Sexual Activity   Alcohol use: Yes    Comment: SOCIAL    Drug use: No   Sexual activity: Yes    Partners: Male    Birth control/protection: None    Comment: 1st intercourse- 16, partners- 10, current partner- 4 yrs   Other Topics Concern   Not on file  Social History Narrative   Not on file   Social Drivers of Health   Financial Resource Strain: Not on file  Food Insecurity: No Food Insecurity (06/04/2020)   Hunger Vital Sign    Worried About Running Out of Food in the Last Year: Never true    Ran Out of Food in the Last Year: Never true  Transportation Needs: Not on file  Physical Activity: Not on file  Stress: Not on file  Social Connections: Unknown (01/19/2022)   Received from [facility]   Social Network    Social Network: Not on file   Family History  Problem Relation Age of Onset   Hyperlipidemia Mother    Hypertension Mother    Depression Father    Hyperlipidemia Father    Heart disease Father 51       MI   Hypertension Father  Cancer Father 11       prostate   Cancer Paternal Grandfather        prostate   Coronary artery disease Other    Prostate cancer Other    Cancer Maternal Grandfather        prostate   Allergies  Allergen Reactions   Clomiphene  Hives    Hives and lips swelling.   Current Outpatient Medications  Medication Sig Dispense Refill   albuterol  (PROAIR  HFA) 108 (90 Base) MCG/ACT inhaler Inhale 2 puffs into the lungs every 6 (six) hours as needed. (Patient taking differently: Inhale 2 puffs into the lungs every 6 (six) hours as needed for wheezing or shortness of breath.) 6.7 g 1   ALPRAZolam  (XANAX ) 0.25 MG tablet Take 1 tablet (0.25 mg total) by mouth 3 (three) times daily as needed for sleep. 30 tablet 1   azithromycin  (ZITHROMAX ) 250 MG tablet Take 2  tablets by mouth daily on day 1, then 1 tablet daily on days 2-5. 6 tablet 0   Blood Pressure Monitor MISC Use as directed to monitor blood pressure. 1 each 0   carvedilol  (COREG ) 3.125 MG tablet Take 1 tablet (3.125 mg total) by mouth 2 (two) times daily. 180 tablet 3   dapagliflozin  propanediol (FARXIGA ) 10 MG TABS tablet Take 1 tablet (10 mg total) by mouth daily before breakfast. 30 tablet 11   fluconazole  (DIFLUCAN ) 200 MG tablet Take 1 tablet (200 mg total) by mouth once a week. 12 tablet 2   FLUoxetine  (PROZAC ) 40 MG capsule Take 1 capsule (40 mg total) by mouth daily. 90 capsule 3   furosemide  (LASIX ) 20 MG tablet Take 1 tablet (20 mg total) by mouth daily. 90 tablet 3   ivabradine  (CORLANOR ) 5 MG TABS tablet Take 0.5 tablets (2.5 mg total) by mouth 2 (two) times daily with a meal. 30 tablet 11   methocarbamol  (ROBAXIN ) 500 MG tablet Take 1 tablet (500 mg total) by mouth 3 (three) times a day as needed for muscle spasms. 30 tablet 0   potassium chloride  SA (KLOR-CON  M) 20 MEQ tablet Take 2 tablets (40 mEq total) by mouth daily. 60 tablet 6   sacubitril -valsartan  (ENTRESTO ) 49-51 MG Take 1 tablet by mouth 2 (two) times daily. 60 tablet 6   spironolactone  (ALDACTONE ) 25 MG tablet Take 1 tablet (25 mg total) by mouth daily. 30 tablet 11   tirzepatide  (MOUNJARO ) 15 MG/0.5ML Pen Inject 15 mg into the skin once a week. 6 mL 0   valACYclovir  (VALTREX ) 1000 MG tablet Take 1 tablet (1,000 mg total) by mouth daily. 90 tablet 0   Vitamin D , Ergocalciferol , (DRISDOL ) 1.25 MG (50000 UNIT) CAPS capsule Take 1 capsule (50,000 Units total) by mouth every 7 (seven) days. 12 capsule 1   No current facility-administered medications for this visit.   DG Lumbar Spine Complete Result Date: 01/30/2024 CLINICAL DATA:  Back pain. EXAM: LUMBAR SPINE - COMPLETE 4+ VIEW COMPARISON:  None Available. FINDINGS: There is no evidence of lumbar spine fracture. Minimal curvature of spine. Intervertebral disc spaces are  maintained. IMPRESSION: No acute fracture or dislocation. Minimal curvature of spine. Electronically Signed   By: Anna Barnes M.D.   On: 01/30/2024 15:53    Review of Systems:   A ROS was performed including pertinent positives and negatives as documented in the HPI.  Physical Exam :   Constitutional: NAD and appears stated age Neurological: Alert and oriented Psych: Appropriate affect and cooperative There were no vitals taken for this  visit.   Comprehensive Musculoskeletal Exam:    Lower back pain with negative straight leg raise bilaterally   Imaging:   Xray (views lumbar spine): L5-S1 evidence of spondylolisthesis and narrowing of the foraminal space    I personally reviewed and interpreted the radiographs.   Assessment and Plan:   43 y.o. female with lumbar degenerative disc disease consistent with ongoing back pain.  At today's visit I did recommend initial physical therapy in order to get her some strengthening of her core.  We did discuss that we can perform an epidural injection as needed.  I will plan to see her back as needed   I personally saw and evaluated the patient, and participated in the management and treatment plan.  Wilhelmenia Harada, MD Attending Physician, Orthopedic Surgery  This document was dictated using Dragon voice recognition software. A reasonable attempt at proof reading has been made to minimize errors.

## 2024-02-14 DIAGNOSIS — L910 Hypertrophic scar: Secondary | ICD-10-CM | POA: Diagnosis not present

## 2024-02-15 ENCOUNTER — Other Ambulatory Visit (HOSPITAL_BASED_OUTPATIENT_CLINIC_OR_DEPARTMENT_OTHER): Payer: Self-pay

## 2024-02-15 ENCOUNTER — Other Ambulatory Visit: Payer: Self-pay | Admitting: Family Medicine

## 2024-02-15 MED ORDER — MOUNJARO 15 MG/0.5ML ~~LOC~~ SOAJ
15.0000 mg | SUBCUTANEOUS | 0 refills | Status: DC
  Filled 2024-02-15: qty 6, 84d supply, fill #0
  Filled 2024-02-23: qty 2, 28d supply, fill #0

## 2024-02-23 ENCOUNTER — Other Ambulatory Visit (HOSPITAL_BASED_OUTPATIENT_CLINIC_OR_DEPARTMENT_OTHER): Payer: Self-pay

## 2024-03-11 ENCOUNTER — Other Ambulatory Visit (HOSPITAL_COMMUNITY): Payer: Self-pay | Admitting: Family Medicine

## 2024-03-12 ENCOUNTER — Other Ambulatory Visit: Payer: Self-pay

## 2024-03-13 ENCOUNTER — Ambulatory Visit: Admitting: Family Medicine

## 2024-03-13 ENCOUNTER — Encounter: Payer: Self-pay | Admitting: Family Medicine

## 2024-03-13 ENCOUNTER — Other Ambulatory Visit (HOSPITAL_BASED_OUTPATIENT_CLINIC_OR_DEPARTMENT_OTHER): Payer: Self-pay

## 2024-03-13 VITALS — BP 124/80 | HR 81 | Temp 97.8°F | Resp 18 | Ht 67.0 in | Wt 254.2 lb

## 2024-03-13 DIAGNOSIS — M545 Low back pain, unspecified: Secondary | ICD-10-CM

## 2024-03-13 DIAGNOSIS — F32A Depression, unspecified: Secondary | ICD-10-CM

## 2024-03-13 DIAGNOSIS — A6000 Herpesviral infection of urogenital system, unspecified: Secondary | ICD-10-CM

## 2024-03-13 DIAGNOSIS — E1165 Type 2 diabetes mellitus with hyperglycemia: Secondary | ICD-10-CM | POA: Diagnosis not present

## 2024-03-13 DIAGNOSIS — Z7985 Long-term (current) use of injectable non-insulin antidiabetic drugs: Secondary | ICD-10-CM

## 2024-03-13 DIAGNOSIS — F418 Other specified anxiety disorders: Secondary | ICD-10-CM

## 2024-03-13 DIAGNOSIS — F419 Anxiety disorder, unspecified: Secondary | ICD-10-CM | POA: Diagnosis not present

## 2024-03-13 MED ORDER — FLUOXETINE HCL 20 MG PO TABS
60.0000 mg | ORAL_TABLET | Freq: Every day | ORAL | 3 refills | Status: AC
  Filled 2024-03-13: qty 90, 30d supply, fill #0

## 2024-03-13 MED ORDER — CYCLOBENZAPRINE HCL 10 MG PO TABS
10.0000 mg | ORAL_TABLET | Freq: Three times a day (TID) | ORAL | 0 refills | Status: AC | PRN
  Filled 2024-03-13: qty 30, 10d supply, fill #0

## 2024-03-13 MED ORDER — VALACYCLOVIR HCL 1 G PO TABS
1000.0000 mg | ORAL_TABLET | Freq: Every day | ORAL | 0 refills | Status: AC
  Filled 2024-03-13: qty 90, 90d supply, fill #0

## 2024-03-13 MED ORDER — MOUNJARO 15 MG/0.5ML ~~LOC~~ SOAJ
15.0000 mg | SUBCUTANEOUS | 0 refills | Status: DC
  Filled 2024-03-13: qty 6, 84d supply, fill #0
  Filled 2024-03-17: qty 2, 28d supply, fill #0
  Filled 2024-04-19 (×2): qty 2, 28d supply, fill #1
  Filled 2024-05-21: qty 2, 28d supply, fill #2

## 2024-03-13 MED ORDER — ALPRAZOLAM 0.25 MG PO TABS
0.2500 mg | ORAL_TABLET | Freq: Three times a day (TID) | ORAL | 1 refills | Status: AC | PRN
  Filled 2024-03-13: qty 30, 10d supply, fill #0
  Filled 2024-05-21: qty 30, 10d supply, fill #1

## 2024-03-13 NOTE — Assessment & Plan Note (Signed)
 [id number] to be checked minimize simple carbs. Increase exercise as tolerated. Continue current meds

## 2024-03-13 NOTE — Progress Notes (Signed)
 Established Patient Office Visit  Subjective   Patient ID: [name], female    DOB: 1980/10/15  Age: 43 y.o. MRN: [id number]  Chief Complaint  Patient presents with   Diabetes   Follow-up    HPI Discussed the use of AI scribe software for clinical note transcription with the patient, who gave verbal consent to proceed.  History of Present Illness [name] is a 43 year old female who presents for medication refills and evaluation of a leg lesion.  She has been on a weight loss medication for a year, resulting in a 44-pound weight loss from 294 pounds in September to 260 pounds currently. She feels her weight loss is slower compared to others on the same medication and attributes this to insufficient protein intake. She has started consuming protein drinks to address this.  She is currently taking Xanax  and Prozac  for stress and insomnia, with her current Prozac  dose at 40 mg. She experiences difficulty shutting her brain off at night due to job-related stress, as she works at the [employer] and is concerned about job security. She requests medication refills, including Xanax , Prozac , Mounjaro , Flexeril , and Valtrex .  She mentions a longstanding spot on her leg that has been present for years. The middle of the lesion has become hard and painful, leading her to cut it out herself, resulting in a white hard scab.  In her family history, her father recently passed away and her mother, who is 69 years old, lives nearby. Her father had dementia, and she finds it difficult to watch him 'disappear'. Her mother is managing well despite missing her father, as she now has more freedom to engage in activities.   Patient Active Problem List   Diagnosis Date Noted   Pelvic pain 06/01/2023   Prolapsed uterus 06/01/2023   Type 2 diabetes mellitus with hyperglycemia, without long-term current use of insulin  (HCC) 06/01/2023   Anxiety and depression 06/01/2023   CHF  (congestive heart failure), NYHA class II, chronic, combined (HCC) 06/01/2023   Vaginal discharge 06/01/2023   Abdominal hernia 08/01/2021   CHF (congestive heart failure) (HCC) 08/01/2021   Small bowel obstruction (HCC) 07/31/2021   Cesarean delivery - 31 wks/cardiac dz 08/28/2020   Postpartum care following cesarean delivery 08/28/2020 08/28/2020   OSA (obstructive sleep apnea) 08/25/2020   Shortness of breath 08/24/2020   Pulmonary vascular congestion    Acute on chronic diastolic congestive heart failure (HCC)    Edema    Acute combined systolic and diastolic heart failure, NYHA class 4 (HCC)    Essential hypertension    White classification A2 gestational diabetes mellitus (GDM), insulin  controlled 06/04/2020   B12 deficiency 05/28/2015   Vitamin D  deficiency 05/28/2015   Secondary amenorrhea 08/28/2013   Genital herpes 07/19/2012   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, oral cavity, 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/20/2012   Cheloid 02/20/2012   THYROID  NODULE 12/21/2009   WRIST PAIN, LEFT 12/14/2009   LOW BACK PAIN, ACUTE 10/12/2009   DYSURIA 10/12/2009   Goiter 07/23/2009   CARDIOMEGALY, MILD 07/13/2009   ELEVATED BP READING WITHOUT DX HYPERTENSION 09/21/2008   LEUKOCYTOSIS UNSPECIFIED 08/27/2008   UTI 02/24/2008   Morbid obesity (HCC) 03/11/2007   ANEMIA-IRON  DEFICIENCY 11/05/2006   Asthma 11/05/2006   DISEASE, 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   Past Medical History:  Diagnosis Date   Anemia    Asthma    Genital herpes    Gestational diabetes    Obesity  Pre-diabetes    Thyroid  disease    Past Surgical History:  Procedure Laterality Date   CESAREAN SECTION N/A 08/28/2020   Procedure: CESAREAN SECTION;  Surgeon: Gorge Ade, MD;  Location: [facility];  Service: Obstetrics;  Laterality: N/A;   DILATATION & CURETTAGE/HYSTEROSCOPY WITH MYOSURE N/A 09/24/2018   Procedure: DILATATION & CURETTAGE/HYSTEROSCOPY WITH MYOSURE POLYPECTOMY;  Surgeon: Lavoie, Marie-Lyne,  MD;  Location: [facility];  Service: Gynecology;  Laterality: N/A;   KELOID EXCISION  2008   removed @[facility] ----- 10/2011   KELOID EXCISION  09/20/2018   behind right ear   LAPAROTOMY N/A 08/04/2021   Procedure: EXPLORATORY LAPAROTOMY;  Surgeon: Eletha Boas, MD;  Location: [facility];  Service: General;  Laterality: N/A;   SALIVARY GLAND SURGERY     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   VENTRAL HERNIA REPAIR N/A 08/04/2021   Procedure: DIAGNOSTIC LAPAROSCOPY; OPEN PRIMARY REPAIR INCARCERATED INCISIONAL HERNIA;  Surgeon: Eletha Boas, MD;  Location: [facility];  Service: General;  Laterality: N/A;   Social History   Tobacco Use   Smoking status: Never    Passive exposure: Never   Smokeless tobacco: Never  Vaping Use   Vaping status: Never Used  Substance Use Topics   Alcohol use: Yes    Comment: SOCIAL    Drug use: No   Social History   Socioeconomic History   Marital status: Married    Spouse name: Not on file   Number of children: Not on file   Years of education: Not on file   Highest education level: Master's degree (e.g., MA, MS, MEng, MEd, MSW, MBA)  Occupational History   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    Employer: [employer]  Tobacco Use   Smoking status: Never    Passive exposure: Never   Smokeless tobacco: Never  Vaping Use   Vaping status: Never Used  Substance and Sexual Activity   Alcohol use: Yes    Comment: SOCIAL    Drug use: No   Sexual activity: Yes    Partners: Male    Birth control/protection: None    Comment: 1st intercourse- 16, partners- 10, current partner- 4 yrs   Other Topics Concern   Not on file  Social History Narrative   Not on file   Social Drivers of Health   Financial Resource Strain: Low Risk  (03/12/2024)   Overall Financial Resource Strain (CARDIA)    Difficulty of Paying Living Expenses: Not hard at all  Food Insecurity: No Food Insecurity (03/12/2024)   Hunger Vital Sign    Worried About Running Out of Food in the Last Year: Never  true    Ran Out of Food in the Last Year: Never true  Transportation Needs: No Transportation Needs (03/12/2024)   PRAPARE - Administrator, Civil Service (Medical): No    Lack of Transportation (Non-Medical): No  Physical Activity: Insufficiently Active (03/12/2024)   Exercise Vital Sign    Days of Exercise per Week: 1 day    Minutes of Exercise per Session: 10 min  Stress: Stress Concern Present (03/12/2024)   [employer] of Occupational Health - Occupational Stress Questionnaire    Feeling of Stress: Very much  Social Connections: Socially Integrated (03/12/2024)   Social Connection and Isolation Panel    Frequency of Communication with Friends and Family: More than three times a week    Frequency of Social Gatherings with Friends and Family: Twice a week    Attends Religious Services: More than 4 times per year  Active Member of Clubs or Organizations: Yes    Attends Banker Meetings: More than 4 times per year    Marital Status: Married  Intimate Partner Violence: Unknown (01/02/2022)   Received from [facility]   HITS    Physically Hurt: Not on file    Insult or Talk Down To: Not on file    Threaten Physical Harm: Not on file    Scream or Curse: Not on file   Family Status  Relation Name Status   Mother  Alive   Father  Alive   PGM  Alive   PGF  Alive   Other  (Not Specified)   MGF  (Not Specified)  No partnership data on file   Family History  Problem Relation Age of Onset   Hyperlipidemia Mother    Hypertension Mother    Depression Father    Hyperlipidemia Father    Heart disease Father 100       MI   Hypertension Father    Cancer Father 89       prostate   Cancer Paternal Grandfather        prostate   Coronary artery disease Other    Prostate cancer Other    Cancer Maternal Grandfather        prostate   Allergies  Allergen Reactions   Clomiphene  Hives    Hives and lips swelling.      Review of Systems   Constitutional:  Negative for chills, fever and malaise/fatigue.  HENT:  Negative for congestion and hearing loss.   Eyes:  Negative for blurred vision and discharge.  Respiratory:  Negative for cough, sputum production and shortness of breath.   Cardiovascular:  Negative for chest pain, palpitations and leg swelling.  Gastrointestinal:  Negative for abdominal pain, blood in stool, constipation, diarrhea, heartburn, nausea and vomiting.  Genitourinary:  Negative for dysuria, frequency, hematuria and urgency.  Musculoskeletal:  Negative for back pain, falls and myalgias.  Skin:  Negative for rash.  Neurological:  Negative for dizziness, sensory change, loss of consciousness, weakness and headaches.  Endo/Heme/Allergies:  Negative for environmental allergies. Does not bruise/bleed easily.  Psychiatric/Behavioral:  Negative for depression and suicidal ideas. The patient is not nervous/anxious and does not have insomnia.       Objective:     BP 124/80 (BP Location: Right Arm, Patient Position: Sitting, Cuff Size: Large)   Pulse 81   Temp 97.8 F (36.6 C) (Oral)   Resp 18   Ht 5' 7 (1.702 m)   Wt 254 lb 3.2 oz (115.3 kg)   SpO2 98%   BMI 39.81 kg/m  BP Readings from Last 3 Encounters:  03/13/24 124/80  08/20/23 120/78  06/01/23 136/88   Wt Readings from Last 3 Encounters:  03/13/24 254 lb 3.2 oz (115.3 kg)  08/20/23 280 lb (127 kg)  06/01/23 294 lb 6.4 oz (133.5 kg)   SpO2 Readings from Last 3 Encounters:  03/13/24 98%  08/20/23 99%  06/01/23 98%      Physical Exam Vitals and nursing note reviewed.  Constitutional:      General: She is not in acute distress.    Appearance: Normal appearance. She is well-developed.  HENT:     Head: Normocephalic and atraumatic.   Eyes:     General: No scleral icterus.       Right eye: No discharge.        Left eye: No discharge.    Cardiovascular:  Rate and Rhythm: Normal rate and regular rhythm.     Heart sounds: No  murmur heard. Pulmonary:     Effort: Pulmonary effort is normal. No respiratory distress.     Breath sounds: Normal breath sounds.   Musculoskeletal:        General: Normal range of motion.     Cervical back: Normal range of motion and neck supple.     Right lower leg: No edema.     Left lower leg: No edema.   Skin:    General: Skin is warm and dry.   Neurological:     Mental Status: She is alert and oriented to person, place, and time.   Psychiatric:        Mood and Affect: Mood normal.        Behavior: Behavior normal.        Thought Content: Thought content normal.        Judgment: Judgment normal.      No results found for any visits on 03/13/24.  Last CBC Lab Results  Component Value Date   WBC 15.9 (H) 06/01/2023   HGB 12.6 06/01/2023   HCT 41.4 06/01/2023   MCV 74.4 (L) 06/01/2023   MCH 22.3 (L) 02/28/2023   RDW 16.9 (H) 06/01/2023   PLT 298.0 06/01/2023   Last metabolic panel Lab Results  Component Value Date   GLUCOSE 93 08/20/2023   NA 134 (L) 08/20/2023   K 3.5 08/20/2023   CL 102 08/20/2023   CO2 24 08/20/2023   BUN 6 08/20/2023   CREATININE 1.01 (H) 08/20/2023   GFRNONAA >60 08/20/2023   CALCIUM  9.4 08/20/2023   PHOS 3.9 08/25/2020   PROT 7.1 06/01/2023   ALBUMIN 3.6 06/01/2023   BILITOT 0.8 06/01/2023   ALKPHOS 141 (H) 06/01/2023   AST 9 06/01/2023   ALT 10 06/01/2023   ANIONGAP 8 08/20/2023   Last lipids Lab Results  Component Value Date   CHOL 158 06/01/2023   HDL 49.10 06/01/2023   LDLCALC 94 06/01/2023   TRIG 77.0 06/01/2023   CHOLHDL 3 06/01/2023   Last hemoglobin A1c Lab Results  Component Value Date   HGBA1C 7.7 (H) 06/01/2023   Last thyroid  functions Lab Results  Component Value Date   TSH 3.79 06/01/2023   T4TOTAL 6.5 05/28/2015   Last vitamin D  Lab Results  Component Value Date   VD25OH 24.55 (L) 06/01/2023   Last vitamin B12 and Folate Lab Results  Component Value Date   VITAMINB12 325 05/28/2015    FOLATE 7.0 11/10/2010      The 10-year ASCVD risk score (Arnett DK, et al., 2019) is: 3.7%    Assessment & Plan:   Problem List Items Addressed This Visit       Unprioritized   LOW BACK PAIN, ACUTE   Relevant Medications   cyclobenzaprine  (FLEXERIL ) 10 MG tablet   Genital herpes   Relevant Medications   tirzepatide  (MOUNJARO ) 15 MG/0.5ML Pen   valACYclovir  (VALTREX ) 1000 MG tablet   Anxiety and depression   Relevant Medications   FLUoxetine  (PROZAC ) 20 MG tablet   ALPRAZolam  (XANAX ) 0.25 MG tablet   Type 2 diabetes mellitus with hyperglycemia, without long-term current use of insulin  (HCC)   hgba1c to be checked minimize simple carbs. Increase exercise as tolerated. Continue current meds       Relevant Medications   tirzepatide  (MOUNJARO ) 15 MG/0.5ML Pen   Other Relevant Orders   Lipid panel   CBC with Differential/Platelet  Comprehensive metabolic panel with GFR   Hemoglobin A1c   Microalbumin / creatinine urine ratio   Other Visit Diagnoses       Depression with anxiety    -  Primary   Relevant Medications   FLUoxetine  (PROZAC ) 20 MG tablet   ALPRAZolam  (XANAX ) 0.25 MG tablet     Assessment and Plan Assessment & Plan Obesity   She has been on Mounjaro  for weight management, resulting in a weight loss of 44 pounds since September, from 294 to 250 pounds. Concerned about not losing as much weight as others on the medication, she suspects insufficient protein intake and has started taking protein drinks. Weight loss varies among individuals, and her progress is positive. Continue Mounjaro  at the current dose and encourage increased protein intake through protein drinks.  Anxiety and Stress   She reports increased stress and difficulty sleeping due to racing thoughts related to job insecurity at the [employer]. Currently taking Xanax  and Prozac  (40 mg), increasing Prozac  to 60 mg is considered to provide relief without excessive sedation. Increase Prozac  to 60 mg daily,  continue Xanax  as prescribed, and discuss job-related stress with consideration for additional support or counseling.  Chronic Skin Lesion on Leg   She has a chronic lesion on her leg that becomes hard and painful, and she has been cutting it out herself, which is not advisable. The lesion produces a white hard scab. A shave biopsy is recommended for proper assessment. Schedule a shave biopsy of the lesion and send the biopsy for pathological examination.  Medication Management   She requires refills for Xanax , Flexeril , Prozac , Mounjaro , and Valtrex . She is on the maximum dose of Mounjaro  and needs to call monthly for refills. Streamlining the refill process for Mounjaro  is necessary. Refill prescriptions for Xanax , Flexeril , Prozac , Mounjaro , and Valtrex , and address the issue of monthly calls for Mounjaro  refills.  General Health Maintenance   She inquired about monitoring her blood sugar levels, indicating interest in checking her A1c levels. Order A1c test.    Return if symptoms worsen or fail to improve.    Rhylynn Perdomo R Lowne Chase, DO

## 2024-03-14 ENCOUNTER — Other Ambulatory Visit: Payer: Self-pay

## 2024-03-14 ENCOUNTER — Other Ambulatory Visit (HOSPITAL_BASED_OUTPATIENT_CLINIC_OR_DEPARTMENT_OTHER): Payer: Self-pay

## 2024-03-14 LAB — COMPREHENSIVE METABOLIC PANEL WITH GFR
ALT: 12 U/L (ref 0–35)
AST: 11 U/L (ref 0–37)
Albumin: 4.3 g/dL (ref 3.5–5.2)
Alkaline Phosphatase: 130 U/L — ABNORMAL HIGH (ref 39–117)
BUN: 9 mg/dL (ref 6–23)
CO2: 26 meq/L (ref 19–32)
Calcium: 9.6 mg/dL (ref 8.4–10.5)
Chloride: 102 meq/L (ref 96–112)
Creatinine, Ser: 0.83 mg/dL (ref 0.40–1.20)
GFR: 86.67 mL/min (ref >60.00)
Glucose, Bld: 90 mg/dL (ref 70–99)
Potassium: 3.9 meq/L (ref 3.5–5.1)
Sodium: 138 meq/L (ref 135–145)
Total Bilirubin: 0.8 mg/dL (ref 0.2–1.2)
Total Protein: 8.4 g/dL — ABNORMAL HIGH (ref 6.0–8.3)

## 2024-03-14 LAB — LIPID PANEL
Cholesterol: 168 mg/dL (ref 0–200)
HDL: 49.7 mg/dL (ref >39.00)
LDL Cholesterol: 106 mg/dL — ABNORMAL HIGH (ref 0–99)
NonHDL: 118.22
Total CHOL/HDL Ratio: 3
Triglycerides: 63 mg/dL (ref 0.0–149.0)
VLDL: 12.6 mg/dL (ref 0.0–40.0)

## 2024-03-14 LAB — MICROALBUMIN / CREATININE URINE RATIO
Creatinine,U: 102.7 mg/dL
Microalb Creat Ratio: UNDETERMINED mg/g (ref 0.0–30.0)
Microalb, Ur: <0.7 mg/dL

## 2024-03-14 LAB — CBC WITH DIFFERENTIAL/PLATELET
Basophils Absolute: 0.1 10*3/uL (ref 0.0–0.1)
Basophils Relative: 0.4 % (ref 0.0–3.0)
Eosinophils Absolute: 0.1 10*3/uL (ref 0.0–0.7)
Eosinophils Relative: 0.5 % (ref 0.0–5.0)
HCT: 41.7 % (ref 36.0–46.0)
Hemoglobin: 13.2 g/dL (ref 12.0–15.0)
Lymphocytes Relative: 13.5 % (ref 12.0–46.0)
Lymphs Abs: 2.1 10*3/uL (ref 0.7–4.0)
MCHC: 31.6 g/dL (ref 30.0–36.0)
MCV: 71.8 fl — ABNORMAL LOW (ref 78.0–100.0)
Monocytes Absolute: 0.6 10*3/uL (ref 0.1–1.0)
Monocytes Relative: 4.1 % (ref 3.0–12.0)
Neutro Abs: 12.7 10*3/uL — ABNORMAL HIGH (ref 1.4–7.7)
Neutrophils Relative %: 81.5 % — ABNORMAL HIGH (ref 43.0–77.0)
Platelets: 331 10*3/uL (ref 150.0–400.0)
RBC: 5.81 Mil/uL — ABNORMAL HIGH (ref 3.87–5.11)
RDW: 16.7 % — ABNORMAL HIGH (ref 11.5–15.5)
WBC: 15.6 10*3/uL — ABNORMAL HIGH (ref 4.0–10.5)

## 2024-03-14 LAB — HEMOGLOBIN A1C: Hgb A1c MFr Bld: 5.7 % (ref 4.6–6.5)

## 2024-03-14 MED ORDER — DAPAGLIFLOZIN PROPANEDIOL 10 MG PO TABS
10.0000 mg | ORAL_TABLET | Freq: Every day | ORAL | 0 refills | Status: DC
  Filled 2024-03-14: qty 90, 90d supply, fill #0

## 2024-03-14 MED ORDER — SPIRONOLACTONE 25 MG PO TABS
25.0000 mg | ORAL_TABLET | Freq: Every day | ORAL | 0 refills | Status: DC
  Filled 2024-03-14: qty 90, 90d supply, fill #0

## 2024-03-16 ENCOUNTER — Ambulatory Visit: Payer: Self-pay | Admitting: Family Medicine

## 2024-03-17 ENCOUNTER — Other Ambulatory Visit (HOSPITAL_BASED_OUTPATIENT_CLINIC_OR_DEPARTMENT_OTHER): Payer: Self-pay

## 2024-03-18 ENCOUNTER — Other Ambulatory Visit (HOSPITAL_BASED_OUTPATIENT_CLINIC_OR_DEPARTMENT_OTHER): Payer: Self-pay

## 2024-03-24 ENCOUNTER — Telehealth (HOSPITAL_COMMUNITY): Payer: Self-pay | Admitting: *Deleted

## 2024-03-24 NOTE — Telephone Encounter (Signed)
 Called to confirm/remind patient of their appointment at the [facility] on 03/25/24.        Appointment:              [x] Confirmed             [] Left mess              [] No answer/No voice mail             [] Phone not in service   Patient reminded to bring all medications and/or complete list.   Confirmed patient has transportation. Gave directions, instructed to utilize valet parking.

## 2024-03-25 ENCOUNTER — Encounter (HOSPITAL_COMMUNITY): Payer: Self-pay

## 2024-03-25 ENCOUNTER — Other Ambulatory Visit (HOSPITAL_BASED_OUTPATIENT_CLINIC_OR_DEPARTMENT_OTHER): Payer: Self-pay

## 2024-03-25 ENCOUNTER — Ambulatory Visit (HOSPITAL_COMMUNITY)
Admission: RE | Admit: 2024-03-25 | Discharge: 2024-03-25 | Disposition: A | Discharge status: Home / Self Care | Source: Ambulatory Visit | Attending: Cardiology | Admitting: Cardiology

## 2024-03-25 ENCOUNTER — Other Ambulatory Visit: Payer: Self-pay

## 2024-03-25 DIAGNOSIS — I5022 Chronic systolic (congestive) heart failure: Secondary | ICD-10-CM | POA: Diagnosis not present

## 2024-03-25 DIAGNOSIS — J45909 Unspecified asthma, uncomplicated: Secondary | ICD-10-CM | POA: Insufficient documentation

## 2024-03-25 DIAGNOSIS — I110 Hypertensive heart disease with heart failure: Secondary | ICD-10-CM | POA: Diagnosis not present

## 2024-03-25 DIAGNOSIS — Z79899 Other long term (current) drug therapy: Secondary | ICD-10-CM | POA: Diagnosis not present

## 2024-03-25 DIAGNOSIS — E669 Obesity, unspecified: Secondary | ICD-10-CM | POA: Diagnosis not present

## 2024-03-25 DIAGNOSIS — G4733 Obstructive sleep apnea (adult) (pediatric): Secondary | ICD-10-CM | POA: Insufficient documentation

## 2024-03-25 DIAGNOSIS — E119 Type 2 diabetes mellitus without complications: Secondary | ICD-10-CM | POA: Insufficient documentation

## 2024-03-25 DIAGNOSIS — I5082 Biventricular heart failure: Secondary | ICD-10-CM | POA: Diagnosis not present

## 2024-03-25 DIAGNOSIS — Z6839 Body mass index (BMI) 39.0-39.9, adult: Secondary | ICD-10-CM | POA: Insufficient documentation

## 2024-03-25 DIAGNOSIS — Z7984 Long term (current) use of oral hypoglycemic drugs: Secondary | ICD-10-CM | POA: Diagnosis not present

## 2024-03-25 MED ORDER — CARVEDILOL 3.125 MG PO TABS
3.1250 mg | ORAL_TABLET | Freq: Two times a day (BID) | ORAL | 1 refills | Status: AC
  Filled 2024-03-25: qty 180, 90d supply, fill #0

## 2024-03-25 MED ORDER — POTASSIUM CHLORIDE CRYS ER 20 MEQ PO TBCR
40.0000 meq | EXTENDED_RELEASE_TABLET | Freq: Every day | ORAL | 1 refills | Status: AC
  Filled 2024-03-25: qty 180, 90d supply, fill #0

## 2024-03-25 MED ORDER — SPIRONOLACTONE 25 MG PO TABS
25.0000 mg | ORAL_TABLET | Freq: Every day | ORAL | 1 refills | Status: AC
  Filled 2024-03-25: qty 90, 90d supply, fill #0

## 2024-03-25 MED ORDER — DAPAGLIFLOZIN PROPANEDIOL 10 MG PO TABS
10.0000 mg | ORAL_TABLET | Freq: Every day | ORAL | 1 refills | Status: AC
  Filled 2024-03-25: qty 90, 90d supply, fill #0

## 2024-03-25 MED ORDER — SACUBITRIL-VALSARTAN 49-51 MG PO TABS
1.0000 | ORAL_TABLET | Freq: Two times a day (BID) | ORAL | 6 refills | Status: AC
  Filled 2024-03-25 – 2024-10-17 (×3): qty 60, 30d supply, fill #0

## 2024-03-25 MED ORDER — IVABRADINE HCL 5 MG PO TABS
2.5000 mg | ORAL_TABLET | Freq: Two times a day (BID) | ORAL | 6 refills | Status: AC
  Filled 2024-03-25: qty 30, 30d supply, fill #0

## 2024-03-25 NOTE — Progress Notes (Signed)
 [facility] Note   PCP: Antonio Cyndee Jamee JONELLE, [name] Primary Cardiologist: Kelli Scarce, [name]  HF Cardiologist: Dr. Cherrie, [name]  Reason for Visit: F/u for chronic systolic heart failure   HPI: [name] is a 43 y.o. female with a history of obesity, DM, HTN, asthma, OSA and severe systolic HF.  Echo (2010): EF 55-60%   Admitted 12/21 at [redacted] weeks pregnant, with first Kelli, with hypertension and lower extremity edema and found to have acute systolic heart failure. Echo completed and showed biventricular dysfunction with EF 15-20%. Co-ox consistent with cardiogenic shock.  Diuresed over 50 pounds and GDMT initiated   Echo 02/09/22 EF 45%. Sleep study normal.   Follow up 6/24, off most GDMT. NYHA II and volume OK. GDMT restarted, echo ordered to ensure EF stable.   Echo 7/24 EF 30-35%. Had not been consistently taking medications. Plan to repeat echo when back on GDMT  Echo 08/20/23, EF 40%, RV nl.  She presents back today for routine f/u. Needs refills. She feels well. Getting around ok. No major functional limitations w/ basic ADLs. No LEE, orthopnea or PND. Reports full med compliance. Tolerating ok. No significant SEs. Has occasional HAs but resolves w/ Tylenol . No GU SEs w/ Farxiga . BP well controlled, 120/88 prior to taking AM meds. She has IUD for [facility].   Labs were just completed at PCP last week. I personally reviewed them. SCr was 0.83. K 3.9.    Cardiac Studies - Echo (7/24): EF 30-35%   - Echo (02/09/22): EF 45%   - Echo (2/22): EF 30% mild to mod RV dysfunction   - Echo (08/24/20): Biventricular HF EF < 20%   FH: Dad has A FIb, Aunt heart failure.  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  Note: [name]   Past Medical History:  Diagnosis Date   Anemia    Asthma    Genital herpes    Gestational diabetes    Obesity    Pre-diabetes    Thyroid  disease    Current Outpatient Medications  Medication  Sig Dispense Refill   albuterol  (PROAIR  HFA) 108 (90 Base) MCG/ACT inhaler Inhale 2 puffs into the lungs every 6 (six) hours as needed. 6.7 g 1   ALPRAZolam  (XANAX ) 0.25 MG tablet Take 1 tablet (0.25 mg total) by mouth 3 (three) times daily as needed for sleep. 30 tablet 1   Blood Pressure Monitor MISC Use as directed to monitor blood pressure. 1 each 0   cyclobenzaprine  (FLEXERIL ) 10 MG tablet Take 1 tablet (10 mg total) by mouth 3 (three) times daily as needed for muscle spasms. 30 tablet 0   FLUoxetine  (PROZAC ) 20 MG tablet Take 3 tablets (60 mg total) by mouth daily. 90 tablet 3   furosemide  (LASIX ) 20 MG tablet Take 1 tablet (20 mg total) by mouth daily. 90 tablet 3   methocarbamol  (ROBAXIN ) 500 MG tablet Take 1 tablet (500 mg total) by mouth 3 (three) times a day as needed for muscle spasms. 30 tablet 0   tirzepatide  (MOUNJARO ) 15 MG/0.5ML Pen Inject 15 mg into the skin once a week. 6 mL 0   valACYclovir  (VALTREX ) 1000 MG tablet Take 1 tablet (1,000 mg total) by mouth daily. 90 tablet 0   Vitamin D , Ergocalciferol , (DRISDOL ) 1.25 MG (50000 UNIT) CAPS capsule Take 1 capsule (50,000 Units total) by mouth every 7 (seven) days. 12 capsule 1   carvedilol  (COREG ) 3.125 MG tablet Take  1 tablet (3.125 mg total) by mouth 2 (two) times daily. 180 tablet 1   dapagliflozin  propanediol (FARXIGA ) 10 MG TABS tablet Take 1 tablet (10 mg total) by mouth daily before breakfast. 90 tablet 1   FLUoxetine  (PROZAC ) 40 MG capsule Take 1 capsule (40 mg total) by mouth daily. (Patient not taking: Reported on 03/25/2024) 90 capsule 3   ivabradine  (CORLANOR ) 5 MG TABS tablet Take 0.5 tablets (2.5 mg total) by mouth 2 (two) times daily with a meal. 30 tablet 6   potassium chloride  SA (KLOR-CON  M) 20 MEQ tablet Take 2 tablets (40 mEq total) by mouth daily. 180 tablet 1   sacubitril -valsartan  (ENTRESTO ) 49-51 MG Take 1 tablet by mouth 2 (two) times daily. 60 tablet 6   spironolactone  (ALDACTONE ) 25 MG tablet Take 1 tablet  (25 mg total) by mouth daily. 90 tablet 1   No current facility-administered medications for this encounter.   Allergies  Allergen Reactions   Clomiphene  Hives    Hives and lips swelling.    Social History   Socioeconomic History   Marital status: Married    Spouse name: Not on file   Number of children: Not on file   Years of education: Not on file   Highest education level: Master's degree (e.g., MA, MS, MEng, MEd, MSW, MBA)  Occupational History   Occupation: [name]    Employer: [employer]  Tobacco Use   Smoking status: Never    Passive exposure: Never   Smokeless tobacco: Never  Vaping Use   Vaping status: Never Used  Substance and Sexual Activity   Alcohol use: Yes    Comment: SOCIAL    Drug use: No   Sexual activity: Yes    Partners: Male    Birth control/protection: None    Comment: 1st intercourse- 16, partners- 10, current partner- 4 yrs   Other Topics Concern   Not on file  Social History Narrative   Not on file   Social Drivers of Health   Financial Resource Strain: Low Risk  (03/12/2024)   Overall Financial Resource Strain (CARDIA)    Difficulty of Paying Living Expenses: Not hard at all  Food Insecurity: No Food Insecurity (03/12/2024)   Hunger Vital Sign    Worried About Running Out of Food in the Last Year: Never true    Ran Out of Food in the Last Year: Never true  Transportation Needs: No Transportation Needs (03/12/2024)   PRAPARE - Administrator, Civil Service (Medical): No    Lack of Transportation (Non-Medical): No  Physical Activity: Insufficiently Active (03/12/2024)   Exercise Vital Sign    Days of Exercise per Week: 1 day    Minutes of Exercise per Session: 10 min  Stress: Stress Concern Present (03/12/2024)   [name]    Feeling of Stress: Very much  Social Connections: Socially Integrated (03/12/2024)   Social Connection and Isolation Panel     Frequency of Communication with Friends and Family: More than three times a week    Frequency of Social Gatherings with Friends and Family: Twice a week    Attends Religious Services: More than 4 times per year    Active Member of [employer] or Organizations: Yes    Attends Banker Meetings: More than 4 times per year    Marital Status: Married  Intimate Partner Violence: Unknown (01/02/2022)   Received from [facility]   HITS    Physically  Hurt: Not on file    Insult or Talk Down To: Not on file    Threaten Physical Harm: Not on file    Scream or Curse: Not on file     Family History  Problem Relation Age of Onset   Hyperlipidemia Mother    Hypertension Mother    Depression Father    Hyperlipidemia Father    Heart disease Father 27       MI   Hypertension Father    Cancer Father 74       prostate   Cancer Paternal Grandfather        prostate   Coronary artery disease Other    Prostate cancer Other    Cancer Maternal Grandfather        prostate   BP 120/88   Pulse 74   Resp 16   Wt 115.8 kg (255 lb 3.2 oz)   LMP  (LMP Unknown)   SpO2 100%   BMI 39.97 kg/m   Wt Readings from Last 3 Encounters:  03/25/24 115.8 kg (255 lb 3.2 oz)  03/13/24 115.3 kg (254 lb 3.2 oz)  08/20/23 127 kg (280 lb)   PHYSICAL EXAM: General:  Well appearing, obese. No respiratory difficulty HEENT: normal Neck: supple. no JVD. Carotids 2+ bilat; no bruits. No lymphadenopathy or thyromegaly appreciated. Cor: PMI nondisplaced. Regular rate & rhythm. No rubs, gallops or murmurs. Lungs: clear Abdomen: soft, nontender, nondistended. No hepatosplenomegaly. No bruits or masses. Good bowel sounds. Extremities: no cyanosis, clubbing, rash, edema Neuro: alert & oriented x 3, cranial nerves grossly intact. moves all 4 extremities w/o difficulty. Affect pleasant.   ASSESSMENT & PLAN 1. Chronic Biventricular Heart Failure - Echo 12/21 severely reduced EF 15-20%.  - Possible HTN CM. -  C-section 12/21 without complications.  - Repeat Echo (11/12/20): EF 30% mild to mod RV dysfunction  - Echo (02/09/22): EF 45% - Echo 7/24: EF 30-35% - Echo  08/20/23, EF 40%, RV nl  - Stable NYHA Class II, confounded by obesity/deconditioning  - Volume assessment difficult based on body habitus but no LEE or JVD on exam   - Continue Lasix  20 mg daily  - Continue Coreg  3.125 mg bid  - Continue Farxiga  10 mg daily  - Continue spironolactone  25 mg daily  - Continue ivabradine  2.5 mg bid  - I personally reviewed recent BMP. SCr and K WNL  - Pt informed of  risks of recurrent HF with subsequent pregnancies and possible risks of maternal/fetal death, need for transplant etc. Pt strongly advised to avoid pregnancy. Continue IUD - EF out of range for ICD per recent echo   2. HTN - controlled on current regimen - GDMT per above    3. DM2 - controlled, recent Hgb A1c ws 5.7  - managed by PCP  - on Farxiga  + statin    4. OSA - Repeat sleep study showed resolution of OSA  5. Obesity - Body mass index is 39.97 kg/m. - She is now back on tirzepatide  (Mounjaro )   F/u in 6 months w/ [name] Caffie Shed, PA-C 03/25/24

## 2024-03-25 NOTE — Patient Instructions (Signed)
 There has been no changes to your medications.  Your physician recommends that you schedule a follow-up appointment in: 6 months ( January 2026) ** PLEASE CALL THE OFFICE IN NOVEMBER TO ARRANGE YOUR FOLLOW UP APPOINTMENT.**  If you have any questions or concerns before your next appointment please send us  a message through [place] or call our office at [phone].    TO LEAVE A MESSAGE FOR THE NURSE SELECT OPTION 2, PLEASE LEAVE A MESSAGE INCLUDING: YOUR NAME DATE OF BIRTH CALL BACK NUMBER REASON FOR CALL**this is important as we prioritize the call backs  YOU WILL RECEIVE A CALL BACK THE SAME DAY AS LONG AS YOU CALL BEFORE 4:00 PM  At the [facility], you and your health needs are our priority. As part of our continuing mission to provide you with exceptional heart care, we have created designated Provider Care Teams. These Care Teams include your primary Cardiologist (physician) and Advanced Practice Providers (APPs- Physician Assistants and Nurse Practitioners) who all work together to provide you with the care you need, when you need it.   You may see any of the following providers on your designated Care Team at your next follow up: Dr Toribio Fuel Dr Ezra Shuck Dr. Ria Commander Dr. Morene Brownie Amy Lenetta, NP Caffie Shed, [place] [facility] [place], [place] Beckey Coe, NP [place] Lee, NP Ellouise Class, NP Tinnie Redman, PharmD Jaun Bash, PharmD   Please be sure to bring in all your medications bottles to every appointment.    Thank you for choosing [facility]

## 2024-04-04 ENCOUNTER — Other Ambulatory Visit (HOSPITAL_BASED_OUTPATIENT_CLINIC_OR_DEPARTMENT_OTHER): Payer: Self-pay

## 2024-04-18 DIAGNOSIS — L910 Hypertrophic scar: Secondary | ICD-10-CM | POA: Diagnosis not present

## 2024-04-19 ENCOUNTER — Other Ambulatory Visit (HOSPITAL_BASED_OUTPATIENT_CLINIC_OR_DEPARTMENT_OTHER): Payer: Self-pay

## 2024-04-30 ENCOUNTER — Telehealth (HOSPITAL_COMMUNITY): Payer: Self-pay | Admitting: Pharmacy Technician

## 2024-04-30 ENCOUNTER — Other Ambulatory Visit (HOSPITAL_COMMUNITY): Payer: Self-pay

## 2024-04-30 NOTE — Telephone Encounter (Signed)
 [facility] Patient Advocate Encounter  Prior Authorization for Farxiga  has been approved.    PA# [id number] Effective dates: 03/31/24 through 04/30/25  Kelli JULIANNA Pa, CPhT

## 2024-04-30 NOTE — Telephone Encounter (Signed)
 Patient Advocate Encounter   Received notification from [employer] that prior authorization for Colanor is required.   PA submitted on CoverMyMeds Key [id number] Status is pending   Will continue to follow.

## 2024-04-30 NOTE — Telephone Encounter (Signed)
 Patient Advocate Encounter   Received notification from [employer] that prior authorization for Farxiga  is required.   PA submitted on CoverMyMeds Key [id number] Status is pending   Will continue to follow.

## 2024-04-30 NOTE — Telephone Encounter (Addendum)
 [facility] Patient Advocate Encounter  Prior Authorization for Ivabradine  has been approved.    PA# [id number] Effective dates: 03/31/24 through 04/30/25  Kelli JULIANNA Pa, CPhT

## 2024-05-21 ENCOUNTER — Other Ambulatory Visit: Payer: Self-pay

## 2024-05-21 ENCOUNTER — Other Ambulatory Visit (HOSPITAL_BASED_OUTPATIENT_CLINIC_OR_DEPARTMENT_OTHER): Payer: Self-pay

## 2024-05-23 ENCOUNTER — Other Ambulatory Visit (HOSPITAL_BASED_OUTPATIENT_CLINIC_OR_DEPARTMENT_OTHER): Payer: Self-pay

## 2024-07-09 ENCOUNTER — Other Ambulatory Visit: Payer: Self-pay | Admitting: Family Medicine

## 2024-07-09 ENCOUNTER — Other Ambulatory Visit (HOSPITAL_BASED_OUTPATIENT_CLINIC_OR_DEPARTMENT_OTHER): Payer: Self-pay

## 2024-07-09 DIAGNOSIS — A6000 Herpesviral infection of urogenital system, unspecified: Secondary | ICD-10-CM

## 2024-07-09 MED ORDER — MOUNJARO 15 MG/0.5ML ~~LOC~~ SOAJ
15.0000 mg | SUBCUTANEOUS | 0 refills | Status: AC
  Filled 2024-07-09: qty 2, 28d supply, fill #0
  Filled 2024-09-04: qty 2, 28d supply, fill #1
  Filled 2024-10-04 (×2): qty 2, 28d supply, fill #2

## 2024-07-21 ENCOUNTER — Encounter: Payer: Self-pay | Admitting: Radiology

## 2024-07-22 ENCOUNTER — Other Ambulatory Visit (HOSPITAL_COMMUNITY): Payer: Self-pay

## 2024-07-22 ENCOUNTER — Telehealth: Payer: Self-pay | Admitting: Pharmacy Technician

## 2024-07-22 NOTE — Telephone Encounter (Signed)
 Pharmacy Patient Advocate Encounter   Received notification from CoverMyMeds that prior authorization for Mounjaro  15MG /0.5ML auto-injectors  is due for renewal.   Insurance verification completed.   The patient is insured through [facility].  Action: PA required and submitted KEY/EOC/Request #: [id number] from 07/22/24 to 07/22/25

## 2024-07-25 DIAGNOSIS — L910 Hypertrophic scar: Secondary | ICD-10-CM | POA: Diagnosis not present

## 2024-08-22 DIAGNOSIS — L910 Hypertrophic scar: Secondary | ICD-10-CM | POA: Diagnosis not present

## 2024-08-22 DIAGNOSIS — K118 Other diseases of salivary glands: Secondary | ICD-10-CM | POA: Diagnosis not present

## 2024-08-27 DIAGNOSIS — K118 Other diseases of salivary glands: Secondary | ICD-10-CM | POA: Diagnosis not present

## 2024-08-27 DIAGNOSIS — E042 Nontoxic multinodular goiter: Secondary | ICD-10-CM | POA: Diagnosis not present

## 2024-09-04 ENCOUNTER — Other Ambulatory Visit (HOSPITAL_BASED_OUTPATIENT_CLINIC_OR_DEPARTMENT_OTHER): Payer: Self-pay

## 2024-09-10 ENCOUNTER — Other Ambulatory Visit (HOSPITAL_BASED_OUTPATIENT_CLINIC_OR_DEPARTMENT_OTHER): Payer: Self-pay

## 2024-09-12 ENCOUNTER — Encounter: Payer: Self-pay | Admitting: Family Medicine

## 2024-10-04 ENCOUNTER — Other Ambulatory Visit (HOSPITAL_BASED_OUTPATIENT_CLINIC_OR_DEPARTMENT_OTHER): Payer: Self-pay

## 2024-10-17 ENCOUNTER — Other Ambulatory Visit (HOSPITAL_BASED_OUTPATIENT_CLINIC_OR_DEPARTMENT_OTHER): Payer: Self-pay
